# Patient Record
Sex: Male | Born: 1945 | Race: White | Hispanic: No | Smoking: Former smoker
Health system: Southern US, Community
[De-identification: ages and names within clinical notes are randomized; demographics above are authoritative.]

## PROBLEM LIST (undated history)

## (undated) ENCOUNTER — Emergency Department (HOSPITAL_COMMUNITY): Admission: EM | Payer: Medicare Other | Source: Home / Self Care

## (undated) DIAGNOSIS — J9601 Acute respiratory failure with hypoxia: Secondary | ICD-10-CM

## (undated) DIAGNOSIS — H269 Unspecified cataract: Secondary | ICD-10-CM

## (undated) DIAGNOSIS — F419 Anxiety disorder, unspecified: Secondary | ICD-10-CM

## (undated) DIAGNOSIS — K8020 Calculus of gallbladder without cholecystitis without obstruction: Secondary | ICD-10-CM

## (undated) DIAGNOSIS — E079 Disorder of thyroid, unspecified: Secondary | ICD-10-CM

## (undated) DIAGNOSIS — I509 Heart failure, unspecified: Secondary | ICD-10-CM

## (undated) DIAGNOSIS — H409 Unspecified glaucoma: Secondary | ICD-10-CM

## (undated) DIAGNOSIS — Z7901 Long term (current) use of anticoagulants: Secondary | ICD-10-CM

## (undated) DIAGNOSIS — K4090 Unilateral inguinal hernia, without obstruction or gangrene, not specified as recurrent: Secondary | ICD-10-CM

## (undated) DIAGNOSIS — I4891 Unspecified atrial fibrillation: Secondary | ICD-10-CM

## (undated) DIAGNOSIS — F32A Depression, unspecified: Secondary | ICD-10-CM

## (undated) DIAGNOSIS — E039 Hypothyroidism, unspecified: Secondary | ICD-10-CM

## (undated) DIAGNOSIS — E162 Hypoglycemia, unspecified: Secondary | ICD-10-CM

## (undated) DIAGNOSIS — F0390 Unspecified dementia without behavioral disturbance: Secondary | ICD-10-CM

## (undated) DIAGNOSIS — I10 Essential (primary) hypertension: Secondary | ICD-10-CM

## (undated) DIAGNOSIS — F329 Major depressive disorder, single episode, unspecified: Secondary | ICD-10-CM

## (undated) DIAGNOSIS — G4730 Sleep apnea, unspecified: Secondary | ICD-10-CM

## (undated) HISTORY — DX: Calculus of gallbladder without cholecystitis without obstruction: K80.20

## (undated) HISTORY — DX: Unspecified cataract: H26.9

## (undated) HISTORY — DX: Unspecified glaucoma: H40.9

## (undated) HISTORY — PX: FINGER AMPUTATION: SHX636

## (undated) HISTORY — DX: Sleep apnea, unspecified: G47.30

## (undated) HISTORY — DX: Hypoglycemia, unspecified: E16.2

---

## 2000-03-22 ENCOUNTER — Emergency Department (HOSPITAL_COMMUNITY): Admission: EM | Admit: 2000-03-22 | Discharge: 2000-03-22 | Payer: Self-pay | Admitting: Emergency Medicine

## 2000-04-04 ENCOUNTER — Encounter: Admission: RE | Admit: 2000-04-04 | Discharge: 2000-04-04 | Payer: Self-pay | Admitting: Internal Medicine

## 2000-08-12 ENCOUNTER — Encounter: Admission: RE | Admit: 2000-08-12 | Discharge: 2000-08-12 | Payer: Self-pay | Admitting: Internal Medicine

## 2001-04-11 ENCOUNTER — Emergency Department (HOSPITAL_COMMUNITY): Admission: EM | Admit: 2001-04-11 | Discharge: 2001-04-11 | Payer: Self-pay | Admitting: Emergency Medicine

## 2001-07-07 ENCOUNTER — Encounter: Payer: Self-pay | Admitting: Emergency Medicine

## 2001-07-08 ENCOUNTER — Inpatient Hospital Stay (HOSPITAL_COMMUNITY): Admission: EM | Admit: 2001-07-08 | Discharge: 2001-07-09 | Payer: Self-pay | Admitting: Emergency Medicine

## 2006-10-06 ENCOUNTER — Emergency Department (HOSPITAL_COMMUNITY): Admission: EM | Admit: 2006-10-06 | Discharge: 2006-10-06 | Payer: Self-pay | Admitting: *Deleted

## 2010-01-19 ENCOUNTER — Emergency Department (HOSPITAL_COMMUNITY): Admission: EM | Admit: 2010-01-19 | Discharge: 2010-01-19 | Payer: Self-pay | Admitting: Emergency Medicine

## 2010-01-19 ENCOUNTER — Ambulatory Visit: Payer: Self-pay | Admitting: Surgery

## 2010-01-25 ENCOUNTER — Emergency Department (HOSPITAL_COMMUNITY): Admission: EM | Admit: 2010-01-25 | Discharge: 2010-01-26 | Payer: Self-pay | Admitting: Emergency Medicine

## 2010-10-04 LAB — URINALYSIS, ROUTINE W REFLEX MICROSCOPIC
Bilirubin Urine: NEGATIVE
Glucose, UA: NEGATIVE mg/dL
Ketones, ur: NEGATIVE mg/dL
Nitrite: NEGATIVE
Protein, ur: NEGATIVE mg/dL

## 2010-10-04 LAB — DIFFERENTIAL
Basophils Relative: 0 % (ref 0–1)
Eosinophils Absolute: 0.2 10*3/uL (ref 0.0–0.7)
Eosinophils Relative: 3 % (ref 0–5)
Neutrophils Relative %: 75 % (ref 43–77)

## 2010-10-04 LAB — CBC
HCT: 43.2 % (ref 39.0–52.0)
Hemoglobin: 14.8 g/dL (ref 13.0–17.0)
MCH: 32.4 pg (ref 26.0–34.0)
MCHC: 34.2 g/dL (ref 30.0–36.0)
MCV: 94.7 fL (ref 78.0–100.0)
Platelets: 167 10*3/uL (ref 150–400)
RBC: 4.57 MIL/uL (ref 4.22–5.81)
RDW: 12.9 % (ref 11.5–15.5)
WBC: 7.6 10*3/uL (ref 4.0–10.5)

## 2010-10-04 LAB — COMPREHENSIVE METABOLIC PANEL
ALT: 14 U/L (ref 0–53)
AST: 17 U/L (ref 0–37)
Alkaline Phosphatase: 76 U/L (ref 39–117)
CO2: 30 mEq/L (ref 19–32)
Chloride: 102 mEq/L (ref 96–112)
GFR calc non Af Amer: >60 mL/min (ref >60)
Glucose, Bld: 93 mg/dL (ref 70–99)
Potassium: 4.4 mEq/L (ref 3.5–5.1)
Sodium: 138 mEq/L (ref 135–145)

## 2010-10-04 LAB — D-DIMER, QUANTITATIVE: D-Dimer, Quant: 0.97 ug/mL-FEU — ABNORMAL HIGH (ref 0.00–0.48)

## 2011-08-15 ENCOUNTER — Encounter (HOSPITAL_COMMUNITY): Payer: Self-pay | Admitting: *Deleted

## 2011-08-15 ENCOUNTER — Emergency Department (HOSPITAL_COMMUNITY)
Admission: EM | Admit: 2011-08-15 | Discharge: 2011-08-15 | Disposition: A | Discharge status: Home / Self Care | Payer: Medicare Other | Attending: Emergency Medicine | Admitting: Emergency Medicine

## 2011-08-15 DIAGNOSIS — S81009A Unspecified open wound, unspecified knee, initial encounter: Secondary | ICD-10-CM | POA: Insufficient documentation

## 2011-08-15 DIAGNOSIS — W540XXA Bitten by dog, initial encounter: Secondary | ICD-10-CM | POA: Insufficient documentation

## 2011-08-15 DIAGNOSIS — Z79899 Other long term (current) drug therapy: Secondary | ICD-10-CM | POA: Insufficient documentation

## 2011-08-15 DIAGNOSIS — S91009A Unspecified open wound, unspecified ankle, initial encounter: Secondary | ICD-10-CM | POA: Insufficient documentation

## 2011-08-15 DIAGNOSIS — T148XXA Other injury of unspecified body region, initial encounter: Secondary | ICD-10-CM

## 2011-08-15 DIAGNOSIS — I10 Essential (primary) hypertension: Secondary | ICD-10-CM | POA: Insufficient documentation

## 2011-08-15 HISTORY — DX: Essential (primary) hypertension: I10

## 2011-08-15 NOTE — ED Notes (Signed)
The pt  Was bitten by a dog last Wednesday.  He has some redness ot this llt lower leg.

## 2011-08-15 NOTE — ED Notes (Signed)
The pt says he wants to talk to a psychiatrist. He has anxiety issues and he  Does not like hospitals.  He is afraid that he is going to die from the dog bite

## 2011-08-15 NOTE — ED Notes (Signed)
[employer], [phone]  Pt called and left a message for them to call him tomorrow

## 2011-08-15 NOTE — ED Provider Notes (Signed)
History     CSN: [id number]  Arrival date & time 08/15/11  1014   First MD Initiated Contact with Patient 08/15/11 1041      Chief Complaint  Patient presents with  . Animal Bite    (Consider location/radiation/quality/duration/timing/severity/associated sxs/prior treatment) Patient is a 66 y.o. male presenting with animal bite. The history is provided by the patient.  Animal Bite  Pertinent negatives include no numbness, no nausea, no vomiting, no headaches and no weakness.  pt states was at neighbors yard/junk yard 4 days ago. He opened fence and went in. Says one of the dogs there had puppies, as he approached animals/in yard, the dog bite him above left ankle. He has been smearing a concoction of 'clay, and honey' on wound that 'an amish guy gave me'. Says area is healing, has a couple small scabs to area, but no spreading redness, no purulent drainage, no pain, no fever. Says he knows the dogs owners, and location of dog. That dog has been acting normally/healthy. States initially there was some uncertainty as to whether dog had is shots, but now has been reassured by owner that dog has had its shots.  Pt states his last tetanus shot was 3 yrs ago.   Past Medical History  Diagnosis Date  . Hypertension   . Anxiety     History reviewed. No pertinent past surgical history.  No family history on file.  History  Substance Use Topics  . Smoking status: Current Everyday Smoker  . Smokeless tobacco: Not on file  . Alcohol Use: No      Review of Systems  Constitutional: Negative for fever and chills.  Gastrointestinal: Negative for nausea and vomiting.  Musculoskeletal: Negative for myalgias.  Skin: Negative for rash.  Neurological: Negative for weakness, numbness and headaches.    Allergies  Review of patient's allergies indicates no known allergies.  Home Medications   Current Outpatient Rx  Name Route Sig Dispense Refill  . AMLODIPINE BESYLATE 5 MG PO TABS Oral  Take 5 mg by mouth daily.    Marland Kitchen DIAZEPAM 5 MG PO TABS Oral Take 5 mg by mouth every 6 (six) hours as needed.    Marland Kitchen LEVOTHYROXINE SODIUM 137 MCG PO TABS Oral Take 137 mcg by mouth daily.    Marland Kitchen PHENELZINE SULFATE 15 MG PO TABS Oral Take 15 mg by mouth 3 (three) times daily.      BP 135/86  Pulse 109  Temp(Src) 98.1 F (36.7 C) (Oral)  Resp 20  SpO2 94%  Physical Exam  Nursing note and vitals reviewed. Constitutional: He is oriented to person, place, and time. He appears well-developed and well-nourished. No distress.  HENT:  Head: Atraumatic.  Eyes: Pupils are equal, round, and reactive to light.  Neck: Neck supple. No tracheal deviation present.  Cardiovascular: Normal rate.   Pulmonary/Chest: Effort normal. No accessory muscle usage. No respiratory distress.  Musculoskeletal: Normal range of motion. He exhibits no edema.       Couple small (2-3 mm diameter each) scabs to right left lower leg just above ankle posteriorly. No purulent drainage. No cellulitis. No tenderness to area. No sts.   Neurological: He is alert and oriented to person, place, and time.       Motor intact. Steady gait  Skin: Skin is warm and dry.  Psychiatric: He has a normal mood and affect.    ED Course  Procedures (including critical care time)     MDM  Pt states he will  contact animal control this morning, that the location of dog and owner are known. Also states animal has been healthy/acting normally.  4 days after bite, no sign of infection to bite site, therefore will hold on abx rx for now. Discussed signs of infxn w pt.          Suzi Roots, MD 08/15/11 1116

## 2011-08-16 ENCOUNTER — Ambulatory Visit (HOSPITAL_COMMUNITY): Admission: RE | Admit: 2011-08-16 | Payer: Medicare Other | Source: Home / Self Care | Admitting: Psychiatry

## 2011-08-17 ENCOUNTER — Encounter (HOSPITAL_COMMUNITY): Payer: Self-pay | Admitting: *Deleted

## 2011-08-17 ENCOUNTER — Emergency Department (HOSPITAL_COMMUNITY)
Admission: EM | Admit: 2011-08-17 | Discharge: 2011-08-18 | Disposition: A | Discharge status: Home / Self Care | Payer: Medicare Other | Attending: Emergency Medicine | Admitting: Emergency Medicine

## 2011-08-17 DIAGNOSIS — F411 Generalized anxiety disorder: Secondary | ICD-10-CM | POA: Insufficient documentation

## 2011-08-17 DIAGNOSIS — F3289 Other specified depressive episodes: Secondary | ICD-10-CM | POA: Insufficient documentation

## 2011-08-17 DIAGNOSIS — T148XXA Other injury of unspecified body region, initial encounter: Secondary | ICD-10-CM

## 2011-08-17 DIAGNOSIS — F329 Major depressive disorder, single episode, unspecified: Secondary | ICD-10-CM | POA: Insufficient documentation

## 2011-08-17 DIAGNOSIS — W540XXA Bitten by dog, initial encounter: Secondary | ICD-10-CM | POA: Insufficient documentation

## 2011-08-17 DIAGNOSIS — S81009A Unspecified open wound, unspecified knee, initial encounter: Secondary | ICD-10-CM | POA: Insufficient documentation

## 2011-08-17 DIAGNOSIS — S91009A Unspecified open wound, unspecified ankle, initial encounter: Secondary | ICD-10-CM | POA: Insufficient documentation

## 2011-08-17 DIAGNOSIS — I10 Essential (primary) hypertension: Secondary | ICD-10-CM | POA: Insufficient documentation

## 2011-08-17 DIAGNOSIS — Z008 Encounter for other general examination: Secondary | ICD-10-CM | POA: Insufficient documentation

## 2011-08-17 HISTORY — DX: Anxiety disorder, unspecified: F41.9

## 2011-08-17 HISTORY — DX: Major depressive disorder, single episode, unspecified: F32.9

## 2011-08-17 HISTORY — DX: Depression, unspecified: F32.A

## 2011-08-17 LAB — RAPID URINE DRUG SCREEN, HOSP PERFORMED
Amphetamines: NOT DETECTED
Barbiturates: NOT DETECTED
Benzodiazepines: POSITIVE — AB
Cocaine: NOT DETECTED
Opiates: NOT DETECTED
Tetrahydrocannabinol: NOT DETECTED

## 2011-08-17 LAB — URINALYSIS, ROUTINE W REFLEX MICROSCOPIC
Bilirubin Urine: NEGATIVE
Glucose, UA: NEGATIVE mg/dL
Hgb urine dipstick: NEGATIVE
Ketones, ur: NEGATIVE mg/dL
Leukocytes, UA: NEGATIVE
Nitrite: NEGATIVE
Protein, ur: NEGATIVE mg/dL
Specific Gravity, Urine: 1.014 (ref 1.005–1.030)
Urobilinogen, UA: 0.2 mg/dL (ref 0.0–1.0)
pH: 5.5 (ref 5.0–8.0)

## 2011-08-17 LAB — COMPREHENSIVE METABOLIC PANEL
ALT: 15 U/L (ref 0–53)
AST: 22 U/L (ref 0–37)
Albumin: 4 g/dL (ref 3.5–5.2)
Alkaline Phosphatase: 79 U/L (ref 39–117)
BUN: 13 mg/dL (ref 6–23)
CO2: 28 mEq/L (ref 19–32)
Calcium: 9.3 mg/dL (ref 8.4–10.5)
Chloride: 97 mEq/L (ref 96–112)
Creatinine, Ser: 0.92 mg/dL (ref 0.50–1.35)
GFR calc Af Amer: >90 mL/min (ref >90)
GFR calc non Af Amer: 87 mL/min — ABNORMAL LOW (ref >90)
Glucose, Bld: 93 mg/dL (ref 70–99)
Potassium: 3.8 mEq/L (ref 3.5–5.1)
Sodium: 134 mEq/L — ABNORMAL LOW (ref 135–145)
Total Bilirubin: 0.7 mg/dL (ref 0.3–1.2)
Total Protein: 7.3 g/dL (ref 6.0–8.3)

## 2011-08-17 LAB — DIFFERENTIAL
Basophils Absolute: 0 10*3/uL (ref 0.0–0.1)
Basophils Relative: 0 % (ref 0–1)
Eosinophils Absolute: 0.1 10*3/uL (ref 0.0–0.7)
Eosinophils Relative: 2 % (ref 0–5)
Lymphocytes Relative: 19 % (ref 12–46)
Lymphs Abs: 1.3 10*3/uL (ref 0.7–4.0)
Monocytes Absolute: 0.7 10*3/uL (ref 0.1–1.0)
Monocytes Relative: 10 % (ref 3–12)
Neutro Abs: 4.8 10*3/uL (ref 1.7–7.7)
Neutrophils Relative %: 70 % (ref 43–77)

## 2011-08-17 LAB — CBC
HCT: 41.4 % (ref 39.0–52.0)
Hemoglobin: 14 g/dL (ref 13.0–17.0)
MCH: 30 pg (ref 26.0–34.0)
MCHC: 33.8 g/dL (ref 30.0–36.0)
MCV: 88.7 fL (ref 78.0–100.0)
Platelets: 169 10*3/uL (ref 150–400)
RBC: 4.67 MIL/uL (ref 4.22–5.81)
RDW: 12.5 % (ref 11.5–15.5)
WBC: 6.8 10*3/uL (ref 4.0–10.5)

## 2011-08-17 LAB — ETHANOL: Alcohol, Ethyl (B): <11 mg/dL (ref 0–11)

## 2011-08-17 MED ORDER — AMOXICILLIN-POT CLAVULANATE 875-125 MG PO TABS
1.0000 | ORAL_TABLET | Freq: Once | ORAL | Status: AC
  Administered 2011-08-17: 1 via ORAL
  Filled 2011-08-17: qty 1

## 2011-08-17 NOTE — ED Notes (Signed)
Pt states, "I wonder if I am dying. I can't feel my pulse. I just need some help."

## 2011-08-17 NOTE — ED Notes (Signed)
Pt dressing on left lower leg where he states he was bitten by a dog, states that "an Amish man has been putting stuff on it every day" - pt states he does not know what the substance is

## 2011-08-17 NOTE — ED Notes (Signed)
The pt has multiple complaints.  The pt has been seen here every day at one of our facilities  For the past 4 days.   .  Numbness all over his body after a dog bite 4 days ago and he is not sleeping etc..  He says he has anxiety

## 2011-08-17 NOTE — ED Notes (Signed)
Pt denies HI/SI.

## 2011-08-18 MED ORDER — BACITRACIN 500 UNIT/GM EX OINT
1.0000 "application " | TOPICAL_OINTMENT | CUTANEOUS | Status: AC
  Administered 2011-08-18: 1 via TOPICAL
  Filled 2011-08-18: qty 0.9

## 2011-08-18 MED ORDER — AMOXICILLIN-POT CLAVULANATE 875-125 MG PO TABS: 1.0000 | ORAL_TABLET | Freq: Two times a day (BID) | ORAL | Status: AC

## 2011-08-18 NOTE — ED Notes (Signed)
ACT team speaking with pt 
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
## 2011-08-22 NOTE — ED Provider Notes (Signed)
History     CSN: [id number]  Arrival date & time 08/17/11  1550   First MD Initiated Contact with Patient 08/17/11 2025      Chief Complaint  Patient presents with  . Psychiatric Evaluation    HPI: Patient is a 66 y.o. male presenting with mental health disorder. The history is provided by the patient.  Mental Health Problem The primary symptoms do not include dysphoric mood, delusions or hallucinations. The current episode started this week. This is a recurrent problem.  The onset of the illness is precipitated by a stressful event. The degree of incapacity that he is experiencing as a consequence of his illness is moderate. Additional symptoms of the illness do not include no insomnia. He does not admit to suicidal ideas. He does not have a plan to commit suicide. He does not contemplate harming himself. He has not already injured self. He does not contemplate injuring another person. He has not already  injured another person. Risk factors that are present for mental illness include a history of mental illness.  Pt reports that he was bitten by a dog 4 days ago. Since that time he has been very anxious and concerned that he was going to die. States he did not get an antibiotic for the bite or rabies "shots'. States he is fearful that the owners of the dogs are angry w/ him for "turning in" the dog. States he is fearful of being alone, afraid he might die. States he will not stay home alone because he is so fearful. Pt states he needs help. States he is very anxious and needs to talk to someone. Denies SI/HI.  Past Medical History  Diagnosis Date  . Hypertension   . Anxiety   . Anxiety and depression     History reviewed. No pertinent past surgical history.  History reviewed. No pertinent family history.  History  Substance Use Topics  . Smoking status: Current Everyday Smoker  . Smokeless tobacco: Not on file  . Alcohol Use: No      Review of Systems  Constitutional:  Negative.   HENT: Negative.   Eyes: Negative.   Respiratory: Negative.   Cardiovascular: Negative.   Gastrointestinal: Negative.   Genitourinary: Negative.   Musculoskeletal: Negative.   Skin: Negative.   Neurological: Negative.   Hematological: Negative.   Psychiatric/Behavioral: Negative.  Negative for hallucinations and dysphoric mood. The patient does not have insomnia.     Allergies  Review of patient's allergies indicates no known allergies.  Home Medications   Current Outpatient Rx  Name Route Sig Dispense Refill  . AMLODIPINE BESYLATE 5 MG PO TABS Oral Take 5 mg by mouth daily.    Marland Kitchen BIMATOPROST 0.01 % OP SOLN Both Eyes Place 1 drop into both eyes at bedtime.    Marland Kitchen DIAZEPAM 5 MG PO TABS Oral Take 5 mg by mouth every 6 (six) hours as needed. For anxiety    . LEVOTHYROXINE SODIUM 137 MCG PO TABS Oral Take 137 mcg by mouth daily.    Marland Kitchen PHENELZINE SULFATE 15 MG PO TABS Oral Take 30 mg by mouth 3 (three) times daily.     . AMOXICILLIN-POT CLAVULANATE 875-125 MG PO TABS Oral Take 1 tablet by mouth 2 (two) times daily. 14 tablet 0    BP 119/83  Pulse 78  Temp(Src) 97.8 F (36.6 C) (Oral)  Resp 18  SpO2 91%  Physical Exam  Constitutional: He appears well-developed and well-nourished.  HENT:  Head: Normocephalic and  atraumatic.  Eyes: Conjunctivae are normal.  Neck: Neck supple.  Cardiovascular: Normal rate and regular rhythm.   Pulmonary/Chest: Effort normal and breath sounds normal.  Musculoskeletal: Normal range of motion.  Neurological: He is alert.  Skin: Skin is warm and dry.       Multiple (3 to4) puncture wounds to posterior (L) ankle, each w/ mildly erythematous borders and swelling. TTP w/o drainage.  Psychiatric: His mood appears anxious. His speech is tangential. He expresses no suicidal plans and no homicidal plans. He exhibits abnormal recent memory. He exhibits normal remote memory.       Tearful at times.    ED Course  Procedures   Will proceed w/  medical clearance and consult Act Team regarding pt evaluation. Review of the records and pt statement supports that   2245: Pt is medically cleared. Will start on Augmentin for mild inflammatory changes of wounds to (L) posterior ankle s/p dogbite. Will consult w/ Act Team regarding pt eval.  0130:  Marcus w/ Act Team reports he has evaluated pt and feels pt does not meet criteria for inpt placement. Additionally he states pt was not agreeable w/ a plan that places him where he can not come and go at will. After some discussion pt has agreed to contact [facility] tomorrow to arrange "intensive"  outpatient therapy.   Labs Reviewed  COMPREHENSIVE METABOLIC PANEL - Abnormal; Notable for the following:    Sodium 134 (*)    GFR calc non Af Amer 87 (*)    All other components within normal limits  URINE RAPID DRUG SCREEN (HOSP PERFORMED) - Abnormal; Notable for the following:    Benzodiazepines POSITIVE (*)    All other components within normal limits  CBC  DIFFERENTIAL  URINALYSIS, ROUTINE W REFLEX MICROSCOPIC  ETHANOL  LAB REPORT - SCANNED   No results found.   1. Animal bite       MDM  HPI/PE and clinical findings support medical clearance for evaluation for 1.  Anxiety/panic/depression (Denies SI/HI) 2.  Early wound infection          Leanne Chang, NP 08/22/11 [phone]

## 2011-08-27 NOTE — ED Provider Notes (Signed)
Medical screening examination/treatment/procedure(s) were performed by non-physician practitioner and as supervising physician I was immediately available for consultation/collaboration.  Marthena Whitmyer, MD 08/27/11 0103 

## 2012-07-03 ENCOUNTER — Other Ambulatory Visit (HOSPITAL_COMMUNITY): Payer: Self-pay | Admitting: Family Medicine

## 2012-07-03 ENCOUNTER — Ambulatory Visit (HOSPITAL_COMMUNITY)
Admission: RE | Admit: 2012-07-03 | Discharge: 2012-07-03 | Disposition: A | Discharge status: Home / Self Care | Payer: Medicare Other | Source: Ambulatory Visit | Attending: Family Medicine | Admitting: Family Medicine

## 2012-07-03 DIAGNOSIS — M7989 Other specified soft tissue disorders: Secondary | ICD-10-CM

## 2012-07-03 DIAGNOSIS — R600 Localized edema: Secondary | ICD-10-CM

## 2012-07-03 NOTE — Progress Notes (Signed)
VASCULAR LAB PRELIMINARY  PRELIMINARY  PRELIMINARY  PRELIMINARY   [name], Eric  01-22-1946   Right lower extremity venous duplex completed.    Preliminary report:  Right:  No evidence of DVT, superficial thrombosis, or Baker's cyst.  Soila Printup, RVT 07/03/2012, 3:38 PM

## 2012-08-06 ENCOUNTER — Encounter (HOSPITAL_COMMUNITY): Payer: Self-pay | Admitting: Emergency Medicine

## 2012-08-06 DIAGNOSIS — M7989 Other specified soft tissue disorders: Secondary | ICD-10-CM | POA: Insufficient documentation

## 2012-08-06 DIAGNOSIS — Z79899 Other long term (current) drug therapy: Secondary | ICD-10-CM | POA: Insufficient documentation

## 2012-08-06 DIAGNOSIS — F341 Dysthymic disorder: Secondary | ICD-10-CM | POA: Insufficient documentation

## 2012-08-06 DIAGNOSIS — M79609 Pain in unspecified limb: Secondary | ICD-10-CM | POA: Insufficient documentation

## 2012-08-06 DIAGNOSIS — I10 Essential (primary) hypertension: Secondary | ICD-10-CM | POA: Insufficient documentation

## 2012-08-06 DIAGNOSIS — Z87891 Personal history of nicotine dependence: Secondary | ICD-10-CM | POA: Insufficient documentation

## 2012-08-06 NOTE — ED Notes (Signed)
C/o redness, swelling, and pain to R lower leg x 2-3 weeks.  Pt states he recently had test for R leg DVT at [facility] and it was negative.  Denies sob.  Ambulatory to triage.

## 2012-08-07 ENCOUNTER — Emergency Department (HOSPITAL_COMMUNITY)
Admission: EM | Admit: 2012-08-07 | Discharge: 2012-08-07 | Discharge status: Against Medical Advice | Payer: Medicare Other | Attending: Emergency Medicine | Admitting: Emergency Medicine

## 2012-08-07 DIAGNOSIS — M7989 Other specified soft tissue disorders: Secondary | ICD-10-CM

## 2012-08-07 NOTE — ED Provider Notes (Signed)
History     CSN: [id number]  Arrival date & time 08/06/12  2317   First MD Initiated Contact with Patient 08/07/12 0033      Chief Complaint  Patient presents with  . Leg Swelling    (Consider location/radiation/quality/duration/timing/severity/associated sxs/prior treatment) HPI Comments: Patient presents with leg swelling, on my evaluation the patient refuses to take off his pants, states that he is tired of waiting and wants to go home. He refuses to let me examine him.   Past Medical History  Diagnosis Date  . Hypertension   . Anxiety   . Anxiety and depression     History reviewed. No pertinent past surgical history.  No family history on file.  History  Substance Use Topics  . Smoking status: Former Games developer  . Smokeless tobacco: Former Neurosurgeon    Quit date: 10/22/2010  . Alcohol Use: No      Review of Systems  Musculoskeletal:       Leg swelling    Allergies  Demerol  Home Medications   Current Outpatient Rx  Name  Route  Sig  Dispense  Refill  . AMLODIPINE BESYLATE 5 MG PO TABS   Oral   Take 5 mg by mouth daily.         Marland Kitchen BIMATOPROST 0.01 % OP SOLN   Both Eyes   Place 1 drop into both eyes at bedtime.         Marland Kitchen DIAZEPAM 5 MG PO TABS   Oral   Take 5 mg by mouth every 6 (six) hours as needed. For anxiety         . DOXYCYCLINE HYCLATE 100 MG PO TABS   Oral   Take 100 mg by mouth 2 (two) times daily.         . FUROSEMIDE 20 MG PO TABS   Oral   Take 2,040 mg by mouth 2 (two) times daily as needed. For edema         . HYDROCODONE-ACETAMINOPHEN 7.5-325 MG PO TABS   Oral   Take 1 tablet by mouth every 6 (six) hours as needed. For pain         . LEVOTHYROXINE SODIUM 137 MCG PO TABS   Oral   Take 137 mcg by mouth daily.         Marland Kitchen PHENELZINE SULFATE 15 MG PO TABS   Oral   Take 30 mg by mouth 3 (three) times daily.            BP 144/88  Pulse 114  Temp 98.7 F (37.1 C) (Oral)  Resp 18  SpO2 92%  Physical Exam    Constitutional: He appears well-developed and well-nourished. No distress.  HENT:  Head: Normocephalic and atraumatic.    ED Course  Procedures (including critical care time)  Labs Reviewed - No data to display No results found.   1. Leg swelling       MDM  The patient decided to leave before I could perform a physical exam. He did not appear in distress, was able to ambulate without difficulty. Review of his vital signs revealed that he had a slight hypoxia as well as a mild tachycardia. I informed the patient that he needed to be further evaluated for other potentially dangerous conditions such as DVT and pulmonary embolism but the patient refused this evaluation. He seemed to have a clear mental status was able to make that decision. The patient and decision making capacity, left AGAINST MEDICAL ADVICE.  Vida Roller, MD 08/07/12 [phone]

## 2012-08-23 ENCOUNTER — Encounter (HOSPITAL_COMMUNITY): Payer: Self-pay | Admitting: *Deleted

## 2012-08-23 ENCOUNTER — Emergency Department (HOSPITAL_COMMUNITY)
Admission: EM | Admit: 2012-08-23 | Discharge: 2012-08-23 | Discharge status: Against Medical Advice | Payer: Medicare Other | Attending: Emergency Medicine | Admitting: Emergency Medicine

## 2012-08-23 DIAGNOSIS — R000 Tachycardia, unspecified: Secondary | ICD-10-CM | POA: Insufficient documentation

## 2012-08-23 DIAGNOSIS — I10 Essential (primary) hypertension: Secondary | ICD-10-CM | POA: Insufficient documentation

## 2012-08-23 DIAGNOSIS — R0602 Shortness of breath: Secondary | ICD-10-CM | POA: Insufficient documentation

## 2012-08-23 DIAGNOSIS — Z4789 Encounter for other orthopedic aftercare: Secondary | ICD-10-CM

## 2012-08-23 DIAGNOSIS — F329 Major depressive disorder, single episode, unspecified: Secondary | ICD-10-CM | POA: Insufficient documentation

## 2012-08-23 DIAGNOSIS — F600 Paranoid personality disorder: Secondary | ICD-10-CM | POA: Insufficient documentation

## 2012-08-23 DIAGNOSIS — R071 Chest pain on breathing: Secondary | ICD-10-CM | POA: Insufficient documentation

## 2012-08-23 DIAGNOSIS — Z87891 Personal history of nicotine dependence: Secondary | ICD-10-CM | POA: Insufficient documentation

## 2012-08-23 DIAGNOSIS — F3289 Other specified depressive episodes: Secondary | ICD-10-CM | POA: Insufficient documentation

## 2012-08-23 DIAGNOSIS — F411 Generalized anxiety disorder: Secondary | ICD-10-CM | POA: Insufficient documentation

## 2012-08-23 DIAGNOSIS — Z4689 Encounter for fitting and adjustment of other specified devices: Secondary | ICD-10-CM | POA: Insufficient documentation

## 2012-08-23 DIAGNOSIS — R0789 Other chest pain: Secondary | ICD-10-CM

## 2012-08-23 LAB — GLUCOSE, CAPILLARY: Glucose-Capillary: 171 mg/dL — ABNORMAL HIGH (ref 70–99)

## 2012-08-23 NOTE — ED Notes (Signed)
Pt stating he wants to leave. Pt encouraged to stay for complete evaluation. Pt informed of the delay and again encouraged to stay. Pt continued to deny any further treatment. Dr. Dia Crawford notified of pt wanting to leave AMA. Pt verbally stated he understood the risk for leaving AMA. Pt was read AMA disclosure and e-signed AMA discharge.

## 2012-08-23 NOTE — ED Notes (Signed)
Pt is here anxious and saying blood sugar is low and diaphoretic.  Blood sugar is 171.  Pt states had cast placed to right arm today and now with severe pain to right arm and has good cap refill but has discoloration to hand.  Pt is paranoid.  Pt denies short of breath but states he has panic attacks.

## 2012-08-23 NOTE — ED Provider Notes (Signed)
History     CSN: [id number]  Arrival date & time 08/23/12  1910   First MD Initiated Contact with Patient 08/23/12 1933      Chief Complaint  Patient presents with  . Hypoglycemia  . cast tight on right arm     (Consider location/radiation/quality/duration/timing/severity/associated sxs/prior treatment) HPI Comments: 67 y/o male p/w concerns of right cast pain. S/p fall about one week ago. Initially in splint. Placed in cast by orthopedics about 12 hours ago. Patient states he feels the cast is to tight. Feels very anxious about this.   Patient is a 67 y.o. male presenting with general illness. The history is provided by the patient.  Illness  The current episode started today. The onset was gradual. The problem occurs continuously. The problem has been gradually worsening. The problem is moderate. Nothing relieves the symptoms. Nothing aggravates the symptoms. Pertinent negatives include no fever, no abdominal pain, no diarrhea, no nausea, no vomiting, no congestion, no headaches, no rhinorrhea, no cough, no rash and no eye pain.    Past Medical History  Diagnosis Date  . Hypertension   . Anxiety   . Anxiety and depression     History reviewed. No pertinent past surgical history.  No family history on file.  History  Substance Use Topics  . Smoking status: Former Games developer  . Smokeless tobacco: Former Neurosurgeon    Quit date: 10/22/2010  . Alcohol Use: No      Review of Systems  Constitutional: Negative for fever and chills.  HENT: Negative for congestion and rhinorrhea.   Eyes: Negative for pain and visual disturbance.  Respiratory: Positive for shortness of breath (mild, since fall). Negative for cough.   Cardiovascular: Positive for chest pain (right chest wall pain since fall). Negative for leg swelling.  Gastrointestinal: Negative for nausea, vomiting, abdominal pain and diarrhea.  Genitourinary: Negative for flank pain and difficulty urinating.  Musculoskeletal:  Negative for back pain.  Skin: Negative for color change and rash.  Neurological: Negative for dizziness and headaches.  All other systems reviewed and are negative.    Allergies  Demerol; Epinephrine; and Morphine and related  Home Medications   Current Outpatient Rx  Name  Route  Sig  Dispense  Refill  . AMLODIPINE BESYLATE 5 MG PO TABS   Oral   Take 5 mg by mouth daily.         Marland Kitchen BIMATOPROST 0.01 % OP SOLN   Both Eyes   Place 1 drop into both eyes at bedtime.         Marland Kitchen VITAMIN B 12 PO   Oral   Take 1 tablet by mouth daily.         Marland Kitchen DIAZEPAM 5 MG PO TABS   Oral   Take 5 mg by mouth every 6 (six) hours as needed. For anxiety         . FUROSEMIDE 20 MG PO TABS   Oral   Take 40-80 mg by mouth 2 (two) times daily as needed. For edema         . HYDROCODONE-ACETAMINOPHEN 7.5-325 MG PO TABS   Oral   Take 1 tablet by mouth every 6 (six) hours as needed. For pain         . LEVOTHYROXINE SODIUM 137 MCG PO TABS   Oral   Take 137 mcg by mouth daily.         Marland Kitchen PHENELZINE SULFATE 15 MG PO TABS   Oral   Take 30  mg by mouth 3 (three) times daily.            BP 156/75  Pulse 130  Temp 98.3 F (36.8 C)  Resp 26  SpO2 95%  Physical Exam  Nursing note and vitals reviewed. Constitutional: He is oriented to person, place, and time. He appears well-developed and well-nourished. No distress.  HENT:  Head: Normocephalic and atraumatic.  Eyes: Conjunctivae normal and EOM are normal. Pupils are equal, round, and reactive to light. Right eye exhibits no discharge. Left eye exhibits no discharge.  Neck: No tracheal deviation present.  Cardiovascular: Regular rhythm, normal heart sounds and intact distal pulses.        tachycardic  Pulmonary/Chest: Effort normal and breath sounds normal. No stridor. No respiratory distress. He has no wheezes. He has no rales. He exhibits tenderness (right lateral chest wall).  Abdominal: Soft. He exhibits no distension. There is  no tenderness. There is no guarding.  Musculoskeletal: He exhibits no edema and no tenderness.       Cast to right forearm. Some duskiness to fingers distally. However, similar to color of fingers on left hand. Bruising to knuckles on right. Good cap refill. Sensation intact. Motor intact. Able to put one finger underneath cast.  Neurological: He is alert and oriented to person, place, and time.  Skin: Skin is warm and dry.  Psychiatric: He has a normal mood and affect. His behavior is normal.    ED Course  Procedures (including critical care time)  Labs Reviewed  GLUCOSE, CAPILLARY - Abnormal; Notable for the following:    Glucose-Capillary 171 (*)     All other components within normal limits   No results found.   1. Cast discomfort   2. Tachycardia   3. Chest wall pain   4. SOB (shortness of breath)      Date: 08/23/2012  Rate: 115  Rhythm: sinus tachycardia  QRS Axis: normal  Intervals: normal  ST/T Wave abnormalities: nonspecific T wave changes  Conduction Disutrbances:none  Narrative Interpretation:   Old EKG Reviewed: changes noted. New tachycardia.    MDM    67 y/o male p/w cast problem. Motor and sensation intact. Equal color to b/l hands. Plan for plain films and change of cast. Patient also with some SOB and chest wall pain since fall. Tachycardic. Discussed concern over symptoms and tachycardia. Patient states we are not doing anything to help him. Adamant that he wants to leave. Discussed desire to further evaluate and treat patient's symptoms. Refusing to stay. Signed AMA paperwork.  Labs and imaging reviewed by myself and considered in medical decision making if ordered. Imaging interpreted by radiology.   Discussed case with Dr. Rubin Payor who is in agreement with assessment and plan.         Stevie Kern, MD 08/24/12 [phone]

## 2012-08-24 NOTE — ED Provider Notes (Signed)
Patient with tight cast on arm. Reportedly was placed at [facility] today. States it is pain is gotten more bruised he doesn't like the cast. States it is too tight. He also states she's been having trouble breathing. He also states he's had cut on his head. After patient was told of the workup patient did not want to stay. He was also told that we would still loosen the cast. His did not want to stay for that either. He was informed of the risks and left AMA  Juliet Rude. Rubin Payor, MD 08/24/12 [phone]

## 2012-12-27 NOTE — Progress Notes (Signed)
Talked with dr Gelene Mink about pt being on an MAO inh-nardil-said he would need to be off this 2 weeks to be done here-if not-needs to be done at [facility] would have to get his psychiatrist to decide- Eric [name] at dr Jerl Santos office notified.

## 2012-12-27 NOTE — H&P (Signed)
Eric [name] is an 67 y.o. male.   Chief Complaint: Right hand numbness and tingling. HPI: Eric [name] continues with numbness and tingling in his right hand and fingers.  These symptoms are constant at this point.  He is several months since his radius fracture.  A recent nerve conduction study on 12/06/12 by Dr. Regino Schultze shows severe right carpal tunnel syndrome we had discussed proceeding with a right carpal tunnel release to help alleviate his symptoms.  Past Medical History  Diagnosis Date  . Hypertension   . Anxiety   . Anxiety and depression     No past surgical history on file.  No family history on file. Social History:  reports that he has quit smoking. He quit smokeless tobacco use about 2 years ago. He reports that he does not drink alcohol or use illicit drugs.  Allergies:  Allergies  Allergen Reactions  . Demerol (Meperidine) Other (See Comments)    Reacts with nardil  . Epinephrine   . Morphine And Related     No prescriptions prior to admission    No results found for this or any previous visit (from the past 48 hour(s)). No results found.  Review of Systems  Constitutional: Negative.   HENT: Negative.   Eyes: Negative.   Respiratory: Negative.   Cardiovascular: Negative.   Gastrointestinal: Negative.   Genitourinary: Negative.   Musculoskeletal: Positive for joint pain.  Skin: Negative.   Neurological: Positive for tingling.  Endo/Heme/Allergies: Negative.   Psychiatric/Behavioral: Positive for depression.    There were no vitals taken for this visit. Physical Exam  Constitutional: He is oriented to person, place, and time. He appears well-nourished.  HENT:  Head: Normocephalic.  Eyes: Pupils are equal, round, and reactive to light.  Neck: Normal range of motion.  Cardiovascular: Normal rate.   Respiratory: Effort normal.  GI: Soft. Bowel sounds are normal.  Musculoskeletal:  Right hand and wrist exam: Right wrist minimal pain with motion.  He is missing  parts of 2 fingers.  He does have subjective numbness in the median nerve distribution and fails 2 point discrimination at 10 mm.  Neurological: He is oriented to person, place, and time.  Skin: Skin is dry.  Psychiatric: He has a normal mood and affect.     Assessment/Plan Assessment: Right severe carpal tunnel release by nerve conduction study.  Healed right distal radius fracture Plan: Eric [name] has severe right carpal tunnel syndrome by nerve conduction study.  We have discussed proceeding with a carpal tunnel release to help alleviate his symptoms and improve his function.  He has some concerns about one of the medications he is on, Nardil, and would like to have a anesthesia consult preoperatively.  Eric [name] R 12/27/2012, 10:46 AM

## 2013-01-02 ENCOUNTER — Encounter (HOSPITAL_BASED_OUTPATIENT_CLINIC_OR_DEPARTMENT_OTHER): Payer: Self-pay

## 2013-01-02 ENCOUNTER — Ambulatory Visit (HOSPITAL_BASED_OUTPATIENT_CLINIC_OR_DEPARTMENT_OTHER): Admit: 2013-01-02 | Payer: Medicare Other | Admitting: Orthopaedic Surgery

## 2013-01-02 SURGERY — CARPAL TUNNEL RELEASE
Anesthesia: Choice | Laterality: Right

## 2013-08-24 ENCOUNTER — Ambulatory Visit (INDEPENDENT_AMBULATORY_CARE_PROVIDER_SITE_OTHER): Payer: Medicare Other | Admitting: Podiatry

## 2013-08-24 ENCOUNTER — Encounter: Payer: Self-pay | Admitting: Podiatry

## 2013-08-24 ENCOUNTER — Ambulatory Visit (INDEPENDENT_AMBULATORY_CARE_PROVIDER_SITE_OTHER): Payer: Medicare Other

## 2013-08-24 VITALS — BP 131/79 | HR 89 | Resp 16 | Ht 67.0 in | Wt 210.0 lb

## 2013-08-24 DIAGNOSIS — M79609 Pain in unspecified limb: Secondary | ICD-10-CM

## 2013-08-24 DIAGNOSIS — M79673 Pain in unspecified foot: Secondary | ICD-10-CM

## 2013-08-24 DIAGNOSIS — M7750 Other enthesopathy of unspecified foot: Secondary | ICD-10-CM

## 2013-08-24 DIAGNOSIS — G5790 Unspecified mononeuropathy of unspecified lower limb: Secondary | ICD-10-CM

## 2013-08-24 DIAGNOSIS — B351 Tinea unguium: Secondary | ICD-10-CM

## 2013-08-24 NOTE — Progress Notes (Signed)
Subjective:     Patient ID: Eric [name], male   DOB: 1945-08-30, 68 y.o.   MRN: [id number]  Foot Pain   patient presents stating that both of his feet did not and that his nails are very elongated and he cannot cut them due to than being thick and painful. Does not remember any specific reason for the numbness and some discomfort that occurs in his feet  Review of Systems  All other systems reviewed and are negative.       Objective:   Physical Exam  Nursing note and vitals reviewed. Constitutional: He is oriented to person, place, and time.  Cardiovascular: Intact distal pulses.   Musculoskeletal: Normal range of motion.  Neurological: He is oriented to person, place, and time.  Skin: Skin is dry.   vascular status was checked and intact neurological was diminished sharp dull and vibratory. 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 Discussed there is no good treatments for his type of numbness and I have recommended that he just try to keep himself in good condition. I did debridement nailbeds 1-5 both feet which have been bothering him at this time with no bleeding noted

## 2013-08-24 NOTE — Progress Notes (Signed)
   Subjective:    Patient ID: Eric [name], male    DOB: 1946/05/26, 68 y.o.   MRN: [id number]  HPI Comments: N numb L b/l foot all over D 10 years O slowly C worse A can not bend toes T 0  Foot Pain Associated symptoms include fatigue, nausea and numbness.      Review of Systems  Constitutional: Positive for appetite change and fatigue.  HENT: Positive for trouble swallowing.        Sinus problems  Eyes: Positive for visual disturbance.  Respiratory: Positive for wheezing.   Cardiovascular: Positive for leg swelling.  Gastrointestinal: Positive for nausea.  Endocrine: Positive for cold intolerance.       Increase urination  Genitourinary: Positive for frequency.  Musculoskeletal: Positive for back pain.       Joint pain  Skin: Positive for color change.       Change in nails  Allergic/Immunologic: Positive for food allergies.  Neurological: Positive for numbness.  Hematological: Bruises/bleeds easily.  Psychiatric/Behavioral: The patient is nervous/anxious.        Objective:   Physical Exam        Assessment & Plan:

## 2015-01-23 ENCOUNTER — Other Ambulatory Visit (HOSPITAL_COMMUNITY): Payer: Self-pay | Admitting: Family Medicine

## 2015-01-23 DIAGNOSIS — I82401 Acute embolism and thrombosis of unspecified deep veins of right lower extremity: Secondary | ICD-10-CM

## 2015-01-24 ENCOUNTER — Ambulatory Visit (HOSPITAL_COMMUNITY): Payer: Medicare Other

## 2015-01-27 ENCOUNTER — Ambulatory Visit (HOSPITAL_COMMUNITY): Payer: Medicare Other

## 2015-03-07 ENCOUNTER — Encounter (HOSPITAL_COMMUNITY): Payer: Self-pay | Admitting: Nurse Practitioner

## 2015-03-07 ENCOUNTER — Emergency Department (HOSPITAL_COMMUNITY)
Admission: EM | Admit: 2015-03-07 | Discharge: 2015-03-07 | Disposition: A | Discharge status: Home / Self Care | Payer: Medicare Other | Attending: Emergency Medicine | Admitting: Emergency Medicine

## 2015-03-07 DIAGNOSIS — Z79899 Other long term (current) drug therapy: Secondary | ICD-10-CM | POA: Insufficient documentation

## 2015-03-07 DIAGNOSIS — E079 Disorder of thyroid, unspecified: Secondary | ICD-10-CM | POA: Insufficient documentation

## 2015-03-07 DIAGNOSIS — K5900 Constipation, unspecified: Secondary | ICD-10-CM | POA: Diagnosis not present

## 2015-03-07 DIAGNOSIS — F329 Major depressive disorder, single episode, unspecified: Secondary | ICD-10-CM | POA: Diagnosis not present

## 2015-03-07 DIAGNOSIS — R109 Unspecified abdominal pain: Secondary | ICD-10-CM | POA: Diagnosis present

## 2015-03-07 DIAGNOSIS — I10 Essential (primary) hypertension: Secondary | ICD-10-CM | POA: Insufficient documentation

## 2015-03-07 DIAGNOSIS — Z87891 Personal history of nicotine dependence: Secondary | ICD-10-CM | POA: Diagnosis not present

## 2015-03-07 DIAGNOSIS — G8929 Other chronic pain: Secondary | ICD-10-CM

## 2015-03-07 DIAGNOSIS — F419 Anxiety disorder, unspecified: Secondary | ICD-10-CM | POA: Insufficient documentation

## 2015-03-07 HISTORY — DX: Unilateral inguinal hernia, without obstruction or gangrene, not specified as recurrent: K40.90

## 2015-03-07 HISTORY — DX: Disorder of thyroid, unspecified: E07.9

## 2015-03-07 LAB — COMPREHENSIVE METABOLIC PANEL
ALK PHOS: 59 U/L (ref 38–126)
ALT: 16 U/L — AB (ref 17–63)
AST: 18 U/L (ref 15–41)
Albumin: 3.9 g/dL (ref 3.5–5.0)
Anion gap: 6 (ref 5–15)
BILIRUBIN TOTAL: 1.1 mg/dL (ref 0.3–1.2)
BUN: 11 mg/dL (ref 6–20)
CALCIUM: 8.5 mg/dL — AB (ref 8.9–10.3)
CO2: 27 mmol/L (ref 22–32)
CREATININE: 1.09 mg/dL (ref 0.61–1.24)
Chloride: 103 mmol/L (ref 101–111)
GFR calc Af Amer: >60 mL/min (ref >60)
Glucose, Bld: 91 mg/dL (ref 65–99)
Potassium: 3.8 mmol/L (ref 3.5–5.1)
Sodium: 136 mmol/L (ref 135–145)
TOTAL PROTEIN: 6.5 g/dL (ref 6.5–8.1)

## 2015-03-07 LAB — CBC
HCT: 42 % (ref 39.0–52.0)
Hemoglobin: 14.9 g/dL (ref 13.0–17.0)
MCH: 32 pg (ref 26.0–34.0)
MCHC: 35.5 g/dL (ref 30.0–36.0)
MCV: 90.1 fL (ref 78.0–100.0)
PLATELETS: 151 10*3/uL (ref 150–400)
RBC: 4.66 MIL/uL (ref 4.22–5.81)
RDW: 12.6 % (ref 11.5–15.5)
WBC: 7 10*3/uL (ref 4.0–10.5)

## 2015-03-07 MED ORDER — POLYETHYLENE GLYCOL 3350 17 G PO PACK: 17.0000 g | PACK | Freq: Every day | ORAL | Status: DC

## 2015-03-07 MED ORDER — ONDANSETRON 4 MG PO TBDP: 4.0000 mg | ORAL_TABLET | ORAL | Status: DC | PRN

## 2015-03-07 NOTE — ED Notes (Signed)
He states hes had nausea and abd pain over past year, hes been to [facility] ER for this multiple times and no diagnosis. He states "it feels like something is pushing out of my stomach." he reports episodes of diarrhea and constipation over past year. He denies urinary changes

## 2015-03-07 NOTE — ED Notes (Signed)
Discharge instructions and prescriptions reviewed - voiced understanding 

## 2015-03-07 NOTE — ED Provider Notes (Signed)
CSN: [id number]     Arrival date & time 03/07/15  1515 History   First MD Initiated Contact with Patient 03/07/15 1853     Chief Complaint  Patient presents with  . Nausea  . Abdominal Pain     (Consider location/radiation/quality/duration/timing/severity/associated sxs/prior Treatment) HPI Patient has had long-standing and recurrent episodes of bloating and nausea. This has been going on for over a years time. He reports it started to feel like something is pushing out of his stomach. He reports today that he is having problems with constipation. He reports his last bowel movement was 3 days ago. Patient's been trying milk of magnesia for his constipation. He has been taking Dramamine for nausea. He has not had fever or vomiting. He has had diagnostic studies for this and report he doesn't want another "CT scan because of the radiation", he would however like to get an MRI. He reports he has not gotten endoscopies as recommended because they are "dangerous", he states there is another test he can get that doesn't require the scope and he wants that. Past Medical History  Diagnosis Date  . Hypertension   . Anxiety   . Anxiety and depression   . Thyroid disease   . Inguinal hernia    History reviewed. No pertinent past surgical history. History reviewed. No pertinent family history. Social History  Substance Use Topics  . Smoking status: Former Games developer  . Smokeless tobacco: Former Neurosurgeon    Quit date: 10/22/2010  . Alcohol Use: No    Review of Systems 10 Systems reviewed and are negative for acute change except as noted in the HPI.    Allergies  Demerol; Epinephrine; Morphine and related; Shellfish allergy; and Soy allergy  Home Medications   Prior to Admission medications   Medication Sig Start Date End Date Taking? Authorizing Provider  amLODipine (NORVASC) 5 MG tablet Take 5 mg by mouth at bedtime.   Yes Historical Provider, MD  bimatoprost (LUMIGAN) 0.01 % SOLN Place 1  drop into both eyes at bedtime.   Yes Historical Provider, MD  diazepam (VALIUM) 10 MG tablet Take 10 mg by mouth every 6 (six) hours as needed for anxiety.   Yes Historical Provider, MD  HYDROcodone-acetaminophen (NORCO) 7.5-325 MG per tablet Take 1 tablet by mouth every 6 (six) hours as needed. For pain   Yes Historical Provider, MD  levothyroxine (SYNTHROID, LEVOTHROID) 137 MCG tablet Take 137 mcg by mouth daily before breakfast.   Yes Historical Provider, MD   BP 152/94 mmHg  Pulse 77  Temp(Src) 97.9 F (36.6 C) (Oral)  Resp 18  Ht 5\' 7"  (1.702 m)  Wt 201 lb 4.8 oz (91.309 kg)  BMI 31.52 kg/m2  SpO2 100% Physical Exam  Constitutional: He is oriented to person, place, and time. He appears well-developed and well-nourished.  HENT:  Head: Normocephalic and atraumatic.  Eyes: EOM are normal. Pupils are equal, round, and reactive to light.  Neck: Neck supple.  Cardiovascular: Normal rate, regular rhythm, normal heart sounds and intact distal pulses.   Pulmonary/Chest: Effort normal and breath sounds normal.  Abdominal: Soft. Bowel sounds are normal. He exhibits no distension. There is no tenderness.  Musculoskeletal: Normal range of motion. He exhibits no edema.  Neurological: He is alert and oriented to person, place, and time. He has normal strength. Coordination normal. GCS eye subscore is 4. GCS verbal subscore is 5. GCS motor subscore is 6.  Skin: Skin is warm, dry and intact.  Psychiatric: He has  a normal mood and affect.    ED Course  Procedures (including critical care time) Labs Review Labs Reviewed  COMPREHENSIVE METABOLIC PANEL - Abnormal; Notable for the following:    Calcium 8.5 (*)    ALT 16 (*)    All other components within normal limits  CBC    Imaging Review No results found. I have personally reviewed and evaluated these images and lab results as part of my medical decision-making.   EKG Interpretation None      MDM   Final diagnoses:   Constipation, unspecified constipation type  Chronic abdominal pain   Patient presents with long history of abdominal discomfort. Review of EMR indicates that he has had prior surgical consult and emergency department visits in the [facility] system with diagnostic evaluation. By history today there is no acute change. Patient's predominant symptom is bloating and constipation. His abdominal examination is completely soft. Basic lab work is within normal limits. At this time I do not feel that further diagnostic evaluation is indicated. The patient is counseled on follow-up plan. Reports he wishes to change his providers to this area from the [place] area.    Arby Barrette, MD 03/07/15 [phone]

## 2015-03-07 NOTE — ED Notes (Signed)
Up standing at the bedside to urinate

## 2015-03-07 NOTE — Discharge Instructions (Signed)
Constipation  Constipation is when a person has fewer than three bowel movements a week, has difficulty having a bowel movement, or has stools that are dry, hard, or larger than normal. As people grow older, constipation is more common. If you try to fix constipation with medicines that make you have a bowel movement (laxatives), the problem may get worse. Long-term laxative use may cause the muscles of the colon to become weak. A low-fiber diet, not taking in enough fluids, and taking certain medicines may make constipation worse.   CAUSES    Certain medicines, such as antidepressants, pain medicine, iron supplements, antacids, and water pills.    Certain diseases, such as diabetes, irritable bowel syndrome (IBS), thyroid disease, or depression.    Not drinking enough water.    Not eating enough fiber-rich foods.    Stress or travel.    Lack of physical activity or exercise.    Ignoring the urge to have a bowel movement.    Using laxatives too much.   SIGNS AND SYMPTOMS    Having fewer than three bowel movements a week.    Straining to have a bowel movement.    Having stools that are hard, dry, or larger than normal.    Feeling full or bloated.    Pain in the lower abdomen.    Not feeling relief after having a bowel movement.   DIAGNOSIS   Your health care provider will take a medical history and perform a physical exam. Further testing may be done for severe constipation. Some tests may include:   A barium enema X-ray to examine your rectum, colon, and, sometimes, your small intestine.    A sigmoidoscopy to examine your lower colon.    A colonoscopy to examine your entire colon.  TREATMENT   Treatment will depend on the severity of your constipation and what is causing it. Some dietary treatments include drinking more fluids and eating more fiber-rich foods. Lifestyle treatments may include regular exercise. If these diet and lifestyle recommendations do not help, your health care  provider may recommend taking over-the-counter laxative medicines to help you have bowel movements. Prescription medicines may be prescribed if over-the-counter medicines do not work.   HOME CARE INSTRUCTIONS    Eat foods that have a lot of fiber, such as fruits, vegetables, whole grains, and beans.   Limit foods high in fat and processed sugars, such as french fries, hamburgers, cookies, candies, and soda.    A fiber supplement may be added to your diet if you cannot get enough fiber from foods.    Drink enough fluids to keep your urine clear or pale yellow.    Exercise regularly or as directed by your health care provider.    Go to the restroom when you have the urge to go. Do not hold it.    Only take over-the-counter or prescription medicines as directed by your health care provider. Do not take other medicines for constipation without talking to your health care provider first.   SEEK IMMEDIATE MEDICAL CARE IF:    You have bright red blood in your stool.    Your constipation lasts for more than 4 days or gets worse.    You have abdominal or rectal pain.    You have thin, pencil-like stools.    You have unexplained weight loss.  MAKE SURE YOU:    Understand these instructions.   Will watch your condition.   Will get help right away if you are not   doing well or get worse.  Document Released: 04/02/2004 Document Revised: 07/10/2013 Document Reviewed: 04/16/2013  Exit[employer] Patient Information 2015 [employer]. This information is not intended to replace advice given to you by your health care provider. Make sure you discuss any questions you have with your health care provider.  Abdominal Pain  Many things can cause abdominal pain. Usually, abdominal pain is not caused by a disease and will improve without treatment. It can often be observed and treated at home. Your health care provider will do a physical exam and possibly order blood tests and X-rays to help determine the seriousness  of your pain. However, in many cases, more time must pass before a clear cause of the pain can be found. Before that point, your health care provider may not know if you need more testing or further treatment.  HOME CARE INSTRUCTIONS   Monitor your abdominal pain for any changes. The following actions may help to alleviate any discomfort you are experiencing:   Only take over-the-counter or prescription medicines as directed by your health care provider.   Do not take laxatives unless directed to do so by your health care provider.   Try a clear liquid diet (broth, tea, or water) as directed by your health care provider. Slowly move to a bland diet as tolerated.  SEEK MEDICAL CARE IF:   You have unexplained abdominal pain.   You have abdominal pain associated with nausea or diarrhea.   You have pain when you urinate or have a bowel movement.   You experience abdominal pain that wakes you in the night.   You have abdominal pain that is worsened or improved by eating food.   You have abdominal pain that is worsened with eating fatty foods.   You have a fever.  SEEK IMMEDIATE MEDICAL CARE IF:    Your pain does not go away within 2 hours.   You keep throwing up (vomiting).   Your pain is felt only in portions of the abdomen, such as the right side or the left lower portion of the abdomen.   You pass bloody or black tarry stools.  MAKE SURE YOU:   Understand these instructions.    Will watch your condition.    Will get help right away if you are not doing well or get worse.   Document Released: 04/14/2005 Document Revised: 07/10/2013 Document Reviewed: 03/14/2013  [employer] Patient Information 2015 [employer]. This information is not intended to replace advice given to you by your health care provider. Make sure you discuss any questions you have with your health care provider.

## 2015-03-15 ENCOUNTER — Emergency Department (HOSPITAL_COMMUNITY)
Admission: EM | Admit: 2015-03-15 | Discharge: 2015-03-15 | Disposition: A | Discharge status: Home / Self Care | Payer: Medicare Other | Attending: Emergency Medicine | Admitting: Emergency Medicine

## 2015-03-15 ENCOUNTER — Encounter (HOSPITAL_COMMUNITY): Payer: Self-pay

## 2015-03-15 DIAGNOSIS — F329 Major depressive disorder, single episode, unspecified: Secondary | ICD-10-CM | POA: Diagnosis not present

## 2015-03-15 DIAGNOSIS — Z87891 Personal history of nicotine dependence: Secondary | ICD-10-CM | POA: Insufficient documentation

## 2015-03-15 DIAGNOSIS — F419 Anxiety disorder, unspecified: Secondary | ICD-10-CM | POA: Insufficient documentation

## 2015-03-15 DIAGNOSIS — I10 Essential (primary) hypertension: Secondary | ICD-10-CM | POA: Insufficient documentation

## 2015-03-15 DIAGNOSIS — R110 Nausea: Secondary | ICD-10-CM | POA: Diagnosis present

## 2015-03-15 DIAGNOSIS — K4090 Unilateral inguinal hernia, without obstruction or gangrene, not specified as recurrent: Secondary | ICD-10-CM | POA: Insufficient documentation

## 2015-03-15 DIAGNOSIS — Z79899 Other long term (current) drug therapy: Secondary | ICD-10-CM | POA: Diagnosis not present

## 2015-03-15 DIAGNOSIS — E079 Disorder of thyroid, unspecified: Secondary | ICD-10-CM | POA: Diagnosis not present

## 2015-03-15 LAB — COMPREHENSIVE METABOLIC PANEL
ALBUMIN: 3.8 g/dL (ref 3.5–5.0)
ALT: 17 U/L (ref 17–63)
ANION GAP: 8 (ref 5–15)
AST: 20 U/L (ref 15–41)
Alkaline Phosphatase: 63 U/L (ref 38–126)
BUN: 16 mg/dL (ref 6–20)
CHLORIDE: 102 mmol/L (ref 101–111)
CO2: 27 mmol/L (ref 22–32)
Calcium: 8.6 mg/dL — ABNORMAL LOW (ref 8.9–10.3)
Creatinine, Ser: 1.23 mg/dL (ref 0.61–1.24)
GFR calc non Af Amer: 58 mL/min — ABNORMAL LOW (ref >60)
GLUCOSE: 98 mg/dL (ref 65–99)
POTASSIUM: 4.1 mmol/L (ref 3.5–5.1)
SODIUM: 137 mmol/L (ref 135–145)
Total Bilirubin: 1.1 mg/dL (ref 0.3–1.2)
Total Protein: 6.6 g/dL (ref 6.5–8.1)

## 2015-03-15 LAB — CBC
HEMATOCRIT: 43.6 % (ref 39.0–52.0)
HEMOGLOBIN: 15.1 g/dL (ref 13.0–17.0)
MCH: 31.7 pg (ref 26.0–34.0)
MCHC: 34.6 g/dL (ref 30.0–36.0)
MCV: 91.6 fL (ref 78.0–100.0)
Platelets: 161 10*3/uL (ref 150–400)
RBC: 4.76 MIL/uL (ref 4.22–5.81)
RDW: 12.4 % (ref 11.5–15.5)
WBC: 7.1 10*3/uL (ref 4.0–10.5)

## 2015-03-15 LAB — CBG MONITORING, ED: GLUCOSE-CAPILLARY: 92 mg/dL (ref 65–99)

## 2015-03-15 LAB — LIPASE, BLOOD: LIPASE: 19 U/L — AB (ref 22–51)

## 2015-03-15 NOTE — ED Provider Notes (Signed)
CSN: [id number]     Arrival date & time 03/15/15  2006 History   First MD Initiated Contact with Patient 03/15/15 2151     Chief Complaint  Patient presents with  . Nausea  . Inguinal Hernia     The history is provided by the patient. No language interpreter was used.   Eric [name] presents for evaluation of nausea.  He has had several months of nausea (6+) as well as progressive swelling in his left groin.  He has swelling in the groin with standing and mild occasional pain in the area (1-2/10).  The swelling improves when he lies down.  He denies any fevers, vomiting, diarrhea, dysuria.  He is worried his intestines might explode. He was prescribed Zofran when he was seen in the emergency department on August 19 but he has not taken this. He is taking Dramamine and sent for the nausea. He was seen at [facility] in December 2015 for similar symptoms and was diagnosed with a hernia on CT scan. Symptoms are mild, waxing and waning, worsening.  Past Medical History  Diagnosis Date  . Hypertension   . Anxiety   . Anxiety and depression   . Thyroid disease   . Inguinal hernia    History reviewed. No pertinent past surgical history. History reviewed. No pertinent family history. Social History  Substance Use Topics  . Smoking status: Former Games developer  . Smokeless tobacco: Former Neurosurgeon    Quit date: 10/22/2010  . Alcohol Use: No    Review of Systems  All other systems reviewed and are negative.     Allergies  Demerol; Epinephrine; Nardil; Morphine and related; Shellfish allergy; and Soy allergy  Home Medications   Prior to Admission medications   Medication Sig Start Date End Date Taking? Authorizing Provider  amLODipine (NORVASC) 5 MG tablet Take 5 mg by mouth at bedtime.   Yes Historical Provider, MD  bimatoprost (LUMIGAN) 0.01 % SOLN Place 1 drop into both eyes at bedtime.   Yes Historical Provider, MD  cyanocobalamin ([employer] VITAMIN B12) 2000 MCG tablet Take 2,000 mcg by mouth as needed.  Every few weeks   Yes Historical Provider, MD  diazepam (VALIUM) 10 MG tablet Take 10 mg by mouth every 6 (six) hours as needed for anxiety.   Yes Historical Provider, MD  dimenhyDRINATE (DRAMAMINE) 50 MG tablet Take 50-100 mg by mouth every 8 (eight) hours as needed for nausea.   Yes Historical Provider, MD  HYDROcodone-acetaminophen (NORCO) 7.5-325 MG per tablet Take 1 tablet by mouth every 6 (six) hours as needed. For pain   Yes Historical Provider, MD  levothyroxine (SYNTHROID, LEVOTHROID) 125 MCG tablet Take 125 mcg by mouth daily before breakfast.   Yes Historical Provider, MD  mirtazapine (REMERON SOL-TAB) 15 MG disintegrating tablet Take 15 mg by mouth at bedtime. 03/11/15   Historical Provider, MD  ondansetron (ZOFRAN ODT) 4 MG disintegrating tablet Take 1 tablet (4 mg total) by mouth every 4 (four) hours as needed for nausea or vomiting. 03/07/15   Arby Barrette, MD  polyethylene glycol (MIRALAX / GLYCOLAX) packet Take 17 g by mouth daily. 03/07/15   Arby Barrette, MD   BP 123/86 mmHg  Pulse 65  Temp(Src) 98.1 F (36.7 C) (Oral)  Resp 18  Wt 201 lb (91.173 kg)  SpO2 96% Physical Exam  Constitutional: He is oriented to person, place, and time. He appears well-developed and well-nourished.  HENT:  Head: Normocephalic and atraumatic.  Cardiovascular: Normal rate and regular rhythm.  No murmur heard. Pulmonary/Chest: Effort normal and breath sounds normal. No respiratory distress.  Abdominal: Soft. There is no tenderness. There is no rebound and no guarding.  Genitourinary:  Indirect left inguinal hernia that is completely reducible on examination.  Musculoskeletal: He exhibits no edema or tenderness.  Neurological: He is alert and oriented to person, place, and time.  Skin: Skin is warm and dry.  Psychiatric: He has a normal mood and affect. His behavior is normal.  Nursing note and vitals reviewed.   ED Course  Procedures (including critical care time) Labs Review Labs  Reviewed  LIPASE, BLOOD - Abnormal; Notable for the following:    Lipase 19 (*)    All other components within normal limits  COMPREHENSIVE METABOLIC PANEL - Abnormal; Notable for the following:    Calcium 8.6 (*)    GFR calc non Af Amer 58 (*)    All other components within normal limits  CBC  URINALYSIS, ROUTINE W REFLEX MICROSCOPIC (NOT AT [facility])  CBG MONITORING, ED    Imaging Review No results found. I have personally reviewed and evaluated these images and lab results as part of my medical decision-making.   EKG Interpretation None      MDM   Final diagnoses:  Unilateral inguinal hernia without obstruction or gangrene, recurrence not specified    Patient here for chronic nausea and hernia evaluation. On examination patient is nontoxic appearing with no abdominal tenderness or hernia tenderness on examination. Hernia is fully reduced in the emergency department.  Offered patient reassurance for inguinal hernia and discussed outpatient general surgery follow-up as well as return precautions. Recommended the patient that he take his Zofran as prescribed and return if he has any new or worrisome symptoms    Tilden Fossa, MD 03/15/15 2255

## 2015-03-15 NOTE — Discharge Instructions (Signed)

## 2015-03-15 NOTE — ED Notes (Signed)
MD at bedside. 

## 2015-03-15 NOTE — ED Notes (Signed)
Pt was here on the 19th for nausea and left lower abd pain. Dx with inguinal hernia on that side at [facility] in 2015. Not sure if that is what is bothering him or not. Reports his stool is green. No vomiting. Has been taking dramamine to try to help with it.

## 2015-05-04 ENCOUNTER — Encounter (HOSPITAL_COMMUNITY): Payer: Self-pay | Admitting: *Deleted

## 2015-05-04 ENCOUNTER — Emergency Department (HOSPITAL_COMMUNITY)
Admission: EM | Admit: 2015-05-04 | Discharge: 2015-05-04 | Disposition: A | Discharge status: Home / Self Care | Payer: Medicare Other | Attending: Emergency Medicine | Admitting: Emergency Medicine

## 2015-05-04 DIAGNOSIS — R110 Nausea: Secondary | ICD-10-CM | POA: Insufficient documentation

## 2015-05-04 DIAGNOSIS — Z79899 Other long term (current) drug therapy: Secondary | ICD-10-CM | POA: Insufficient documentation

## 2015-05-04 DIAGNOSIS — F419 Anxiety disorder, unspecified: Secondary | ICD-10-CM | POA: Diagnosis not present

## 2015-05-04 DIAGNOSIS — Z87891 Personal history of nicotine dependence: Secondary | ICD-10-CM | POA: Diagnosis not present

## 2015-05-04 DIAGNOSIS — E079 Disorder of thyroid, unspecified: Secondary | ICD-10-CM | POA: Insufficient documentation

## 2015-05-04 DIAGNOSIS — I10 Essential (primary) hypertension: Secondary | ICD-10-CM | POA: Diagnosis not present

## 2015-05-04 DIAGNOSIS — F329 Major depressive disorder, single episode, unspecified: Secondary | ICD-10-CM | POA: Diagnosis not present

## 2015-05-04 MED ORDER — METOCLOPRAMIDE HCL 10 MG PO TABS
5.0000 mg | ORAL_TABLET | Freq: Once | ORAL | Status: DC
  Filled 2015-05-04: qty 1

## 2015-05-04 MED ORDER — DIPHENHYDRAMINE HCL 25 MG PO CAPS
25.0000 mg | ORAL_CAPSULE | Freq: Once | ORAL | Status: DC
  Filled 2015-05-04: qty 1

## 2015-05-04 NOTE — ED Notes (Signed)
Pt reports just having nausea, has been seen here x 3 for same.

## 2015-05-04 NOTE — ED Notes (Signed)
Family at bedside. 

## 2015-05-04 NOTE — Discharge Instructions (Signed)
Nausea, Adult Nausea is the feeling that you have an upset stomach or have to vomit. Nausea by itself is not likely a serious concern, but it may be an early sign of more serious medical problems. As nausea gets worse, it can lead to vomiting. If vomiting develops, there is the risk of dehydration.  CAUSES   Viral infections.  Food poisoning.  Medicines.  Pregnancy.  Motion sickness.  Migraine headaches.  Emotional distress.  Severe pain from any source.  Alcohol intoxication. HOME CARE INSTRUCTIONS  Get plenty of rest.  Ask your caregiver about specific rehydration instructions.  Eat small amounts of food and sip liquids more often.  Take all medicines as told by your caregiver. SEEK MEDICAL CARE IF:  You have not improved after 2 days, or you get worse.  You have a headache. SEEK IMMEDIATE MEDICAL CARE IF:   You have a fever.  You faint.  You keep vomiting or have blood in your vomit.  You are extremely weak or dehydrated.  You have dark or bloody stools.  You have severe chest or abdominal pain. MAKE SURE YOU:  Understand these instructions.  Will watch your condition.  Will get help right away if you are not doing well or get worse.   This information is not intended to replace advice given to you by your health care provider. Make sure you discuss any questions you have with your health care provider.   Document Released: 08/12/2004 Document Revised: 07/26/2014 Document Reviewed: 03/17/2011 Elsevier Interactive Patient Education 2016 [employer]  Please contact your primary care provider and inform them of your visit. Please follow up with gastroenterology as previously recommended.

## 2015-05-04 NOTE — ED Provider Notes (Signed)
CSN: [id number]     Arrival date & time 05/04/15  4098 History   First MD Initiated Contact with Patient 05/04/15 1000     Chief Complaint  Patient presents with  . Nausea   HPI  69 year old presents with nausea. Patient reports a chronic history of same, has been seen numerous times by different specialties including our system, take system. Most recently he was referred to lower GI for follow-up evaluation. He reports difficulty with transfer records, as not been able to schedule an appointment at this time. Patient reports the nausea is consistent, unchanged, slightly worse with foods. Denies vomiting. Currently the only medication that seems to provide any symptomatic relief is diphenhydramine, patient takes this 3 times a day. He reports some associated abdominal discomfort, located in the left upper quadrant, no radiation of symptoms, nontender to palpation or body movements. No history fever, weight loss, night sweats. Patient has not received a colonoscopy, recommended endoscopy.    Past Medical History  Diagnosis Date  . Hypertension   . Anxiety   . Anxiety and depression   . Thyroid disease   . Inguinal hernia    History reviewed. No pertinent past surgical history. History reviewed. No pertinent family history. Social History  Substance Use Topics  . Smoking status: Former Games developer  . Smokeless tobacco: Former Neurosurgeon    Quit date: 10/22/2010  . Alcohol Use: No    Review of Systems  All other systems reviewed and are negative.   Allergies  Demerol; Epinephrine; Nardil; Morphine and related; Ondansetron; Shellfish allergy; and Soy allergy  Home Medications   Prior to Admission medications   Medication Sig Start Date End Date Taking? Authorizing Provider  amLODipine (NORVASC) 10 MG tablet Take 10 mg by mouth at bedtime.   Yes Historical Provider, MD  bimatoprost (LUMIGAN) 0.01 % SOLN Place 1 drop into both eyes at bedtime.   Yes Historical Provider, MD  cyanocobalamin  500 MCG tablet Take 500 mcg by mouth 3 (three) times a week.   Yes Historical Provider, MD  diazepam (VALIUM) 10 MG tablet Take 10 mg by mouth every 6 (six) hours as needed for anxiety.   Yes Historical Provider, MD  dimenhyDRINATE (DRAMAMINE) 50 MG tablet Take 50-100 mg by mouth every 8 (eight) hours as needed for nausea.   Yes Historical Provider, MD  HYDROcodone-acetaminophen (NORCO) 7.5-325 MG per tablet Take 1 tablet by mouth every 6 (six) hours as needed for moderate pain or severe pain. For pain   Yes Historical Provider, MD  levothyroxine (SYNTHROID, LEVOTHROID) 125 MCG tablet Take 125 mcg by mouth daily before breakfast.   Yes Historical Provider, MD  ondansetron (ZOFRAN ODT) 4 MG disintegrating tablet Take 1 tablet (4 mg total) by mouth every 4 (four) hours as needed for nausea or vomiting. Patient not taking: Reported on 05/04/2015 03/07/15   Arby Barrette, MD  polyethylene glycol (MIRALAX / GLYCOLAX) packet Take 17 g by mouth daily. Patient not taking: Reported on 05/04/2015 03/07/15   Arby Barrette, MD   BP 119/94 mmHg  Pulse 76  Temp(Src) 97.7 F (36.5 C) (Oral)  Resp 20  Ht  (1.702 m)  Wt 212 lb (96.163 kg)  BMI 33.20 kg/m2  SpO2 95%   Physical Exam  Constitutional: He is oriented to person, place, and time. He appears well-developed and well-nourished.  HENT:  Head: Normocephalic and atraumatic.  Eyes: Conjunctivae are normal. Pupils are equal, round, and reactive to light. Right eye exhibits no discharge. Left eye  exhibits no discharge. No scleral icterus.  Neck: Normal range of motion. No JVD present. No tracheal deviation present.  Pulmonary/Chest: Effort normal. No stridor.  Abdominal: Soft. Bowel sounds are normal. He exhibits no distension and no mass. There is no tenderness. There is no rebound and no guarding.  Musculoskeletal: Normal range of motion. He exhibits no edema or tenderness.  Neurological: He is alert and oriented to person, place, and time.  Coordination normal.  Skin: Skin is warm and dry.  Psychiatric: He has a normal mood and affect. His behavior is normal. Judgment and thought content normal.  Nursing note and vitals reviewed.     ED Course  Procedures (including critical care time) Labs Review Labs Reviewed - No data to display  Imaging Review No results found. I have personally reviewed and evaluated these images and lab results as part of my medical decision-making.   EKG Interpretation None        MDM   Final diagnoses:  Nausea   Labs:   Imaging:  Consults:  Therapeutics: Reglan ( refused) , diphenhydramine   Discharge Meds:   Assessment/Plan: She presents with chronic nausea, he has tried numerous therapies before with Dramamine the only medication that relieves his symptoms. Patient was visibly upset during my initial evaluation. He was easily reassured and calmed. Patient's presentation is chronic, uncertain etiology at this time. It appears to be stable, he has had significant follow-up evaluation with both primary care and GI specialist. He does have a referral to lower GI, he has had difficulty with having his information faxed from previous providers to them. I informed patient that he would need to go to the office and person to sort out any confusion. He does need GI follow-up, I offered Reglan here in the ED, patient refused. Patient will be discharged home with instructions to contact his primary care provider inform him of today's visit. Follow up with GI specialist. He was given strict return precautions, he verbalizes understanding and agreement to today's plan and had no further questions or concerns at time of discharge.         Eyvonne MechanicJeffrey Anastasha Ortez, PA-C 05/05/15 1436  Margarita Grizzleanielle Ray, MD 05/12/15 [id number]

## 2015-05-25 ENCOUNTER — Emergency Department (HOSPITAL_COMMUNITY)
Admission: EM | Admit: 2015-05-25 | Discharge: 2015-05-25 | Disposition: A | Discharge status: Home / Self Care | Payer: Medicare Other | Attending: Emergency Medicine | Admitting: Emergency Medicine

## 2015-05-25 ENCOUNTER — Encounter (HOSPITAL_COMMUNITY): Payer: Self-pay | Admitting: Emergency Medicine

## 2015-05-25 DIAGNOSIS — E039 Hypothyroidism, unspecified: Secondary | ICD-10-CM | POA: Insufficient documentation

## 2015-05-25 DIAGNOSIS — R110 Nausea: Secondary | ICD-10-CM | POA: Diagnosis present

## 2015-05-25 DIAGNOSIS — Z87891 Personal history of nicotine dependence: Secondary | ICD-10-CM | POA: Diagnosis not present

## 2015-05-25 DIAGNOSIS — I10 Essential (primary) hypertension: Secondary | ICD-10-CM | POA: Insufficient documentation

## 2015-05-25 DIAGNOSIS — F419 Anxiety disorder, unspecified: Secondary | ICD-10-CM | POA: Diagnosis not present

## 2015-05-25 DIAGNOSIS — Z79899 Other long term (current) drug therapy: Secondary | ICD-10-CM | POA: Diagnosis not present

## 2015-05-25 MED ORDER — PROCHLORPERAZINE MALEATE 10 MG PO TABS
10.0000 mg | ORAL_TABLET | Freq: Once | ORAL | Status: DC
  Filled 2015-05-25: qty 1

## 2015-05-25 MED ORDER — PROCHLORPERAZINE MALEATE 10 MG PO TABS: 10.0000 mg | ORAL_TABLET | Freq: Two times a day (BID) | ORAL | Status: DC | PRN

## 2015-05-25 NOTE — ED Provider Notes (Signed)
CSN: [id number]     Arrival date & time 05/25/15  1050 History   First MD Initiated Contact with Patient 05/25/15 1151     Chief Complaint  Patient presents with  . Nausea     (Consider location/radiation/quality/duration/timing/severity/associated sxs/prior Treatment) HPI Comments: Aly Hauser III is a 69 y.o. male with a PMHx of HTN, anxiety, hypothyroidism, and recurrent nausea, who presents to the ED with complaints of chronic nausea. He states this is been ongoing for years, he has been seen by [facility] specialists who have never found a etiology. He reports that he takes Dramamine at home which helps and eating worsens his symptoms. He states he just had a bag of chips in the lobby and that seemed to aggravate his symptoms. No specific foods seem to increase his symptoms compared to others. He denies any fevers, chills, chest pain, shortness breath, abdominal pain, vomiting, diarrhea, constipation, melena, hematochezia, obstipation, dysuria, hematuria, numbness, tingling, weakness, recent travel, suspicious food intake, sick contacts, alcohol use, NSAID use, or recent antibiotics.  Patient is a 69 y.o. male presenting with general illness. The history is provided by the patient and medical records. No language interpreter was used.  Illness Location:  Nausea Quality:  Nausea Severity:  Mild Onset quality:  Gradual Timing:  Constant Progression:  Unchanged Chronicity:  Chronic Context:  Eating Relieved by:  Dramamine Worsened by:  Eating Ineffective treatments:  Nothing Associated symptoms: nausea   Associated symptoms: no abdominal pain, no chest pain, no diarrhea, no fever, no myalgias, no shortness of breath and no vomiting     Past Medical History  Diagnosis Date  . Hypertension   . Anxiety   . Anxiety and depression   . Thyroid disease   . Inguinal hernia    History reviewed. No pertinent past surgical history. No family history on file. Social History  Substance Use  Topics  . Smoking status: Former Games developer  . Smokeless tobacco: Former Neurosurgeon    Quit date: 10/22/2010  . Alcohol Use: No    Review of Systems  Constitutional: Negative for fever and chills.  Respiratory: Negative for shortness of breath.   Cardiovascular: Negative for chest pain.  Gastrointestinal: Positive for nausea. Negative for vomiting, abdominal pain, diarrhea, constipation and blood in stool.  Genitourinary: Negative for dysuria and hematuria.  Musculoskeletal: Negative for myalgias and arthralgias.  Skin: Negative for color change.  Allergic/Immunologic: Negative for immunocompromised state.  Neurological: Negative for weakness and numbness.  Psychiatric/Behavioral: Negative for confusion.   10 Systems reviewed and are negative for acute change except as noted in the HPI.    Allergies  Demerol; Epinephrine; Nardil; Morphine and related; Ondansetron; Shellfish allergy; and Soy allergy  Home Medications   Prior to Admission medications   Medication Sig Start Date End Date Taking? Authorizing Provider  amLODipine (NORVASC) 10 MG tablet Take 10 mg by mouth at bedtime.    Historical Provider, MD  bimatoprost (LUMIGAN) 0.01 % SOLN Place 1 drop into both eyes at bedtime.    Historical Provider, MD  cyanocobalamin 500 MCG tablet Take 500 mcg by mouth 3 (three) times a week.    Historical Provider, MD  diazepam (VALIUM) 10 MG tablet Take 10 mg by mouth every 6 (six) hours as needed for anxiety.    Historical Provider, MD  dimenhyDRINATE (DRAMAMINE) 50 MG tablet Take 50-100 mg by mouth every 8 (eight) hours as needed for nausea.    Historical Provider, MD  HYDROcodone-acetaminophen (NORCO) 7.5-325 MG per tablet Take 1  tablet by mouth every 6 (six) hours as needed for moderate pain or severe pain. For pain    Historical Provider, MD  levothyroxine (SYNTHROID, LEVOTHROID) 125 MCG tablet Take 125 mcg by mouth daily before breakfast.    Historical Provider, MD  ondansetron (ZOFRAN ODT) 4  MG disintegrating tablet Take 1 tablet (4 mg total) by mouth every 4 (four) hours as needed for nausea or vomiting. Patient not taking: Reported on 05/04/2015 03/07/15   Arby Barrette, MD  polyethylene glycol (MIRALAX / GLYCOLAX) packet Take 17 g by mouth daily. Patient not taking: Reported on 05/04/2015 03/07/15   Arby Barrette, MD   BP 105/71 mmHg  Pulse 88  Temp(Src) 98.7 F (37.1 C) (Oral)  Resp 17  Ht  (1.702 m)  Wt 210 lb 2 oz (95.312 kg)  BMI 32.90 kg/m2  SpO2 94% Physical Exam  Constitutional: He is oriented to person, place, and time. Vital signs are normal. He appears well-developed and well-nourished.  Non-toxic appearance. No distress.  Afebrile, nontoxic, NAD  HENT:  Head: Normocephalic and atraumatic.  Mouth/Throat: Oropharynx is clear and moist and mucous membranes are normal.  Eyes: Conjunctivae and EOM are normal. Right eye exhibits no discharge. Left eye exhibits no discharge.  Neck: Normal range of motion. Neck supple.  Cardiovascular: Normal rate, regular rhythm, normal heart sounds and intact distal pulses.  Exam reveals no gallop and no friction rub.   No murmur heard. Pulmonary/Chest: Effort normal and breath sounds normal. No respiratory distress. He has no decreased breath sounds. He has no wheezes. He has no rhonchi. He has no rales.  Abdominal: Soft. Normal appearance and bowel sounds are normal. He exhibits no distension. There is no tenderness. There is no rigidity, no rebound, no guarding, no CVA tenderness, no tenderness at McBurney's point and negative Murphy's sign.  Soft, NTND, +BS throughout, no r/g/r, neg murphy's, neg mcburney's, no CVA TTP   Musculoskeletal: Normal range of motion.  Neurological: He is alert and oriented to person, place, and time. He has normal strength. No sensory deficit.  Skin: Skin is warm, dry and intact. No rash noted.  Psychiatric: He has a normal mood and affect.  Nursing note and vitals reviewed.   ED Course   Procedures (including critical care time) Labs Review Labs Reviewed - No data to display  Imaging Review No results found. I have personally reviewed and evaluated these images and lab results as part of my medical decision-making.   EKG Interpretation None      MDM   Final diagnoses:  Nausea    69 y.o. male here with chronic nausea. Seen by specialists at [facility], no known etiology. On exam, no focal tenderness in abdomen. Tolerating PO here. Will give compazine and have him f/up with PCP in 1wk. I explained the diagnosis and have given explicit precautions to return to the ER including for any other new or worsening symptoms. The patient understands and accepts the medical plan as it's been dictated and I have answered their questions. Discharge instructions concerning home care and prescriptions have been given. The patient is STABLE and is discharged to home in good condition.  BP 105/71 mmHg  Pulse 88  Temp(Src) 98.7 F (37.1 C) (Oral)  Resp 17  Ht  (1.702 m)  Wt 210 lb 2 oz (95.312 kg)  BMI 32.90 kg/m2  SpO2 94%  Meds ordered this encounter  Medications  . prochlorperazine (COMPAZINE) tablet 10 mg    Sig:   .  prochlorperazine (COMPAZINE) 10 MG tablet    Sig: Take 1 tablet (10 mg total) by mouth 2 (two) times daily as needed for nausea or vomiting.    Dispense:  10 tablet    Refill:  0    Order Specific Question:  Supervising Provider    Answer:  MILLER, BRIAN Eusebio Friendly[[id number]]       Jamon Hayhurst Camprubi-Soms, PA-C 05/25/15 1235  Gwyneth SproutWhitney Plunkett, MD 05/25/15 1639

## 2015-05-25 NOTE — ED Notes (Signed)
Pt. Stated, I've been here for nausea, its the same thing.  I've had it over a year.

## 2015-05-25 NOTE — Discharge Instructions (Signed)
Use compazine as needed for nausea. Stay well hydrated. Follow up with your regular doctor in 1 week for recheck of symptoms and ongoing management. Return to the ER for changes or worsening symptoms.   Nausea, Adult Nausea is the feeling that you have an upset stomach or have to vomit. Nausea by itself is not likely a serious concern, but it may be an early sign of more serious medical problems. As nausea gets worse, it can lead to vomiting. If vomiting develops, there is the risk of dehydration.  CAUSES   Viral infections.  Food poisoning.  Medicines.  Pregnancy.  Motion sickness.  Migraine headaches.  Emotional distress.  Severe pain from any source.  Alcohol intoxication. HOME CARE INSTRUCTIONS  Get plenty of rest.  Ask your caregiver about specific rehydration instructions.  Eat small amounts of food and sip liquids more often.  Take all medicines as told by your caregiver. SEEK MEDICAL CARE IF:  You have not improved after 2 days, or you get worse.  You have a headache. SEEK IMMEDIATE MEDICAL CARE IF:   You have a fever.  You faint.  You keep vomiting or have blood in your vomit.  You are extremely weak or dehydrated.  You have dark or bloody stools.  You have severe chest or abdominal pain. MAKE SURE YOU:  Understand these instructions.  Will watch your condition.  Will get help right away if you are not doing well or get worse.   This information is not intended to replace advice given to you by your health care provider. Make sure you discuss any questions you have with your health care provider.   Document Released: 08/12/2004 Document Revised: 07/26/2014 Document Reviewed: 03/17/2011 Elsevier Interactive Patient Education [employer] Inc.

## 2015-07-01 ENCOUNTER — Ambulatory Visit: Payer: Self-pay | Admitting: Family Medicine

## 2015-08-14 ENCOUNTER — Telehealth: Payer: Self-pay | Admitting: Gastroenterology

## 2015-08-14 NOTE — Telephone Encounter (Signed)
Left voice message for patient to call and schedule with Dr. Servando Snare for...needs endoscopy chronic nausea, notes under media

## 2015-11-21 ENCOUNTER — Emergency Department (HOSPITAL_COMMUNITY)
Admission: EM | Admit: 2015-11-21 | Discharge: 2015-11-21 | Discharge status: Against Medical Advice | Payer: Medicare Other | Attending: Emergency Medicine | Admitting: Emergency Medicine

## 2015-11-21 ENCOUNTER — Encounter (HOSPITAL_COMMUNITY): Payer: Self-pay | Admitting: Family Medicine

## 2015-11-21 DIAGNOSIS — R1012 Left upper quadrant pain: Secondary | ICD-10-CM | POA: Diagnosis not present

## 2015-11-21 DIAGNOSIS — Z8719 Personal history of other diseases of the digestive system: Secondary | ICD-10-CM | POA: Insufficient documentation

## 2015-11-21 DIAGNOSIS — G8929 Other chronic pain: Secondary | ICD-10-CM | POA: Diagnosis not present

## 2015-11-21 DIAGNOSIS — R5383 Other fatigue: Secondary | ICD-10-CM | POA: Insufficient documentation

## 2015-11-21 DIAGNOSIS — R197 Diarrhea, unspecified: Secondary | ICD-10-CM | POA: Insufficient documentation

## 2015-11-21 DIAGNOSIS — F419 Anxiety disorder, unspecified: Secondary | ICD-10-CM | POA: Insufficient documentation

## 2015-11-21 DIAGNOSIS — I10 Essential (primary) hypertension: Secondary | ICD-10-CM | POA: Diagnosis not present

## 2015-11-21 DIAGNOSIS — Z79899 Other long term (current) drug therapy: Secondary | ICD-10-CM | POA: Insufficient documentation

## 2015-11-21 DIAGNOSIS — R109 Unspecified abdominal pain: Secondary | ICD-10-CM | POA: Diagnosis present

## 2015-11-21 DIAGNOSIS — R1032 Left lower quadrant pain: Secondary | ICD-10-CM | POA: Diagnosis not present

## 2015-11-21 DIAGNOSIS — Z87891 Personal history of nicotine dependence: Secondary | ICD-10-CM | POA: Diagnosis not present

## 2015-11-21 DIAGNOSIS — R110 Nausea: Secondary | ICD-10-CM | POA: Insufficient documentation

## 2015-11-21 DIAGNOSIS — E039 Hypothyroidism, unspecified: Secondary | ICD-10-CM | POA: Insufficient documentation

## 2015-11-21 LAB — COMPREHENSIVE METABOLIC PANEL
ALK PHOS: 61 U/L (ref 38–126)
ALT: 15 U/L — AB (ref 17–63)
ANION GAP: 10 (ref 5–15)
AST: 17 U/L (ref 15–41)
Albumin: 3.9 g/dL (ref 3.5–5.0)
BUN: 13 mg/dL (ref 6–20)
CALCIUM: 9.3 mg/dL (ref 8.9–10.3)
CO2: 24 mmol/L (ref 22–32)
CREATININE: 1.05 mg/dL (ref 0.61–1.24)
Chloride: 103 mmol/L (ref 101–111)
Glucose, Bld: 100 mg/dL — ABNORMAL HIGH (ref 65–99)
Potassium: 4.4 mmol/L (ref 3.5–5.1)
SODIUM: 137 mmol/L (ref 135–145)
Total Bilirubin: 1.3 mg/dL — ABNORMAL HIGH (ref 0.3–1.2)
Total Protein: 6.8 g/dL (ref 6.5–8.1)

## 2015-11-21 LAB — CBC
HCT: 42.7 % (ref 39.0–52.0)
HEMOGLOBIN: 14.4 g/dL (ref 13.0–17.0)
MCH: 30.5 pg (ref 26.0–34.0)
MCHC: 33.7 g/dL (ref 30.0–36.0)
MCV: 90.5 fL (ref 78.0–100.0)
PLATELETS: 193 10*3/uL (ref 150–400)
RBC: 4.72 MIL/uL (ref 4.22–5.81)
RDW: 12.4 % (ref 11.5–15.5)
WBC: 10.4 10*3/uL (ref 4.0–10.5)

## 2015-11-21 LAB — LIPASE, BLOOD: LIPASE: 20 U/L (ref 11–51)

## 2015-11-21 MED ORDER — SODIUM CHLORIDE 0.9 % IV BOLUS (SEPSIS): 1000.0000 mL | Freq: Once | INTRAVENOUS | Status: DC

## 2015-11-21 MED ORDER — PROCHLORPERAZINE EDISYLATE 5 MG/ML IJ SOLN: 10.0000 mg | Freq: Once | INTRAMUSCULAR | Status: DC

## 2015-11-21 NOTE — ED Provider Notes (Signed)
CSN: [id number]     Arrival date & time 11/21/15  1153 History   First MD Initiated Contact with Patient 11/21/15 1334     Chief Complaint  Patient presents with  . Abdominal Pain     (Consider location/radiation/quality/duration/timing/severity/associated sxs/prior Treatment) HPI Comments: Patient presents with acute on chronic abdominal pain and nausea. States he's had nausea and ongoing left-sided abdominal pain for the past year. Endorses poor appetite and not waiting to eat or drink. He's been taking Dramamine for his nausea which the only thing that helps. He's been seen at this ED multiple times as well as [facility] without definitive etiology given. He is frustrated and states he has a poor appetite and cannot eat or drink. He cannot tell me whether he seen a gastroenterologist. He denies fever, chills or vomiting. He has had nausea. No chest pain. One episode of diarrhea today. No urinary symptoms no testicular pain. No previous abdominal surgeries. Patient states he was told the past that his pain was from his umbilical hernia but he has not followed up with a GI specialist her surgeon. States this pain is similar to his previous presentations.  The history is provided by the patient.    Past Medical History  Diagnosis Date  . Hypertension   . Anxiety   . Anxiety and depression   . Thyroid disease   . Inguinal hernia    History reviewed. No pertinent past surgical history. History reviewed. No pertinent family history. Social History  Substance Use Topics  . Smoking status: Former Games developer  . Smokeless tobacco: Former Neurosurgeon    Quit date: 10/22/2010  . Alcohol Use: No    Review of Systems  Constitutional: Positive for activity change and fatigue. Negative for fever.  HENT: Negative for congestion and postnasal drip.   Eyes: Negative for visual disturbance.  Respiratory: Negative for cough and chest tightness.   Cardiovascular: Negative for chest pain.  Gastrointestinal:  Positive for nausea, abdominal pain and diarrhea. Negative for vomiting.  Genitourinary: Negative for dysuria, hematuria and testicular pain.  Musculoskeletal: Negative for myalgias and arthralgias.  Skin: Negative for wound.  Neurological: Negative for dizziness, weakness and headaches.  A complete 10 system review of systems was obtained and all systems are negative except as noted in the HPI and PMH.      Allergies  Demerol; Epinephrine; Nardil; Morphine and related; Ondansetron; Shellfish allergy; and Soy allergy  Home Medications   Prior to Admission medications   Medication Sig Start Date End Date Taking? Authorizing Provider  amLODipine (NORVASC) 10 MG tablet Take 10 mg by mouth at bedtime.    Historical Provider, MD  bimatoprost (LUMIGAN) 0.01 % SOLN Place 1 drop into both eyes at bedtime.    Historical Provider, MD  cyanocobalamin 500 MCG tablet Take 500 mcg by mouth 3 (three) times a week.    Historical Provider, MD  diazepam (VALIUM) 10 MG tablet Take 10 mg by mouth every 6 (six) hours as needed for anxiety.    Historical Provider, MD  dimenhyDRINATE (DRAMAMINE) 50 MG tablet Take 50-100 mg by mouth every 8 (eight) hours as needed for nausea.    Historical Provider, MD  HYDROcodone-acetaminophen (NORCO) 7.5-325 MG per tablet Take 1 tablet by mouth every 6 (six) hours as needed for moderate pain or severe pain. For pain    Historical Provider, MD  levothyroxine (SYNTHROID, LEVOTHROID) 125 MCG tablet Take 125 mcg by mouth daily before breakfast.    Historical Provider, MD  ondansetron (  ZOFRAN ODT) 4 MG disintegrating tablet Take 1 tablet (4 mg total) by mouth every 4 (four) hours as needed for nausea or vomiting. Patient not taking: Reported on 05/04/2015 03/07/15   Arby BarretteMarcy Pfeiffer, MD  polyethylene glycol (MIRALAX / GLYCOLAX) packet Take 17 g by mouth daily. Patient not taking: Reported on 05/04/2015 03/07/15   Arby BarretteMarcy Pfeiffer, MD  prochlorperazine (COMPAZINE) 10 MG tablet Take 1  tablet (10 mg total) by mouth 2 (two) times daily as needed for nausea or vomiting. 05/25/15   Mercedes Camprubi-Soms, PA-C   BP 118/85 mmHg  Pulse 87  Temp(Src) 97.8 F (36.6 C) (Oral)  Resp 18  Ht 5\' 7"  (1.702 m)  Wt 214 lb (97.07 kg)  BMI 33.51 kg/m2  SpO2 94% Physical Exam  Constitutional: He is oriented to person, place, and time. He appears well-developed and well-nourished. No distress.  Disheveled, dirty Has chips and Pepsi from waiting room.  HENT:  Head: Normocephalic and atraumatic.  Mouth/Throat: Oropharynx is clear and moist. No oropharyngeal exudate.  Eyes: Conjunctivae and EOM are normal. Pupils are equal, round, and reactive to light.  Neck: Normal range of motion. Neck supple.  No meningismus.  Cardiovascular: Normal rate, regular rhythm, normal heart sounds and intact distal pulses.   No murmur heard. Pulmonary/Chest: Effort normal and breath sounds normal. No respiratory distress.  Abdominal: Soft. There is tenderness. There is no rebound and no guarding.  Left upper and lower quadrant pain without guarding or rebound. No appreciable hernias. No testicular tenderness  Musculoskeletal: Normal range of motion. He exhibits no edema or tenderness.  Neurological: He is alert and oriented to person, place, and time. No cranial nerve deficit. He exhibits normal muscle tone. Coordination normal.  No ataxia on finger to nose bilaterally. No pronator drift. 5/5 strength throughout. CN 2-12 intact.Equal grip strength. Sensation intact.   Skin: Skin is warm.  Psychiatric: He has a normal mood and affect. His behavior is normal.  Nursing note and vitals reviewed.   ED Course  Procedures (including critical care time) Labs Review Labs Reviewed  COMPREHENSIVE METABOLIC PANEL - Abnormal; Notable for the following:    Glucose, Bld 100 (*)    ALT 15 (*)    Total Bilirubin 1.3 (*)    All other components within normal limits  LIPASE, BLOOD  CBC  URINALYSIS, ROUTINE W  REFLEX MICROSCOPIC (NOT AT [facility])    Imaging Review No results found. I have personally reviewed and evaluated these images and lab results as part of my medical decision-making.   EKG Interpretation None      MDM   Final diagnoses:  Abdominal pain, unspecified abdominal location  Nausea   Acute on chronic abdominal pain and nausea. Abdomen is soft without peritoneal signs. [facility] showed umbilical hernia and left inguinal hernia both containing fat. No hernia appreciated on exam today.  Patient eating chips and drinking Pepsi.  Labwork is reassuring. Plan to obtain CT scan given patient's current visit for same issue. Patient refuses IV and refuses nausea meds and PPI.  Patient's friend has arrived. Patient no longer wants to wait in the ED for further evaluation and is stating that he is leaving. He will leave AGAINST MEDICAL ADVICE. He appears to have capacity to make medical decisions.  He is refusing further evaluation and leaving AMA.   Glynn OctaveStephen Sherman Donaldson, MD 11/21/15 [id number]

## 2015-11-21 NOTE — ED Notes (Signed)
Patient states he is not waiting any longer and will not accept any treatment, er md at bedside and instructed to patient importance to have treatment and patient denies the need and said he was leaving right now

## 2015-11-21 NOTE — ED Notes (Signed)
Pt here for LUQ pain that has been going on for a while. sts cramping, nausea and diarrhea.

## 2015-11-21 NOTE — ED Notes (Signed)
Pt refusing IV fluids and nausea medication.

## 2015-11-21 NOTE — ED Notes (Signed)
Pt states he does not need an iv or contrast with his ct er md notified

## 2016-01-05 ENCOUNTER — Emergency Department (HOSPITAL_COMMUNITY)
Admission: EM | Admit: 2016-01-05 | Discharge: 2016-01-05 | Disposition: A | Discharge status: Home / Self Care | Payer: Medicare Other | Attending: Emergency Medicine | Admitting: Emergency Medicine

## 2016-01-05 ENCOUNTER — Encounter (HOSPITAL_COMMUNITY): Payer: Self-pay | Admitting: Emergency Medicine

## 2016-01-05 ENCOUNTER — Encounter: Payer: Self-pay | Admitting: Gastroenterology

## 2016-01-05 DIAGNOSIS — Z79899 Other long term (current) drug therapy: Secondary | ICD-10-CM | POA: Insufficient documentation

## 2016-01-05 DIAGNOSIS — I10 Essential (primary) hypertension: Secondary | ICD-10-CM | POA: Diagnosis not present

## 2016-01-05 DIAGNOSIS — Z87891 Personal history of nicotine dependence: Secondary | ICD-10-CM | POA: Insufficient documentation

## 2016-01-05 DIAGNOSIS — R110 Nausea: Secondary | ICD-10-CM | POA: Diagnosis not present

## 2016-01-05 LAB — COMPREHENSIVE METABOLIC PANEL
ALBUMIN: 3.7 g/dL (ref 3.5–5.0)
ALT: 14 U/L — ABNORMAL LOW (ref 17–63)
ANION GAP: 8 (ref 5–15)
AST: 18 U/L (ref 15–41)
Alkaline Phosphatase: 61 U/L (ref 38–126)
BILIRUBIN TOTAL: 1.3 mg/dL — AB (ref 0.3–1.2)
BUN: 8 mg/dL (ref 6–20)
CO2: 24 mmol/L (ref 22–32)
Calcium: 8.7 mg/dL — ABNORMAL LOW (ref 8.9–10.3)
Chloride: 105 mmol/L (ref 101–111)
Creatinine, Ser: 1.2 mg/dL (ref 0.61–1.24)
GFR calc non Af Amer: 60 mL/min — ABNORMAL LOW (ref >60)
GLUCOSE: 106 mg/dL — AB (ref 65–99)
POTASSIUM: 3.8 mmol/L (ref 3.5–5.1)
Sodium: 137 mmol/L (ref 135–145)
TOTAL PROTEIN: 6.4 g/dL — AB (ref 6.5–8.1)

## 2016-01-05 LAB — LIPASE, BLOOD: Lipase: 19 U/L (ref 11–51)

## 2016-01-05 LAB — CBC
HEMATOCRIT: 41.8 % (ref 39.0–52.0)
HEMOGLOBIN: 13.9 g/dL (ref 13.0–17.0)
MCH: 29.8 pg (ref 26.0–34.0)
MCHC: 33.3 g/dL (ref 30.0–36.0)
MCV: 89.7 fL (ref 78.0–100.0)
Platelets: 154 10*3/uL (ref 150–400)
RBC: 4.66 MIL/uL (ref 4.22–5.81)
RDW: 12.5 % (ref 11.5–15.5)
WBC: 7.5 10*3/uL (ref 4.0–10.5)

## 2016-01-05 MED ORDER — ESOMEPRAZOLE MAGNESIUM 40 MG PO CPDR: 40.0000 mg | DELAYED_RELEASE_CAPSULE | Freq: Every day | ORAL | Status: DC

## 2016-01-05 MED ORDER — METOCLOPRAMIDE HCL 10 MG PO TABS: 10.0000 mg | ORAL_TABLET | Freq: Four times a day (QID) | ORAL | Status: DC

## 2016-01-05 NOTE — Discharge Instructions (Signed)
Nausea, Adult °Nausea is the feeling that you have an upset stomach or have to vomit. Nausea by itself is not likely a serious concern, but it may be an early sign of more serious medical problems. As nausea gets worse, it can lead to vomiting. If vomiting develops, there is the risk of dehydration.  °CAUSES  °· Viral infections. °· Food poisoning. °· Medicines. °· Pregnancy. °· Motion sickness. °· Migraine headaches. °· Emotional distress. °· Severe pain from any source. °· Alcohol intoxication. °HOME CARE INSTRUCTIONS °· Get plenty of rest. °· Ask your caregiver about specific rehydration instructions. °· Eat small amounts of food and sip liquids more often. °· Take all medicines as told by your caregiver. °SEEK MEDICAL CARE IF: °· You have not improved after 2 days, or you get worse. °· You have a headache. °SEEK IMMEDIATE MEDICAL CARE IF:  °· You have a fever. °· You faint. °· You keep vomiting or have blood in your vomit. °· You are extremely weak or dehydrated. °· You have dark or bloody stools. °· You have severe chest or abdominal pain. °MAKE SURE YOU: °· Understand these instructions. °· Will watch your condition. °· Will get help right away if you are not doing well or get worse. °  °This information is not intended to replace advice given to you by your health care provider. Make sure you discuss any questions you have with your health care provider. °  °Document Released: 08/12/2004 Document Revised: 07/26/2014 Document Reviewed: 03/17/2011 °Elsevier Interactive Patient Education ©2016 [employer] ° °

## 2016-01-05 NOTE — ED Notes (Addendum)
Nausea x 1 year or more-- was [facility] and states he refused tests-- pt states will let us do blood tests-- not sure about xrays-- "I am allergic to that dye"  Took 2 dramamine this am, has been taking it for nausea for over a year-- states has bright red blood in stools-- "Hemmorhoids" denies vomiting

## 2016-01-05 NOTE — ED Provider Notes (Signed)
CSN: [id number]     Arrival date & time 01/05/16  [id number] History   First MD Initiated Contact with Patient 01/05/16 1100     Chief Complaint  Patient presents with  . Nausea     (Consider location/radiation/quality/duration/timing/severity/associated sxs/prior Treatment) HPI Comments: Patient presents with nausea. He has chronic nausea which has been going on about one and half years. [facility] He's also been seen by GI. He's been tried on multiple anti-emetics. He states Dramamine works the best. He comes in today because he's continuing to have to take the Dramamine. He states he's her the same symptoms he's had before. He denies any abdominal pain. No fevers. No vomiting. He's having normal bowel movements other than he does have some constipation and occasional blood when he wipes secondary to hemorrhoids. He denies any fevers. When the nausea started he had a CT scan of his abdomen and pelvis which showed umbilical and inguinal hernias. He states he [facility] and was told that he didn't need surgery at this time. He states he saw his PCP 3 days ago and was given a prescription for meclizine which she doesn't feel as making his nausea better.   Past Medical History  Diagnosis Date  . Hypertension   . Anxiety   . Anxiety and depression   . Thyroid disease   . Inguinal hernia    History reviewed. No pertinent past surgical history. No family history on file. Social History  Substance Use Topics  . Smoking status: Former Games developermoker  . Smokeless tobacco: Former NeurosurgeonUser    Quit date: 10/22/2010  . Alcohol Use: No    Review of Systems  Constitutional: Negative for fever, chills, diaphoresis and fatigue.  HENT: Negative for congestion, rhinorrhea and sneezing.   Eyes: Negative.   Respiratory: Negative for cough, chest tightness and shortness of breath.   Cardiovascular: Negative for chest pain and leg swelling.  Gastrointestinal:  Positive for nausea and constipation. Negative for vomiting, abdominal pain, diarrhea and blood in stool.  Genitourinary: Negative for frequency, hematuria, flank pain and difficulty urinating.  Musculoskeletal: Negative for back pain and arthralgias.  Skin: Negative for rash.  Neurological: Negative for dizziness, speech difficulty, weakness, numbness and headaches.      Allergies  Demerol; Epinephrine; Nardil; Morphine and related; Ondansetron; Shellfish allergy; and Soy allergy  Home Medications   Prior to Admission medications   Medication Sig Start Date End Date Taking? Authorizing Provider  amLODipine (NORVASC) 10 MG tablet Take 10 mg by mouth at bedtime.    Historical Provider, MD  bimatoprost (LUMIGAN) 0.01 % SOLN Place 1 drop into both eyes at bedtime.    Historical Provider, MD  cyanocobalamin 500 MCG tablet Take 500 mcg by mouth 3 (three) times a week.    Historical Provider, MD  diazepam (VALIUM) 10 MG tablet Take 10 mg by mouth every 6 (six) hours as needed for anxiety.    Historical Provider, MD  dimenhyDRINATE (DRAMAMINE) 50 MG tablet Take 50-100 mg by mouth every 8 (eight) hours as needed for nausea.    Historical Provider, MD  esomeprazole (NEXIUM) 40 MG capsule Take 1 capsule (40 mg total) by mouth daily. 01/05/16   Rolan BuccoMelanie Lucile Didonato, MD  HYDROcodone-acetaminophen (NORCO) 7.5-325 MG per tablet Take 1 tablet by mouth every 6 (six) hours as needed for moderate pain or severe pain. For pain    Historical Provider, MD  levothyroxine (SYNTHROID, LEVOTHROID) 125 MCG tablet Take  125 mcg by mouth daily before breakfast.    Historical Provider, MD  metoCLOPramide (REGLAN) 10 MG tablet Take 1 tablet (10 mg total) by mouth every 6 (six) hours. 01/05/16   Rolan Bucco, MD  ondansetron (ZOFRAN ODT) 4 MG disintegrating tablet Take 1 tablet (4 mg total) by mouth every 4 (four) hours as needed for nausea or vomiting. Patient not taking: Reported on 05/04/2015 03/07/15   Arby Barrette, MD   polyethylene glycol (MIRALAX / GLYCOLAX) packet Take 17 g by mouth daily. Patient not taking: Reported on 05/04/2015 03/07/15   Arby Barrette, MD  prochlorperazine (COMPAZINE) 10 MG tablet Take 1 tablet (10 mg total) by mouth 2 (two) times daily as needed for nausea or vomiting. 05/25/15   Mercedes Camprubi-Soms, PA-C   BP 120/98 mmHg  Pulse 64  Temp(Src) 97.8 F (36.6 C) (Oral)  Resp 16  Ht  (1.702 m)  Wt 210 lb (95.255 kg)  BMI 32.88 kg/m2  SpO2 100% Physical Exam  Constitutional: He is oriented to person, place, and time. He appears well-developed and well-nourished.  HENT:  Head: Normocephalic and atraumatic.  Eyes: Pupils are equal, round, and reactive to light.  Neck: Normal range of motion. Neck supple.  Cardiovascular: Normal rate, regular rhythm and normal heart sounds.   Pulmonary/Chest: Effort normal and breath sounds normal. No respiratory distress. He has no wheezes. He has no rales. He exhibits no tenderness.  Abdominal: Soft. Bowel sounds are normal. There is no tenderness. There is no rebound and no guarding.  No palpable hernias, no tenderness  Musculoskeletal: Normal range of motion. He exhibits no edema.  Lymphadenopathy:    He has no cervical adenopathy.  Neurological: He is alert and oriented to person, place, and time.  Skin: Skin is warm and dry. No rash noted.  Psychiatric: He has a normal mood and affect.    ED Course  Procedures (including critical care time) Labs Review Labs Reviewed  COMPREHENSIVE METABOLIC PANEL - Abnormal; Notable for the following:    Glucose, Bld 106 (*)    Calcium 8.7 (*)    Total Protein 6.4 (*)    ALT 14 (*)    Total Bilirubin 1.3 (*)    GFR calc non Af Amer 60 (*)    All other components within normal limits  LIPASE, BLOOD  CBC    Imaging Review No results found. I have personally reviewed and evaluated these images and lab results as part of my medical decision-making.   EKG Interpretation None       MDM   Final diagnoses:  Nausea    Patient is well-appearing with chronic nausea. He doesn't have any new complaints. His abdominal exam is benign. He was discharged home in good condition. I encouraged him to follow-up with a gastroenterologist. He is requesting referral to a local gastroenterologist which I did. I gave him prescription for Reglan to try as he can't remember if he's tried this before. I also started him on Nexium. He was encouraged to follow-up with his PCP and a gastroenterologist.    Rolan Bucco, MD 01/05/16 [phone]

## 2016-01-05 NOTE — ED Notes (Signed)
Doctor at bedside.

## 2016-01-06 ENCOUNTER — Ambulatory Visit: Payer: Self-pay | Admitting: Gastroenterology

## 2016-01-08 ENCOUNTER — Emergency Department (HOSPITAL_COMMUNITY)
Admission: EM | Admit: 2016-01-08 | Discharge: 2016-01-09 | Disposition: A | Discharge status: Home / Self Care | Payer: Medicare Other | Attending: Emergency Medicine | Admitting: Emergency Medicine

## 2016-01-08 ENCOUNTER — Encounter (HOSPITAL_COMMUNITY): Payer: Self-pay | Admitting: Emergency Medicine

## 2016-01-08 DIAGNOSIS — R110 Nausea: Secondary | ICD-10-CM | POA: Diagnosis not present

## 2016-01-08 DIAGNOSIS — K8020 Calculus of gallbladder without cholecystitis without obstruction: Secondary | ICD-10-CM | POA: Diagnosis not present

## 2016-01-08 DIAGNOSIS — I10 Essential (primary) hypertension: Secondary | ICD-10-CM | POA: Diagnosis not present

## 2016-01-08 DIAGNOSIS — Z87891 Personal history of nicotine dependence: Secondary | ICD-10-CM | POA: Insufficient documentation

## 2016-01-08 LAB — COMPREHENSIVE METABOLIC PANEL
ALBUMIN: 3.8 g/dL (ref 3.5–5.0)
ALK PHOS: 64 U/L (ref 38–126)
ALT: 13 U/L — ABNORMAL LOW (ref 17–63)
ANION GAP: 6 (ref 5–15)
AST: 18 U/L (ref 15–41)
BILIRUBIN TOTAL: 0.9 mg/dL (ref 0.3–1.2)
BUN: 9 mg/dL (ref 6–20)
CO2: 29 mmol/L (ref 22–32)
Calcium: 8.9 mg/dL (ref 8.9–10.3)
Chloride: 103 mmol/L (ref 101–111)
Creatinine, Ser: 1.41 mg/dL — ABNORMAL HIGH (ref 0.61–1.24)
GFR calc Af Amer: 57 mL/min — ABNORMAL LOW (ref >60)
GFR calc non Af Amer: 49 mL/min — ABNORMAL LOW (ref >60)
GLUCOSE: 89 mg/dL (ref 65–99)
POTASSIUM: 3.6 mmol/L (ref 3.5–5.1)
SODIUM: 138 mmol/L (ref 135–145)
TOTAL PROTEIN: 6.4 g/dL — AB (ref 6.5–8.1)

## 2016-01-08 LAB — CBC
HEMATOCRIT: 41.5 % (ref 39.0–52.0)
HEMOGLOBIN: 14.1 g/dL (ref 13.0–17.0)
MCH: 30.3 pg (ref 26.0–34.0)
MCHC: 34 g/dL (ref 30.0–36.0)
MCV: 89.1 fL (ref 78.0–100.0)
Platelets: 157 10*3/uL (ref 150–400)
RBC: 4.66 MIL/uL (ref 4.22–5.81)
RDW: 12.6 % (ref 11.5–15.5)
WBC: 8 10*3/uL (ref 4.0–10.5)

## 2016-01-08 LAB — URINALYSIS, ROUTINE W REFLEX MICROSCOPIC
BILIRUBIN URINE: NEGATIVE
GLUCOSE, UA: NEGATIVE mg/dL
HGB URINE DIPSTICK: NEGATIVE
KETONES UR: NEGATIVE mg/dL
Leukocytes, UA: NEGATIVE
Nitrite: NEGATIVE
PH: 5.5 (ref 5.0–8.0)
Protein, ur: NEGATIVE mg/dL
SPECIFIC GRAVITY, URINE: 1.017 (ref 1.005–1.030)

## 2016-01-08 LAB — LIPASE, BLOOD: Lipase: 20 U/L (ref 11–51)

## 2016-01-08 NOTE — ED Notes (Signed)
Pt. reports chronic nausea for > 1 year , seen here this week for the same complaint prescribed with Reglan / Nexium and referral to a gastroenterologist , he arranged an appointment on August 22 this year .

## 2016-01-09 ENCOUNTER — Other Ambulatory Visit: Payer: Self-pay

## 2016-01-09 ENCOUNTER — Emergency Department (HOSPITAL_COMMUNITY): Payer: Medicare Other

## 2016-01-09 DIAGNOSIS — R110 Nausea: Secondary | ICD-10-CM | POA: Diagnosis not present

## 2016-01-09 NOTE — ED Notes (Signed)
Pt able to drink fluids without any nausea.

## 2016-01-09 NOTE — ED Provider Notes (Signed)
CSN: [id number]     Arrival date & time 01/08/16  2027 History  By signing my name below, I, Linna DarnerRussell Turner, attest that this documentation has been prepared under the direction and in the presence of physician practitioner, Shon Batonourtney F Oleta Gunnoe, MD. Electronically Signed: Linna Darnerussell Turner, Scribe. 01/09/2016. 12:14 AM.   Chief Complaint  Patient presents with  . Nausea    The history is provided by the patient. No language interpreter was used.     HPI Comments: Eric [name] is a 70 y.o. male who presents to the Emergency Department complaining of constant, worsening nausea over the last several days. He states that he has h/o chronic nausea for a little over one year. Pt was seen here on 01/05/16 with the same complaint and was prescribed Reglan and Nexium. Pt states that he has been going to [facility] for his symptoms since they began. He reports that he was diagnosed with inguinal and umbilical hernias shortly after he initially experienced nausea. He was also diagnosed with gallstones in the past. Pt has used Dramamine and OTC anti-nausea pills with mild relief. He reports that he had an appointment scheduled with [place] on 01/06/16 that was moved to 03/09/16, so he came to the ER tonight due to his "miserable" nausea. Pt was seen by his PCP on 01/02/16 and was prescribed Meclizine with no relief. He endorses nausea exacerbation with food intake. He also endorses mild pain with palpation to his abdomen. He denies chest pain, vomiting, or any other associated symptoms.  She has had multiple evaluations for the same. Presented today for help to get in to GI clinic earlier. Symptoms are unchanged. He does add that he has increased nausea with food intake with some mild abdominal pain. No imaging from previous visits.  Past Medical History  Diagnosis Date  . Hypertension   . Anxiety   . Anxiety and depression   . Thyroid disease   . Inguinal hernia    History reviewed. No pertinent past surgical  history. No family history on file. Social History  Substance Use Topics  . Smoking status: Former Games developermoker  . Smokeless tobacco: Former NeurosurgeonUser    Quit date: 10/22/2010  . Alcohol Use: No    Review of Systems  Constitutional: Negative for fever.  Cardiovascular: Negative for chest pain.  Gastrointestinal: Positive for nausea (chronic) and abdominal pain (with palpation). Negative for vomiting.  All other systems reviewed and are negative.  Allergies  Demerol; Epinephrine; Nardil; Morphine and related; Ondansetron; Shellfish allergy; and Soy allergy  Home Medications   Prior to Admission medications   Medication Sig Start Date End Date Taking? Authorizing Provider  amLODipine (NORVASC) 10 MG tablet Take 10 mg by mouth at bedtime.    Historical Provider, MD  bimatoprost (LUMIGAN) 0.01 % SOLN Place 1 drop into both eyes at bedtime.    Historical Provider, MD  cyanocobalamin 500 MCG tablet Take 500 mcg by mouth 3 (three) times a week.    Historical Provider, MD  diazepam (VALIUM) 10 MG tablet Take 10 mg by mouth every 6 (six) hours as needed for anxiety.    Historical Provider, MD  dimenhyDRINATE (DRAMAMINE) 50 MG tablet Take 50-100 mg by mouth every 8 (eight) hours as needed for nausea.    Historical Provider, MD  esomeprazole (NEXIUM) 40 MG capsule Take 1 capsule (40 mg total) by mouth daily. 01/05/16   Rolan BuccoMelanie Belfi, MD  HYDROcodone-acetaminophen (NORCO) 7.5-325 MG per tablet Take 1 tablet by mouth every 6 (six)  hours as needed for moderate pain or severe pain. For pain    Historical Provider, MD  levothyroxine (SYNTHROID, LEVOTHROID) 125 MCG tablet Take 125 mcg by mouth daily before breakfast.    Historical Provider, MD  metoCLOPramide (REGLAN) 10 MG tablet Take 1 tablet (10 mg total) by mouth every 6 (six) hours. 01/05/16   Rolan BuccoMelanie Belfi, MD  ondansetron (ZOFRAN ODT) 4 MG disintegrating tablet Take 1 tablet (4 mg total) by mouth every 4 (four) hours as needed for nausea or  vomiting. Patient not taking: Reported on 05/04/2015 03/07/15   Arby BarretteMarcy Pfeiffer, MD  polyethylene glycol (MIRALAX / GLYCOLAX) packet Take 17 g by mouth daily. Patient not taking: Reported on 05/04/2015 03/07/15   Arby BarretteMarcy Pfeiffer, MD  prochlorperazine (COMPAZINE) 10 MG tablet Take 1 tablet (10 mg total) by mouth 2 (two) times daily as needed for nausea or vomiting. 05/25/15   Mercedes Camprubi-Soms, PA-C   BP 102/69 mmHg  Pulse 70  Temp(Src) 98.2 F (36.8 C) (Oral)  Resp 16  Ht 5\' 7"  (1.702 m)  Wt 210 lb (95.255 kg)  BMI 32.88 kg/m2  SpO2 95% Physical Exam  Constitutional: He is oriented to person, place, and time. No distress.  HENT:  Head: Normocephalic and atraumatic.  Cardiovascular: Normal rate, regular rhythm and normal heart sounds.   No murmur heard. Pulmonary/Chest: Effort normal and breath sounds normal. No respiratory distress. He has no wheezes.  Abdominal: Soft. Bowel sounds are normal. There is no tenderness. There is no rebound and no guarding.  Musculoskeletal: He exhibits no edema.  Neurological: He is alert and oriented to person, place, and time.  Skin: Skin is warm and dry.  Psychiatric: He has a normal mood and affect.  Nursing note and vitals reviewed.   ED Course  Procedures (including critical care time)  DIAGNOSTIC STUDIES: Oxygen Saturation is 93% on RA, adequate by my interpretation.    COORDINATION OF CARE: 12:14 AM Discussed treatment plan with pt at bedside and pt agreed to plan.  Labs Review Labs Reviewed  COMPREHENSIVE METABOLIC PANEL - Abnormal; Notable for the following:    Creatinine, Ser 1.41 (*)    Total Protein 6.4 (*)    ALT 13 (*)    GFR calc non Af Amer 49 (*)    GFR calc Af Amer 57 (*)    All other components within normal limits  LIPASE, BLOOD  CBC  URINALYSIS, ROUTINE W REFLEX MICROSCOPIC (NOT AT [facility])    Imaging Review [place] Abdomen Limited Ruq  01/09/2016  CLINICAL DATA:  Initial evaluation for acute nausea for 3 days. EXAM:  US ABDOMEN LIMITED - RIGHT UPPER QUADRANT COMPARISON:  None. FINDINGS: Gallbladder: Gallbladder is contracted. Multiple shadowing echogenic stones present within the gallbladder lumen, largest of which measured 1.2 cm. Gallbladder wall measured within normal limits at 2.1 mm. No free pericholecystic fluid. No sonographic Murphy sign elicited on exam. Common bile duct: Diameter: 2.8 mm Liver: No focal lesion identified. Within normal limits in parenchymal echogenicity. Question mild nodularity of the hepatic contour. IMPRESSION: 1. Cholelithiasis without sonographic evidence for acute cholecystitis or biliary dilatation. 2. Question mild nodularity of the hepatic contour, which can be seen in the setting of cirrhosis. Correlation with LFTs recommended. Electronically Signed   By: Rise MuBenjamin  McClintock M.D.   On: 01/09/2016 02:58   I have personally reviewed and evaluated these images and lab results as part of my medical decision-making.   EKG Interpretation   Date/Time:  Friday January 09 2016 01:48:31 EDT  Ventricular Rate:  73 PR Interval:    QRS Duration: 82 QT Interval:  400 QTC Calculation: 441 R Axis:   54 Text Interpretation:  Sinus rhythm Anteroseptal infarct, old Confirmed by  Shawn Dannenberg  MD, Trenace Coughlin ([id number]) on 01/09/2016 1:57:01 AM      MDM   Final diagnoses:  Nausea  Calculus of gallbladder without cholecystitis without obstruction    Patient presents with nausea. Ongoing and worsening for 1 year. He has had multiple prior evaluations including earlier this week. He is been unable to get in precipitous with the GI clinic. He reports worsening nausea today. Also reports intermittent abdominal pain. Pain is related with food intake. Question gallbladder pathology. He is relatively nontender on exam; however, given multiple prior evaluations without imaging, will obtain an ultrasound. Lab work is reassuring including LFTs. Ultrasound does show gallstones without evidence of cholecystitis.  Referred to surgery as well. I encouraged the patient to call the GI clinic to try to arrange for an earlier appointment.  Patient was able to tolerate fluids.  After history, exam, and medical workup I feel the patient has been appropriately medically screened and is safe for discharge home. Pertinent diagnoses were discussed with the patient. Patient was given return precautions.  I personally performed the services described in this documentation, which was scribed in my presence. The recorded information has been reviewed and is accurate.   Shon Baton, MD 01/09/16 [id number]

## 2016-01-09 NOTE — Discharge Instructions (Signed)
You were seen today for nausea. This is been ongoing. It is worse with meals. You need to follow-up with GI as previously scheduled. Call the office for an earlier appointment. You were noted on your ultrasound to have gallstones. There are no obstructing stones. This could be contributing to her nausea. He will be given the number for surgical follow-up. See return precautions below.  Nausea, Adult Nausea is the feeling that you have an upset stomach or have to vomit. Nausea by itself is not likely a serious concern, but it may be an early sign of more serious medical problems. As nausea gets worse, it can lead to vomiting. If vomiting develops, there is the risk of dehydration.  CAUSES   Viral infections.  Food poisoning.  Medicines.  Pregnancy.  Motion sickness.  Migraine headaches.  Emotional distress.  Severe pain from any source.  Alcohol intoxication. HOME CARE INSTRUCTIONS  Get plenty of rest.  Ask your caregiver about specific rehydration instructions.  Eat small amounts of food and sip liquids more often.  Take all medicines as told by your caregiver. SEEK MEDICAL CARE IF:  You have not improved after 2 days, or you get worse.  You have a headache. SEEK IMMEDIATE MEDICAL CARE IF:   You have a fever.  You faint.  You keep vomiting or have blood in your vomit.  You are extremely weak or dehydrated.  You have dark or bloody stools.  You have severe chest or abdominal pain. MAKE SURE YOU:  Understand these instructions.  Will watch your condition.  Will get help right away if you are not doing well or get worse.   This information is not intended to replace advice given to you by your health care provider. Make sure you discuss any questions you have with your health care provider.   Document Released: 08/12/2004 Document Revised: 07/26/2014 Document Reviewed: 03/17/2011 Elsevier Interactive Patient Education 2016 Elsevier Inc.   Biliary  Colic Biliary colic is a pain in the upper abdomen. The pain:  Is usually felt on the right side of the abdomen, but it may also be felt in the center of the abdomen, just below the breastbone (sternum).  May spread back toward the right shoulder blade.  May be steady or irregular.  May be accompanied by nausea and vomiting. Most of the time, the pain goes away in 1-5 hours. After the most intense pain passes, the abdomen may continue to ache mildly for about 24 hours. Biliary colic is caused by a blockage in the bile duct. The bile duct is a pathway that carries bile--a liquid that helps to digest fats--from the gallbladder to the small intestine. Biliary colic usually occurs after eating, when the digestive system demands bile. The pain develops when muscle cells contract forcefully to try to move the blockage so that bile can get by. HOME CARE INSTRUCTIONS  Take medicines only as directed by your health care provider.  Drink enough fluid to keep your urine clear or pale yellow.  Avoid fatty, greasy, and fried foods. These kinds of foods increase your body's demand for bile.  Avoid any foods that make your pain worse.  Avoid overeating.  Avoid having a large meal after fasting. SEEK MEDICAL CARE IF:  You develop a fever.  Your pain gets worse.  You vomit.  You develop nausea that prevents you from eating and drinking. SEEK IMMEDIATE MEDICAL CARE IF:  You suddenly develop a fever and shaking chills.  You develop a yellowish  discoloration (jaundice) of:  Skin.  Whites of the eyes.  Mucous membranes.  You have continuous or severe pain that is not relieved with medicines.  You have nausea and vomiting that is not relieved with medicines.  You develop dizziness or you faint.   This information is not intended to replace advice given to you by your health care provider. Make sure you discuss any questions you have with your health care provider.   Document Released:  12/06/2005 Document Revised: 11/19/2014 Document Reviewed: 04/16/2014 Elsevier Interactive Patient Education [employer]2016 Elsevier Inc.

## 2016-01-09 NOTE — ED Notes (Signed)
Patient transported to Ultrasound 

## 2016-01-17 ENCOUNTER — Emergency Department (HOSPITAL_COMMUNITY)
Admission: EM | Admit: 2016-01-17 | Discharge: 2016-01-17 | Disposition: A | Discharge status: Home / Self Care | Payer: Medicare Other | Attending: Emergency Medicine | Admitting: Emergency Medicine

## 2016-01-17 ENCOUNTER — Encounter (HOSPITAL_COMMUNITY): Payer: Self-pay | Admitting: *Deleted

## 2016-01-17 DIAGNOSIS — R109 Unspecified abdominal pain: Secondary | ICD-10-CM | POA: Diagnosis not present

## 2016-01-17 DIAGNOSIS — R110 Nausea: Secondary | ICD-10-CM | POA: Diagnosis not present

## 2016-01-17 DIAGNOSIS — I10 Essential (primary) hypertension: Secondary | ICD-10-CM | POA: Diagnosis not present

## 2016-01-17 DIAGNOSIS — Z87891 Personal history of nicotine dependence: Secondary | ICD-10-CM | POA: Diagnosis not present

## 2016-01-17 DIAGNOSIS — Z5321 Procedure and treatment not carried out due to patient leaving prior to being seen by health care provider: Secondary | ICD-10-CM | POA: Diagnosis not present

## 2016-01-17 LAB — COMPREHENSIVE METABOLIC PANEL
ALT: 14 U/L — AB (ref 17–63)
AST: 20 U/L (ref 15–41)
Albumin: 3.6 g/dL (ref 3.5–5.0)
Alkaline Phosphatase: 64 U/L (ref 38–126)
Anion gap: 5 (ref 5–15)
BUN: 11 mg/dL (ref 6–20)
CALCIUM: 8.9 mg/dL (ref 8.9–10.3)
CHLORIDE: 103 mmol/L (ref 101–111)
CO2: 31 mmol/L (ref 22–32)
CREATININE: 1.09 mg/dL (ref 0.61–1.24)
GFR calc non Af Amer: >60 mL/min (ref >60)
Glucose, Bld: 144 mg/dL — ABNORMAL HIGH (ref 65–99)
Potassium: 3.8 mmol/L (ref 3.5–5.1)
SODIUM: 139 mmol/L (ref 135–145)
TOTAL PROTEIN: 6.7 g/dL (ref 6.5–8.1)
Total Bilirubin: 1.1 mg/dL (ref 0.3–1.2)

## 2016-01-17 LAB — CBC
HCT: 44 % (ref 39.0–52.0)
Hemoglobin: 14.9 g/dL (ref 13.0–17.0)
MCH: 31 pg (ref 26.0–34.0)
MCHC: 33.9 g/dL (ref 30.0–36.0)
MCV: 91.7 fL (ref 78.0–100.0)
PLATELETS: 177 10*3/uL (ref 150–400)
RBC: 4.8 MIL/uL (ref 4.22–5.81)
RDW: 12.5 % (ref 11.5–15.5)
WBC: 9.4 10*3/uL (ref 4.0–10.5)

## 2016-01-17 LAB — LIPASE, BLOOD: LIPASE: 16 U/L (ref 11–51)

## 2016-01-17 NOTE — ED Notes (Signed)
Pt has been called multiple times in lobby with no answer. 

## 2016-01-17 NOTE — ED Notes (Signed)
Tried to move pt to room assignment, unable to locate pt in lobby

## 2016-01-17 NOTE — ED Notes (Signed)
Pt was here on 6/22 for same. Reports mild abd pain and nausea. Last bm was Thursday. Pt was told it was related to gallbladder and referred to md but lost his paperwork. No acute distress noted at triage.

## 2016-01-17 NOTE — ED Notes (Signed)
Pt reports being constipated. Having abd pain and nausea. Last bm was Thursday.

## 2016-03-09 ENCOUNTER — Ambulatory Visit (INDEPENDENT_AMBULATORY_CARE_PROVIDER_SITE_OTHER): Payer: Medicare Other | Admitting: Gastroenterology

## 2016-03-09 ENCOUNTER — Encounter: Payer: Self-pay | Admitting: Gastroenterology

## 2016-03-09 VITALS — BP 112/70 | HR 84 | Ht 66.0 in | Wt 218.2 lb

## 2016-03-09 DIAGNOSIS — K8020 Calculus of gallbladder without cholecystitis without obstruction: Secondary | ICD-10-CM | POA: Diagnosis not present

## 2016-03-09 DIAGNOSIS — K5900 Constipation, unspecified: Secondary | ICD-10-CM

## 2016-03-09 DIAGNOSIS — R110 Nausea: Secondary | ICD-10-CM | POA: Diagnosis not present

## 2016-03-09 NOTE — Patient Instructions (Signed)
If you are age 665 or older, your body mass index should be between 23-30. Your Body mass index is 35.23 kg/m. If this is out of the aforementioned range listed, please consider follow up with your Primary Care Provider.  If you are age 70 or younger, your body mass index should be between 19-25. Your Body mass index is 35.23 kg/m. If this is out of the aformentioned range listed, please consider follow up with your Primary Care Provider.   Thank you for choosing [facility]  Dr Amada JupiterHenry Danis III

## 2016-03-09 NOTE — Progress Notes (Signed)
[facility] Consult Note:  History: Eric [name] 03/09/2016  Referring physician: Dortha KernBLISS, LAURA K, MD  Reason for consult/chief complaint: Nausea (recent ED, been taking dramamine for 1 1/2 year) and Cholelithiasis (abnormal US)   Subjective  HPI:  This patient was referred by primary care after multiple ED visits for nausea. He is a rather limited and tangential historian. As near as I can tell, he has been to our ED at least several times in the last 6 months complaining of nausea. He says it is not as if he is going to vomit, he just feels "queasy". It is not clear exactly what he means by this. He reports [facility] over the last year and a half of the symptoms, it is not clear he is ever been seen by GI physician or had any specific testing. On at least 2 occasions in our ED he left AGAINST MEDICAL ADVICE because "the wait was too long". He denies dysphagia or weight loss vomiting. He has occasional constipation and denies rectal bleeding. He does not believe he has ever had a colonoscopy done, but then admits that his memory is not always reliable since he took some medicines for anxiety in the 1970s. He has an odd affect, at times avoidant and at times agitated  ROS:  Review of Systems  Constitutional: Negative for appetite change and unexpected weight change.  HENT: Negative for mouth sores and voice change.   Eyes: Negative for pain and redness.  Respiratory: Negative for cough and shortness of breath.   Cardiovascular: Negative for chest pain and palpitations.  Genitourinary: Negative for dysuria and hematuria.  Musculoskeletal: Negative for arthralgias and myalgias.  Skin: Negative for pallor and rash.  Neurological: Negative for weakness and headaches.  Hematological: Negative for adenopathy.  Psychiatric/Behavioral: Positive for dysphoric mood. The patient is nervous/anxious.      Past Medical History: Past Medical  History:  Diagnosis Date  . Anxiety and depression   . Cataract   . Gallstones   . Glaucoma   . Hypertension   . Hypoglycemia   . Inguinal hernia   . Sleep apnea   . Thyroid disease      Past Surgical History: Past Surgical History:  Procedure Laterality Date  . FINGER AMPUTATION Right      Family History: Family History  Problem Relation Age of Onset  . Alzheimer's disease Mother     Social History: Social History   Social History  . Marital status: Divorced    Spouse name: N/A  . Number of children: 3  . Years of education: N/A   Social History Main Topics  . Smoking status: Former Smoker    Quit date: 10/18/2010  . Smokeless tobacco: Never Used  . Alcohol use No  . Drug use: No  . Sexual activity: Not Asked   Other Topics Concern  . None   Social History Narrative  . None    Allergies: Allergies  Allergen Reactions  . Demerol [Meperidine] Other (See Comments)    Will counteract with a drug he is taking causing fatal reaction with nardil  . Epinephrine Other (See Comments)    Nardil reaction-With epi?  . Nardil [Phenelzine] Other (See Comments)  . Morphine And Related Other (See Comments)    Reacts with nardil  . Ondansetron     Made patient want to climb the walls  . Shellfish Allergy Hives  . Soy Allergy Hives  Outpatient Meds: Current Outpatient Prescriptions  Medication Sig Dispense Refill  . amLODipine (NORVASC) 10 MG tablet Take 10 mg by mouth at bedtime.    . bimatoprost (LUMIGAN) 0.01 % SOLN Place 1 drop into both eyes at bedtime.    . cyanocobalamin 500 MCG tablet Take 500 mcg by mouth 3 (three) times a week.    . diazepam (VALIUM) 10 MG tablet Take 10 mg by mouth every 6 (six) hours as needed for anxiety.    . dimenhyDRINATE (DRAMAMINE) 50 MG tablet Take 50-100 mg by mouth every 8 (eight) hours as needed for nausea.    Marland Kitchen. HYDROcodone-acetaminophen (NORCO) 7.5-325 MG per tablet Take 1 tablet by mouth every 6 (six) hours as needed  for moderate pain or severe pain. For pain    . levothyroxine (SYNTHROID, LEVOTHROID) 137 MCG tablet Take 137 mcg by mouth daily before breakfast.     No current facility-administered medications for this visit.       ___________________________________________________________________ Objective   Exam:  BP 112/70 (BP Location: Left Arm, Patient Position: Sitting, Cuff Size: Normal)   Pulse 84   Ht 5\' 6"  (1.676 m) Comment: height measured without shoes  Wt 218 lb 4 oz (99 kg)   BMI 35.23 kg/m    General: this is a(n) disheveled and unkempt elderly man   Eyes: sclera anicteric, no redness  ENT: oral mucosa moist without lesions, no cervical or supraclavicular lymphadenopathy, good dentition  CV: RRR without murmur, S1/S2, no JVD, no peripheral edema  Resp: clear to auscultation bilaterally, normal RR and effort noted  GI: Obese, soft, no tenderness, with active bowel sounds. No guarding or palpable organomegaly noted. Exam limited by abdominal girth  Skin; warm and dry, no rash or jaundice noted  Neuro: awake, alert and oriented x 3. Normal gross motor function and fluent speech  Labs:  CBC    Component Value Date/Time   WBC 9.4 01/17/2016 1348   RBC 4.80 01/17/2016 1348   HGB 14.9 01/17/2016 1348   HCT 44.0 01/17/2016 1348   PLT 177 01/17/2016 1348   MCV 91.7 01/17/2016 1348   MCH 31.0 01/17/2016 1348   MCHC 33.9 01/17/2016 1348   RDW 12.5 01/17/2016 1348   LYMPHSABS 1.3 08/17/2011 2120   MONOABS 0.7 08/17/2011 2120   EOSABS 0.1 08/17/2011 2120   BASOSABS 0.0 08/17/2011 2120   CMP Latest Ref Rng & Units 01/17/2016 01/08/2016 01/05/2016  Glucose 65 - 99 mg/dL 161(W144(H) 89 960(A106(H)  BUN 6 - 20 mg/dL 11 9 8   Creatinine 0.61 - 1.24 mg/dL 5.401.09 9.81(X1.41(H) 9.141.20  Sodium 135 - 145 mmol/L 139 138 137  Potassium 3.5 - 5.1 mmol/L 3.8 3.6 3.8  Chloride 101 - 111 mmol/L 103 103 105  CO2 22 - 32 mmol/L 31 29 24   Calcium 8.9 - 10.3 mg/dL 8.9 8.9 7.8(G8.7(L)  Total Protein 6.5 - 8.1  g/dL 6.7 6.4(L) 6.4(L)  Total Bilirubin 0.3 - 1.2 mg/dL 1.1 0.9 9.5(A1.3(H)  Alkaline Phos 38 - 126 U/L 64 64 61  AST 15 - 41 U/L 20 18 18   ALT 17 - 63 U/L 14(L) 13(L) 14(L)     Radiologic Studies:  Abd US:  multiple gallstones, largest > 1cm  Assessment: Encounter Diagnoses  Name Primary?  . Nausea without vomiting Yes  . Constipation, unspecified constipation type   . Gallstones     His symptoms are quite vague and he states the only thing that helps him is Dramamine, which he takes up to 3  times per day. His gallstones appear to be unrelated, he does not describe any symptoms that sound like biliary colic. As an aside, he also appears to have mental illness  Plan:  I offered him an upper endoscopy and a colonoscopy to workup his symptoms, but he became very anxious about that, and often attention to about how the hospital out in [place] "is a killer hospital". He reports that too people he knows he went there died. He has read about the risks of endoscopic procedures and is not sure he wants to proceed. These risks were outlined to him in detail.  He preferred to give it some further consideration and contact me if he decides to pursue an endoscopic workup.  Thank you for the courtesy of this consult.  Please call me with any questions or concerns.  Over half of the 45 minute encounter was spent in counseling and coordination of care and record review.     Charlie Pitter [name]  CC: BLISS, Doreene Nest, MD

## 2016-03-10 ENCOUNTER — Telehealth: Payer: Self-pay | Admitting: Gastroenterology

## 2016-03-10 NOTE — Telephone Encounter (Signed)
Pt notified that the allergies are correct, it states that he cannot take the meds if he is on Nardil.  He states he has been OFF of the nardil for 1 year and 1/2.

## 2016-04-11 ENCOUNTER — Encounter (HOSPITAL_COMMUNITY): Payer: Self-pay | Admitting: *Deleted

## 2016-04-11 ENCOUNTER — Emergency Department (HOSPITAL_COMMUNITY)
Admission: EM | Admit: 2016-04-11 | Discharge: 2016-04-11 | Disposition: A | Discharge status: Home / Self Care | Payer: Medicare Other | Attending: Emergency Medicine | Admitting: Emergency Medicine

## 2016-04-11 DIAGNOSIS — I10 Essential (primary) hypertension: Secondary | ICD-10-CM | POA: Insufficient documentation

## 2016-04-11 DIAGNOSIS — L03116 Cellulitis of left lower limb: Secondary | ICD-10-CM | POA: Insufficient documentation

## 2016-04-11 DIAGNOSIS — M7989 Other specified soft tissue disorders: Secondary | ICD-10-CM | POA: Diagnosis present

## 2016-04-11 DIAGNOSIS — Z87891 Personal history of nicotine dependence: Secondary | ICD-10-CM | POA: Diagnosis not present

## 2016-04-11 DIAGNOSIS — R600 Localized edema: Secondary | ICD-10-CM

## 2016-04-11 LAB — CBC WITH DIFFERENTIAL/PLATELET
BASOS ABS: 0 10*3/uL (ref 0.0–0.1)
Basophils Relative: 0 %
EOS ABS: 0.2 10*3/uL (ref 0.0–0.7)
EOS PCT: 2 %
HCT: 41.6 % (ref 39.0–52.0)
Hemoglobin: 14 g/dL (ref 13.0–17.0)
LYMPHS PCT: 12 %
Lymphs Abs: 1.1 10*3/uL (ref 0.7–4.0)
MCH: 31 pg (ref 26.0–34.0)
MCHC: 33.7 g/dL (ref 30.0–36.0)
MCV: 92 fL (ref 78.0–100.0)
MONO ABS: 0.6 10*3/uL (ref 0.1–1.0)
Monocytes Relative: 6 %
Neutro Abs: 7.3 10*3/uL (ref 1.7–7.7)
Neutrophils Relative %: 80 %
PLATELETS: 178 10*3/uL (ref 150–400)
RBC: 4.52 MIL/uL (ref 4.22–5.81)
RDW: 12.4 % (ref 11.5–15.5)
WBC: 9 10*3/uL (ref 4.0–10.5)

## 2016-04-11 LAB — COMPREHENSIVE METABOLIC PANEL
ALT: 13 U/L — AB (ref 17–63)
AST: 21 U/L (ref 15–41)
Albumin: 3.6 g/dL (ref 3.5–5.0)
Alkaline Phosphatase: 70 U/L (ref 38–126)
Anion gap: 8 (ref 5–15)
BUN: 9 mg/dL (ref 6–20)
CHLORIDE: 99 mmol/L — AB (ref 101–111)
CO2: 28 mmol/L (ref 22–32)
Calcium: 8.4 mg/dL — ABNORMAL LOW (ref 8.9–10.3)
Creatinine, Ser: 1.32 mg/dL — ABNORMAL HIGH (ref 0.61–1.24)
GFR, EST NON AFRICAN AMERICAN: 53 mL/min — AB (ref >60)
Glucose, Bld: 113 mg/dL — ABNORMAL HIGH (ref 65–99)
POTASSIUM: 3.6 mmol/L (ref 3.5–5.1)
SODIUM: 135 mmol/L (ref 135–145)
Total Bilirubin: 0.9 mg/dL (ref 0.3–1.2)
Total Protein: 6.3 g/dL — ABNORMAL LOW (ref 6.5–8.1)

## 2016-04-11 LAB — I-STAT TROPONIN, ED: TROPONIN I, POC: 0 ng/mL (ref 0.00–0.08)

## 2016-04-11 LAB — BRAIN NATRIURETIC PEPTIDE: B NATRIURETIC PEPTIDE 5: 27.8 pg/mL (ref 0.0–100.0)

## 2016-04-11 NOTE — ED Provider Notes (Addendum)
[facility] DEPT Provider Note   CSN: [id number] Arrival date & time: 04/11/16  2011     History   Chief Complaint Chief Complaint  Patient presents with  . Leg Swelling    HPI Eric [name] is a 70 y.o. male.  The history is provided by the patient.  CC: leg swelling and redness  Onset/Duration: 2 weeks Timing: constant Location: BLE Quality: swelling and redness Severity: moderate Modifying Factors:  Improved by: lasix and augmentin  Worsened by: nothing Associated Signs/Symptoms:  Pertinent (+): nothing  Pertinent (-): fevers, chills, trauma, wounds, pain,   Past Medical History:  Diagnosis Date  . Anxiety and depression   . Cataract   . Gallstones   . Glaucoma   . Hypertension   . Hypoglycemia   . Inguinal hernia   . Sleep apnea   . Thyroid disease     There are no active problems to display for this patient.   Past Surgical History:  Procedure Laterality Date  . FINGER AMPUTATION Right        Home Medications    Prior to Admission medications   Medication Sig Start Date End Date Taking? Authorizing Provider  amLODipine (NORVASC) 10 MG tablet Take 10 mg by mouth at bedtime.    Historical Provider, MD  bimatoprost (LUMIGAN) 0.01 % SOLN Place 1 drop into both eyes at bedtime.    Historical Provider, MD  cyanocobalamin 500 MCG tablet Take 500 mcg by mouth 3 (three) times a week.    Historical Provider, MD  diazepam (VALIUM) 10 MG tablet Take 10 mg by mouth every 6 (six) hours as needed for anxiety.    Historical Provider, MD  dimenhyDRINATE (DRAMAMINE) 50 MG tablet Take 50-100 mg by mouth every 8 (eight) hours as needed for nausea.    Historical Provider, MD  HYDROcodone-acetaminophen (NORCO) 7.5-325 MG per tablet Take 1 tablet by mouth every 6 (six) hours as needed for moderate pain or severe pain. For pain    Historical Provider, MD  levothyroxine (SYNTHROID, LEVOTHROID) 137 MCG tablet Take 137 mcg by mouth daily before breakfast.     Historical Provider, MD    Family History Family History  Problem Relation Age of Onset  . Alzheimer's disease Mother     Social History Social History  Substance Use Topics  . Smoking status: Former Smoker    Quit date: 10/18/2010  . Smokeless tobacco: Never Used  . Alcohol use No     Allergies   Demerol [meperidine]; Epinephrine; Nardil [phenelzine]; Morphine and related; Ondansetron; Shellfish allergy; and Soy allergy   Review of Systems Review of Systems  Constitutional: Negative for chills and fever.  HENT: Negative for ear pain and sore throat.   Eyes: Negative for pain and visual disturbance.  Respiratory: Negative for cough and shortness of breath.   Cardiovascular: Positive for leg swelling. Negative for chest pain and palpitations.  Gastrointestinal: Negative for abdominal pain and vomiting.  Genitourinary: Negative for dysuria and hematuria.  Musculoskeletal: Negative for arthralgias and back pain.  Skin: Negative for color change and rash.  Neurological: Negative for seizures and syncope.  All other systems reviewed and are negative.    Physical Exam Updated Vital Signs BP 138/86   Pulse 81   Temp 97.9 F (36.6 C) (Oral)   Resp 15   Ht 5\' 7"  (1.702 m)   Wt 219 lb 3.2 oz (99.4 kg)   SpO2 95%   BMI 34.33 kg/m   Physical Exam  Constitutional: He  is oriented to person, place, and time. He appears well-developed and well-nourished. No distress.  HENT:  Head: Normocephalic and atraumatic.  Nose: Nose normal.  Eyes: Conjunctivae and EOM are normal. Pupils are equal, round, and reactive to light. Right eye exhibits no discharge. Left eye exhibits no discharge. No scleral icterus.  Neck: Normal range of motion. Neck supple.  Cardiovascular: Normal rate and regular rhythm.  Exam reveals no gallop and no friction rub.   No murmur heard. Pulmonary/Chest: Effort normal and breath sounds normal. No stridor. No respiratory distress. He has no rales.    Abdominal: Soft. He exhibits no distension. There is no tenderness.  Musculoskeletal: He exhibits no edema or tenderness.  Bilateral lower extremity with 2+ pitting edema. Hypernatremia/erythema of the left lower extremity.  Neurological: He is alert and oriented to person, place, and time.  Skin: Skin is warm and dry. No rash noted. He is not diaphoretic. No erythema.  Psychiatric: He has a normal mood and affect.  Vitals reviewed.    ED Treatments / Results  Labs (all labs ordered are listed, but only abnormal results are displayed) Labs Reviewed  COMPREHENSIVE METABOLIC PANEL - Abnormal; Notable for the following:       Result Value   Chloride 99 (*)    Glucose, Bld 113 (*)    Creatinine, Ser 1.32 (*)    Calcium 8.4 (*)    Total Protein 6.3 (*)    ALT 13 (*)    GFR calc non Af Amer 53 (*)    All other components within normal limits  CBC WITH DIFFERENTIAL/PLATELET  BRAIN NATRIURETIC PEPTIDE  I-STAT TROPOININ, ED    EKG  EKG Interpretation  Date/Time:  Sunday April 11 2016 20:36:17 EDT Ventricular Rate:  88 PR Interval:  178 QRS Duration: 70 QT Interval:  360 QTC Calculation: 435 R Axis:   55 Text Interpretation:  Normal sinus rhythm Septal infarct , age undetermined Abnormal ECG Confirmed by [facility]CARDAMA MD, PEDRO ([id number]) on 04/11/2016 10:08:14 PM       Radiology No results found.  Procedures Procedures (including critical care time)  Medications Ordered in ED Medications - No data to display   Initial Impression / Assessment and Plan / ED Course  I have reviewed the triage vital signs and the nursing notes.  Pertinent labs & imaging results that were available during my care of the patient were reviewed by me and considered in my medical decision making (see chart for details).  Clinical Course    BLE edema currently being treated for cellulitis with Amoxicillin and edema treated with lasix. No changes. Here for second opinion. Agree with regimen.    Low suscipision for DVT.   The patient is safe for discharge with strict return precautions.   Final Clinical Impressions(s) / ED Diagnoses   Final diagnoses:  Bilateral edema of lower extremity  Cellulitis of left lower extremity   Disposition: Discharge  Condition: Good  I have discussed the results, Dx and Tx plan with the patient who expressed understanding and agree(s) with the plan. Discharge instructions discussed at great length. The patient was given strict return precautions who verbalized understanding of the instructions. No further questions at time of discharge.    Current Discharge Medication List      Follow Up: Dortha KernLaura K Bliss, MD [address] [place] [id number] [phone]  Call  As needed        Nira ConnPedro Eduardo Cardama, MD 04/16/16 [phone]

## 2016-04-11 NOTE — ED Triage Notes (Signed)
Patient presents with c/o bilateral leg swelling for 2-3 weeks

## 2016-05-24 ENCOUNTER — Emergency Department (HOSPITAL_COMMUNITY)
Admission: EM | Admit: 2016-05-24 | Discharge: 2016-05-24 | Discharge status: Against Medical Advice | Payer: Medicare Other | Attending: Emergency Medicine | Admitting: Emergency Medicine

## 2016-05-24 ENCOUNTER — Encounter (HOSPITAL_COMMUNITY): Payer: Self-pay

## 2016-05-24 DIAGNOSIS — R0602 Shortness of breath: Secondary | ICD-10-CM | POA: Insufficient documentation

## 2016-05-24 DIAGNOSIS — Z79899 Other long term (current) drug therapy: Secondary | ICD-10-CM | POA: Diagnosis not present

## 2016-05-24 DIAGNOSIS — Z87891 Personal history of nicotine dependence: Secondary | ICD-10-CM | POA: Diagnosis not present

## 2016-05-24 DIAGNOSIS — M7989 Other specified soft tissue disorders: Secondary | ICD-10-CM | POA: Diagnosis present

## 2016-05-24 DIAGNOSIS — R600 Localized edema: Secondary | ICD-10-CM | POA: Insufficient documentation

## 2016-05-24 DIAGNOSIS — I10 Essential (primary) hypertension: Secondary | ICD-10-CM | POA: Diagnosis not present

## 2016-05-24 MED ORDER — FUROSEMIDE 10 MG/ML IJ SOLN: 40.0000 mg | Freq: Once | INTRAMUSCULAR | Status: DC

## 2016-05-24 MED ORDER — ALBUTEROL SULFATE (2.5 MG/3ML) 0.083% IN NEBU: 5.0000 mg | INHALATION_SOLUTION | Freq: Once | RESPIRATORY_TRACT | Status: DC

## 2016-05-24 NOTE — ED Triage Notes (Signed)
Pt reports bilateral lower leg redness and swelling. Pt reports he has been taking amoxicillin. He reports now he has a strong cough. No coughing noted in triage.

## 2016-05-24 NOTE — ED Provider Notes (Signed)
[facility] Provider Note   CSN: [id number] Arrival date & time: 05/24/16  0830     History   Chief Complaint Chief Complaint  Patient presents with  . Leg Swelling    HPI Eric [name] is a 70 y.o. male.  Pt presents to the ED with SOB, cough, and leg swelling.  The pt said that he's seen his doctor for his cough and has been on 2 rounds of amoxicillin.  The pt also said that he has not been taking his lasix.   The pt denies f/c.      Past Medical History:  Diagnosis Date  . Anxiety and depression   . Cataract   . Gallstones   . Glaucoma   . Hypertension   . Hypoglycemia   . Inguinal hernia   . Sleep apnea   . Thyroid disease     There are no active problems to display for this patient.   Past Surgical History:  Procedure Laterality Date  . FINGER AMPUTATION Right        Home Medications    Prior to Admission medications   Medication Sig Start Date End Date Taking? Authorizing Provider  amLODipine (NORVASC) 10 MG tablet Take 10 mg by mouth at bedtime.    Historical Provider, MD  bimatoprost (LUMIGAN) 0.01 % SOLN Place 1 drop into both eyes at bedtime.    Historical Provider, MD  cyanocobalamin 500 MCG tablet Take 500 mcg by mouth 3 (three) times a week.    Historical Provider, MD  diazepam (VALIUM) 10 MG tablet Take 10 mg by mouth every 6 (six) hours as needed for anxiety.    Historical Provider, MD  dimenhyDRINATE (DRAMAMINE) 50 MG tablet Take 50-100 mg by mouth every 8 (eight) hours as needed for nausea.    Historical Provider, MD  HYDROcodone-acetaminophen (NORCO) 7.5-325 MG per tablet Take 1 tablet by mouth every 6 (six) hours as needed for moderate pain or severe pain. For pain    Historical Provider, MD  levothyroxine (SYNTHROID, LEVOTHROID) 137 MCG tablet Take 137 mcg by mouth daily before breakfast.    Historical Provider, MD    Family History Family History  Problem Relation Age of Onset  . Alzheimer's disease Mother     Social  History Social History  Substance Use Topics  . Smoking status: Former Smoker    Quit date: 10/18/2010  . Smokeless tobacco: Never Used  . Alcohol use No     Allergies   Demerol [meperidine]; Epinephrine; Nardil [phenelzine]; Morphine and related; Ondansetron; Shellfish allergy; and Soy allergy   Review of Systems Review of Systems  Respiratory: Positive for cough, shortness of breath and wheezing.   Cardiovascular: Positive for leg swelling.  All other systems reviewed and are negative.    Physical Exam Updated Vital Signs BP 137/82 (BP Location: Left Arm)   Pulse 96   Temp 98.2 F (36.8 C) (Oral)   Resp 18   Ht 5\' 7"  (1.702 m)   Wt 220 lb (99.8 kg)   SpO2 96%   BMI 34.46 kg/m   Physical Exam  Constitutional: He is oriented to person, place, and time. He appears well-developed and well-nourished.  HENT:  Head: Normocephalic and atraumatic.  Right Ear: External ear normal.  Left Ear: External ear normal.  Nose: Nose normal.  Mouth/Throat: Oropharynx is clear and moist.  Eyes: Conjunctivae and EOM are normal. Pupils are equal, round, and reactive to light.  Neck: Normal range of motion. Neck supple.  Cardiovascular: Normal rate, regular rhythm, normal heart sounds and intact distal pulses.   Pulmonary/Chest: Tachypnea noted. He has wheezes.  Abdominal: Soft. Bowel sounds are normal.  Musculoskeletal: He exhibits edema.  Neurological: He is alert and oriented to person, place, and time.  Skin: Skin is warm.  Psychiatric: He has a normal mood and affect. His behavior is normal. Judgment and thought content normal.  Nursing note and vitals reviewed.    ED Treatments / Results  Labs (all labs ordered are listed, but only abnormal results are displayed) Labs Reviewed - No data to display  EKG  EKG Interpretation None       Radiology No results found.  Procedures Procedures (including critical care time)  Medications Ordered in ED Medications - No  data to display   Initial Impression / Assessment and Plan / ED Course  I have reviewed the triage vital signs and the nursing notes.  Pertinent labs & imaging results that were available during my care of the patient were reviewed by me and considered in my medical decision making (see chart for details).  Clinical Course    After my exam, I told the pt that we would put him on a cardiac monitor and check his heart and lungs, we would get some blood work, and give him a breathing treatment.  When the nurse went to do his IV.  He was fully dressed and wanted to go.  He refused all care.  She told me, and I spoke with patient.  Pt told me that he does not like drugs and did not want us to do anything.  He said he would take his lasix.  I tried to convince him to stay for eval, but he would not.  He is able to make his own decisions.  He will sign AMA.   He knows to return at any time.  He knows to f/u with his doctor.   Final Clinical Impressions(s) / ED Diagnoses   Final diagnoses:  Bilateral lower extremity edema  SOB (shortness of breath)    New Prescriptions New Prescriptions   No medications on file     Jacalyn LefevreJulie Lakenya Riendeau, MD 05/24/16 [phone]

## 2016-05-24 NOTE — Discharge Instructions (Signed)
Please take your lasix.  Return at any time.

## 2016-05-24 NOTE — ED Notes (Signed)
RN to room to assess pt.  Pt is fully dressed and reports he his leaving because "I don't want all that stuff she was talking about. I have a full bottle of Lasix over there."  RN was able to speak with pt and he agreed to speak with MD. MD at bedside.

## 2018-07-19 DIAGNOSIS — Z7901 Long term (current) use of anticoagulants: Secondary | ICD-10-CM

## 2018-07-19 HISTORY — DX: Long term (current) use of anticoagulants: Z79.01

## 2018-07-20 ENCOUNTER — Emergency Department (HOSPITAL_COMMUNITY): Payer: Medicare Other

## 2018-07-20 ENCOUNTER — Emergency Department (HOSPITAL_COMMUNITY)
Admission: EM | Admit: 2018-07-20 | Discharge: 2018-07-21 | Disposition: A | Discharge status: Home / Self Care | Payer: Medicare Other | Attending: Emergency Medicine | Admitting: Emergency Medicine

## 2018-07-20 DIAGNOSIS — Z5321 Procedure and treatment not carried out due to patient leaving prior to being seen by health care provider: Secondary | ICD-10-CM | POA: Insufficient documentation

## 2018-07-20 DIAGNOSIS — R2241 Localized swelling, mass and lump, right lower limb: Secondary | ICD-10-CM | POA: Diagnosis present

## 2018-07-20 LAB — CBG MONITORING, ED: Glucose-Capillary: 86 mg/dL (ref 70–99)

## 2018-07-20 NOTE — ED Notes (Signed)
No answer for vitals recheck x3 

## 2018-07-20 NOTE — ED Triage Notes (Signed)
Pt reports he fell 3 weeks ago and hurt right leg and has been having issues walking, reports he has been having difficulty with his blood sugar dropping and has to eat lots of "sugar" to bring it up.

## 2018-07-21 NOTE — ED Notes (Signed)
This RN went to check on patient who was sitting on the side of the bed. Patient had taken leads, BP, and gown off. States he doesn't want to be seen anymore and is leaving.

## 2018-07-25 DIAGNOSIS — I4891 Unspecified atrial fibrillation: Secondary | ICD-10-CM

## 2018-07-25 HISTORY — DX: Unspecified atrial fibrillation: I48.91

## 2018-10-28 ENCOUNTER — Encounter: Payer: Self-pay | Admitting: Emergency Medicine

## 2018-10-28 ENCOUNTER — Other Ambulatory Visit: Payer: Self-pay

## 2018-10-28 ENCOUNTER — Inpatient Hospital Stay
Admission: EM | Admit: 2018-10-28 | Discharge: 2018-11-03 | DRG: 189 | Disposition: A | Discharge status: Skilled Nursing Facility | Payer: Medicare Other | Source: Skilled Nursing Facility | Attending: Internal Medicine | Admitting: Internal Medicine

## 2018-10-28 ENCOUNTER — Emergency Department: Payer: Medicare Other

## 2018-10-28 DIAGNOSIS — J9601 Acute respiratory failure with hypoxia: Secondary | ICD-10-CM | POA: Diagnosis present

## 2018-10-28 DIAGNOSIS — R6889 Other general symptoms and signs: Secondary | ICD-10-CM

## 2018-10-28 DIAGNOSIS — Z820 Family history of epilepsy and other diseases of the nervous system: Secondary | ICD-10-CM

## 2018-10-28 DIAGNOSIS — R0603 Acute respiratory distress: Secondary | ICD-10-CM

## 2018-10-28 DIAGNOSIS — Z20828 Contact with and (suspected) exposure to other viral communicable diseases: Secondary | ICD-10-CM | POA: Diagnosis present

## 2018-10-28 DIAGNOSIS — Z885 Allergy status to narcotic agent status: Secondary | ICD-10-CM

## 2018-10-28 DIAGNOSIS — F411 Generalized anxiety disorder: Secondary | ICD-10-CM | POA: Diagnosis present

## 2018-10-28 DIAGNOSIS — Z9981 Dependence on supplemental oxygen: Secondary | ICD-10-CM

## 2018-10-28 DIAGNOSIS — F05 Delirium due to known physiological condition: Secondary | ICD-10-CM | POA: Diagnosis present

## 2018-10-28 DIAGNOSIS — Z993 Dependence on wheelchair: Secondary | ICD-10-CM

## 2018-10-28 DIAGNOSIS — T50916A Underdosing of multiple unspecified drugs, medicaments and biological substances, initial encounter: Secondary | ICD-10-CM | POA: Diagnosis present

## 2018-10-28 DIAGNOSIS — Z79899 Other long term (current) drug therapy: Secondary | ICD-10-CM

## 2018-10-28 DIAGNOSIS — R410 Disorientation, unspecified: Secondary | ICD-10-CM | POA: Diagnosis present

## 2018-10-28 DIAGNOSIS — Z91128 Patient's intentional underdosing of medication regimen for other reason: Secondary | ICD-10-CM

## 2018-10-28 DIAGNOSIS — I872 Venous insufficiency (chronic) (peripheral): Secondary | ICD-10-CM | POA: Diagnosis present

## 2018-10-28 DIAGNOSIS — F329 Major depressive disorder, single episode, unspecified: Secondary | ICD-10-CM | POA: Diagnosis present

## 2018-10-28 DIAGNOSIS — J9621 Acute and chronic respiratory failure with hypoxia: Secondary | ICD-10-CM | POA: Diagnosis not present

## 2018-10-28 DIAGNOSIS — N492 Inflammatory disorders of scrotum: Secondary | ICD-10-CM | POA: Diagnosis present

## 2018-10-28 DIAGNOSIS — E039 Hypothyroidism, unspecified: Secondary | ICD-10-CM | POA: Diagnosis present

## 2018-10-28 DIAGNOSIS — L03314 Cellulitis of groin: Secondary | ICD-10-CM

## 2018-10-28 DIAGNOSIS — Z79890 Hormone replacement therapy (postmenopausal): Secondary | ICD-10-CM

## 2018-10-28 DIAGNOSIS — L0390 Cellulitis, unspecified: Secondary | ICD-10-CM

## 2018-10-28 DIAGNOSIS — E8809 Other disorders of plasma-protein metabolism, not elsewhere classified: Secondary | ICD-10-CM | POA: Diagnosis present

## 2018-10-28 DIAGNOSIS — F0391 Unspecified dementia with behavioral disturbance: Secondary | ICD-10-CM | POA: Diagnosis present

## 2018-10-28 DIAGNOSIS — R739 Hyperglycemia, unspecified: Secondary | ICD-10-CM | POA: Diagnosis present

## 2018-10-28 DIAGNOSIS — G4730 Sleep apnea, unspecified: Secondary | ICD-10-CM | POA: Diagnosis present

## 2018-10-28 DIAGNOSIS — Z89021 Acquired absence of right finger(s): Secondary | ICD-10-CM

## 2018-10-28 DIAGNOSIS — I110 Hypertensive heart disease with heart failure: Secondary | ICD-10-CM | POA: Diagnosis present

## 2018-10-28 DIAGNOSIS — Z9114 Patient's other noncompliance with medication regimen: Secondary | ICD-10-CM

## 2018-10-28 DIAGNOSIS — Z888 Allergy status to other drugs, medicaments and biological substances status: Secondary | ICD-10-CM

## 2018-10-28 DIAGNOSIS — I5022 Chronic systolic (congestive) heart failure: Secondary | ICD-10-CM | POA: Diagnosis present

## 2018-10-28 DIAGNOSIS — Z87891 Personal history of nicotine dependence: Secondary | ICD-10-CM

## 2018-10-28 DIAGNOSIS — J449 Chronic obstructive pulmonary disease, unspecified: Secondary | ICD-10-CM

## 2018-10-28 DIAGNOSIS — J9612 Chronic respiratory failure with hypercapnia: Secondary | ICD-10-CM | POA: Diagnosis present

## 2018-10-28 DIAGNOSIS — R0602 Shortness of breath: Secondary | ICD-10-CM | POA: Diagnosis not present

## 2018-10-28 DIAGNOSIS — J441 Chronic obstructive pulmonary disease with (acute) exacerbation: Secondary | ICD-10-CM | POA: Diagnosis present

## 2018-10-28 DIAGNOSIS — Z79891 Long term (current) use of opiate analgesic: Secondary | ICD-10-CM

## 2018-10-28 DIAGNOSIS — Z20822 Contact with and (suspected) exposure to covid-19: Secondary | ICD-10-CM | POA: Diagnosis present

## 2018-10-28 DIAGNOSIS — T424X6A Underdosing of benzodiazepines, initial encounter: Secondary | ICD-10-CM | POA: Diagnosis present

## 2018-10-28 DIAGNOSIS — R601 Generalized edema: Secondary | ICD-10-CM

## 2018-10-28 DIAGNOSIS — I4891 Unspecified atrial fibrillation: Secondary | ICD-10-CM | POA: Diagnosis present

## 2018-10-28 DIAGNOSIS — F324 Major depressive disorder, single episode, in partial remission: Secondary | ICD-10-CM | POA: Diagnosis present

## 2018-10-28 LAB — CBC WITH DIFFERENTIAL/PLATELET
Abs Immature Granulocytes: 0.12 10*3/uL — ABNORMAL HIGH (ref 0.00–0.07)
Basophils Absolute: 0 10*3/uL (ref 0.0–0.1)
Basophils Relative: 0 %
Eosinophils Absolute: 0.2 10*3/uL (ref 0.0–0.5)
Eosinophils Relative: 1 %
HCT: 39.7 % (ref 39.0–52.0)
Hemoglobin: 13 g/dL (ref 13.0–17.0)
Immature Granulocytes: 1 %
Lymphocytes Relative: 5 %
Lymphs Abs: 0.8 10*3/uL (ref 0.7–4.0)
MCH: 30.6 pg (ref 26.0–34.0)
MCHC: 32.7 g/dL (ref 30.0–36.0)
MCV: 93.4 fL (ref 80.0–100.0)
Monocytes Absolute: 1.1 10*3/uL — ABNORMAL HIGH (ref 0.1–1.0)
Monocytes Relative: 8 %
Neutro Abs: 12.5 10*3/uL — ABNORMAL HIGH (ref 1.7–7.7)
Neutrophils Relative %: 85 %
Platelets: 212 10*3/uL (ref 150–400)
RBC: 4.25 MIL/uL (ref 4.22–5.81)
RDW: 13.8 % (ref 11.5–15.5)
WBC: 14.7 10*3/uL — ABNORMAL HIGH (ref 4.0–10.5)
nRBC: 0 % (ref 0.0–0.2)

## 2018-10-28 LAB — TROPONIN I: Troponin I: <0.03 ng/mL (ref <0.03)

## 2018-10-28 LAB — COMPREHENSIVE METABOLIC PANEL
ALT: 13 U/L (ref 0–44)
AST: 16 U/L (ref 15–41)
Albumin: 3.3 g/dL — ABNORMAL LOW (ref 3.5–5.0)
Alkaline Phosphatase: 69 U/L (ref 38–126)
Anion gap: 7 (ref 5–15)
BUN: 15 mg/dL (ref 8–23)
CO2: 29 mmol/L (ref 22–32)
Calcium: 8.2 mg/dL — ABNORMAL LOW (ref 8.9–10.3)
Chloride: 99 mmol/L (ref 98–111)
Creatinine, Ser: 1 mg/dL (ref 0.61–1.24)
GFR calc Af Amer: >60 mL/min (ref >60)
GFR calc non Af Amer: >60 mL/min (ref >60)
Glucose, Bld: 153 mg/dL — ABNORMAL HIGH (ref 70–99)
Potassium: 4.3 mmol/L (ref 3.5–5.1)
Sodium: 135 mmol/L (ref 135–145)
Total Bilirubin: 1.1 mg/dL (ref 0.3–1.2)
Total Protein: 7.1 g/dL (ref 6.5–8.1)

## 2018-10-28 LAB — BRAIN NATRIURETIC PEPTIDE: B Natriuretic Peptide: 16 pg/mL (ref 0.0–100.0)

## 2018-10-28 MED ORDER — SODIUM CHLORIDE 0.9 % IV SOLN
2.0000 g | Freq: Once | INTRAVENOUS | Status: AC
  Administered 2018-10-29: 2 g via INTRAVENOUS
  Filled 2018-10-28: qty 2

## 2018-10-28 MED ORDER — DIAZEPAM 5 MG PO TABS
ORAL_TABLET | ORAL | Status: AC
  Administered 2018-10-28: 23:00:00
  Filled 2018-10-28: qty 1

## 2018-10-28 MED ORDER — VANCOMYCIN HCL IN DEXTROSE 1-5 GM/200ML-% IV SOLN
1000.0000 mg | Freq: Once | INTRAVENOUS | Status: AC
  Administered 2018-10-29: 01:00:00 1000 mg via INTRAVENOUS
  Filled 2018-10-28: qty 200

## 2018-10-28 MED ORDER — FUROSEMIDE 10 MG/ML IJ SOLN
60.0000 mg | Freq: Once | INTRAMUSCULAR | Status: AC
  Administered 2018-10-28: 60 mg via INTRAVENOUS
  Filled 2018-10-28: qty 8

## 2018-10-28 MED ORDER — ALBUTEROL SULFATE HFA 108 (90 BASE) MCG/ACT IN AERS
2.0000 | INHALATION_SPRAY | Freq: Once | RESPIRATORY_TRACT | Status: AC
  Administered 2018-10-29: 2 via RESPIRATORY_TRACT
  Filled 2018-10-28: qty 6.7

## 2018-10-28 NOTE — ED Provider Notes (Signed)
[facility] Emergency Department Provider Note    First MD Initiated Contact with Patient 10/28/18 2256     (approximate)  I have reviewed the triage vital signs and the nursing notes.   HISTORY  Chief Complaint Shortness of Breath  Level V Caveat:  Anxiety - unable to provide additional history  HPI Harbert Demichele III is a 73 y.o. male below listed past medical history with recent hospitalization at [facility] for respiratory failure congestive heart failure presents to the ER for lower extremity swelling shortness of breath.  Patient evaluated by EMS found to be hypoxic in the low 80s.   Denies abdominal pain.  Very anxious and refusing medications. Patient was not able to tolerate CPAP.  No recent fevers.  Coming from the [place] minutes.   Echo DUH:   EF 50%    Past Medical History:  Diagnosis Date  . Anxiety and depression   . Cataract   . Gallstones   . Glaucoma   . Hypertension   . Hypoglycemia   . Inguinal hernia   . Sleep apnea   . Thyroid disease    Family History  Problem Relation Age of Onset  . Alzheimer's disease Mother    Past Surgical History:  Procedure Laterality Date  . FINGER AMPUTATION Right    There are no active problems to display for this patient.     Prior to Admission medications   Medication Sig Start Date End Date Taking? Authorizing Provider  amLODipine (NORVASC) 10 MG tablet Take 10 mg by mouth at bedtime.    [provider]  bimatoprost (LUMIGAN) 0.01 % SOLN Place 1 drop into both eyes at bedtime.    [provider]  cyanocobalamin 500 MCG tablet Take 500 mcg by mouth 3 (three) times a week.    [provider]  diazepam (VALIUM) 10 MG tablet Take 10 mg by mouth every 6 (six) hours as needed for anxiety.    [provider]  dimenhyDRINATE (DRAMAMINE) 50 MG tablet Take 50-100 mg by mouth every 8 (eight) hours as needed for nausea.    [provider]   HYDROcodone-acetaminophen (NORCO) 7.5-325 MG per tablet Take 1 tablet by mouth every 6 (six) hours as needed for moderate pain or severe pain. For pain    [provider]  levothyroxine (SYNTHROID, LEVOTHROID) 137 MCG tablet Take 137 mcg by mouth daily before breakfast.    [provider]    Allergies Demerol [meperidine]; Epinephrine; Nardil [phenelzine]; Morphine and related; Ondansetron; Shellfish allergy; and Soy allergy    Social History Social History   Tobacco Use  . Smoking status: Former Smoker    Last attempt to quit: 10/18/2010    Years since quitting: 8.0  . Smokeless tobacco: Never Used  Substance Use Topics  . Alcohol use: No  . Drug use: No    Review of Systems Patient denies headaches, rhinorrhea, blurry vision, numbness, shortness of breath, chest pain, edema, cough, abdominal pain, nausea, vomiting, diarrhea, dysuria, fevers, rashes or hallucinations unless otherwise stated above in HPI. ____________________________________________   PHYSICAL EXAM:  VITAL SIGNS: Vitals:   10/28/18 2307 10/28/18 2330  BP: (!) 156/90 (!) 149/97  Pulse: (!) 102 97  Resp: 16   Temp: 99 F (37.2 C)   SpO2: 96% 98%    Constitutional: Alert and oriented. Anxious appearing in moderate respiratory distress Eyes: Conjunctivae are normal.  Head: Atraumatic. Nose: No congestion/rhinnorhea. Mouth/Throat: Mucous membranes are moist.   Neck: No  stridor. Painless ROM.  Cardiovascular: Normal rate, regular rhythm. Grossly normal heart sounds.  Good peripheral circulation. Respiratory: tachypnea with use of accessory muscles.  Crackles posterior bilaterally Gastrointestinal: Soft and nontender. No distention. No abdominal bruits. No CVA tenderness. Genitourinary: scrotal swelling and edema without crepitus or blisters Musculoskeletal: No lower extremity tenderness.  2+ BLE.  No joint effusions. Neurologic:  Normal speech and language. No gross focal neurologic  deficits are appreciated. No facial droop Skin:  Skin is warm, dry and intact. No rash noted. Psychiatric: Mood and affect are anxious. ____________________________________________   LABS (all labs ordered are listed, but only abnormal results are displayed)  Results for orders placed or performed during the hospital encounter of 10/28/18 (from the past 24 hour(s))  CBC with Differential/Platelet     Status: Abnormal   Collection Time: 10/28/18 10:50 PM  Result Value Ref Range   WBC 14.7 (H) 4.0 - 10.5 K/uL   RBC 4.25 4.22 - 5.81 MIL/uL   Hemoglobin 13.0 13.0 - 17.0 g/dL   HCT 16.139.7 09.639.0 - 04.552.0 %   MCV 93.4 80.0 - 100.0 fL   MCH 30.6 26.0 - 34.0 pg   MCHC 32.7 30.0 - 36.0 g/dL   RDW 40.913.8 81.111.5 - 91.415.5 %   Platelets 212 150 - 400 K/uL   nRBC 0.0 0.0 - 0.2 %   Neutrophils Relative % 85 %   Neutro Abs 12.5 (H) 1.7 - 7.7 K/uL   Lymphocytes Relative 5 %   Lymphs Abs 0.8 0.7 - 4.0 K/uL   Monocytes Relative 8 %   Monocytes Absolute 1.1 (H) 0.1 - 1.0 K/uL   Eosinophils Relative 1 %   Eosinophils Absolute 0.2 0.0 - 0.5 K/uL   Basophils Relative 0 %   Basophils Absolute 0.0 0.0 - 0.1 K/uL   Immature Granulocytes 1 %   Abs Immature Granulocytes 0.12 (H) 0.00 - 0.07 K/uL  Comprehensive metabolic panel     Status: Abnormal   Collection Time: 10/28/18 10:50 PM  Result Value Ref Range   Sodium 135 135 - 145 mmol/L   Potassium 4.3 3.5 - 5.1 mmol/L   Chloride 99 98 - 111 mmol/L   CO2 29 22 - 32 mmol/L   Glucose, Bld 153 (H) 70 - 99 mg/dL   BUN 15 8 - 23 mg/dL   Creatinine, Ser 7.821.00 0.61 - 1.24 mg/dL   Calcium 8.2 (L) 8.9 - 10.3 mg/dL   Total Protein 7.1 6.5 - 8.1 g/dL   Albumin 3.3 (L) 3.5 - 5.0 g/dL   AST 16 15 - 41 U/L   ALT 13 0 - 44 U/L   Alkaline Phosphatase 69 38 - 126 U/L   Total Bilirubin 1.1 0.3 - 1.2 mg/dL   GFR calc non Af Amer >60 >60 mL/min   GFR calc Af Amer >60 >60 mL/min   Anion gap 7 5 - 15  Brain natriuretic peptide     Status: None   Collection Time: 10/28/18  10:50 PM  Result Value Ref Range   B Natriuretic Peptide 16.0 0.0 - 100.0 pg/mL  Troponin I - ONCE - STAT     Status: None   Collection Time: 10/28/18 10:50 PM  Result Value Ref Range   Troponin I <0.03 <0.03 ng/mL   ____________________________________________  EKG My review and personal interpretation at Time: 22:57   Indication: sob  Rate: 105  Rhythm: sinus Axis: normal Other: normal ____________________________________________  RADIOLOGY  I personally reviewed all radiographic images ordered to evaluate  for the above acute complaints and reviewed radiology reports and findings.  These findings were personally discussed with the patient.  Please see medical record for radiology report.  ____________________________________________   PROCEDURES  Procedure(s) performed:  Procedures    Critical Care performed: yes ____________________________________________   INITIAL IMPRESSION / ASSESSMENT AND PLAN / ED COURSE  Pertinent labs & imaging results that were available during my care of the patient were reviewed by me and considered in my medical decision making (see chart for details).   DDX: Asthma, copd, CHF, pna, ptx, malignancy, Pe, anemia   Uthman Mroczkowski III is a 72 y.o. who presents to the ED with shortness of breath and acute respiratory failure as described above.  Patient refusing CPAP but after some coaxing was able to calm him down and get a good waveform on 3 L nasal cannula at 96%.  Patient is oriented but very anxious.  Bedside ultrasound and echo shows B-lines bilaterally.  Does have crackles on exam concerning for heart failure.  Mildly hypertensive.  We will continue with supplemental oxygen.  The patient will be placed on continuous pulse oximetry and telemetry for monitoring.  Laboratory evaluation will be sent to evaluate for the above complaints.     Clinical Course as of Oct 28 8  Sat Oct 28, 2018  2317 Patient improving.  MAR shows from the [facility]  that he has been refusing all medication since April 1.     [PR]  2340 Patient reassessed.  Patient agreeable to receiving Lasix which he clearly needs due to significant edema with no hypoxia.  Will also cover for healthcare associated pneumonia given mild leukocytosis.  Not febrile at this time.   [PR]    Clinical Course User Index [PR] Willy Eddy, MD    The patient was evaluated in Emergency Department today for the symptoms described in the history of present illness. He/she was evaluated in the context of the global COVID-19 pandemic, which necessitated consideration that the patient might be at risk for infection with the SARS-CoV-2 virus that causes COVID-19. Institutional protocols and algorithms that pertain to the evaluation of patients at risk for COVID-19 are in a state of rapid change based on information released by regulatory bodies including the CDC and federal and state organizations. These policies and algorithms were followed during the patient's care in the ED.  As part of my medical decision making, I reviewed the following data within the electronic MEDICAL RECORD NUMBER Nursing notes reviewed and incorporated, Labs reviewed, notes from prior ED visits and [place] Controlled Substance Database   ____________________________________________   FINAL CLINICAL IMPRESSION(S) / ED DIAGNOSES  Final diagnoses:  Acute respiratory distress  Anasarca      NEW MEDICATIONS STARTED DURING THIS VISIT:  New Prescriptions   No medications on file     Note:  This document was prepared using Dragon voice recognition software and may include unintentional dictation errors.    Willy Eddy, MD 10/29/18 0010

## 2018-10-28 NOTE — ED Triage Notes (Signed)
Patient arrives from [facility] with complaints of urinary retention and shortness of breath- EMS reports saturations of 83% on RA. Patient arrives with increased work of breathing and agitation/confusion. BP 202/104 en route, patient refused all medications and IV insertion via EMS

## 2018-10-29 ENCOUNTER — Encounter: Payer: Self-pay | Admitting: Internal Medicine

## 2018-10-29 ENCOUNTER — Inpatient Hospital Stay: Payer: Medicare Other

## 2018-10-29 DIAGNOSIS — F3341 Major depressive disorder, recurrent, in partial remission: Secondary | ICD-10-CM | POA: Diagnosis not present

## 2018-10-29 DIAGNOSIS — R739 Hyperglycemia, unspecified: Secondary | ICD-10-CM | POA: Diagnosis present

## 2018-10-29 DIAGNOSIS — Z20822 Contact with and (suspected) exposure to covid-19: Secondary | ICD-10-CM | POA: Diagnosis present

## 2018-10-29 DIAGNOSIS — J9601 Acute respiratory failure with hypoxia: Secondary | ICD-10-CM | POA: Diagnosis present

## 2018-10-29 DIAGNOSIS — R410 Disorientation, unspecified: Secondary | ICD-10-CM

## 2018-10-29 DIAGNOSIS — Z993 Dependence on wheelchair: Secondary | ICD-10-CM | POA: Diagnosis not present

## 2018-10-29 DIAGNOSIS — T424X6A Underdosing of benzodiazepines, initial encounter: Secondary | ICD-10-CM | POA: Diagnosis present

## 2018-10-29 DIAGNOSIS — Z9981 Dependence on supplemental oxygen: Secondary | ICD-10-CM | POA: Diagnosis not present

## 2018-10-29 DIAGNOSIS — Z91128 Patient's intentional underdosing of medication regimen for other reason: Secondary | ICD-10-CM | POA: Diagnosis not present

## 2018-10-29 DIAGNOSIS — F329 Major depressive disorder, single episode, unspecified: Secondary | ICD-10-CM | POA: Diagnosis present

## 2018-10-29 DIAGNOSIS — F29 Unspecified psychosis not due to a substance or known physiological condition: Secondary | ICD-10-CM

## 2018-10-29 DIAGNOSIS — J441 Chronic obstructive pulmonary disease with (acute) exacerbation: Secondary | ICD-10-CM | POA: Diagnosis present

## 2018-10-29 DIAGNOSIS — L03314 Cellulitis of groin: Secondary | ICD-10-CM | POA: Diagnosis present

## 2018-10-29 DIAGNOSIS — J9621 Acute and chronic respiratory failure with hypoxia: Secondary | ICD-10-CM | POA: Diagnosis present

## 2018-10-29 DIAGNOSIS — N492 Inflammatory disorders of scrotum: Secondary | ICD-10-CM | POA: Diagnosis present

## 2018-10-29 DIAGNOSIS — I110 Hypertensive heart disease with heart failure: Secondary | ICD-10-CM | POA: Diagnosis present

## 2018-10-29 DIAGNOSIS — F339 Major depressive disorder, recurrent, unspecified: Secondary | ICD-10-CM | POA: Diagnosis not present

## 2018-10-29 DIAGNOSIS — R0602 Shortness of breath: Secondary | ICD-10-CM | POA: Diagnosis present

## 2018-10-29 DIAGNOSIS — E039 Hypothyroidism, unspecified: Secondary | ICD-10-CM | POA: Diagnosis present

## 2018-10-29 DIAGNOSIS — F05 Delirium due to known physiological condition: Secondary | ICD-10-CM | POA: Diagnosis present

## 2018-10-29 DIAGNOSIS — F411 Generalized anxiety disorder: Secondary | ICD-10-CM | POA: Diagnosis present

## 2018-10-29 DIAGNOSIS — F19230 Other psychoactive substance dependence with withdrawal, uncomplicated: Secondary | ICD-10-CM | POA: Diagnosis not present

## 2018-10-29 DIAGNOSIS — Z20828 Contact with and (suspected) exposure to other viral communicable diseases: Secondary | ICD-10-CM | POA: Diagnosis present

## 2018-10-29 DIAGNOSIS — E8809 Other disorders of plasma-protein metabolism, not elsewhere classified: Secondary | ICD-10-CM | POA: Diagnosis present

## 2018-10-29 DIAGNOSIS — I4891 Unspecified atrial fibrillation: Secondary | ICD-10-CM | POA: Diagnosis present

## 2018-10-29 DIAGNOSIS — J9612 Chronic respiratory failure with hypercapnia: Secondary | ICD-10-CM | POA: Diagnosis present

## 2018-10-29 DIAGNOSIS — F0391 Unspecified dementia with behavioral disturbance: Secondary | ICD-10-CM | POA: Diagnosis present

## 2018-10-29 DIAGNOSIS — G4730 Sleep apnea, unspecified: Secondary | ICD-10-CM | POA: Diagnosis present

## 2018-10-29 DIAGNOSIS — R6889 Other general symptoms and signs: Secondary | ICD-10-CM

## 2018-10-29 DIAGNOSIS — I872 Venous insufficiency (chronic) (peripheral): Secondary | ICD-10-CM | POA: Diagnosis present

## 2018-10-29 DIAGNOSIS — T50916A Underdosing of multiple unspecified drugs, medicaments and biological substances, initial encounter: Secondary | ICD-10-CM | POA: Diagnosis present

## 2018-10-29 DIAGNOSIS — I5022 Chronic systolic (congestive) heart failure: Secondary | ICD-10-CM | POA: Diagnosis present

## 2018-10-29 LAB — LACTIC ACID, PLASMA: Lactic Acid, Venous: 1.1 mmol/L (ref 0.5–1.9)

## 2018-10-29 LAB — URINALYSIS, COMPLETE (UACMP) WITH MICROSCOPIC
Bacteria, UA: NONE SEEN
Bilirubin Urine: NEGATIVE
Glucose, UA: NEGATIVE mg/dL
Hgb urine dipstick: NEGATIVE
Ketones, ur: NEGATIVE mg/dL
Leukocytes,Ua: NEGATIVE
Nitrite: NEGATIVE
Protein, ur: NEGATIVE mg/dL
Specific Gravity, Urine: 1.017 (ref 1.005–1.030)
pH: 6 (ref 5.0–8.0)

## 2018-10-29 LAB — PHOSPHORUS: Phosphorus: 2.9 mg/dL (ref 2.5–4.6)

## 2018-10-29 LAB — RESPIRATORY PANEL BY PCR

## 2018-10-29 LAB — PROCALCITONIN: Procalcitonin: <0.1 ng/mL

## 2018-10-29 LAB — MRSA PCR SCREENING: MRSA by PCR: NEGATIVE

## 2018-10-29 LAB — MAGNESIUM: Magnesium: 2 mg/dL (ref 1.7–2.4)

## 2018-10-29 LAB — INFLUENZA PANEL BY PCR (TYPE A & B)
Influenza A By PCR: NEGATIVE
Influenza B By PCR: NEGATIVE

## 2018-10-29 LAB — GLUCOSE, CAPILLARY: Glucose-Capillary: 175 mg/dL — ABNORMAL HIGH (ref 70–99)

## 2018-10-29 LAB — TSH: TSH: 20.585 u[IU]/mL — ABNORMAL HIGH (ref 0.350–4.500)

## 2018-10-29 MED ORDER — DEXMEDETOMIDINE BOLUS VIA INFUSION: 1.0000 ug/kg | Freq: Once | INTRAVENOUS | Status: DC

## 2018-10-29 MED ORDER — DIPHENHYDRAMINE HCL 50 MG/ML IJ SOLN
50.0000 mg | Freq: Once | INTRAMUSCULAR | Status: AC
  Administered 2018-10-29: 50 mg via INTRAMUSCULAR
  Filled 2018-10-29: qty 1

## 2018-10-29 MED ORDER — DEXMEDETOMIDINE HCL IN NACL 400 MCG/100ML IV SOLN
0.0000 ug/h | INTRAVENOUS | Status: DC
  Administered 2018-10-29: 20:00:00 110.4 ug/h via INTRAVENOUS
  Administered 2018-10-29: 4 ug/h via INTRAVENOUS
  Filled 2018-10-29 (×2): qty 100

## 2018-10-29 MED ORDER — LEVOTHYROXINE SODIUM 137 MCG PO TABS
137.0000 ug | ORAL_TABLET | Freq: Every day | ORAL | Status: DC
  Administered 2018-10-30 – 2018-11-03 (×3): 137 ug via ORAL
  Filled 2018-10-29 (×6): qty 1

## 2018-10-29 MED ORDER — DIAZEPAM 5 MG PO TABS: 10.0000 mg | ORAL_TABLET | Freq: Four times a day (QID) | ORAL | Status: DC | PRN

## 2018-10-29 MED ORDER — ZIPRASIDONE MESYLATE 20 MG IM SOLR
10.0000 mg | Freq: Once | INTRAMUSCULAR | Status: AC
  Administered 2018-10-29: 10 mg via INTRAMUSCULAR
  Filled 2018-10-29 (×2): qty 20

## 2018-10-29 MED ORDER — ENOXAPARIN SODIUM 40 MG/0.4ML ~~LOC~~ SOLN: 40.0000 mg | SUBCUTANEOUS | Status: DC

## 2018-10-29 MED ORDER — FUROSEMIDE 20 MG PO TABS
20.0000 mg | ORAL_TABLET | Freq: Every day | ORAL | Status: DC
  Administered 2018-10-30: 09:00:00 20 mg via ORAL
  Filled 2018-10-29: qty 1

## 2018-10-29 MED ORDER — ORAL CARE MOUTH RINSE
15.0000 mL | Freq: Two times a day (BID) | OROMUCOSAL | Status: DC
  Administered 2018-10-29 – 2018-11-01 (×5): 15 mL via OROMUCOSAL

## 2018-10-29 MED ORDER — LACTATED RINGERS IV SOLN
INTRAVENOUS | Status: DC
  Administered 2018-10-29 – 2018-10-30 (×2): via INTRAVENOUS

## 2018-10-29 MED ORDER — PROMETHAZINE HCL 25 MG/ML IJ SOLN: 12.5000 mg | Freq: Four times a day (QID) | INTRAMUSCULAR | Status: DC | PRN

## 2018-10-29 MED ORDER — SODIUM CHLORIDE 0.9 % IV SOLN: 500.0000 mg | Freq: Once | INTRAVENOUS | Status: DC

## 2018-10-29 MED ORDER — BISACODYL 5 MG PO TBEC: 5.0000 mg | DELAYED_RELEASE_TABLET | Freq: Every day | ORAL | Status: DC | PRN

## 2018-10-29 MED ORDER — ZINC OXIDE 40 % EX OINT
TOPICAL_OINTMENT | Freq: Every day | CUTANEOUS | Status: DC
  Administered 2018-10-29 – 2018-10-30 (×2): via TOPICAL
  Filled 2018-10-29 (×2): qty 113

## 2018-10-29 MED ORDER — PREDNISONE 20 MG PO TABS: 40.0000 mg | ORAL_TABLET | Freq: Every day | ORAL | Status: DC

## 2018-10-29 MED ORDER — SENNOSIDES-DOCUSATE SODIUM 8.6-50 MG PO TABS: 1.0000 | ORAL_TABLET | Freq: Every evening | ORAL | Status: DC | PRN

## 2018-10-29 MED ORDER — ACETAMINOPHEN 325 MG PO TABS
650.0000 mg | ORAL_TABLET | Freq: Four times a day (QID) | ORAL | Status: DC | PRN
  Filled 2018-10-29 (×2): qty 2

## 2018-10-29 MED ORDER — CYCLOBENZAPRINE HCL 10 MG PO TABS: 5.0000 mg | ORAL_TABLET | Freq: Once | ORAL | Status: DC

## 2018-10-29 MED ORDER — METHYLPREDNISOLONE SODIUM SUCC 125 MG IJ SOLR
60.0000 mg | Freq: Two times a day (BID) | INTRAMUSCULAR | Status: DC
  Administered 2018-10-29: 60 mg via INTRAVENOUS
  Filled 2018-10-29: qty 2

## 2018-10-29 MED ORDER — OLANZAPINE 10 MG IM SOLR
5.0000 mg | Freq: Once | INTRAMUSCULAR | Status: AC
  Administered 2018-10-29: 5 mg via INTRAMUSCULAR
  Filled 2018-10-29: qty 10

## 2018-10-29 MED ORDER — HYDROCODONE-ACETAMINOPHEN 7.5-325 MG PO TABS: 1.0000 | ORAL_TABLET | Freq: Four times a day (QID) | ORAL | Status: DC | PRN

## 2018-10-29 MED ORDER — METOPROLOL SUCCINATE ER 50 MG PO TB24: 25.0000 mg | ORAL_TABLET | Freq: Every day | ORAL | Status: DC

## 2018-10-29 MED ORDER — LATANOPROST 0.005 % OP SOLN
1.0000 [drp] | Freq: Every day | OPHTHALMIC | Status: DC
  Filled 2018-10-29: qty 2.5

## 2018-10-29 MED ORDER — AMLODIPINE BESYLATE 10 MG PO TABS: 10.0000 mg | ORAL_TABLET | Freq: Every day | ORAL | Status: DC

## 2018-10-29 MED ORDER — OLANZAPINE 10 MG IM SOLR
5.0000 mg | Freq: Four times a day (QID) | INTRAMUSCULAR | Status: DC | PRN
  Administered 2018-10-30 – 2018-10-31 (×2): 5 mg via INTRAMUSCULAR
  Filled 2018-10-29 (×5): qty 10

## 2018-10-29 MED ORDER — METHYLPREDNISOLONE SODIUM SUCC 125 MG IJ SOLR
125.0000 mg | Freq: Once | INTRAMUSCULAR | Status: AC
  Administered 2018-10-29: 125 mg via INTRAVENOUS
  Filled 2018-10-29: qty 2

## 2018-10-29 MED ORDER — CEFAZOLIN SODIUM-DEXTROSE 1-4 GM/50ML-% IV SOLN
1.0000 g | Freq: Three times a day (TID) | INTRAVENOUS | Status: DC
  Administered 2018-10-29 – 2018-11-01 (×6): 1 g via INTRAVENOUS
  Filled 2018-10-29 (×14): qty 50

## 2018-10-29 MED ORDER — IPRATROPIUM-ALBUTEROL 0.5-2.5 (3) MG/3ML IN SOLN: 3.0000 mL | RESPIRATORY_TRACT | Status: DC

## 2018-10-29 MED ORDER — DIAZEPAM 2 MG PO TABS
10.0000 mg | ORAL_TABLET | Freq: Four times a day (QID) | ORAL | Status: DC | PRN
  Administered 2018-10-29 – 2018-10-30 (×2): 10 mg via ORAL
  Filled 2018-10-29 (×2): qty 5

## 2018-10-29 MED ORDER — DIPHENHYDRAMINE HCL 50 MG/ML IJ SOLN: 50.0000 mg | Freq: Once | INTRAMUSCULAR | Status: DC

## 2018-10-29 MED ORDER — ACETAMINOPHEN 650 MG RE SUPP: 650.0000 mg | Freq: Four times a day (QID) | RECTAL | Status: DC | PRN

## 2018-10-29 MED ORDER — IPRATROPIUM-ALBUTEROL 0.5-2.5 (3) MG/3ML IN SOLN
3.0000 mL | RESPIRATORY_TRACT | Status: DC | PRN
  Filled 2018-10-29: qty 3

## 2018-10-29 MED ORDER — DEXMEDETOMIDINE HCL IN NACL 200 MCG/50ML IV SOLN
0.0000 ug/kg/h | INTRAVENOUS | Status: DC
  Administered 2018-10-29: 0.4 ug/kg/h via INTRAVENOUS
  Filled 2018-10-29 (×2): qty 50

## 2018-10-29 MED ORDER — IPRATROPIUM-ALBUTEROL 20-100 MCG/ACT IN AERS
1.0000 | INHALATION_SPRAY | RESPIRATORY_TRACT | Status: DC
  Filled 2018-10-29: qty 4

## 2018-10-29 MED ORDER — DIPHENHYDRAMINE HCL 25 MG PO CAPS: 50.0000 mg | ORAL_CAPSULE | Freq: Once | ORAL | Status: AC

## 2018-10-29 NOTE — ED Notes (Signed)
ED TO INPATIENT HANDOFF REPORT  ED Nurse Name and Phone #: Jae Dire [phone]  S Name/Age/Gender Eric [name] 73 y.o. male Room/Bed: ED06A/ED06A  Code Status   Code Status: Not on file  Home/SNF/Other Skilled nursing facility Patient oriented to: self, place and time Is this baseline? Yes   Triage Complete: Triage complete  Chief Complaint SOB  Triage Note Patient arrives from [facility] with complaints of urinary retention and shortness of breath- EMS reports saturations of 83% on RA. Patient arrives with increased work of breathing and agitation/confusion. BP 202/104 en route, patient refused all medications and IV insertion via EMS   Allergies Allergies  Allergen Reactions  . Demerol [Meperidine] Other (See Comments)    Will counteract with a drug he is taking causing fatal reaction with nardil  . Epinephrine Other (See Comments)    Nardil reaction-With epi?  . Nardil [Phenelzine] Other (See Comments)  . Morphine And Related Other (See Comments)    Reacts with nardil  . Ondansetron     Made patient want to climb the walls  . Shellfish Allergy Hives  . Soy Allergy Hives    Level of Care/Admitting Diagnosis ED Disposition    ED Disposition Condition Comment   Admit  Hospital Area: [facility] [[id number]]  Level of Care: Telemetry [5]  Diagnosis: Suspected Covid-19 Virus Infection [[id number]]  Admitting Physician: Barbaraann Rondo [[id number]]  Attending Physician: Barbaraann Rondo [[id number]]  Estimated length of stay: past midnight tomorrow  Certification:: I certify this patient will need inpatient services for at least 2 midnights  PT Class (Do Not Modify): Inpatient [101]  PT Acc Code (Do Not Modify): Private [1]       B Medical/Surgery History Past Medical History:  Diagnosis Date  . Anxiety and depression   . Cataract   . Gallstones   . Glaucoma   . Hypertension   . Hypoglycemia   . Inguinal hernia   . Sleep apnea   . Thyroid  disease    Past Surgical History:  Procedure Laterality Date  . FINGER AMPUTATION Right      A IV Location/Drains/Wounds Patient Lines/Drains/Airways Status   Active Line/Drains/Airways    Name:   Placement date:   Placement time:   Site:   Days:   Peripheral IV 10/28/18 Left Antecubital   10/28/18    2300    Antecubital   1          Intake/Output Last 24 hours No intake or output data in the 24 hours ending 10/29/18 0101  Labs/Imaging Results for orders placed or performed during the hospital encounter of 10/28/18 (from the past 48 hour(s))  CBC with Differential/Platelet     Status: Abnormal   Collection Time: 10/28/18 10:50 PM  Result Value Ref Range   WBC 14.7 (H) 4.0 - 10.5 K/uL   RBC 4.25 4.22 - 5.81 MIL/uL   Hemoglobin 13.0 13.0 - 17.0 g/dL   HCT 78.4 69.6 - 29.5 %   MCV 93.4 80.0 - 100.0 fL   MCH 30.6 26.0 - 34.0 pg   MCHC 32.7 30.0 - 36.0 g/dL   RDW 28.4 13.2 - 44.0 %   Platelets 212 150 - 400 K/uL   nRBC 0.0 0.0 - 0.2 %   Neutrophils Relative % 85 %   Neutro Abs 12.5 (H) 1.7 - 7.7 K/uL   Lymphocytes Relative 5 %   Lymphs Abs 0.8 0.7 - 4.0 K/uL   Monocytes Relative 8 %   Monocytes Absolute  1.1 (H) 0.1 - 1.0 K/uL   Eosinophils Relative 1 %   Eosinophils Absolute 0.2 0.0 - 0.5 K/uL   Basophils Relative 0 %   Basophils Absolute 0.0 0.0 - 0.1 K/uL   Immature Granulocytes 1 %   Abs Immature Granulocytes 0.12 (H) 0.00 - 0.07 K/uL    Comment: Performed at [facility], [address] [place], [place] [address]  Comprehensive metabolic panel     Status: Abnormal   Collection Time: 10/28/18 10:50 PM  Result Value Ref Range   Sodium 135 135 - 145 mmol/L   Potassium 4.3 3.5 - 5.1 mmol/L   Chloride 99 98 - 111 mmol/L   CO2 29 22 - 32 mmol/L   Glucose, Bld 153 (H) 70 - 99 mg/dL   BUN 15 8 - 23 mg/dL   Creatinine, Ser 2.54 0.61 - 1.24 mg/dL   Calcium 8.2 (L) 8.9 - 10.3 mg/dL   Total Protein 7.1 6.5 - 8.1 g/dL   Albumin 3.3 (L) 3.5 - 5.0 g/dL   AST  16 15 - 41 U/L   ALT 13 0 - 44 U/L   Alkaline Phosphatase 69 38 - 126 U/L   Total Bilirubin 1.1 0.3 - 1.2 mg/dL   GFR calc non Af Amer >60 >60 mL/min   GFR calc Af Amer >60 >60 mL/min   Anion gap 7 5 - 15    Comment: Performed at [facility], [address] [place], [place] [address]  Brain natriuretic peptide     Status: None   Collection Time: 10/28/18 10:50 PM  Result Value Ref Range   B Natriuretic Peptide 16.0 0.0 - 100.0 pg/mL    Comment: Performed at [facility], [address] [place], [place] [address]  Troponin I - ONCE - STAT     Status: None   Collection Time: 10/28/18 10:50 PM  Result Value Ref Range   Troponin I <0.03 <0.03 ng/mL    Comment: Performed at [facility], [address] [place], [place] [address]  Influenza panel by PCR (type A & B)     Status: None   Collection Time: 10/28/18 11:51 PM  Result Value Ref Range   Influenza A By PCR NEGATIVE NEGATIVE   Influenza B By PCR NEGATIVE NEGATIVE    Comment: (NOTE) The Xpert Xpress Flu assay is intended as an aid in the diagnosis of  influenza and should not be used as a sole basis for treatment.  This  assay is FDA approved for nasopharyngeal swab specimens only. Nasal  washings and aspirates are unacceptable for Xpert Xpress Flu testing. Performed at [facility], [address] [place], [place] [address]   Urinalysis, Complete w Microscopic     Status: Abnormal   Collection Time: 10/28/18 11:51 PM  Result Value Ref Range   Color, Urine YELLOW (A) YELLOW   APPearance CLEAR (A) CLEAR   Specific Gravity, Urine 1.017 1.005 - 1.030   pH 6.0 5.0 - 8.0   Glucose, UA NEGATIVE NEGATIVE mg/dL   Hgb urine dipstick NEGATIVE NEGATIVE   Bilirubin Urine NEGATIVE NEGATIVE   Ketones, ur NEGATIVE NEGATIVE mg/dL   Protein, ur NEGATIVE NEGATIVE mg/dL   Nitrite NEGATIVE NEGATIVE   Leukocytes,Ua NEGATIVE NEGATIVE   RBC / HPF 0-5 0 - 5 RBC/hpf   WBC, UA 0-5 0 - 5 WBC/hpf    Bacteria, UA NONE SEEN NONE SEEN   Squamous Epithelial / LPF 0-5 0 - 5   Mucus  PRESENT    Hyaline Casts, UA PRESENT     Comment: Performed at [facility], [address] [place], [place] [id number]   Dg Chest Portable 1 View  Result Date: 10/28/2018 CLINICAL DATA:  Initial evaluation for acute respiratory distress, urinary retention. EXAM: PORTABLE CHEST 1 VIEW COMPARISON:  Prior radiograph from 01/19/2010. FINDINGS: Mild cardiomegaly, stable. Mediastinal silhouette normal. Tortuosity the intrathoracic aorta noted. Lungs mildly hypoinflated. Scattered diffuse peribronchial thickening, which could reflect sequelae of acute bronchiolitis. Superimposed scattered bibasilar atelectatic changes. No consolidative opacity. No pulmonary edema or definite pleural effusion. No pneumothorax. Question of an approximate 1 cm nodular density at the right lung apex, similar to previous. No acute osseous abnormality. Degenerative changes about the shoulders. IMPRESSION: 1. Mild scattered diffuse peribronchial thickening, which could reflect sequelae of acute bronchiolitis and/or atypical pneumonitis given provided history of shortness of breath. No consolidative opacity identified. 2. Superimposed scattered bibasilar atelectatic changes. 3. 1 cm nodular density overlying the right lung apex, similar as compared to prior radiograph from 2011. Again, further assessment with dedicated CT could be performed for complete evaluation. Electronically Signed   By: Rise MuBenjamin  McClintock M.D.   On: 10/28/2018 23:29    Pending Labs Unresulted Labs (From admission, onward)    Start     Ordered   10/29/18 0028  Magnesium  Add-on,   AD    Question:  Specimen collection method  Answer:  Unit=Unit collect   10/29/18 0027   10/29/18 0028  Phosphorus  Add-on,   AD    Question:  Specimen collection method  Answer:  Unit=Unit collect   10/29/18 0027   10/29/18 0028  Calcium, ionized  Once,   STAT    Question:  Specimen  collection method  Answer:  Unit=Unit collect   10/29/18 0027   10/29/18 0028  Prealbumin  Add-on,   AD    Question:  Specimen collection method  Answer:  Unit=Unit collect   10/29/18 0027   10/29/18 0028  Procalcitonin - Baseline  Add-on,   AD    Question:  Specimen collection method  Answer:  Unit=Unit collect   10/29/18 0027   10/29/18 0028  Lactic acid, plasma  Once,   STAT    Question:  Specimen collection method  Answer:  Unit=Unit collect   10/29/18 0027   10/29/18 0027  TSH  Add-on,   AD    Question:  Specimen collection method  Answer:  Unit=Unit collect   10/29/18 0027   10/29/18 0006  Respiratory Panel by PCR  (Respiratory virus panel with precautions)  Once,   STAT     10/29/18 0005   10/28/18 2347  Novel Coronavirus, NAA (hospital order; send-out to ref lab)  (Novel Coronavirus, NAA [facility](Hospital Order; send-out to ref lab) with precautions panel)  Once,   STAT    Question Answer Comment  Current symptoms Fever and Cough   Excluded other viral illnesses Yes   Exposure Risk Traveled to high risk areas in the last 14 days   Patient immune status Normal      10/28/18 2347   10/28/18 2343  Blood Culture (routine x 2)  BLOOD CULTURE X 2,   STAT     10/28/18 2343   Signed and Held  Basic metabolic panel  Tomorrow morning,   R     Signed and Held          Vitals/Pain Today's Vitals   10/28/18 2330 10/29/18 0000 10/29/18 0030 10/29/18 0100  BP: Marland Kitchen(!)  149/97 (!) 177/95 (!) 148/92 140/84  Pulse: 97 98 94 91  Resp:      Temp:      TempSrc:      SpO2: 98% 93% 96% 97%  Weight:      Height:      PainSc:        Isolation Precautions Droplet and Contact precautions  Medications Medications  vancomycin (VANCOCIN) IVPB 1000 mg/200 mL premix (has no administration in time range)  methylPREDNISolone sodium succinate (SOLU-MEDROL) 125 mg/2 mL injection 125 mg (has no administration in time range)  methylPREDNISolone sodium succinate (SOLU-MEDROL) 125 mg/2 mL injection 60 mg  (has no administration in time range)    Followed by  predniSONE (DELTASONE) tablet 40 mg (has no administration in time range)  azithromycin (ZITHROMAX) 500 mg in sodium chloride 0.9 % 250 mL IVPB (has no administration in time range)  Ipratropium-Albuterol (COMBIVENT) respimat 1 puff (has no administration in time range)  cyclobenzaprine (FLEXERIL) tablet 5 mg (has no administration in time range)  liver oil-zinc oxide (DESITIN) 40 % ointment (has no administration in time range)  furosemide (LASIX) tablet 20 mg (has no administration in time range)  ceFAZolin (ANCEF) IVPB 1 g/50 mL premix (has no administration in time range)  diazepam (VALIUM) 5 MG tablet (  Given 10/28/18 2300)  furosemide (LASIX) injection 60 mg (60 mg Intravenous Given 10/28/18 2350)  ceFEPIme (MAXIPIME) 2 g in sodium chloride 0.9 % 100 mL IVPB (2 g Intravenous New Bag/Given 10/29/18 0018)  albuterol (PROVENTIL HFA;VENTOLIN HFA) 108 (90 Base) MCG/ACT inhaler 2 puff (2 puffs Inhalation Given 10/29/18 0100)    Mobility non-ambulatory High fall risk   Focused Assessments Pulmonary Assessment Handoff:  Lung sounds:   O2 Device: Nasal Cannula O2 Flow Rate (L/min): 3 L/min      R Recommendations: See Admitting Provider Note  Report given to:   Additional Notes:

## 2018-10-29 NOTE — ED Notes (Signed)
Patient in Ultrasound.

## 2018-10-29 NOTE — H&P (Signed)
[facility] - [place] at [facility]   PATIENT NAME: Eric [name]    MR#:  [id number]  DATE OF BIRTH:  [birth year]  DATE OF ADMISSION:  10/28/2018  PRIMARY CARE PHYSICIAN: Dortha Kern, MD   REQUESTING/REFERRING PHYSICIAN: Willy Eddy, MD  CHIEF COMPLAINT:   Chief Complaint  Patient presents with  . Shortness of Breath    HISTORY OF PRESENT ILLNESS:  Eric [name]  is a 73 y.o. male with a known history of chronic systolic CHF (EF 40-98% as of 11/91/4782 Echo; EF improved to 50% as of 08/03/2018 Echo), COPD, HTN, DJD/OA p/w SOB. He resides at [facility]. A review of his records demonstrates a litany of ED visits, mostly to [place]. ED RN tells me that medication administration record from [facility] demonstrates that has been refusing most, if not all, of his medications/treatments for the 1-2d leading up present hospitalization. He is oriented, but appears to have underlying behavioral issues, and has been yelling at and behaving abusively towards nursing and staff. He is not allowing for treatment to be administered, and is interfering with lines and devices. He yelled at me multiple times while I was in the room. He accused me of trying to "give me (him) 'The Virus'". He then accused the hospital of, "trying to murder me (him)". He then yelled, "I am not an animal!" though nobody suggested anything of the sort.  He says his scrotum has been swollen for a week, and is painful. He appears to have scrotal cellulitis. I am told he wears a diaper at [facility].  PAST MEDICAL HISTORY:   Past Medical History:  Diagnosis Date  . Anxiety and depression   . Cataract   . Gallstones   . Glaucoma   . Hypertension   . Hypoglycemia   . Inguinal hernia   . Sleep apnea   . Thyroid disease     PAST SURGICAL HISTORY:   Past Surgical History:  Procedure Laterality Date  . FINGER AMPUTATION Right     SOCIAL HISTORY:   Social History   Tobacco Use  . Smoking status:  Former Smoker    Last attempt to quit: 10/18/2010    Years since quitting: 8.0  . Smokeless tobacco: Never Used  Substance Use Topics  . Alcohol use: No    FAMILY HISTORY:   Family History  Problem Relation Age of Onset  . Alzheimer's disease Mother     DRUG ALLERGIES:   Allergies  Allergen Reactions  . Demerol [Meperidine] Other (See Comments)    Will counteract with a drug he is taking causing fatal reaction with nardil  . Epinephrine Other (See Comments)    Nardil reaction-With epi?  . Nardil [Phenelzine] Other (See Comments)  . Morphine And Related Other (See Comments)    Reacts with nardil  . Ondansetron     Made patient want to climb the walls  . Shellfish Allergy Hives  . Soy Allergy Hives    REVIEW OF SYSTEMS:   Review of Systems  Unable to perform ROS: Psychiatric disorder  Respiratory: Positive for shortness of breath.     MEDICATIONS AT HOME:   Prior to Admission medications   Medication Sig Start Date End Date Taking? Authorizing Provider  albuterol (ACCUNEB) 0.63 MG/3ML nebulizer solution Take 1 ampule by nebulization every 6 (six) hours as needed for shortness of breath.   Yes [provider]  furosemide (LASIX) 80 MG tablet Take 80 mg by mouth 2 (two) times  daily.   Yes [provider]  levothyroxine (SYNTHROID, LEVOTHROID) 137 MCG tablet Take 137 mcg by mouth daily before breakfast.   Yes [provider]  Melatonin 3 MG TABS Take 1 tablet by mouth daily.   Yes [provider]  metoprolol succinate (TOPROL-XL) 100 MG 24 hr tablet Take 100 mg by mouth daily. Take with or immediately following a meal.   Yes [provider]  senna-docusate (SENEXON-S) 8.6-50 MG tablet Take 2 tablets by mouth 2 (two) times daily.   Yes [provider]  Vitamin D, Ergocalciferol, (DRISDOL) 1.25 MG (50000 UT) CAPS capsule Take 50,000 Units by mouth every 7 (seven) days.   Yes [provider]  amLODipine (NORVASC)  10 MG tablet Take 10 mg by mouth at bedtime.    [provider]  bimatoprost (LUMIGAN) 0.01 % SOLN Place 1 drop into both eyes at bedtime.    [provider]  cyanocobalamin 500 MCG tablet Take 500 mcg by mouth 3 (three) times a week.    [provider]  diazepam (VALIUM) 10 MG tablet Take 10 mg by mouth every 6 (six) hours as needed for anxiety.    [provider]  dimenhyDRINATE (DRAMAMINE) 50 MG tablet Take 50-100 mg by mouth every 8 (eight) hours as needed for nausea.    [provider]  HYDROcodone-acetaminophen (NORCO) 7.5-325 MG per tablet Take 1 tablet by mouth every 6 (six) hours as needed for moderate pain or severe pain. For pain    [provider]      VITAL SIGNS:  Blood pressure (!) 159/94, pulse (!) 106, temperature 98.8 F (37.1 C), temperature source Oral, resp. rate 20, height 5\' 8"  (1.727 m), weight 110.5 kg, SpO2 96 %.  PHYSICAL EXAMINATION:  Physical Exam Constitutional:      General: He is not in acute distress.    Appearance: He is ill-appearing. He is not toxic-appearing or diaphoretic.  HENT:     Head: Atraumatic.  Eyes:     General: No scleral icterus.    Extraocular Movements: Extraocular movements intact.     Conjunctiva/sclera: Conjunctivae normal.  Cardiovascular:     Rate and Rhythm: Regular rhythm. Tachycardia present.     Heart sounds: Normal heart sounds. No murmur. No friction rub. No gallop.   Pulmonary:     Effort: No respiratory distress.     Breath sounds: No stridor. No wheezing, rhonchi or rales.  Abdominal:     General: Bowel sounds are normal. There is no distension.     Palpations: Abdomen is soft.     Tenderness: There is no abdominal tenderness. There is no guarding or rebound.  Musculoskeletal: Normal range of motion.        General: Swelling present. No tenderness.     Right lower leg: No edema.     Left lower leg: No edema.  Skin:    General: Skin is warm and dry.      Findings: Erythema present.  Neurological:     Mental Status: He is alert. Mental status is at baseline.  Psychiatric:        Attention and Perception: Attention normal. He is attentive.        Mood and Affect: Mood is anxious. Mood is not depressed. Affect is angry and inappropriate. Affect is not blunt, flat or tearful.        Speech: He is communicative. Speech is rapid and pressured and tangential. Speech is not delayed or slurred.  Behavior: Behavior is uncooperative, agitated, aggressive and combative. Behavior is not slowed, withdrawn or hyperactive.        Thought Content: Thought content is paranoid and delusional. Thought content does not include homicidal or suicidal ideation.        Cognition and Memory: Memory is not impaired.        Judgment: Judgment is impulsive and inappropriate.    Poor air movement, (-) rhonchi/rales/wheezing. Tachycardic, regular rhythm. Protuberant abdomen, soft, NT/ND. B/L LE chronic venous stasis skin changes, trace/1+ edema. Scrotal erythema/edema/TTP. LABORATORY PANEL:   CBC Recent Labs  Lab 10/28/18 2250  WBC 14.7*  HGB 13.0  HCT 39.7  PLT 212   ------------------------------------------------------------------------------------------------------------------  Chemistries  Recent Labs  Lab 10/28/18 2250  NA 135  K 4.3  CL 99  CO2 29  GLUCOSE 153*  BUN 15  CREATININE 1.00  CALCIUM 8.2*  MG 2.0  AST 16  ALT 13  ALKPHOS 69  BILITOT 1.1   ------------------------------------------------------------------------------------------------------------------  Cardiac Enzymes Recent Labs  Lab 10/28/18 2250  TROPONINI <0.03   ------------------------------------------------------------------------------------------------------------------  RADIOLOGY:  US Scrotum  Result Date: 10/29/2018 CLINICAL DATA:  Initial evaluation for scrotal swelling, cellulitis or abscess. EXAM: SCROTAL ULTRASOUND DOPPLER ULTRASOUND OF THE TESTICLES  TECHNIQUE: Complete ultrasound examination of the testicles, epididymis, and other scrotal structures was performed. Color and spectral Doppler ultrasound were also utilized to evaluate blood flow to the testicles. COMPARISON:  None. FINDINGS: Right testicle Measurements: 3.9 x 2.9 x 2.9 cm. No mass or microlithiasis visualized. Left testicle Measurements: 3.7 x 2.7 x 2.6 cm. No mass or microlithiasis visualized. Right epididymis:  Normal in size and appearance. Left epididymis:  Normal in size and appearance. Hydrocele:  Small bilateral hydroceles. Varicocele:  None visualized. Pulsed Doppler interrogation of both testes demonstrates normal low resistance arterial and venous waveforms bilaterally. Extensive edema seen throughout the visualized scrotal wall, which could be related overall volume status and/or infection/cellulitis. No discrete abscess or drainable fluid collection. IMPRESSION: 1. Extensive edema throughout the scrotal wall, suspicious for infection/cellulitis given provided history. No discrete abscess or drainable fluid collection identified. 2. Small bilateral hydroceles, likely reactive. 3. Normal sonographic appearance of the testes and epididymi without evidence for concomitant epididymo-orchitis. No evidence for torsion. Electronically Signed   By: Rise Mu M.D.   On: 10/29/2018 02:21   US Pelvic Doppler (torsion R/o Or Mass Arterial Flow)  Result Date: 10/29/2018 CLINICAL DATA:  Initial evaluation for scrotal swelling, cellulitis or abscess. EXAM: SCROTAL ULTRASOUND DOPPLER ULTRASOUND OF THE TESTICLES TECHNIQUE: Complete ultrasound examination of the testicles, epididymis, and other scrotal structures was performed. Color and spectral Doppler ultrasound were also utilized to evaluate blood flow to the testicles. COMPARISON:  None. FINDINGS: Right testicle Measurements: 3.9 x 2.9 x 2.9 cm. No mass or microlithiasis visualized. Left testicle Measurements: 3.7 x 2.7 x 2.6 cm. No  mass or microlithiasis visualized. Right epididymis:  Normal in size and appearance. Left epididymis:  Normal in size and appearance. Hydrocele:  Small bilateral hydroceles. Varicocele:  None visualized. Pulsed Doppler interrogation of both testes demonstrates normal low resistance arterial and venous waveforms bilaterally. Extensive edema seen throughout the visualized scrotal wall, which could be related overall volume status and/or infection/cellulitis. No discrete abscess or drainable fluid collection. IMPRESSION: 1. Extensive edema throughout the scrotal wall, suspicious for infection/cellulitis given provided history. No discrete abscess or drainable fluid collection identified. 2. Small bilateral hydroceles, likely reactive. 3. Normal sonographic appearance of the testes and epididymi without evidence for concomitant epididymo-orchitis.  No evidence for torsion. Electronically Signed   By: Rise MuBenjamin  McClintock M.D.   On: 10/29/2018 02:21   Dg Chest Portable 1 View  Result Date: 10/28/2018 CLINICAL DATA:  Initial evaluation for acute respiratory distress, urinary retention. EXAM: PORTABLE CHEST 1 VIEW COMPARISON:  Prior radiograph from 01/19/2010. FINDINGS: Mild cardiomegaly, stable. Mediastinal silhouette normal. Tortuosity the intrathoracic aorta noted. Lungs mildly hypoinflated. Scattered diffuse peribronchial thickening, which could reflect sequelae of acute bronchiolitis. Superimposed scattered bibasilar atelectatic changes. No consolidative opacity. No pulmonary edema or definite pleural effusion. No pneumothorax. Question of an approximate 1 cm nodular density at the right lung apex, similar to previous. No acute osseous abnormality. Degenerative changes about the shoulders. IMPRESSION: 1. Mild scattered diffuse peribronchial thickening, which could reflect sequelae of acute bronchiolitis and/or atypical pneumonitis given provided history of shortness of breath. No consolidative opacity identified.  2. Superimposed scattered bibasilar atelectatic changes. 3. 1 cm nodular density overlying the right lung apex, similar as compared to prior radiograph from 2011. Again, further assessment with dedicated CT could be performed for complete evaluation. Electronically Signed   By: Rise MuBenjamin  McClintock M.D.   On: 10/28/2018 23:29   IMPRESSION AND PLAN:   Assessment: 86M w/ PMHx chronic systolic CHF (EF 19-14%30-35% as of 78/29/562101/02/2019 Echo; EF improved to 50% as of 08/03/2018 Echo), COPD, HTN, DJD/OA p/w SOB, acute viral bronchitis +/- COVID-19. Hyperglycemia, hypocalcemia, hypoalbuminemia.  Recommendations: -I recommend: ABx (Keflex) for scrotal cellulitis. U/S to evaluate for fluid collection/abscess. -I recommend: Nebs, steroids, Azithromycin, O2, pulse ox, incentive spirometry, pulmonary toileting for acute bronchitis. -I recommend: COVID-19 testing, contact/droplet precautions. -Ionized calcium. -Prealbumin.  Barriers: -Pt is not cooperating with any of the facets of the recommended treatment. Lines pulled, testing (Cx) and medication (ABx) refused.  Possible recourse: -Contact family to attempt to convince pt to behave like an adult. -Psychiatry consult. -IVC.  -We will not succeed in helping the patient unless he allows us to help him. -When asked if he wishes to be intubated in the case of respiratory failure, he says, "I'll think about it." -FEN/GI: Cardiac diet. -DVT PPx: Lovenox. -Code status: Full code. -Disposition: Admission, > 2 midnights.   Addendum: Security called to pt's room [phone]@~0452AM.   All the records are reviewed and case discussed with ED provider. Management plans discussed with the patient, family and they are in agreement.  CODE STATUS: Full code.  TOTAL TIME TAKING CARE OF THIS PATIENT: 75 minutes.    Barbaraann RondoPrasanna Alyzabeth Pontillo M.D on 10/29/2018 at 4:31 AM  Between 7am to 6pm - Pager - [phone]  After 6pm go to [email or web address] - Social research officer, governmentpassword EPAS [facility]  [facility]  Office  [phone]  CC: Primary care physician; Dortha KernBliss, Laura K, MD   Note: This dictation was prepared with Dragon dictation along with smaller phrase technology. Any transcriptional errors that result from this process are unintentional.

## 2018-10-29 NOTE — Consult Note (Signed)
[facility]   Reason for Consult: agitation, psychosis. Referring Physician: Barbaraann RondoSridharan, Prasanna, MD Patient Identification: Eric [name] MRN:  [id number] Principal Diagnosis: <principal problem not specified> Diagnosis:  Active Problems:   Acute hypoxemic respiratory failure (HCC)   Suspected Covid-19 Virus Infection   Total Time spent with patient: 20 minutes  Subjective:   Eric [name] is a 73 y.o. male patient with a medical history of COPD, HTN, thyroid disease, and a psych history of depression and anxiety, who was admitted last night to general medicine service from [facility] facility with acute respiratory failure and agitation.   On the medical floor, patient was agitated, aggressive, verbally abusive to staff, refusing care. He received PRN medications for his agitation: one dose of IM Zyprexa 5mg  and IM Benadryl 50mg , then -  one dose of Geodon 10mg  without significant calming effect. Patient was transferred to ICU for Precedex drip.  Workup from ER significant for initial hypoxia in the low 80s, elevated WNC and Neutroph, significantly elevated TSH (20.585). Blood cultures and COVID-19 testing results are pending. On ED exam scrotal cellulitis was found.  I attempted to see the patient in ICU this morning. Patient appears to be sedated and I could not wake him up. Per RN, Precedex drip was not started as the patient calmed down and fell asleep without that.  Attempted to reach patient`s son Eric [name] [phone] - no phone answer., unable to leave a message.  Will attempt to interview the patient later today when he is awake.      Past Psychiatric History: Prior psych Dx per chart: depression, anxiety. Per chart review - no past psych admissions or psych-related ED-visits. H/o past suicidal attempts or violence - unknown.     Past Medical History:  Past Medical History:  Diagnosis Date  . Anxiety and depression   . Cataract   .  Gallstones   . Glaucoma   . Hypertension   . Hypoglycemia   . Inguinal hernia   . Sleep apnea   . Thyroid disease     Past Surgical History:  Procedure Laterality Date  . FINGER AMPUTATION Right    Family History:  Family History  Problem Relation Age of Onset  . Alzheimer's disease Mother     Family Psychiatric  History: unknown   Social History:  Patient lives in [facility]. Divorced. Has 3 children.    Social History   Substance and Sexual Activity  Alcohol Use No     Social History   Substance and Sexual Activity  Drug Use No    Social History   Socioeconomic History  . Marital status: Divorced    Spouse name: Not on file  . Number of children: 3  . Years of education: Not on file  . Highest education level: Not on file  Occupational History  . Not on file  Social Needs  . Financial resource strain: Not on file  . Food insecurity:    Worry: Not on file    Inability: Not on file  . Transportation needs:    Medical: Not on file    Non-medical: Not on file  Tobacco Use  . Smoking status: Former Smoker    Last attempt to quit: 10/18/2010    Years since quitting: 8.0  . Smokeless tobacco: Never Used  Substance and Sexual Activity  . Alcohol use: No  . Drug use: No  . Sexual activity: Not on file  Lifestyle  .  Physical activity:    Days per week: Not on file    Minutes per session: Not on file  . Stress: Not on file  Relationships  . Social connections:    Talks on phone: Not on file    Gets together: Not on file    Attends religious service: Not on file    Active member of club or organization: Not on file    Attends meetings of clubs or organizations: Not on file    Relationship status: Not on file  Other Topics Concern  . Not on file  Social History Narrative  . Not on file   Additional Social History:    Allergies:   Allergies  Allergen Reactions  . Demerol [Meperidine] Other (See Comments)    Will  counteract with a drug he is taking causing fatal reaction with nardil  . Epinephrine Other (See Comments)    Nardil reaction-With epi?  . Nardil [Phenelzine] Other (See Comments)  . Morphine And Related Other (See Comments)    Reacts with nardil  . Ondansetron     Made patient want to climb the walls  . Shellfish Allergy Hives  . Soy Allergy Hives    Labs:  Results for orders placed or performed during the hospital encounter of 10/28/18 (from the past 48 hour(s))  CBC with Differential/Platelet     Status: Abnormal   Collection Time: 10/28/18 10:50 PM  Result Value Ref Range   WBC 14.7 (H) 4.0 - 10.5 K/uL   RBC 4.25 4.22 - 5.81 MIL/uL   Hemoglobin 13.0 13.0 - 17.0 g/dL   HCT 16.1 09.6 - 04.5 %   MCV 93.4 80.0 - 100.0 fL   MCH 30.6 26.0 - 34.0 pg   MCHC 32.7 30.0 - 36.0 g/dL   RDW 40.9 81.1 - 91.4 %   Platelets 212 150 - 400 K/uL   nRBC 0.0 0.0 - 0.2 %   Neutrophils Relative % 85 %   Neutro Abs 12.5 (H) 1.7 - 7.7 K/uL   Lymphocytes Relative 5 %   Lymphs Abs 0.8 0.7 - 4.0 K/uL   Monocytes Relative 8 %   Monocytes Absolute 1.1 (H) 0.1 - 1.0 K/uL   Eosinophils Relative 1 %   Eosinophils Absolute 0.2 0.0 - 0.5 K/uL   Basophils Relative 0 %   Basophils Absolute 0.0 0.0 - 0.1 K/uL   Immature Granulocytes 1 %   Abs Immature Granulocytes 0.12 (H) 0.00 - 0.07 K/uL    Comment: Performed at [facility], [address] [place], [place] [address]  Comprehensive metabolic panel     Status: Abnormal   Collection Time: 10/28/18 10:50 PM  Result Value Ref Range   Sodium 135 135 - 145 mmol/L   Potassium 4.3 3.5 - 5.1 mmol/L   Chloride 99 98 - 111 mmol/L   CO2 29 22 - 32 mmol/L   Glucose, Bld 153 (H) 70 - 99 mg/dL   BUN 15 8 - 23 mg/dL   Creatinine, Ser 6.21 0.61 - 1.24 mg/dL   Calcium 8.2 (L) 8.9 - 10.3 mg/dL   Total Protein 7.1 6.5 - 8.1 g/dL   Albumin 3.3 (L) 3.5 - 5.0 g/dL   AST 16 15 - 41 U/L   ALT 13 0 - 44 U/L   Alkaline Phosphatase 69 38 - 126 U/L   Total  Bilirubin 1.1 0.3 - 1.2 mg/dL   GFR calc non Af Amer >60 >60 mL/min   GFR calc Af Amer >60 >  60 mL/min   Anion gap 7 5 - 15    Comment: Performed at [facility], [address] [place], [place] [address]  Brain natriuretic peptide     Status: None   Collection Time: 10/28/18 10:50 PM  Result Value Ref Range   B Natriuretic Peptide 16.0 0.0 - 100.0 pg/mL    Comment: Performed at [facility], [address] [place], [place] [address]  Troponin I - ONCE - STAT     Status: None   Collection Time: 10/28/18 10:50 PM  Result Value Ref Range   Troponin I <0.03 <0.03 ng/mL    Comment: Performed at [facility], [address] [place], [place] [address]  TSH     Status: Abnormal   Collection Time: 10/28/18 10:50 PM  Result Value Ref Range   TSH 20.585 (H) 0.350 - 4.500 uIU/mL    Comment: Performed by a 3rd Generation assay with a functional sensitivity of <=0.01 uIU/mL. Performed at [facility], [address] [place], [place] [address]   Magnesium     Status: None   Collection Time: 10/28/18 10:50 PM  Result Value Ref Range   Magnesium 2.0 1.7 - 2.4 mg/dL    Comment: Performed at [facility], [address] [place], [place] [address]  Phosphorus     Status: None   Collection Time: 10/28/18 10:50 PM  Result Value Ref Range   Phosphorus 2.9 2.5 - 4.6 mg/dL    Comment: Performed at [facility], [address] [place], [place] [address]  Procalcitonin - Baseline     Status: None   Collection Time: 10/28/18 10:50 PM  Result Value Ref Range   Procalcitonin <0.10 ng/mL    Comment:        Interpretation: PCT (Procalcitonin) <= 0.5 ng/mL: Systemic infection (sepsis) is not likely. Local bacterial infection is possible. (NOTE)       Sepsis PCT Algorithm           Lower Respiratory Tract                                      Infection PCT Algorithm    ----------------------------     ----------------------------         PCT  < 0.25 ng/mL                PCT < 0.10 ng/mL         Strongly encourage             Strongly discourage   discontinuation of antibiotics    initiation of antibiotics    ----------------------------     -----------------------------       PCT 0.25 - 0.50 ng/mL            PCT 0.10 - 0.25 ng/mL               OR       >80% decrease in PCT            Discourage initiation of                                            antibiotics      Encourage discontinuation           of  antibiotics    ----------------------------     -----------------------------         PCT >= 0.50 ng/mL              PCT 0.26 - 0.50 ng/mL               AND        <80% decrease in PCT             Encourage initiation of                                             antibiotics       Encourage continuation           of antibiotics    ----------------------------     -----------------------------        PCT >= 0.50 ng/mL                  PCT > 0.50 ng/mL               AND         increase in PCT                  Strongly encourage                                      initiation of antibiotics    Strongly encourage escalation           of antibiotics                                     -----------------------------                                           PCT <= 0.25 ng/mL                                                 OR                                        > 80% decrease in PCT                                     Discontinue / Do not initiate                                             antibiotics Performed at [facility], [address] [place], [place] [address]   Lactic acid, plasma     Status: None   Collection Time: 10/28/18 10:50 PM  Result Value Ref Range   Lactic Acid, Venous 1.1 0.5 - 1.9 mmol/L  Comment: Performed at [facility], [address] [place], [place] [address]  Influenza panel by PCR (type A & B)     Status: None   Collection Time: 10/28/18 11:51 PM  Result Value  Ref Range   Influenza A By PCR NEGATIVE NEGATIVE   Influenza B By PCR NEGATIVE NEGATIVE    Comment: (NOTE) The Xpert Xpress Flu assay is intended as an aid in the diagnosis of  influenza and should not be used as a sole basis for treatment.  This  assay is FDA approved for nasopharyngeal swab specimens only. Nasal  washings and aspirates are unacceptable for Xpert Xpress Flu testing. Performed at [facility], [address] [place], [place] [address]   Urinalysis, Complete w Microscopic     Status: Abnormal   Collection Time: 10/28/18 11:51 PM  Result Value Ref Range   Color, Urine YELLOW (A) YELLOW   APPearance CLEAR (A) CLEAR   Specific Gravity, Urine 1.017 1.005 - 1.030   pH 6.0 5.0 - 8.0   Glucose, UA NEGATIVE NEGATIVE mg/dL   Hgb urine dipstick NEGATIVE NEGATIVE   Bilirubin Urine NEGATIVE NEGATIVE   Ketones, ur NEGATIVE NEGATIVE mg/dL   Protein, ur NEGATIVE NEGATIVE mg/dL   Nitrite NEGATIVE NEGATIVE   Leukocytes,Ua NEGATIVE NEGATIVE   RBC / HPF 0-5 0 - 5 RBC/hpf   WBC, UA 0-5 0 - 5 WBC/hpf   Bacteria, UA NONE SEEN NONE SEEN   Squamous Epithelial / LPF 0-5 0 - 5   Mucus PRESENT    Hyaline Casts, UA PRESENT     Comment: Performed at [facility], [address] [place], [place] [address]  Glucose, capillary     Status: Abnormal   Collection Time: 10/29/18  8:06 AM  Result Value Ref Range   Glucose-Capillary 175 (H) 70 - 99 mg/dL    Current Facility-Administered Medications  Medication Dose Route Frequency Provider Last Rate Last Dose  . acetaminophen (TYLENOL) tablet 650 mg  650 mg Oral Q6H PRN Barbaraann Rondo, MD       Or  . acetaminophen (TYLENOL) suppository 650 mg  650 mg Rectal Q6H PRN Barbaraann Rondo, MD      . azithromycin (ZITHROMAX) 500 mg in sodium chloride 0.9 % 250 mL IVPB  500 mg Intravenous Once Barbaraann Rondo, MD      . bisacodyl (DULCOLAX) EC tablet 5 mg  5 mg Oral Daily PRN Barbaraann Rondo, MD      . ceFAZolin  (ANCEF) IVPB 1 g/50 mL premix  1 g Intravenous Q8H Sridharan, Prasanna, MD      . cyclobenzaprine (FLEXERIL) tablet 5 mg  5 mg Oral Once Barbaraann Rondo, MD      . dexmedetomidine (PRECEDEX) 200 MCG/50ML (4 mcg/mL) infusion  0.4-1.2 mcg/kg/hr Intravenous Titrated Mansy, Jan A, MD      . enoxaparin (LOVENOX) injection 40 mg  40 mg Subcutaneous Q24H Sridharan, Prasanna, MD      . furosemide (LASIX) tablet 20 mg  20 mg Oral Daily Sridharan, Prasanna, MD      . latanoprost (XALATAN) 0.005 % ophthalmic solution 1 drop  1 drop Both Eyes QHS Sridharan, Prasanna, MD      . levothyroxine (SYNTHROID, LEVOTHROID) tablet 137 mcg  137 mcg Oral QAC breakfast Barbaraann Rondo, MD      . liver oil-zinc oxide (DESITIN) 40 % ointment   Topical Daily Sridharan, Prasanna, MD      . MEDLINE mouth rinse  15 mL Mouth Rinse BID  Barbaraann Rondo, MD      . methylPREDNISolone sodium succinate (SOLU-MEDROL) 125 mg/2 mL injection 60 mg  60 mg Intravenous Q12H Barbaraann Rondo, MD       Followed by  . [START ON 10/31/2018] predniSONE (DELTASONE) tablet 40 mg  40 mg Oral Q breakfast Barbaraann Rondo, MD      . metoprolol succinate (TOPROL-XL) 24 hr tablet 25 mg  25 mg Oral Daily Sridharan, Prasanna, MD      . promethazine (PHENERGAN) injection 12.5 mg  12.5 mg Intravenous Q6H PRN Barbaraann Rondo, MD      . senna-docusate (Senokot-S) tablet 1 tablet  1 tablet Oral QHS PRN Barbaraann Rondo, MD        Musculoskeletal: Strength & Muscle Tone: did not assess Gait & Station: unable to assess Patient leans: N/A  Psychiatric Specialty Exam: Physical Exam  ROS  Blood pressure 135/88, pulse 92, temperature 98.1 F (36.7 C), temperature source Axillary, resp. rate 20, height  (1.575 m), weight 111.9 kg, SpO2 92 %.Body mass index is 45.12 kg/m.  General Appearance: elderly CM, in bed, appears sedated  Eye Contact:  Absent  Speech:  patient did not participate in conversation  Volume:  N/A  Mood:   unable to assess  Affect:  unable to assess  Thought Process:  NA Unable to assess  Orientation:  NA  Thought Content:  NA  Suicidal Thoughts:  unable to assess  Homicidal Thoughts:  unable to assess  Memory:  NA  Judgement:  NA  Insight:  NA                             Treatment Plan Summary: Daily contact with patient to assess and evaluate symptoms and progress in treatment  Eric [name] is a 73 y.o. male patient with a medical history of COPD, HTN, thyroid disease, and a psych history of depression and anxiety, who was admitted last night to general medicine service from [facility] facility with acute respiratory failure and agitation. Patient appears sedated and was not able to participate in interview. Per chart review, patient has no past history of psych admissions or psych-related ED visits. I attempted to reach patient`s son, listed in the chart, for collateral info re baseline mental status - no phone answer. My clinical suspicion for delirium is high at this time due to sudden mental status change, abnormal vitals on arrival and labs. Patient needs to be reassessed further.  Impression: R/o delirium  Recommendations: -patient does not meet criteria for inpatient psych admission currently until delirium is ruled out.  -As with all hospitalized patients, would recommend delirium precautions: Minimize use of benzodiazepines, anticholinergics, and opiates, to the extent possible, as these can worsen mentation. Utilize non-pharmacological interventions including frequent reorientation, maintaining day/night distinction (blinds closed during night, open during day), minimizing excessive stimulation, staff continuity, etc.  -can use Zyprexa  IM Q6H PRN agitation - order placed.  -agree with Precedex drip for severe agitation in ICU (order placed by ICU-provider)  -psychiatry will follow daily.      Thalia Party, MD 10/29/2018 8:33 AM

## 2018-10-29 NOTE — Progress Notes (Signed)
Patient is being very aggressive and verbally abusive to staff. Patient has already pulled out one IV and is refusing all medications. MD Marjie Skiff and house supervisor Ann notified. Zyprexa IM 5mg  once ordered and per MD, if patient continues with his attitude, IVC can be considered. Nursing staff will continue to monitor for any changes in patient status. Lamonte Richer, RN

## 2018-10-29 NOTE — ED Notes (Signed)
Patient remains extremely paranoid and convinced that "the hospital is trying to poison and kill him"; RN able to hang second antibiotic but unable to attempt any other actions. Will reassess

## 2018-10-29 NOTE — Progress Notes (Signed)
Pt from [facility] facility admitted with acute on chronic hypoxic respiratory failure secondary to AECOPD pt COVID-19 r/o.  Since admission pt has been aggressive, agitated, verbally abusive to staff, and refusing care. Per ER notes the pt has refused medications and distributed aggressive behavior. He was admitted to the telemetry unit and due to aggressive behavior he received 5 mg iv valium, 50 mg iv benadryl, 5 mg zyprexa, and 10 mg geodon.  Despite multiple medications he remains aggressive, paranoid, and agitated.  Therefore, pt transferring to ICU for precedex gtt and psychiatry consult placed due to possible psychotic episode.  Hospitalist Dr. Arville Care to complete IVC paperwork.  Sonda Rumble, AGNP  Pulmonary/Critical Care Pager [phone] (please enter 7 digits) PCCM Consult Pager [phone] (please enter 7 digits)

## 2018-10-29 NOTE — ED Notes (Signed)
Patient transported to room 236 following precautions. Patient extremely irritable and paranoid.

## 2018-10-29 NOTE — ED Notes (Signed)
Will transport pt to floor once [place] complete. Will hang additional meds when lines/compatibility allow & when pt agrees as pt remains paranoid.

## 2018-10-29 NOTE — Progress Notes (Signed)
Patient is alert and oriented, but lacks insight into his medical condition and prognosis. At present, he is pulling out his lines, refusing antibiotics and being abusive to staff. His statements reflect paranoia and racism. It appears it is more important to him to exhibit his behavioural issues for all to see (as he continues to behave in a cantankerous and uncooperative manner), than to benefit his own health and well-being through the prescribed medical treatment. Furthermore, if he is indeed COVID (+), he poses a public health risk. As such, he cannot leave the hospital against medical advice. If he attempts to leave, he will require involuntary commitment.  -P. Zyairah Wacha (Nocturnist/Hospitalist)

## 2018-10-29 NOTE — ED Notes (Signed)
Patient very disagreeable to being stuck for second set of blood cultures. RN attempted x2, unable to obtain. MD notified.

## 2018-10-29 NOTE — Progress Notes (Signed)
The patient continued to be belligerent and pulled his IV again.  He has been verbally abusive to the staff and would not accept his medications.  He has been refusing his medications at his facility unlikely has not been taking Synthroid as his TSH was 20.  He received 5 mg of p.o. Zyprexa earlier and later I ordered 10 mg of IM Geodon and 50 mg of p.o. Benadryl which calmed him for short period and  then he became significantly agitated again.  I went to the room to examine him and he would not let me touch him.  It took 2 staff members to hold his arm to give him the IM Geodon earlier.  He was verbally abusive to me but was not physically aggressive as I we did not approach him.  Being rule out cope with patient I believe that he presents a danger to the staff in addition to being a danger to himself and needs to be involuntarily committed.  I placed order for transfer to the ICU and discussed it with the ICU nurse practitioner Annabelle Harman who made Dr. Darrol Angel aware.  I ordered IV Precedex infusion.  The patient was accepted for transfer to the ICU.  Involuntary commitment document was not available on the floor.  It was requested to be filled when available.

## 2018-10-29 NOTE — Progress Notes (Signed)
Patient admitted with altered mental status and psychosis.  Transferred to ICU/SDU for dexmedetomidine infusion.  For most of the day, he has been calm without dexmedetomidine.  However, he started to become agitated and it has now been initiated.  I have stopped systemic steroids in light of his apparent psychosis.  These can only exacerbate his psychiatric problems.  PCCM service will continue to help manage while he is in the ICU/SDU  Billy Fischer, MD PCCM service Mobile [phone] Pager [phone] 10/29/2018 3:35 PM

## 2018-10-29 NOTE — ED Notes (Signed)
Patient extremely disagreeable and suspicious of all medications RN attempts to administer. RN explained importance of antibiotics and medications such as inhalers- patient refused more meds at this time, will attempt again

## 2018-10-30 ENCOUNTER — Inpatient Hospital Stay: Payer: Medicare Other

## 2018-10-30 LAB — BASIC METABOLIC PANEL
Anion gap: 6 (ref 5–15)
BUN: 18 mg/dL (ref 8–23)
CO2: 30 mmol/L (ref 22–32)
Calcium: 8.1 mg/dL — ABNORMAL LOW (ref 8.9–10.3)
Chloride: 103 mmol/L (ref 98–111)
Creatinine, Ser: 0.83 mg/dL (ref 0.61–1.24)
GFR calc Af Amer: >60 mL/min (ref >60)
GFR calc non Af Amer: >60 mL/min (ref >60)
Glucose, Bld: 146 mg/dL — ABNORMAL HIGH (ref 70–99)
Potassium: 4.1 mmol/L (ref 3.5–5.1)
Sodium: 139 mmol/L (ref 135–145)

## 2018-10-30 LAB — CBC
HCT: 38 % — ABNORMAL LOW (ref 39.0–52.0)
Hemoglobin: 12.2 g/dL — ABNORMAL LOW (ref 13.0–17.0)
MCH: 30.5 pg (ref 26.0–34.0)
MCHC: 32.1 g/dL (ref 30.0–36.0)
MCV: 95 fL (ref 80.0–100.0)
Platelets: 194 10*3/uL (ref 150–400)
RBC: 4 MIL/uL — ABNORMAL LOW (ref 4.22–5.81)
RDW: 13.3 % (ref 11.5–15.5)
WBC: 11.6 10*3/uL — ABNORMAL HIGH (ref 4.0–10.5)
nRBC: 0 % (ref 0.0–0.2)

## 2018-10-30 LAB — SARS CORONAVIRUS 2 BY RT PCR (HOSPITAL ORDER, PERFORMED IN ~~LOC~~ HOSPITAL LAB): SARS Coronavirus 2: NEGATIVE

## 2018-10-30 LAB — PREALBUMIN: Prealbumin: 14.3 mg/dL — ABNORMAL LOW (ref 18–38)

## 2018-10-30 LAB — MAGNESIUM: Magnesium: 2.2 mg/dL (ref 1.7–2.4)

## 2018-10-30 LAB — PHOSPHORUS: Phosphorus: 3.8 mg/dL (ref 2.5–4.6)

## 2018-10-30 MED ORDER — ENOXAPARIN SODIUM 40 MG/0.4ML ~~LOC~~ SOLN: 40.0000 mg | SUBCUTANEOUS | Status: DC

## 2018-10-30 MED ORDER — FUROSEMIDE 20 MG PO TABS
20.0000 mg | ORAL_TABLET | Freq: Every day | ORAL | Status: DC
  Administered 2018-11-01 – 2018-11-03 (×3): 20 mg via ORAL
  Filled 2018-10-30 (×4): qty 1

## 2018-10-30 MED ORDER — DIAZEPAM 5 MG PO TABS
10.0000 mg | ORAL_TABLET | Freq: Four times a day (QID) | ORAL | Status: DC | PRN
  Administered 2018-10-30 – 2018-11-01 (×4): 10 mg via ORAL
  Filled 2018-10-30 (×4): qty 2
  Filled 2018-10-30: qty 5

## 2018-10-30 MED ORDER — METOPROLOL SUCCINATE ER 25 MG PO TB24
25.0000 mg | ORAL_TABLET | Freq: Every day | ORAL | Status: DC
  Administered 2018-11-01 – 2018-11-03 (×3): 25 mg via ORAL
  Filled 2018-10-30 (×4): qty 1

## 2018-10-30 MED ORDER — LATANOPROST 0.005 % OP SOLN
1.0000 [drp] | OPHTHALMIC | Status: DC
  Administered 2018-11-02: 1 [drp] via OPHTHALMIC
  Filled 2018-10-30 (×2): qty 2.5

## 2018-10-30 MED ORDER — ZINC OXIDE 40 % EX OINT
TOPICAL_OINTMENT | Freq: Every day | CUTANEOUS | Status: DC
  Administered 2018-11-01: 1 via TOPICAL
  Filled 2018-10-30: qty 57

## 2018-10-30 NOTE — Progress Notes (Signed)
Patient very anxious with tele sitter.  Pulling on IV.  Constantly calling out for help as soon as help finishes up and leaves his room

## 2018-10-30 NOTE — Progress Notes (Signed)
Transfer to [facility] room 204 via 2C bed, on 4 liters O2, VSS, report called to [facility], pt verbalizes understanding of transfer.  Belongings and  Chart transfer with him

## 2018-10-30 NOTE — Progress Notes (Signed)
Patient arrived from ICU.  Sitter, [place], with the patient the entire time.  Patient can be quite loud and demanding at times.

## 2018-10-30 NOTE — Progress Notes (Signed)
[facility] [place] at [facility]   PATIENT NAME: Eric [name]    MR#:  [id number]  DATE OF BIRTH:  1946/07/05  SUBJECTIVE:   Patient angry, refusing care. Transferred from the ICU to the medical floor today. Unable to tolerate precedex.  REVIEW OF SYSTEMS:  ROS- unable to obtain due to patient refusal  DRUG ALLERGIES:   Allergies  Allergen Reactions  . Demerol [Meperidine] Other (See Comments)    Will counteract with a drug he is taking causing fatal reaction with nardil  . Epinephrine Other (See Comments)    Nardil reaction-With epi?  . Nardil [Phenelzine] Other (See Comments)  . Morphine And Related Other (See Comments)    Reacts with nardil  . Ondansetron     Made patient want to climb the walls  . Shellfish Allergy Hives   VITALS:  Blood pressure 122/70, pulse 86, temperature (!) 97.5 F (36.4 C), temperature source Oral, resp. rate (!) 22, height 5\' 2"  (1.575 m), weight 111.9 kg, SpO2 93 %. PHYSICAL EXAMINATION:  Physical Exam  GENERAL:  Laying in the bed, angry, yelling  HEENT: Head atraumatic, normocephalic. Pupils equal, round, reactive to light and accommodation. No scleral icterus. Extraocular muscles intact. Oropharynx and nasopharynx clear.  NECK:  Supple, no jugular venous distention. No thyroid enlargement. LUNGS: difficult to auscultate due to lack of cooperation.  CARDIOVASCULAR: RRR ABDOMEN: Soft, nontender, nondistended. Bowel sounds present.  EXTREMITIES: No pedal edema, cyanosis, or clubbing.  NEUROLOGIC: CN 2-12 intact,moving all extremities, no obvious focal deficits  PSYCHIATRIC: The patient is alert and oriented x 3.  SKIN: No obvious rash, lesion, or ulcer.  LABORATORY PANEL:  Male CBC Recent Labs  Lab 10/30/18 0433  WBC 11.6*  HGB 12.2*  HCT 38.0*  PLT 194   ------------------------------------------------------------------------------------------------------------------ Chemistries  Recent Labs  Lab 10/28/18  2250 10/30/18 0401 10/30/18 0433  NA 135 139  --   K 4.3 4.1  --   CL 99 103  --   CO2 29 30  --   GLUCOSE 153* 146*  --   BUN 15 18  --   CREATININE 1.00 0.83  --   CALCIUM 8.2* 8.1*  --   MG 2.0  --  2.2  AST 16  --   --   ALT 13  --   --   ALKPHOS 69  --   --   BILITOT 1.1  --   --    RADIOLOGY:  Dg Chest Port 1 View  Result Date: 10/30/2018 CLINICAL DATA:  73 year old with cellulitis. EXAM: PORTABLE CHEST 1 VIEW COMPARISON:  10/28/2018 FINDINGS: Cardiac silhouette is upper limits of normal but likely accentuated by the portable semi upright technique. Slightly prominent central vascular structures have minimally changed. Mild elevation of the right hemidiaphragm with increased densities at the right lung base. Probable overlying shadows at the right lung apex region. IMPRESSION: New densities at the right lung base are most compatible with atelectasis. Electronically Signed   By: Richarda OverlieAdam  Henn M.D.   On: 10/30/2018 09:34   ASSESSMENT AND PLAN:   Acute hypoxic respiratory failure secondary to COPD exacerbation-patient currently on 4 L O2 -COVID testing is negative -DuoNebs PRN  Scrotal cellulitis-no signs of sepsis -Continue Ancef -Blood cultures with no growth to date  Chronic systolic congestive heart failure-last echo 07/26/2018 with EF 30 to 35% -Continue Lasix and metoprolol  Aggressive behavior  -Unable to tolerate Precedex drip due to bradycardia -Secondary school teacherne-to-one sitter -Psych consult- recommend using Zyprexa  as needed for agitation  Hypothyroidism-stable -Continue home Synthroid  All the records are reviewed and case discussed with Care Management/Social Worker. Management plans discussed with the patient, family and they are in agreement.  CODE STATUS: Full Code  TOTAL TIME TAKING CARE OF THIS PATIENT: 45 minutes.   More than 50% of the time was spent in counseling/coordination of care: YES  POSSIBLE D/C IN 2-3 DAYS, DEPENDING ON CLINICAL CONDITION.   Jinny Blossom   M.D on 10/30/2018 at 4:47 PM  Between 7am to 6pm - Pager - [phone]  After 6pm go to [email or web address] - Social research officer, government  [facility] [place] [facility]  Office  [phone]  CC: Primary care physician; Dortha Kern, MD  Note: This dictation was prepared with Dragon dictation along with smaller phrase technology. Any transcriptional errors that result from this process are unintentional.

## 2018-10-31 ENCOUNTER — Inpatient Hospital Stay: Payer: Medicare Other

## 2018-10-31 LAB — BASIC METABOLIC PANEL
Anion gap: 6 (ref 5–15)
BUN: 23 mg/dL (ref 8–23)
CO2: 32 mmol/L (ref 22–32)
Calcium: 7.9 mg/dL — ABNORMAL LOW (ref 8.9–10.3)
Chloride: 101 mmol/L (ref 98–111)
Creatinine, Ser: 1.01 mg/dL (ref 0.61–1.24)
GFR calc Af Amer: >60 mL/min (ref >60)
GFR calc non Af Amer: >60 mL/min (ref >60)
Glucose, Bld: 109 mg/dL — ABNORMAL HIGH (ref 70–99)
Potassium: 3.8 mmol/L (ref 3.5–5.1)
Sodium: 139 mmol/L (ref 135–145)

## 2018-10-31 LAB — CBC
HCT: 41.3 % (ref 39.0–52.0)
Hemoglobin: 13.2 g/dL (ref 13.0–17.0)
MCH: 30.5 pg (ref 26.0–34.0)
MCHC: 32 g/dL (ref 30.0–36.0)
MCV: 95.4 fL (ref 80.0–100.0)
Platelets: 220 10*3/uL (ref 150–400)
RBC: 4.33 MIL/uL (ref 4.22–5.81)
RDW: 13.8 % (ref 11.5–15.5)
WBC: 11 10*3/uL — ABNORMAL HIGH (ref 4.0–10.5)
nRBC: 0 % (ref 0.0–0.2)

## 2018-10-31 LAB — MAGNESIUM: Magnesium: 2 mg/dL (ref 1.7–2.4)

## 2018-10-31 MED ORDER — METOPROLOL TARTRATE 5 MG/5ML IV SOLN
5.0000 mg | INTRAVENOUS | Status: DC | PRN
  Administered 2018-10-31: 11:00:00 5 mg via INTRAVENOUS
  Filled 2018-10-31 (×2): qty 5

## 2018-10-31 MED ORDER — OLANZAPINE 10 MG IM SOLR
10.0000 mg | Freq: Two times a day (BID) | INTRAMUSCULAR | Status: DC
  Administered 2018-10-31 – 2018-11-02 (×3): 10 mg via INTRAMUSCULAR
  Filled 2018-10-31 (×5): qty 10

## 2018-10-31 MED ORDER — ENOXAPARIN SODIUM 40 MG/0.4ML ~~LOC~~ SOLN
40.0000 mg | Freq: Two times a day (BID) | SUBCUTANEOUS | Status: DC
  Administered 2018-11-01: 40 mg via SUBCUTANEOUS
  Filled 2018-10-31 (×3): qty 0.4

## 2018-10-31 MED ORDER — LABETALOL HCL 5 MG/ML IV SOLN
INTRAVENOUS | Status: AC
  Filled 2018-10-31: qty 4

## 2018-10-31 MED ORDER — POTASSIUM CHLORIDE 20 MEQ PO PACK
40.0000 meq | PACK | Freq: Once | ORAL | Status: DC
  Filled 2018-10-31: qty 2

## 2018-10-31 MED ORDER — OLANZAPINE 10 MG IM SOLR
5.0000 mg | Freq: Once | INTRAMUSCULAR | Status: AC | PRN
  Administered 2018-10-31: 5 mg via INTRAMUSCULAR
  Filled 2018-10-31: qty 10

## 2018-10-31 MED ORDER — SERTRALINE HCL 50 MG PO TABS
25.0000 mg | ORAL_TABLET | Freq: Every day | ORAL | Status: DC
  Administered 2018-11-01: 10:00:00 25 mg via ORAL
  Filled 2018-10-31 (×3): qty 1

## 2018-10-31 NOTE — Progress Notes (Signed)
Dr. Jola Babinski was notified of pt. behavior of being combative, non cooperative, paranoid and refused to do anything including refusing to take PO meds and IV med even after two dose of zyprexa IM .  CT order is on hold due to pt. behavior. Dr. Jola Babinski changed zyprexa dose and make it as scheduled. Dr. Nancy Marus is aware of situation as well.

## 2018-10-31 NOTE — Progress Notes (Signed)
Patient has refused medications throughout the night. Nurse spoke with Dr Arville Care concerning telemetry and labs. Nurse has attempted multiple times to explain the need for patient to take medication to improve his symptoms without success. Charge nurse Windy Carina, RN talked with pt several times as well as Nursing Supervisor talked with pt explaining need for antibiotic as well as potassium. Pt has refused. He gets agitated, confused, argumentative, uses profanity as well as racist statements.

## 2018-10-31 NOTE — Progress Notes (Addendum)
[facility] [place] at [facility]   PATIENT NAME: Eric [name]    MR#:  [id number]  DATE OF BIRTH:  06/12/1946  SUBJECTIVE:   Patient initially calm and answering questions appropriately this morning.  States that his shortness of breath has improved.  Denies any pain.  Later on this morning, patient became aggressive and agitated.  Refused care and refused to take medications.  Remains on 3 L O2.  REVIEW OF SYSTEMS:  Review of Systems  Constitutional: Negative for chills and fever.  HENT: Negative for congestion and sore throat.   Eyes: Negative for blurred vision and double vision.  Respiratory: Positive for shortness of breath. Negative for cough.   Cardiovascular: Negative for chest pain and leg swelling.  Gastrointestinal: Negative for nausea and vomiting.  Genitourinary: Negative for dysuria and urgency.  Musculoskeletal: Negative for back pain and neck pain.  Neurological: Negative for dizziness and headaches.  Psychiatric/Behavioral: Negative for depression. The patient is not nervous/anxious.     DRUG ALLERGIES:   Allergies  Allergen Reactions  . Demerol [Meperidine] Other (See Comments)    Will counteract with a drug he is taking causing fatal reaction with nardil  . Epinephrine Other (See Comments)    Nardil reaction-With epi?  . Nardil [Phenelzine] Other (See Comments)  . Morphine And Related Other (See Comments)    Reacts with nardil  . Ondansetron     Made patient want to climb the walls  . Shellfish Allergy Hives   VITALS:  Blood pressure 114/89, pulse (!) 205, temperature 98.3 F (36.8 C), temperature source Oral, resp. rate (!) 24, height 5\' 2"  (1.575 m), weight 111.9 kg, SpO2 95 %. PHYSICAL EXAMINATION:  Physical Exam  GENERAL:   Sitting up on the edge of the bed, in no acute distress HEENT: Head atraumatic, normocephalic. Pupils equal, round, reactive to light and accommodation. No scleral icterus. Extraocular muscles intact.  Oropharynx and nasopharynx clear.  NECK:  Supple, no jugular venous distention. No thyroid enlargement. LUNGS:  Diminished breath sounds throughout all lung fields bilaterally, no wheezing, crackles, or rhonchi.  Nasal cannula in place.  Able to speak in full sentences. CARDIOVASCULAR: RRR, no murmurs, rubs, gallops ABDOMEN: Soft, nontender, nondistended. Bowel sounds present.  EXTREMITIES: No pedal edema, cyanosis, or clubbing.  NEUROLOGIC: CN 2-12 intact,moving all extremities, no obvious focal deficits  PSYCHIATRIC: The patient is alert and oriented x 3, currently calm SKIN: No obvious rash, lesion, or ulcer.  LABORATORY PANEL:  Male CBC Recent Labs  Lab 10/31/18 0529  WBC 11.0*  HGB 13.2  HCT 41.3  PLT 220   ------------------------------------------------------------------------------------------------------------------ Chemistries  Recent Labs  Lab 10/28/18 2250  10/31/18 0529  NA 135   < > 139  K 4.3   < > 3.8  CL 99   < > 101  CO2 29   < > 32  GLUCOSE 153*   < > 109*  BUN 15   < > 23  CREATININE 1.00   < > 1.01  CALCIUM 8.2*   < > 7.9*  MG 2.0   < > 2.0  AST 16  --   --   ALT 13  --   --   ALKPHOS 69  --   --   BILITOT 1.1  --   --    < > = values in this interval not displayed.   RADIOLOGY:  No results found. ASSESSMENT AND PLAN:   Acute hypoxic respiratory failure secondary to COPD exacerbation-patient  currently on 3 L O2. Patient thinks he may use 2 L O2 at night, but he is unsure. -COVID testing is negative -DuoNebs PRN -Wean O2 as able  Scrotal cellulitis-no signs of sepsis -Scrotal US with extensive edema of the scrotal wall -Continue Ancef -Blood cultures with no growth to date  Sinus tachycardia- likely related to agitation. -Will attempt to treat agitation -Metoprolol ordered, but patient is refusing  Aggressive behavior- patient does have a history of dementia -Unable to tolerate Precedex drip due to bradycardia -Secondary school teacher -Psych  consult- recommend Hiprex at 5 mg as needed agitation, Valium 10 mg 3 times daily as needed, Zoloft -CT head ordered per psych recommendations- unclear if patient will tolerate this  Chronic systolic congestive heart failure-last echo 07/26/2018 with EF 30 to 35% -Continue Lasix and metoprolol- patient is refusing both of these medications  Hypothyroidism-stable -Continue home Synthroid  All the records are reviewed and case discussed with Care Management/Social Worker. Management plans discussed with the patient, family and they are in agreement.  CODE STATUS: Full Code  TOTAL TIME TAKING CARE OF THIS PATIENT: 40 minutes.   More than 50% of the time was spent in counseling/coordination of care: YES  POSSIBLE D/C IN 1-2 DAYS, DEPENDING ON CLINICAL CONDITION.   [name] M.D on 10/31/2018 at 11:43 AM  Between 7am to 6pm - Pager - [phone]  After 6pm go to [email or web address] - Social research officer, government  [facility] [place] [facility]  Office  [phone]  CC: Primary care physician; Dortha Kern, MD  Note: This dictation was prepared with Dragon dictation along with smaller phrase technology. Any transcriptional errors that result from this process are unintentional.

## 2018-10-31 NOTE — Progress Notes (Signed)
PHARMACIST - PHYSICIAN COMMUNICATION  CONCERNING:  Enoxaparin (Lovenox) for DVT Prophylaxis    RECOMMENDATION: Patient was prescribed enoxaprin 40mg  q24 hours for VTE prophylaxis.   Filed Weights   10/28/18 2309 10/29/18 0226 10/29/18 0800  Weight: 242 lb (109.8 kg) 243 lb 8 oz (110.5 kg) 246 lb 11.1 oz (111.9 kg)    Body mass index is 45.12 kg/m.  Estimated Creatinine Clearance: 72.5 mL/min (by C-G formula based on SCr of 1.01 mg/dL).   Based on [facility] policy patient is candidate for enoxaparin 40mg  every 12 hour dosing due to BMI being >40.  DESCRIPTION: Pharmacy has adjusted enoxaparin dose per [facility] policy.  Patient is now receiving enoxaparin 40mg  every 12 hours.   Albina Billet, PharmD, BCPS Clinical Pharmacist 10/31/2018 11:33 AM

## 2018-10-31 NOTE — NC FL2 (Addendum)
[place] MEDICAID FL2 LEVEL OF CARE SCREENING TOOL     IDENTIFICATION  Patient Name: Eric [name] Birthdate: 1945-11-23 Sex: male Admission Date (Current Location): 10/28/2018  [facility] and [place] Number:  Chiropodist and Address:  [facility], [address] [place], [place] [address]      Provider Number: [id number]  Attending Physician Name and Address:  Campbell Stall, MD  Relative Name and Phone Number:       Current Level of Care: Hospital Recommended Level of Care: Assisted Living Facility Prior Approval Number:    Date Approved/Denied:   PASRR Number:    Discharge Plan: (ALF)    Current Diagnoses: Patient Active Problem List   Diagnosis Date Noted  . Acute hypoxemic respiratory failure (HCC) 10/29/2018  . Suspected Covid-19 Virus Infection 10/29/2018  . Delirium     Orientation RESPIRATION BLADDER Height & Weight     Self, Place  Normal, O2(3 liters) Continent Weight: 246 lb 11.1 oz (111.9 kg) Height:  5\' 2"  (157.5 cm)  BEHAVIORAL SYMPTOMS/MOOD NEUROLOGICAL BOWEL NUTRITION STATUS  (none) (none) Incontinent Diet(heart healthy)  AMBULATORY STATUS COMMUNICATION OF NEEDS Skin   Supervision Verbally Normal                       Personal Care Assistance Level of Assistance  Bathing, Dressing, Feeding Bathing Assistance: Limited assistance Feeding assistance: Independent Dressing Assistance: Limited assistance     Functional Limitations Info  Sight Sight Info: Impaired        SPECIAL CARE FACTORS FREQUENCY                       Contractures Contractures Info: Not present    Additional Factors Info  Code Status Code Status Info: full             Medication List        STOP taking these medications       amLODipine 10 MG tablet Commonly known as:  NORVASC   dimenhyDRINATE 50 MG tablet Commonly known as:  DRAMAMINE             TAKE these medications       acetaminophen  325 MG tablet Commonly known as:  TYLENOL Take 2 tablets (650 mg total) by mouth every 6 (six) hours as needed for mild pain (or Fever >/= 101).   albuterol 0.63 MG/3ML nebulizer solution Commonly known as:  ACCUNEB Take 1 ampule by nebulization every 6 (six) hours as needed for shortness of breath.   bimatoprost 0.01 % Soln Commonly known as:  LUMIGAN Place 1 drop into both eyes at bedtime.   cephALEXin 500 MG capsule Commonly known as:  KEFLEX Take 1 capsule (500 mg total) by mouth every 8 (eight) hours.   diazepam 10 MG tablet Commonly known as:  VALIUM Take 1 tablet (10 mg total) by mouth every 6 (six) hours as needed for anxiety.   furosemide 20 MG tablet Commonly known as:  LASIX Take 1 tablet (20 mg total) by mouth daily. Start taking on:  November 04, 2018 What changed:    medication strength  how much to take  when to take this   HYDROcodone-acetaminophen 7.5-325 MG tablet Commonly known as:  NORCO Take 1 tablet by mouth every 6 (six) hours as needed for moderate pain or severe pain. For pain   levothyroxine 150 MCG tablet Commonly known as:  Synthroid Take 1 tablet (150 mcg  total) by mouth daily. What changed:    medication strength  how much to take  when to take this   liver oil-zinc oxide 40 % ointment Commonly known as:  DESITIN Apply topically daily. Start taking on:  November 04, 2018   Melatonin 3 MG Tabs Take 1 tablet by mouth daily.   metoprolol succinate 25 MG 24 hr tablet Commonly known as:  TOPROL-XL Take 1 tablet (25 mg total) by mouth daily. Start taking on:  November 04, 2018 What changed:    medication strength  how much to take  additional instructions   OLANZapine zydis 10 MG disintegrating tablet Commonly known as:  ZYPREXA Take 1 tablet (10 mg total) by mouth 2 (two) times daily.   Senexon-S 8.6-50 MG tablet Generic drug:  senna-docusate Take 2 tablets by mouth 2 (two) times daily.   sertraline 50 MG  tablet Commonly known as:  ZOLOFT Take 1 tablet (50 mg total) by mouth daily.   vitamin B-12 500 MCG tablet Commonly known as:  CYANOCOBALAMIN Take 500 mcg by mouth 3 (three) times a week.   Vitamin D (Ergocalciferol) 1.25 MG (50000 UT) Caps capsule Commonly known as:  DRISDOL Take 50,000 Units by mouth every 7 (seven) days.            Additional Information    York SpanielMonica Jaynell Castagnola, LCSW

## 2018-10-31 NOTE — Progress Notes (Signed)
Pt HR has been sustaining to 190's since 9:45 am. MD was notified and ordered metoprolol IV however pt. is confused and combative. He was able to get Zyprexa IM with the help of several staff in the room. Waiting for this med to kick in so metoprolol IV can be given. At this time, several staff in the room waiting for pt to calm down so metoprolol IV can be given.

## 2018-10-31 NOTE — Consult Note (Signed)
[facility] Face-to-Face Psychiatry Consult   Reason for Consult:   Referring Physician:  Sridharan Patient Identification: Eric [name] MRN:  [id number] Principal Diagnosis: <principal problem not specified> Diagnosis:  Active Problems:   Acute hypoxemic respiratory failure (HCC)   Suspected Covid-19 Virus Infection   Delirium   Total Time spent with patient: 15 minutes  Subjective:   Eric [name] is a 73 y.o. male patient admitted with urinary retention and shortness of breath..  HPI: Patient is seen and examined.  Patient is a 73 year old male with a past medical history significant for history of COPD, chronic respiratory failure, hypoxemia and hypercapnia, atrial fibrillation, lower extremity edema, generalized anxiety, and recent delirium secondary to benzodiazepine withdrawal.  Patient on admission was confused, agitated, and thought to be paranoid as well as racist.  He was refusing medications.  Psychiatric consultation was obtained. Dr Windy Kalata saw the patient in consultation on 10/29/2018.  The patient received 10 mg of Geodon on the medical floor, as well as intramuscular Benadryl.  He was transferred to the intensive care unit for Precedex.  Her visit was limited secondary to his sedation.  He is awake and alert today.  It was noted in the medical record that the patient had refused medical care and had been unable to tolerate Precedex in the ICU.  He was transferred back to the medical floor.  It was thought that he continued to be agitated.  His current psychiatric medications include diazepam 10 mg p.o. every 6 hours and Zyprexa 5 mg IM every 6 hours PRN agitation.  He is receiving Ancef 1 g IV every 8 hours for cellulitis.  He is pleasant to me this a.m., but really only oriented to year and self.  He is unable to recognize that he is in the hospital.  He complained of anxiety and depression.  He stated his depression including feeling weak, lethargic, helpless and hopeless.  He denied  any suicidal ideation.  He stated "I want to live as long as I can".  He has a history of hypothyroidism and is currently on Synthroid 137 mcg p.o. daily.  Unfortunately his TSH on admission on 10/28/2018 was 20.585.  His BNP was 16 so clearly his heart failure is well controlled.  Electrolytes were essentially normal.  Blood sugar this a.m. is 175.  His influenza screens including coronavirus were all negative.  Cultures are negative x2.  His vital signs are stable, he is afebrile.  His pulse oximetry was a 95% on nasal cannula.  He is on 5 L O2.  Past Psychiatric History: Review of the electronic medical record revealed that he has a longstanding history of anxiety.  He has been treated with benzodiazepines (in particular diazepam 10 mg p.o. 3 times daily as needed anxiety) for several years.  It does not appear as though he is ever had any psychiatric hospitalizations.  Risk to Self:   Risk to Others:   Prior Inpatient Therapy:   Prior Outpatient Therapy:    Past Medical History:  Past Medical History:  Diagnosis Date  . Anxiety and depression   . Cataract   . Gallstones   . Glaucoma   . Hypertension   . Hypoglycemia   . Inguinal hernia   . Sleep apnea   . Thyroid disease     Past Surgical History:  Procedure Laterality Date  . FINGER AMPUTATION Right    Family History:  Family History  Problem Relation Age of Onset  . Alzheimer's disease Mother  Family Psychiatric  History: Noncontributory Social History:  Social History   Substance and Sexual Activity  Alcohol Use No     Social History   Substance and Sexual Activity  Drug Use No    Social History   Socioeconomic History  . Marital status: Divorced    Spouse name: Not on file  . Number of children: 3  . Years of education: Not on file  . Highest education level: Not on file  Occupational History  . Not on file  Social Needs  . Financial resource strain: Not on file  . Food insecurity:    Worry: Not on  file    Inability: Not on file  . Transportation needs:    Medical: Not on file    Non-medical: Not on file  Tobacco Use  . Smoking status: Former Smoker    Last attempt to quit: 10/18/2010    Years since quitting: 8.0  . Smokeless tobacco: Never Used  Substance and Sexual Activity  . Alcohol use: No  . Drug use: No  . Sexual activity: Not on file  Lifestyle  . Physical activity:    Days per week: Not on file    Minutes per session: Not on file  . Stress: Not on file  Relationships  . Social connections:    Talks on phone: Not on file    Gets together: Not on file    Attends religious service: Not on file    Active member of club or organization: Not on file    Attends meetings of clubs or organizations: Not on file    Relationship status: Not on file  Other Topics Concern  . Not on file  Social History Narrative  . Not on file   Additional Social History:    Allergies:   Allergies  Allergen Reactions  . Demerol [Meperidine] Other (See Comments)    Will counteract with a drug he is taking causing fatal reaction with nardil  . Epinephrine Other (See Comments)    Nardil reaction-With epi?  . Nardil [Phenelzine] Other (See Comments)  . Morphine And Related Other (See Comments)    Reacts with nardil  . Ondansetron     Made patient want to climb the walls  . Shellfish Allergy Hives    Labs:  Results for orders placed or performed during the hospital encounter of 10/28/18 (from the past 48 hour(s))  Prealbumin     Status: Abnormal   Collection Time: 10/30/18  4:01 AM  Result Value Ref Range   Prealbumin 14.3 (L) 18 - 38 mg/dL    Comment: Performed at [facility], [address] [place], [place] [address]  Basic metabolic panel     Status: Abnormal   Collection Time: 10/30/18  4:01 AM  Result Value Ref Range   Sodium 139 135 - 145 mmol/L   Potassium 4.1 3.5 - 5.1 mmol/L   Chloride 103 98 - 111 mmol/L   CO2 30 22 - 32 mmol/L   Glucose, Bld 146 (H) 70 - 99  mg/dL   BUN 18 8 - 23 mg/dL   Creatinine, Ser 7.42 0.61 - 1.24 mg/dL   Calcium 8.1 (L) 8.9 - 10.3 mg/dL   GFR calc non Af Amer >60 >60 mL/min   GFR calc Af Amer >60 >60 mL/min   Anion gap 6 5 - 15    Comment: Performed at [facility], [address], [place], [place] [address]  Magnesium  Status: None   Collection Time: 10/30/18  4:33 AM  Result Value Ref Range   Magnesium 2.2 1.7 - 2.4 mg/dL    Comment: Performed at [facility], [address] [place], [place] [address]  Phosphorus     Status: None   Collection Time: 10/30/18  4:33 AM  Result Value Ref Range   Phosphorus 3.8 2.5 - 4.6 mg/dL    Comment: Performed at [facility], [address] [place], [place] [address]  CBC     Status: Abnormal   Collection Time: 10/30/18  4:33 AM  Result Value Ref Range   WBC 11.6 (H) 4.0 - 10.5 K/uL   RBC 4.00 (L) 4.22 - 5.81 MIL/uL   Hemoglobin 12.2 (L) 13.0 - 17.0 g/dL   HCT 01.6 (L) 01.0 - 93.2 %   MCV 95.0 80.0 - 100.0 fL   MCH 30.5 26.0 - 34.0 pg   MCHC 32.1 30.0 - 36.0 g/dL   RDW 35.5 73.2 - 20.2 %   Platelets 194 150 - 400 K/uL   nRBC 0.0 0.0 - 0.2 %    Comment: Performed at [facility], [address] [place], [place] [address]  CBC     Status: Abnormal   Collection Time: 10/31/18  5:29 AM  Result Value Ref Range   WBC 11.0 (H) 4.0 - 10.5 K/uL   RBC 4.33 4.22 - 5.81 MIL/uL   Hemoglobin 13.2 13.0 - 17.0 g/dL   HCT 62.3 76.2 - 83.1 %   MCV 95.4 80.0 - 100.0 fL   MCH 30.5 26.0 - 34.0 pg   MCHC 32.0 30.0 - 36.0 g/dL   RDW 51.7 61.6 - 07.3 %   Platelets 220 150 - 400 K/uL   nRBC 0.0 0.0 - 0.2 %    Comment: Performed at [facility], [address] [place], [place] [address]  Magnesium     Status: None   Collection Time: 10/31/18  5:29 AM  Result Value Ref Range   Magnesium 2.0 1.7 - 2.4 mg/dL    Comment: Performed at [facility], [address] [place], [place] [address]  Basic metabolic panel     Status:  Abnormal   Collection Time: 10/31/18  5:29 AM  Result Value Ref Range   Sodium 139 135 - 145 mmol/L   Potassium 3.8 3.5 - 5.1 mmol/L   Chloride 101 98 - 111 mmol/L   CO2 32 22 - 32 mmol/L   Glucose, Bld 109 (H) 70 - 99 mg/dL   BUN 23 8 - 23 mg/dL   Creatinine, Ser 4.62 0.61 - 1.24 mg/dL   Calcium 7.9 (L) 8.9 - 10.3 mg/dL   GFR calc non Af Amer >60 >60 mL/min   GFR calc Af Amer >60 >60 mL/min   Anion gap 6 5 - 15    Comment: Performed at [facility], [address], [place], [place] [address]    Current Facility-Administered Medications  Medication Dose Route Frequency Provider Last Rate Last Dose  . acetaminophen (TYLENOL) tablet 650 mg  650 mg Oral Q6H PRN Barbaraann Rondo, MD       Or  . acetaminophen (TYLENOL) suppository 650 mg  650 mg Rectal Q6H PRN Barbaraann Rondo, MD      . bisacodyl (DULCOLAX) EC tablet 5 mg  5 mg Oral Daily PRN Barbaraann Rondo, MD      . ceFAZolin (ANCEF) IVPB 1 g/50 mL premix  1 g Intravenous Q8H Barbaraann Rondo, MD  Stopped at 10/30/18 1735  . diazepam (VALIUM) tablet 10 mg  10 mg Oral Q6H PRN Mayo, Allyn Kenner, MD   10 mg at 10/30/18 1808  . enoxaparin (LOVENOX) injection 40 mg  40 mg Subcutaneous Q24H Bertram Savin, RPH      . furosemide (LASIX) tablet 20 mg  20 mg Oral Daily Bertram Savin, RPH      . ipratropium-albuterol (DUONEB) 0.5-2.5 (3) MG/3ML nebulizer solution 3 mL  3 mL Nebulization Q4H PRN Merwyn Katos, MD      . latanoprost (XALATAN) 0.005 % ophthalmic solution 1 drop  1 drop Both Eyes Q24H Bertram Savin, RPH      . levothyroxine (SYNTHROID, LEVOTHROID) tablet 137 mcg  137 mcg Oral QAC breakfast Bertram Savin, RPH   137 mcg at 10/30/18 0901  . liver oil-zinc oxide (DESITIN) 40 % ointment   Topical Daily Bertram Savin, RPH      . MEDLINE mouth rinse  15 mL Mouth Rinse BID Barbaraann Rondo, MD   15 mL at 10/30/18 0902  . metoprolol succinate (TOPROL-XL) 24 hr tablet 25 mg  25 mg Oral Daily  Bertram Savin, RPH      . OLANZapine (ZYPREXA) injection 5 mg  5 mg Intramuscular Q6H PRN Thalia Party, MD   5 mg at 10/30/18 0555  . potassium chloride (KLOR-CON) packet 40 mEq  40 mEq Oral Once Mansy, Jan A, MD      . promethazine (PHENERGAN) injection 12.5 mg  12.5 mg Intravenous Q6H PRN Barbaraann Rondo, MD      . senna-docusate (Senokot-S) tablet 1 tablet  1 tablet Oral QHS PRN Barbaraann Rondo, MD        Musculoskeletal: Strength & Muscle Tone: decreased Gait & Station: unsteady Patient leans: N/A  Psychiatric Specialty Exam: Physical Exam  Nursing note and vitals reviewed. Constitutional: He appears well-developed and well-nourished.  HENT:  Head: Normocephalic and atraumatic.  Respiratory: Effort normal.  Neurological: He is alert.    ROS  Blood pressure 96/79, pulse 97, temperature 98.5 F (36.9 C), temperature source Oral, resp. rate (!) 24, height 5\' 2"  (1.575 m), weight 111.9 kg, SpO2 94 %.Body mass index is 45.12 kg/m.  General Appearance: Casual  Eye Contact:  Fair  Speech:  Normal Rate  Volume:  Normal  Mood:  Anxious and Depressed  Affect:  Congruent  Thought Process:  Goal Directed and Descriptions of Associations: Circumstantial  Orientation:  Other:  : Oriented to self, month and year.  Thought Content:  Logical  Suicidal Thoughts:  No  Homicidal Thoughts:  No  Memory:  Immediate;   Poor Recent;   Poor Remote;   Poor  Judgement:  Impaired  Insight:  Lacking  Psychomotor Activity:  Normal  Concentration:  Concentration: Fair and Attention Span: Fair  Recall:  Poor  Fund of Knowledge:  Fair  Language:  Fair  Akathisia:  Negative  Handed:  Right  AIMS (if indicated):     Assets:  Desire for Improvement Resilience  ADL's:  Intact  Cognition:  Impaired,  Moderate  Sleep:        Treatment Plan Summary: Daily contact with patient to assess and evaluate symptoms and progress in treatment, Medication management and Plan : Patient is seen  and examined.  Patient is a 73 year old male with the above-stated past psychiatric history seen in follow-up.  Patient does endorse helplessness, hopelessness and worthlessness.  He has a longstanding history of anxiety.  At different times  in last month or 2 he is refused the diazepam, and clearly has been delirious secondary to that.  I asked him about his most recent hospitalization in [facility] this [facility] at [facility], and he does not recall that admission.  Clearly there are components of cognitive dysfunction.  A CT scan of the head has not been obtained at our facility.  Given his irritability I think we can hold on that at least for now.  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  Clearly the benzodiazepines would worsen that with frontal lobe disinhibition.  He is disoriented, and again this could be a component of delirium as well.  No change in the current recommendations with regard to Zyprexa.  It might be beneficial to put him on a benzodiazepine taper, but given the fact that he probably has not taken it well over the last several weeks to months I think leaving it alone and a PRN at this point is fine.  Given his heart failure I think sertraline is probably the best choice.  Will start him on 25 mg p.o. daily and titrate during the course the hospitalization.  Clearly his decreased TSH from his hypothyroidism and noncompliance with his thyroid medication could clearly contribute to his depressive symptoms. 1.  continue Zyprexa 5 mg as needed as previously ordered. 2.  Obtain CT scan of the head without contrast for dementia work-up. 3.  Continue diazepam 10 mg p.o. 3 times daily as needed anxiety or agitation. 4.  Sertraline 25 mg p.o. daily for anxiety and depression. 5.  Continue Synthroid at 137 mcg p.o. daily for hypothyroidism. 6.  Will continue to follow with you.  Disposition: Patient does not meet criteria for psychiatric inpatient  admission.  Antonieta PertGreg Lawson Henok Heacock, MD 10/31/2018 9:42 AM

## 2018-10-31 NOTE — TOC Initial Note (Signed)
Transition of Care [facility]) - Initial/Assessment Note    Patient Details  Name: Eric [name] MRN: [id number] Date of Birth: [birth year]  [facility] CM/SW Contact:    York Spaniel, LCSW Phone Number: 10/31/2018, 2:42 PM  Clinical Narrative:        CSW aware that patient is from [facility]. CSW spoke with Amalia Hailey at [facility] and he stated that patient can return at discharge. Amalia Hailey stated that patient has oxygen but could benefit from a wheelchair. CSW inquired if patient was every a behavioral issue at their facility and Amalia Hailey stated that patient tends to be very grumpy.   Patient could not be interviewed today as patient was being verbally aggressive towards staff and refusing to take his medications.           Expected Discharge Plan: Assisted Living Barriers to Discharge: No Barriers Identified   Patient Goals and CMS Choice        Expected Discharge Plan and Services Expected Discharge Plan: Assisted Living       Living arrangements for the past 2 months: Assisted Living Facility                          Prior Living Arrangements/Services Living arrangements for the past 2 months: Assisted Living Facility Lives with:: Facility Resident Patient language and need for interpreter reviewed:: Yes Do you feel safe going back to the place where you live?: Yes      Need for Family Participation in Patient Care: Yes (Comment) Care giver support system in place?: Yes (comment)   Criminal Activity/Legal Involvement Pertinent to Current Situation/Hospitalization: No - Comment as needed  Activities of Daily Living Home Assistive Devices/Equipment: Wheelchair ADL Screening (condition at time of admission) Patient's cognitive ability adequate to safely complete daily activities?: Yes Is the patient deaf or have difficulty hearing?: No Does the patient have difficulty seeing, even when wearing glasses/contacts?: No Does the patient have difficulty concentrating,  remembering, or making decisions?: No Patient able to express need for assistance with ADLs?: Yes Does the patient have difficulty dressing or bathing?: No Independently performs ADLs?: Yes (appropriate for developmental age) Does the patient have difficulty walking or climbing stairs?: Yes Weakness of Legs: Both Weakness of Arms/Hands: None  Permission Sought/Granted                  Emotional Assessment Appearance:: Appears stated age Attitude/Demeanor/Rapport: Angry, Hostile   Orientation: : Oriented to Self, Oriented to Place Alcohol / Substance Use: Not Applicable Psych Involvement: No (comment)  Admission diagnosis:  Cellulitis of groin [L03.314] Acute respiratory distress [R06.03] Anasarca [R60.1] Cellulitis [L03.90] Suspected Covid-19 Virus Infection [R68.89] Patient Active Problem List   Diagnosis Date Noted  . Acute hypoxemic respiratory failure (HCC) 10/29/2018  . Suspected Covid-19 Virus Infection 10/29/2018  . Delirium    PCP:  Dortha Kern, MD Pharmacy:   [facility] [phone] [place], [place] - [address] AT [employer] [address] [place] [address] [place] [address] Phone: [phone] Fax: [phone]     Social Determinants of Health (SDOH) Interventions    Readmission Risk Interventions Readmission Risk Prevention Plan 10/31/2018  Transportation Screening Complete  Home Care Screening Complete  Some recent data might be hidden

## 2018-10-31 NOTE — Progress Notes (Signed)
Pt was able to get metoprolol IV. HR is is better. It is in 90s now.

## 2018-10-31 NOTE — Progress Notes (Signed)
Pt refused to have tele box on. MD was made aware and dc order.

## 2018-11-01 DIAGNOSIS — F19230 Other psychoactive substance dependence with withdrawal, uncomplicated: Secondary | ICD-10-CM

## 2018-11-01 DIAGNOSIS — F05 Delirium due to known physiological condition: Secondary | ICD-10-CM

## 2018-11-01 DIAGNOSIS — F339 Major depressive disorder, recurrent, unspecified: Secondary | ICD-10-CM

## 2018-11-01 DIAGNOSIS — F411 Generalized anxiety disorder: Secondary | ICD-10-CM

## 2018-11-01 MED ORDER — CEPHALEXIN 500 MG PO CAPS
500.0000 mg | ORAL_CAPSULE | Freq: Three times a day (TID) | ORAL | Status: DC
  Administered 2018-11-01 – 2018-11-03 (×4): 500 mg via ORAL
  Filled 2018-11-01 (×5): qty 1

## 2018-11-01 MED ORDER — SODIUM CHLORIDE 0.9 % IV SOLN
INTRAVENOUS | Status: DC | PRN
  Administered 2018-11-01: 01:00:00 250 mL via INTRAVENOUS

## 2018-11-01 NOTE — Progress Notes (Signed)
[employer] [facility] [facility] [place] [facility]   PATIENT NAME: Eric [name]    MR#:  [id number]  DATE OF BIRTH:  [birth year]  SUBJECTIVE:   Resting comfortably.  Sitter at bedside.  REVIEW OF SYSTEMS:  Review of Systems  Constitutional: Negative for chills and fever.  HENT: Negative for congestion and sore throat.   Eyes: Negative for blurred vision and double vision.  Respiratory: Positive for shortness of breath. Negative for cough.   Cardiovascular: Negative for chest pain and leg swelling.  Gastrointestinal: Negative for nausea and vomiting.  Genitourinary: Negative for dysuria and urgency.  Musculoskeletal: Negative for back pain and neck pain.  Neurological: Negative for dizziness and headaches.  Psychiatric/Behavioral: Negative for depression. The patient is not nervous/anxious.     DRUG ALLERGIES:   Allergies  Allergen Reactions  . Demerol [Meperidine] Other (See Comments)    Will counteract with a drug he is taking causing fatal reaction with nardil  . Epinephrine Other (See Comments)    Nardil reaction-With epi?  . Nardil [Phenelzine] Other (See Comments)  . Morphine And Related Other (See Comments)    Reacts with nardil  . Ondansetron     Made patient want to climb the walls  . Shellfish Allergy Hives   VITALS:  Blood pressure 119/90, pulse 86, temperature 98.4 F (36.9 C), temperature source Oral, resp. rate 20, height 5\' 2"  (1.575 m), weight 111.9 kg, SpO2 93 %. PHYSICAL EXAMINATION:  Physical Exam  GENERAL:   Sitting up on the edge of the bed, in no acute distress HEENT: Head atraumatic, normocephalic. Pupils equal, round, reactive to light and accommodation. No scleral icterus. Extraocular muscles intact. Oropharynx and nasopharynx clear.  NECK:  Supple, no jugular venous distention. No thyroid enlargement. LUNGS:  Diminished breath sounds throughout all lung fields bilaterally, no wheezing, crackles, or rhonchi.  Nasal cannula in place.  Able to  speak in full sentences. CARDIOVASCULAR: RRR, no murmurs, rubs, gallops ABDOMEN: Soft, nontender, nondistended. Bowel sounds present.  EXTREMITIES: No pedal edema, cyanosis, or clubbing.  NEUROLOGIC: CN 2-12 intact,moving all extremities, no obvious focal deficits  PSYCHIATRIC: The patient is alert and oriented x 3, currently calm SKIN: No obvious rash, lesion, or ulcer.  LABORATORY PANEL:  Male CBC Recent Labs  Lab 10/31/18 0529  WBC 11.0*  HGB 13.2  HCT 41.3  PLT 220   ------------------------------------------------------------------------------------------------------------------ Chemistries  Recent Labs  Lab 10/28/18 2250  10/31/18 0529  NA 135   < > 139  K 4.3   < > 3.8  CL 99   < > 101  CO2 29   < > 32  GLUCOSE 153*   < > 109*  BUN 15   < > 23  CREATININE 1.00   < > 1.01  CALCIUM 8.2*   < > 7.9*  MG 2.0   < > 2.0  AST 16  --   --   ALT 13  --   --   ALKPHOS 69  --   --   BILITOT 1.1  --   --    < > = values in this interval not displayed.   RADIOLOGY:  No results found. ASSESSMENT AND PLAN:   Acute hypoxic respiratory failure secondary to COPD exacerbation-patient currently on 3 L O2. Patient thinks he may use 2 L O2 at night, but he is unsure. -COVID testing is negative -DuoNebs PRN -Wean O2 as able  Scrotal cellulitis-no signs of sepsis -Scrotal US with extensive edema of the  scrotal wall - Change Ancef to keflex -Blood cultures with no growth to date  Sinus tachycardia- likely related to agitation. -Metoprolol ordered, but patient is refusing times Improving  Aggressive behavior- patient does have a history of dementia -Unable to tolerate Precedex drip due to bradycardia -Secondary school teacherne-to-one sitter -Psych consult- recommend Hiprex at 5 mg as needed agitation, Valium 10 mg 3 times daily as needed, Zoloft -CT head ordered per psych recommendations- unclear if patient will tolerate this  Chronic systolic congestive heart failure-last echo 07/26/2018 with EF  30 to 35% -Continue Lasix and metoprolol- patient is refusing both of these medications  Hypothyroidism-stable -Continue home Synthroid  All the records are reviewed and case discussed with Care Management/Social Worker. Management plans discussed with the patient, family and they are in agreement.  CODE STATUS: Full Code  TOTAL TIME TAKING CARE OF THIS PATIENT: 35 minutes.   POSSIBLE D/C IN 1-2 DAYS, DEPENDING ON CLINICAL CONDITION.   Orie FishermanSrikar R Lukus Binion M.D on 11/01/2018 at 2:34 PM  Between 7am to 6pm - Pager - [phone]  After 6pm go to [email or web address] - Social research officer, governmentpassword EPAS [facility]  [facility] [place] [facility]  Office  [phone]  CC: Primary care physician; Dortha KernBliss, Laura K, MD  Note: This dictation was prepared with Dragon dictation along with smaller phrase technology. Any transcriptional errors that result from this process are unintentional.

## 2018-11-01 NOTE — Consult Note (Signed)
[facility] Psych ED Progress Note  11/01/2018 11:04 AM Eric [name]  MRN:  [id number] Subjective: Patient is a 73 year old male with a past psychiatric history of multiple medical problems and as well generalized anxiety, depression, and recent delirium most likely secondary to benzodiazepine withdrawal.  He is seen in follow-up.  Objective : Patient is seen and examined.  Patient is a 73 year old male with the above-stated past psychiatric history is seen in follow-up.  The chart was reviewed.  The patient has continued to refuse treatment.  He refused medications last night.  He refused the a cardiac monitor.  We had a frank discussion today about that.  He is oriented x3.  I do believe he has a mild degree of dementia, and probably needs to have a MRI of his brain when he would be agreeable to that.  We discussed the fact of his increased mortality by not taking any of his medications for his COPD, CHF, atrial fibrillation etc.  He told me yesterday that he wants to live, and he reiterated that today.  I explained to him that I felt as though without adequate treatment he is medical issues would lead to a premature demise.  He stated that he is willing to attempt taking the pills.  He said that yesterday, so not really sure how much affect this conversation had.  He was started on Zoloft 25 mg p.o. daily, and as well because of his agitation and irritability towards staff we increased his Zyprexa to 10 mg IM twice daily standing versus PRN.  He is oxygen saturations 93% on 3 L.  Temperature is 98.4, pulse is 88, respirations are 20 and his blood pressure is 119/90.  No new laboratories, but clearly his hypothyroidism is significantly severe at this point given his elevated TSH.  Principal Problem: <principal problem not specified> Diagnosis:  Active Problems:   Acute hypoxemic respiratory failure (HCC)   Suspected Covid-19 Virus Infection   Delirium  Total Time spent with patient: 15 minutes  Past  Psychiatric History: See admission H&P  Past Medical History:  Past Medical History:  Diagnosis Date  . Anxiety and depression   . Cataract   . Gallstones   . Glaucoma   . Hypertension   . Hypoglycemia   . Inguinal hernia   . Sleep apnea   . Thyroid disease     Past Surgical History:  Procedure Laterality Date  . FINGER AMPUTATION Right    Family History:  Family History  Problem Relation Age of Onset  . Alzheimer's disease Mother    Family Psychiatric  History: See admission H&P Social History:  Social History   Substance and Sexual Activity  Alcohol Use No     Social History   Substance and Sexual Activity  Drug Use No    Social History   Socioeconomic History  . Marital status: Divorced    Spouse name: Not on file  . Number of children: 3  . Years of education: Not on file  . Highest education level: Not on file  Occupational History  . Not on file  Social Needs  . Financial resource strain: Not on file  . Food insecurity:    Worry: Not on file    Inability: Not on file  . Transportation needs:    Medical: Not on file    Non-medical: Not on file  Tobacco Use  . Smoking status: Former Smoker    Last attempt to quit: 10/18/2010    Years since  quitting: 8.0  . Smokeless tobacco: Never Used  Substance and Sexual Activity  . Alcohol use: No  . Drug use: No  . Sexual activity: Not on file  Lifestyle  . Physical activity:    Days per week: Not on file    Minutes per session: Not on file  . Stress: Not on file  Relationships  . Social connections:    Talks on phone: Not on file    Gets together: Not on file    Attends religious service: Not on file    Active member of club or organization: Not on file    Attends meetings of clubs or organizations: Not on file    Relationship status: Not on file  Other Topics Concern  . Not on file  Social History Narrative  . Not on file    Sleep: Fair  Appetite:  Fair  Current Medications: Current  Facility-Administered Medications  Medication Dose Route Frequency Provider Last Rate Last Dose  . 0.9 %  sodium chloride infusion   Intravenous PRN Barbaraann RondoSridharan, Prasanna, MD   Stopped at 11/01/18 0149  . acetaminophen (TYLENOL) tablet 650 mg  650 mg Oral Q6H PRN Barbaraann RondoSridharan, Prasanna, MD       Or  . acetaminophen (TYLENOL) suppository 650 mg  650 mg Rectal Q6H PRN Barbaraann RondoSridharan, Prasanna, MD      . bisacodyl (DULCOLAX) EC tablet 5 mg  5 mg Oral Daily PRN Barbaraann RondoSridharan, Prasanna, MD      . ceFAZolin (ANCEF) IVPB 1 g/50 mL premix  1 g Intravenous Q8H Barbaraann RondoSridharan, Prasanna, MD   Stopped at 11/01/18 0137  . diazepam (VALIUM) tablet 10 mg  10 mg Oral Q6H PRN Mayo, Allyn KennerKaty Dodd, MD   10 mg at 11/01/18 1024  . enoxaparin (LOVENOX) injection 40 mg  40 mg Subcutaneous Q12H Albina BilletShanlever, Charles M, RPH   40 mg at 11/01/18 [id number]  . furosemide (LASIX) tablet 20 mg  20 mg Oral Daily Bertram SavinSimpson, Michael L, RPH   20 mg at 11/01/18 0949  . ipratropium-albuterol (DUONEB) 0.5-2.5 (3) MG/3ML nebulizer solution 3 mL  3 mL Nebulization Q4H PRN Merwyn KatosSimonds, David B, MD      . latanoprost (XALATAN) 0.005 % ophthalmic solution 1 drop  1 drop Both Eyes Q24H Bertram SavinSimpson, Michael L, RPH      . levothyroxine (SYNTHROID, LEVOTHROID) tablet 137 mcg  137 mcg Oral QAC breakfast Bertram SavinSimpson, Michael L, RPH   137 mcg at 11/01/18 0950  . liver oil-zinc oxide (DESITIN) 40 % ointment   Topical Daily Bertram SavinSimpson, Michael L, RPH   1 application at 11/01/18 0950  . MEDLINE mouth rinse  15 mL Mouth Rinse BID Barbaraann RondoSridharan, Prasanna, MD   15 mL at 11/01/18 0950  . metoprolol succinate (TOPROL-XL) 24 hr tablet 25 mg  25 mg Oral Daily Bertram SavinSimpson, Michael L, RPH   25 mg at 11/01/18 0949  . metoprolol tartrate (LOPRESSOR) injection 5 mg  5 mg Intravenous Q4H PRN Mayo, Allyn KennerKaty Dodd, MD   5 mg at 10/31/18 1125  . OLANZapine (ZYPREXA) injection 10 mg  10 mg Intramuscular BID Antonieta Pertlary, Maximilien Hayashi Lawson, MD   10 mg at 11/01/18 0951  . potassium chloride (KLOR-CON) packet 40 mEq  40 mEq Oral Once Mansy,  Jan A, MD      . promethazine (PHENERGAN) injection 12.5 mg  12.5 mg Intravenous Q6H PRN Barbaraann RondoSridharan, Prasanna, MD      . senna-docusate (Senokot-S) tablet 1 tablet  1 tablet Oral QHS PRN Barbaraann RondoSridharan, Prasanna, MD      .  sertraline (ZOLOFT) tablet 25 mg  25 mg Oral Daily Antonieta Pert, MD   25 mg at 11/01/18 4536    Lab Results:  Results for orders placed or performed during the hospital encounter of 10/28/18 (from the past 48 hour(s))  CBC     Status: Abnormal   Collection Time: 10/31/18  5:29 AM  Result Value Ref Range   WBC 11.0 (H) 4.0 - 10.5 K/uL   RBC 4.33 4.22 - 5.81 MIL/uL   Hemoglobin 13.2 13.0 - 17.0 g/dL   HCT 46.8 03.2 - 12.2 %   MCV 95.4 80.0 - 100.0 fL   MCH 30.5 26.0 - 34.0 pg   MCHC 32.0 30.0 - 36.0 g/dL   RDW 48.2 50.0 - 37.0 %   Platelets 220 150 - 400 K/uL   nRBC 0.0 0.0 - 0.2 %    Comment: Performed at [facility], [address] [place], [place] [address]  Magnesium     Status: None   Collection Time: 10/31/18  5:29 AM  Result Value Ref Range   Magnesium 2.0 1.7 - 2.4 mg/dL    Comment: Performed at [facility], [address] [place], [place] [address]  Basic metabolic panel     Status: Abnormal   Collection Time: 10/31/18  5:29 AM  Result Value Ref Range   Sodium 139 135 - 145 mmol/L   Potassium 3.8 3.5 - 5.1 mmol/L   Chloride 101 98 - 111 mmol/L   CO2 32 22 - 32 mmol/L   Glucose, Bld 109 (H) 70 - 99 mg/dL   BUN 23 8 - 23 mg/dL   Creatinine, Ser 0.38 0.61 - 1.24 mg/dL   Calcium 7.9 (L) 8.9 - 10.3 mg/dL   GFR calc non Af Amer >60 >60 mL/min   GFR calc Af Amer >60 >60 mL/min   Anion gap 6 5 - 15    Comment: Performed at [facility], [address] [place], [place] [address]    Blood Alcohol level:  Lab Results  Component Value Date   [facility] <11 08/17/2011    Physical Findings: AIMS:  , ,  ,  ,    CIWA:    COWS:     Musculoskeletal: Strength & Muscle Tone: decreased Gait & Station: unsteady Patient leans:  Lying in bed  Psychiatric Specialty Exam: Physical Exam  Nursing note and vitals reviewed. Constitutional: He is oriented to person, place, and time. He appears well-developed and well-nourished.  HENT:  Head: Normocephalic and atraumatic.  Respiratory: Effort normal.  Neurological: He is alert and oriented to person, place, and time.    ROS  Blood pressure 119/90, pulse 86, temperature 98.4 F (36.9 C), temperature source Oral, resp. rate 20, height 5\' 2"  (1.575 m), weight 111.9 kg, SpO2 93 %.Body mass index is 45.12 kg/m.  General Appearance: Casual  Eye Contact:  Fair  Speech:  Normal Rate  Volume:  Decreased  Mood:  Anxious  Affect:  Congruent  Thought Process:  Goal Directed and Descriptions of Associations: Circumstantial  Orientation:  Full (Time, Place, and Person)  Thought Content:  Rumination  Suicidal Thoughts:  No  Homicidal Thoughts:  No  Memory:  Immediate;   Fair Recent;   Fair Remote;   Fair  Judgement:  Impaired  Insight:  Lacking  Psychomotor Activity:  Psychomotor Retardation  Concentration:  Concentration: Fair and Attention Span: Fair  Recall:  [employer] of Knowledge:  Fair  Language:  Fair  Akathisia:  Negative  Handed:  Right  AIMS (if indicated):     Assets:  Desire for Improvement Resilience  ADL's:  Impaired  Cognition:  Impaired,  Mild  Sleep:         Treatment Plan Summary: Daily contact with patient to assess and evaluate symptoms and progress in treatment, Medication management and Plan : Patient is seen and examined.  Patient is a 73 year old male with the above-stated past psychiatric history who is seen in follow-up.  He is essentially unchanged from yesterday.  His Zyprexa was changed to 10 mg IM twice daily because of continued agitation and irritability.  We had a long discussion today about his mortality factor with regard to noncompliance with medications.  I tried to explain to him what his outcomes would be without medications  for his multiple medical problems.  He stated he will attempt to take these medications, but I seriously doubt that will really take place.  If he does, hopefully the resumption of the diazepam as well as the addition of the sertraline will have benefit.  Clearly if he takes his medications his TSH will improve, and his mood would also improve from that.  We will continue to follow with you.  Antonieta Pert, MD 11/01/2018, 11:04 AM

## 2018-11-01 NOTE — Progress Notes (Signed)
Patient with improving mental status and is less agitated than he has been noted to be. Orders obtained from MD to attempt Tele sitter and will dc 1:1 sitter.

## 2018-11-01 NOTE — Plan of Care (Signed)
  Problem: Clinical Measurements: Goal: Ability to maintain clinical measurements within normal limits will improve Outcome: Progressing Goal: Will remain free from infection Outcome: Progressing Goal: Diagnostic test results will improve Outcome: Progressing Goal: Respiratory complications will improve Outcome: Progressing Goal: Cardiovascular complication will be avoided Outcome: Progressing   Problem: Safety: Goal: Ability to remain free from injury will improve Outcome: Progressing   Problem: Coping: Goal: Level of anxiety will decrease Outcome: Progressing  Patient with persistent agitation/anxiety although he is now participating more in his care.

## 2018-11-02 MED ORDER — SERTRALINE HCL 50 MG PO TABS
50.0000 mg | ORAL_TABLET | Freq: Every day | ORAL | Status: DC
  Administered 2018-11-03: 06:00:00 50 mg via ORAL

## 2018-11-02 MED ORDER — OLANZAPINE 10 MG PO TBDP
10.0000 mg | ORAL_TABLET | Freq: Two times a day (BID) | ORAL | Status: DC
  Administered 2018-11-02 – 2018-11-03 (×2): 10 mg via ORAL
  Filled 2018-11-02 (×4): qty 1

## 2018-11-02 NOTE — Consult Note (Signed)
Psychiatric consultation follow-up progress note.  Subjective:  Patient is a 73 year old male with a past psychiatric history of multiple medical problems and as well generalized anxiety, depression, and recent delirium most likely secondary to benzodiazepine withdrawal.  He is seen in follow-up.  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  Patient is a 73 year old male with the above-stated past psychiatric history who is seen in follow-up.  The chart was reviewed.  Patient is essentially unchanged today.  They have DC'd the sitter, and he now has an Production manager in the room with him.  It sounds like they are considering discharge in the next 1 to 2 days.  He is a bit more irritable today, but basically at his baseline.  He stated he did take "some of my medicines".  He continues to reiterate that he does "not like pills".  He has been on injectable Zyprexa, and that did seem to take the edge off him.  I asked him if he would be willing to take a pill that would dissolve underneath his tongue which would also be Zyprexa.  He said he would think about it.  No new laboratories today.  No evidence of psychosis.  He denied any suicidal or homicidal ideation.  His vital signs are stable, he is afebrile.  Respirations were 24, oxygen saturation was 93% on 3 L nasal cannula.  Current medications: Keflex 500 mg p.o. every 8 hours Dulcolax tablets 5 mg p.o. daily PRN Diazepam 10 mg p.o. every 6 hours as needed anxiety Lovenox 40 mg subcu every 12 hours Lasix 20 mg p.o. daily DuoNeb nebulizations every 4 hours PRN Xalatan 0.005% ophthalmic solution 1 drop in each eye q. 24 Levothyroxine 137 mcg p.o. daily Metoprolol XL to 25 mg p.o. daily Zyprexa 10 mg injection twice daily Phenergan 12.5 mg IV every 6 hours as needed nausea Senokot Zoloft 25 mg  p.o. daily  Psychiatric Specialty Exam: Physical Exam  Nursing note and vitals reviewed. Constitutional: He is oriented to person, place, and time. He appears well-developed and well-nourished.  HENT:  Head: Normocephalic and atraumatic.  Respiratory: Effort normal.  Neurological: He is alert and oriented to person, place, and time.    ROS  Blood pressure 119/90, pulse 86, temperature 98.4 F (36.9 C), temperature source Oral, resp. rate 20, height 5\' 2"  (1.575 m), weight 111.9 kg, SpO2 93 %.Body mass index is 45.12 kg/m.  General Appearance: Casual  Eye Contact:  Fair  Speech:  Normal Rate  Volume:  Decreased  Mood:  Anxious  Affect:  Congruent  Thought Process:  Goal Directed and Descriptions of Associations: Circumstantial  Orientation:  Full (Time, Place, and Person)  Thought Content:  Rumination  Suicidal Thoughts:  No  Homicidal Thoughts:  No  Memory:  Immediate;   Fair Recent;   Fair Remote;   Fair  Judgement:  Impaired  Insight:  Lacking  Psychomotor Activity:  Psychomotor Retardation  Concentration:  Concentration: Fair and Attention Span: Fair  Recall:  [employer] of Knowledge:  Fair  Language:  Fair  Akathisia:  Negative  Handed:  Right  AIMS (if indicated):     Assets:  Desire for Improvement Resilience  ADL's:  Impaired  Cognition:  Impaired,  Mild  Sleep:       Problem list and plan: Patient is seen and examined.  Patient is a 73 year old male with the  above-stated past psychiatric history is seen in follow-up.  He is essentially unchanged.  I am going to try and change his Zyprexa from IM to the Zyprexa Zydis to dissolve underneath his tongue.  We will keep the dose at 10 mg twice daily.  As well, I will increase his Zoloft to 50 mg p.o. daily.  No change in his diazepam.

## 2018-11-02 NOTE — Evaluation (Addendum)
Physical Therapy Evaluation Patient Details Name: Eric [name] III MRN: [id number] DOB: [birth year] Today's Date: 11/02/2018   History of Present Illness  Patient presented to ED for lower extremity swelling and shortness of breath on 4/11; patient was admitted and is being treated for cellulitis of the groin and respiratory distress. Patient has PMH significant for COPD, CHF, a-fib, dementia, as well as other psychological needs; recent hospitalization at [facility] for respiratory failure congestive heart failure.  Clinical Impression  Patient found awake and alert in bed with [place] removed and required considerable and repeated encouragement to participate in physical therapy evaluation. Due to patient's cognitive status, he was unable to provide consistent/reliable historical information regarding his PLOF and assistance available. Patient's SpO2 on arrival was 82%, but elevated to 88% once 3L O2 via [place] was reapplied. Patient was able to perform bed mobility with Min A for the final steps during supine to sit with the head of bed elevated (SpO2 88%). Patient expressed fear of falling and distrust of the medical community throughout the session, but was able to be redirected to the task. Patient required Mod A x2 to perform sit to stand, and required max verbal cues for sequencing of activity and safety. Patient able to perform stand pivot transfer to chair with Mod A x1, but demonstrates lack of understanding regarding safe descent to seat surface; SpO2 after transfer was 90% on 3L via [place]. Patient will benefit from skilled therapeutic intervention to address deficits in strength, balance, and mobility in order to decrease risk of falls, improve overall function and QOL.   Patient left in chair with alarm on, call bell and telephone in reach with tray table over patient's legs. All needs met.     Follow Up Recommendations SNF;Supervision/Assistance - 24 hour    Equipment Recommendations        Recommendations for Other Services       Precautions / Restrictions Precautions Precautions: Fall Restrictions Weight Bearing Restrictions: No      Mobility  Bed Mobility Overal bed mobility: Needs Assistance Bed Mobility: Supine to Sit     Supine to sit: Min assist     General bed mobility comments: Patient requires increased time and verbal cueing to stay attentive to the task.  Transfers Overall transfer level: Needs assistance Equipment used: Rolling walker (2 wheeled) Transfers: Sit to/from [facility] Sit to Stand: Mod assist Stand pivot transfers: Mod assist       General transfer comment: Patient limited largely by fear of falling and distrust.   Ambulation/Gait Ambulation/Gait assistance: (Unable to assess 2/2 to patient fatigue and fear.)              Stairs            Wheelchair Mobility    Modified Rankin (Stroke Patients Only)       Balance Overall balance assessment: Needs assistance Sitting-balance support: Feet supported;Single extremity supported Sitting balance-Leahy Scale: Good Sitting balance - Comments: Patient able to weight shift to EOB once in sitting with CGA.  Postural control: Right lateral lean Standing balance support: Bilateral upper extremity supported;During functional activity Standing balance-Leahy Scale: Poor Standing balance comment: Patient requires mod/max cueing to stand erect and engage legs to prevent forward flexion of trunk.                             Pertinent Vitals/Pain Pain Assessment: No/denies pain    Home Living Family/patient expects  to be discharged to:: Assisted living               Home Equipment: Other (comment)(Patient not a reliable historian.) Additional Comments: [place]    Prior Function Level of Independence: Needs assistance         Comments: Patient unable to provide consistent history of PLOF; does note that he had supervision for  showering and states he was unable to walk.     Hand Dominance   Dominant Hand: Right    Extremity/Trunk Assessment   Upper Extremity Assessment Upper Extremity Assessment: Generalized weakness(Patient struggles to follow one step commands consistently.)    Lower Extremity Assessment Lower Extremity Assessment: Generalized weakness       Communication   Communication: No difficulties  Cognition Arousal/Alertness: Awake/alert Behavior During Therapy: WFL for tasks assessed/performed;Anxious;Agitated Overall Cognitive Status: History of cognitive impairments - at baseline                                 General Comments: Patient frequently expresses fear of staff stealing things from him (i.e. television remote, his glasses, etc.).      General Comments      Exercises Other Exercises Other Exercises: sitting balance EOB with SUE support; engaging in dual task; ~5 min Other Exercises: stand pivot transfer, mod A with max verbal cues for sequencing and encouragement   Assessment/Plan    PT Assessment Patient needs continued PT services  PT Problem List Decreased strength;Pain;Cardiopulmonary status limiting activity;Decreased range of motion;Decreased cognition;Decreased activity tolerance;Decreased knowledge of use of DME;Obesity;Decreased safety awareness;Decreased balance;Decreased mobility       PT Treatment Interventions Gait training;Balance training;Therapeutic exercise;DME instruction;Stair training;Neuromuscular re-education;Therapeutic activities;Patient/family education;Functional mobility training    PT Goals (Current goals can be found in the Care Plan section)       Frequency Min 2X/week   Barriers to discharge        Co-evaluation               AM-PAC PT "6 Clicks" Mobility  Outcome Measure Help needed turning from your back to your side while in a flat bed without using bedrails?: A Little Help needed moving from lying on your  back to sitting on the side of a flat bed without using bedrails?: A Lot Help needed moving to and from a bed to a chair (including a wheelchair)?: A Lot Help needed standing up from a chair using your arms (e.g., wheelchair or bedside chair)?: A Lot Help needed to walk in hospital room?: A Lot Help needed climbing 3-5 steps with a railing? : Total 6 Click Score: 12    End of Session Equipment Utilized During Treatment: Gait belt Activity Tolerance: Patient tolerated treatment well;Patient limited by fatigue Patient left: with chair alarm set;in chair;with call bell/phone within reach   PT Visit Diagnosis: Unsteadiness on feet (R26.81);Muscle weakness (generalized) (M62.81);Difficulty in walking, not elsewhere classified (R26.2)    Time: 7672-0947 PT Time Calculation (min) (ACUTE ONLY): 38 min   Charges:   PT Evaluation $PT Eval Low Complexity: 1 Low PT Treatments $Therapeutic Exercise: 8-22 mins        Myles Gip PT, DPT [phone] 11/02/2018, 4:37 PM

## 2018-11-02 NOTE — Progress Notes (Signed)
[facility] [place] at [facility]   PATIENT NAME: Eric KosDaniel Cino    MR#:  [id number]  DATE OF BIRTH:  07-02-1946  SUBJECTIVE:   Resting comfortably.  Telesitter in room No SOB  REVIEW OF SYSTEMS:  Review of Systems  Constitutional: Negative for chills and fever.  HENT: Negative for congestion and sore throat.   Eyes: Negative for blurred vision and double vision.  Respiratory: Positive for shortness of breath. Negative for cough.   Cardiovascular: Negative for chest pain and leg swelling.  Gastrointestinal: Negative for nausea and vomiting.  Genitourinary: Negative for dysuria and urgency.  Musculoskeletal: Negative for back pain and neck pain.  Neurological: Negative for dizziness and headaches.  Psychiatric/Behavioral: Negative for depression. The patient is not nervous/anxious.    DRUG ALLERGIES:   Allergies  Allergen Reactions  . Demerol [Meperidine] Other (See Comments)    Will counteract with a drug he is taking causing fatal reaction with nardil  . Epinephrine Other (See Comments)    Nardil reaction-With epi?  . Nardil [Phenelzine] Other (See Comments)  . Morphine And Related Other (See Comments)    Reacts with nardil  . Ondansetron     Made patient want to climb the walls  . Shellfish Allergy Hives   VITALS:  Blood pressure 131/82, pulse 89, temperature 98 F (36.7 C), temperature source Oral, resp. rate (!) 24, height 5\' 2"  (1.575 m), weight 111.9 kg, SpO2 93 %. PHYSICAL EXAMINATION:  Physical Exam  GENERAL:   Sitting up on the edge of the bed, in no acute distress HEENT: Head atraumatic, normocephalic. Pupils equal, round, reactive to light and accommodation. No scleral icterus. Extraocular muscles intact. Oropharynx and nasopharynx clear.  NECK:  Supple, no jugular venous distention. No thyroid enlargement. LUNGS:  Diminished breath sounds throughout all lung fields bilaterally, no wheezing, crackles, or rhonchi.  Nasal cannula in place.   Able to speak in full sentences. CARDIOVASCULAR: RRR, no murmurs, rubs, gallops ABDOMEN: Soft, nontender, nondistended. Bowel sounds present.  EXTREMITIES: No pedal edema, cyanosis, or clubbing.  NEUROLOGIC: CN 2-12 intact,moving all extremities, no obvious focal deficits  PSYCHIATRIC: The patient is alert and oriented, currently calm SKIN: No obvious rash, lesion, or ulcer.  LABORATORY PANEL:  Male CBC Recent Labs  Lab 10/31/18 0529  WBC 11.0*  HGB 13.2  HCT 41.3  PLT 220   ------------------------------------------------------------------------------------------------------------------ Chemistries  Recent Labs  Lab 10/28/18 2250  10/31/18 0529  NA 135   < > 139  K 4.3   < > 3.8  CL 99   < > 101  CO2 29   < > 32  GLUCOSE 153*   < > 109*  BUN 15   < > 23  CREATININE 1.00   < > 1.01  CALCIUM 8.2*   < > 7.9*  MG 2.0   < > 2.0  AST 16  --   --   ALT 13  --   --   ALKPHOS 69  --   --   BILITOT 1.1  --   --    < > = values in this interval not displayed.   RADIOLOGY:  No results found. ASSESSMENT AND PLAN:   Acute hypoxic respiratory failure secondary to COPD exacerbation-patient currently on 3 L O2. Has O2 at his ALF -COVID testing is negative -DuoNebs PRN -Wean O2 as able  Scrotal cellulitis-no signs of sepsis -Scrotal US with extensive edema of the scrotal wall - Changed Ancef to keflex -Blood cultures  with no growth to date  Sinus tachycardia- likely related to agitation. 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 D/C sitter. -Psych consult- change zyprexa IV to SL today, Valium 10 mg 3 times daily as needed, Zoloft -CT head ordered per psych recommendations- Patient not willing to get this done. I discontinued it  Chronic systolic congestive heart failure-last echo  07/26/2018 with EF 30 to 35% -Continue Lasix and metoprolol- patient is refusing both of these medications  Hypothyroidism-stable -Continue home Synthroid  All the records are reviewed and case discussed with Care Management/Social Worker. Management plans discussed with the patient, family and they are in agreement.  CODE STATUS: Full Code  TOTAL TIME TAKING CARE OF THIS PATIENT: 35 minutes.   D/C tele sitter. Consult PT. D/C tomorrow   Orie Fisherman M.D on 11/02/2018 at 10:53 AM  Between 7am to 6pm - Pager - [phone]  After 6pm go to [email or web address] - Social research officer, government  [facility] [place] [facility]  Office  [phone]  CC: Primary care physician; Dortha Kern, MD  Note: This dictation was prepared with Dragon dictation along with smaller phrase technology. Any transcriptional errors that result from this process are unintentional.

## 2018-11-02 NOTE — TOC Progression Note (Signed)
Transition of Care [facility]) - Progression Note    Patient Details  Name: Eric [name] MRN: [id number] Date of Birth: 07-25-1945  [facility] of [facility]) CM/SW Contact  York Spaniel, [place] Phone Number: 11/02/2018, 11:47 AM  Clinical Narrative:   Physician informed that patient's tele-sitter will be discontinued and discharge back to [facility] tomorrow.     Expected Discharge Plan: Assisted Living Barriers to Discharge: No Barriers Identified  Expected Discharge Plan and Services Expected Discharge Plan: Assisted Living       Living arrangements for the past 2 months: Assisted Living Facility                           Social Determinants of Health (SDOH) Interventions    Readmission Risk Interventions Readmission Risk Prevention Plan 10/31/2018  Transportation Screening Complete  Home Care Screening Complete  Some recent data might be hidden

## 2018-11-03 DIAGNOSIS — F324 Major depressive disorder, single episode, in partial remission: Secondary | ICD-10-CM | POA: Diagnosis present

## 2018-11-03 DIAGNOSIS — F411 Generalized anxiety disorder: Secondary | ICD-10-CM | POA: Diagnosis present

## 2018-11-03 DIAGNOSIS — F3341 Major depressive disorder, recurrent, in partial remission: Secondary | ICD-10-CM

## 2018-11-03 LAB — CULTURE, BLOOD (ROUTINE X 2)
Culture: NO GROWTH
Culture: NO GROWTH
Special Requests: ADEQUATE
Special Requests: ADEQUATE

## 2018-11-03 MED ORDER — ACETAMINOPHEN 325 MG PO TABS: 650.0000 mg | ORAL_TABLET | Freq: Four times a day (QID) | ORAL | Status: DC | PRN

## 2018-11-03 MED ORDER — HYDROCODONE-ACETAMINOPHEN 7.5-325 MG PO TABS: 1.0000 | ORAL_TABLET | Freq: Four times a day (QID) | ORAL | 0 refills | Status: DC | PRN

## 2018-11-03 MED ORDER — ZINC OXIDE 40 % EX OINT: TOPICAL_OINTMENT | Freq: Every day | CUTANEOUS | 0 refills | Status: AC

## 2018-11-03 MED ORDER — CEPHALEXIN 500 MG PO CAPS: 500.0000 mg | ORAL_CAPSULE | Freq: Three times a day (TID) | ORAL | 0 refills | Status: DC

## 2018-11-03 MED ORDER — LEVOTHYROXINE SODIUM 25 MCG PO TABS
25.0000 ug | ORAL_TABLET | Freq: Every day | ORAL | Status: DC
  Filled 2018-11-03: qty 1

## 2018-11-03 MED ORDER — METOPROLOL SUCCINATE ER 25 MG PO TB24: 25.0000 mg | ORAL_TABLET | Freq: Every day | ORAL | 0 refills | Status: DC

## 2018-11-03 MED ORDER — SERTRALINE HCL 50 MG PO TABS: 50.0000 mg | ORAL_TABLET | Freq: Every day | ORAL | 0 refills | Status: DC

## 2018-11-03 MED ORDER — FUROSEMIDE 20 MG PO TABS: 20.0000 mg | ORAL_TABLET | Freq: Every day | ORAL | 0 refills | Status: DC

## 2018-11-03 MED ORDER — OLANZAPINE 10 MG PO TBDP: 10.0000 mg | ORAL_TABLET | Freq: Two times a day (BID) | ORAL | 0 refills | Status: DC

## 2018-11-03 MED ORDER — LEVOTHYROXINE SODIUM 150 MCG PO TABS: 150.0000 ug | ORAL_TABLET | Freq: Every day | ORAL | 11 refills | Status: DC

## 2018-11-03 MED ORDER — DIAZEPAM 10 MG PO TABS: 10.0000 mg | ORAL_TABLET | Freq: Four times a day (QID) | ORAL | 0 refills | Status: DC | PRN

## 2018-11-03 NOTE — Consult Note (Signed)
Psychiatric consultation follow-up progress note.  Patient is a 73 year old male with a past psychiatric history of multiple medical problems and as well generalized anxiety, depression, and recent delirium most likely secondary to benzodiazepine withdrawal.  He is seen in follow-up.  Subjective:  Denies agitation with no suicidal/homicidal ideations, hallucinations.  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  Patient is a 73 year old male with the above-stated past psychiatric history who is seen in follow-up.  The chart was reviewed.  Patient is not irritable today as noted yesterday.  Calm, cooperative.  Denies any issues at this time.  Plan is for him to return to his facility today.  Stable psychiatrically.  Current medications: Keflex 500 mg p.o. every 8 hours Dulcolax tablets 5 mg p.o. daily PRN Diazepam 10 mg p.o. every 6 hours as needed anxiety Lovenox 40 mg subcu every 12 hours Lasix 20 mg p.o. daily DuoNeb nebulizations every 4 hours PRN Xalatan 0.005% ophthalmic solution 1 drop in each eye q. 24 Levothyroxine 137 mcg p.o. daily Metoprolol XL to 25 mg p.o. daily Zyprexa 10 mg injection twice daily Phenergan 12.5 mg IV every 6 hours as needed nausea Senokot Zoloft 25 mg p.o. daily  Psychiatric Specialty Exam: Physical Exam  Nursing note and vitals reviewed. Constitutional: He is oriented to person, place, and time. He appears well-developed and well-nourished.  HENT:  Head: Normocephalic and atraumatic.  Respiratory: Effort normal.  Neurological: He is alert and oriented to person, place, and time.    ROS  Blood pressure 119/90, pulse 86, temperature 98.4 F (36.9 C), temperature source Oral, resp. rate 20, height 5\' 2"  (1.575 m), weight 111.9 kg, SpO2 93 %.Body  mass index is 45.12 kg/m.  General Appearance: Casual  Eye Contact:  Fair  Speech:  Normal Rate  Volume:  Decreased  Mood:  Euthymic  Affect:  Congruent  Thought Process:  Goal Directed and Descriptions of Associations: Circumstantial  Orientation:  Full (Time, Place, and Person)  Thought Content:  Rumination  Suicidal Thoughts:  No  Homicidal Thoughts:  No  Memory:  Immediate;   Fair Recent;   Fair Remote;   Fair  Judgement:  Impaired  Insight:  Lacking  Psychomotor Activity:  Psychomotor Retardation  Concentration:  Concentration: Fair and Attention Span: Fair  Recall:  [employer] of Knowledge:  Fair  Language:  Fair  Akathisia:  Negative  Handed:  Right  AIMS (if indicated):     Assets:  Desire for Improvement Resilience  ADL's:  Impaired  Cognition:  Impaired,  Mild  Sleep:       Problem list and plan: Anxiety: -Valium 10 mg every six hours PRN -Zyprexa 10 mg BID  Depression: -Zoloft 50 mg daily  Nanine Means, PMHNP

## 2018-11-03 NOTE — Discharge Summary (Signed)
[facility] - [place] [facility]   PATIENT NAME: Eric [name]    MR#:  [id number]  DATE OF BIRTH:  1946-03-21  DATE OF ADMISSION:  10/28/2018 ADMITTING PHYSICIAN: Barbaraann RondoPrasanna Sridharan, MD  DATE OF DISCHARGE: 11/03/2018  PRIMARY CARE PHYSICIAN: Dortha KernBliss, Laura K, MD    ADMISSION DIAGNOSIS:  Cellulitis of groin [L03.314] Acute respiratory distress [R06.03] Anasarca [R60.1] Cellulitis [L03.90] Suspected Covid-19 Virus Infection [R68.89]  DISCHARGE DIAGNOSIS:  Active Problems:   Acute hypoxemic respiratory failure (HCC)   Suspected Covid-19 Virus Infection   Delirium   SECONDARY DIAGNOSIS:   Past Medical History:  Diagnosis Date  . Anxiety and depression   . Cataract   . Gallstones   . Glaucoma   . Hypertension   . Hypoglycemia   . Inguinal hernia   . Sleep apnea   . Thyroid disease     HOSPITAL COURSE:   1.  Acute on chronic hypoxic respiratory failure.  COVID testing was negative.  Continue nebulizers.  Patient down to 2 L oxygen which she wears chronically. 2.  Groin cellulitis and scrotal cellulitis.  No signs of sepsis.  Ancef switch over to Keflex.  Will continue for a few more days.  Blood cultures negative. 3.  Sinus tachycardia related to agitation.  Patient on Toprol-XL. 4.  Aggressive behavior, history of dementia.  Major depression and anxiety.  Patient states that he feels more comfortable at the [facility].  He states that they take care of him well there.  Psychiatry added Zyprexa and Zoloft. 5.  Chronic diastolic congestive heart failure.  Patient on low-dose Lasix Toprol-XL.  Last echocardiogram showed an EF of 50%. 6.  Hypothyroidism.  Increased dose of levothyroxine since TSH was slightly elevated.  Recommend checking a TSH in 6 weeks.  DISCHARGE CONDITIONS:   Satisfactory  CONSULTS OBTAINED:  Treatment Team:  Barbaraann RondoSridharan, Prasanna, MD Thalia PartyPaliy, Alisa, MD  DRUG ALLERGIES:   Allergies  Allergen Reactions  . Demerol [Meperidine] Other (See  Comments)    Will counteract with a drug he is taking causing fatal reaction with nardil  . Epinephrine Other (See Comments)    Nardil reaction-With epi?  . Nardil [Phenelzine] Other (See Comments)  . Morphine And Related Other (See Comments)    Reacts with nardil  . Ondansetron     Made patient want to climb the walls  . Shellfish Allergy Hives    DISCHARGE MEDICATIONS:   Allergies as of 11/03/2018      Reactions   Demerol [meperidine] Other (See Comments)   Will counteract with a drug he is taking causing fatal reaction with nardil   Epinephrine Other (See Comments)   Nardil reaction-With epi?   Nardil [phenelzine] Other (See Comments)   Morphine And Related Other (See Comments)   Reacts with nardil   Ondansetron    Made patient want to climb the walls   Shellfish Allergy Hives      Medication List    STOP taking these medications   amLODipine 10 MG tablet Commonly known as:  NORVASC   dimenhyDRINATE 50 MG tablet Commonly known as:  DRAMAMINE     TAKE these medications   acetaminophen 325 MG tablet Commonly known as:  TYLENOL Take 2 tablets (650 mg total) by mouth every 6 (six) hours as needed for mild pain (or Fever >/= 101).   albuterol 0.63 MG/3ML nebulizer solution Commonly known as:  ACCUNEB Take 1 ampule by nebulization every 6 (six) hours as needed for shortness of breath.  bimatoprost 0.01 % Soln Commonly known as:  LUMIGAN Place 1 drop into both eyes at bedtime.   cephALEXin 500 MG capsule Commonly known as:  KEFLEX Take 1 capsule (500 mg total) by mouth every 8 (eight) hours.   diazepam 10 MG tablet Commonly known as:  VALIUM Take 1 tablet (10 mg total) by mouth every 6 (six) hours as needed for anxiety.   furosemide 20 MG tablet Commonly known as:  LASIX Take 1 tablet (20 mg total) by mouth daily. Start taking on:  November 04, 2018 What changed:    medication strength  how much to take  when to take this   HYDROcodone-acetaminophen  7.5-325 MG tablet Commonly known as:  NORCO Take 1 tablet by mouth every 6 (six) hours as needed for moderate pain or severe pain. For pain   levothyroxine 150 MCG tablet Commonly known as:  Synthroid Take 1 tablet (150 mcg total) by mouth daily. What changed:    medication strength  how much to take  when to take this   liver oil-zinc oxide 40 % ointment Commonly known as:  DESITIN Apply topically daily. Start taking on:  November 04, 2018   Melatonin 3 MG Tabs Take 1 tablet by mouth daily.   metoprolol succinate 25 MG 24 hr tablet Commonly known as:  TOPROL-XL Take 1 tablet (25 mg total) by mouth daily. Start taking on:  November 04, 2018 What changed:    medication strength  how much to take  additional instructions   OLANZapine zydis 10 MG disintegrating tablet Commonly known as:  ZYPREXA Take 1 tablet (10 mg total) by mouth 2 (two) times daily.   Senexon-S 8.6-50 MG tablet Generic drug:  senna-docusate Take 2 tablets by mouth 2 (two) times daily.   sertraline 50 MG tablet Commonly known as:  ZOLOFT Take 1 tablet (50 mg total) by mouth daily.   vitamin B-12 500 MCG tablet Commonly known as:  CYANOCOBALAMIN Take 500 mcg by mouth 3 (three) times a week.   Vitamin D (Ergocalciferol) 1.25 MG (50000 UT) Caps capsule Commonly known as:  DRISDOL Take 50,000 Units by mouth every 7 (seven) days.            Durable Medical Equipment  (From admission, onward)         Start     Ordered   11/03/18 1033  For home use only DME lightweight manual wheelchair with seat cushion  Once    Comments:  Patient suffers from major depression and anxiety and weakness which impairs his ability to perform daily activities like walking in the home.  A walker will not resolve  issue with performing activities of daily living. A wheelchair will allow patient to safely perform daily activities. Patient is not able to propel themselves in the home using a standard weight wheelchair  due to weakness. Patient can self propel in the lightweight wheelchair.  Accessories: elevating leg rests (ELRs), wheel locks, extensions and anti-tippers.   11/03/18 1034           DISCHARGE INSTRUCTIONS:   Follow-up with PMD 5 days Follow-up physical therapy at facility  If you experience worsening of your admission symptoms, develop shortness of breath, life threatening emergency, suicidal or homicidal thoughts you must seek medical attention immediately by calling 911 or calling your MD immediately  if symptoms less severe.  You Must read complete instructions/literature along with all the possible adverse reactions/side effects for all the Medicines you take and that have been  prescribed to you. Take any new Medicines after you have completely understood and accept all the possible adverse reactions/side effects.   Please note  You were cared for by a hospitalist during your hospital stay. If you have any questions about your discharge medications or the care you received while you were in the hospital after you are discharged, you can call the unit and asked to speak with the hospitalist on call if the hospitalist that took care of you is not available. Once you are discharged, your primary care physician will handle any further medical issues. Please note that NO REFILLS for any discharge medications will be authorized once you are discharged, as it is imperative that you return to your primary care physician (or establish a relationship with a primary care physician if you do not have one) for your aftercare needs so that they can reassess your need for medications and monitor your lab values.    Today   CHIEF COMPLAINT:   Chief Complaint  Patient presents with  . Shortness of Breath    HISTORY OF PRESENT ILLNESS:  Eric [name]  is a 73 y.o. male came in with shortness of breath   VITAL SIGNS:  Blood pressure 125/86, pulse 78, temperature 98.5 F (36.9 C), temperature  source Oral, resp. rate 20, height 5\' 2"  (1.575 m), weight 111.9 kg, SpO2 94 %.    PHYSICAL EXAMINATION:  GENERAL:  72 y.o.-year-old patient lying in the bed with no acute distress.  EYES: Pupils equal, round, reactive to light and accommodation. No scleral icterus. Extraocular muscles intact.  HEENT: Head atraumatic, normocephalic. Oropharynx and nasopharynx clear.  NECK:  Supple, no jugular venous distention. No thyroid enlargement, no tenderness.  LUNGS: Normal breath sounds bilaterally, no wheezing, rales,rhonchi or crepitation. No use of accessory muscles of respiration.  CARDIOVASCULAR: S1, S2 normal. No murmurs, rubs, or gallops.  ABDOMEN: Soft, non-tender, non-distended. Bowel sounds present. No organomegaly or mass.  EXTREMITIES: 2+ pedal edema.no cyanosis, or clubbing.  NEUROLOGIC: Cranial nerves II through XII are intact. Muscle strength 5/5 in all extremities. Sensation intact. Gait not checked.  PSYCHIATRIC: The patient is alert and answers questions.  SKIN: Erythema groin and scrotum less than previous  DATA REVIEW:   CBC Recent Labs  Lab 10/31/18 0529  WBC 11.0*  HGB 13.2  HCT 41.3  PLT 220    Chemistries  Recent Labs  Lab 10/28/18 2250  10/31/18 0529  NA 135   < > 139  K 4.3   < > 3.8  CL 99   < > 101  CO2 29   < > 32  GLUCOSE 153*   < > 109*  BUN 15   < > 23  CREATININE 1.00   < > 1.01  CALCIUM 8.2*   < > 7.9*  MG 2.0   < > 2.0  AST 16  --   --   ALT 13  --   --   ALKPHOS 69  --   --   BILITOT 1.1  --   --    < > = values in this interval not displayed.    Cardiac Enzymes Recent Labs  Lab 10/28/18 2250  TROPONINI <0.03    Microbiology Results  Results for orders placed or performed during the hospital encounter of 10/28/18  Blood Culture (routine x 2)     Status: None   Collection Time: 10/28/18 11:51 PM  Result Value Ref Range Status   Specimen Description BLOOD LEFT ANTECUBITAL  Final   Special Requests   Final    BOTTLES DRAWN  AEROBIC AND ANAEROBIC Blood Culture adequate volume   Culture   Final    NO GROWTH 5 DAYS Performed at [facility], [address] [place], [place] [address]    Report Status 11/03/2018 FINAL  Final  Respiratory Panel by PCR     Status: None   Collection Time: 10/28/18 11:51 PM  Result Value Ref Range Status   Adenovirus NOT DETECTED NOT DETECTED Final   Coronavirus 229E NOT DETECTED NOT DETECTED Final    Comment: (NOTE) The Coronavirus on the Respiratory Panel, DOES NOT test for the novel  Coronavirus (2019 nCoV)    Coronavirus HKU1 NOT DETECTED NOT DETECTED Final   Coronavirus NL63 NOT DETECTED NOT DETECTED Final   Coronavirus OC43 NOT DETECTED NOT DETECTED Final   Metapneumovirus NOT DETECTED NOT DETECTED Final   Rhinovirus / Enterovirus NOT DETECTED NOT DETECTED Final   Influenza A NOT DETECTED NOT DETECTED Final   Influenza B NOT DETECTED NOT DETECTED Final   Parainfluenza Virus 1 NOT DETECTED NOT DETECTED Final   Parainfluenza Virus 2 NOT DETECTED NOT DETECTED Final   Parainfluenza Virus 3 NOT DETECTED NOT DETECTED Final   Parainfluenza Virus 4 NOT DETECTED NOT DETECTED Final   Respiratory Syncytial Virus NOT DETECTED NOT DETECTED Final   Bordetella pertussis NOT DETECTED NOT DETECTED Final   Chlamydophila pneumoniae NOT DETECTED NOT DETECTED Final   Mycoplasma pneumoniae NOT DETECTED NOT DETECTED Final    Comment: Performed at [facility], [address], [place], [place] [address]  SARS Coronavirus 2 [facility] order, Performed in [facility] hospital lab)     Status: None   Collection Time: 10/28/18 11:51 PM  Result Value Ref Range Status   SARS Coronavirus 2 NEGATIVE NEGATIVE Final    Comment: (NOTE) If result is NEGATIVE SARS-CoV-2 target nucleic acids are NOT DETECTED. The SARS-CoV-2 RNA is generally detectable in upper and lower  respiratory specimens during the acute phase of infection. The lowest  concentration of SARS-CoV-2 viral copies this  assay can detect is 250  copies / mL. A negative result does not preclude SARS-CoV-2 infection  and should not be used as the sole basis for treatment or other  patient management decisions.  A negative result may occur with  improper specimen collection / handling, submission of specimen other  than nasopharyngeal swab, presence of viral mutation(s) within the  areas targeted by this assay, and inadequate number of viral copies  (<250 copies / mL). A negative result must be combined with clinical  observations, patient history, and epidemiological information. If result is POSITIVE SARS-CoV-2 target nucleic acids are DETECTED. The SARS-CoV-2 RNA is generally detectable in upper and lower  respiratory specimens dur ing the acute phase of infection.  Positive  results are indicative of active infection with SARS-CoV-2.  Clinical  correlation with patient history and other diagnostic information is  necessary to determine patient infection status.  Positive results do  not rule out bacterial infection or co-infection with other viruses. If result is PRESUMPTIVE POSTIVE SARS-CoV-2 nucleic acids MAY BE PRESENT.   A presumptive positive result was obtained on the submitted specimen  and confirmed on repeat testing.  While 2019 novel coronavirus  (SARS-CoV-2) nucleic acids may be present in the submitted sample  additional confirmatory testing may be necessary for epidemiological  and / or clinical management purposes  to differentiate between  SARS-CoV-2 and other Sarbecovirus currently known  to infect humans.  If clinically indicated additional testing with an alternate test  methodology [phone]) is advised. The SARS-CoV-2 RNA is generally  detectable in upper and lower respiratory sp ecimens during the acute  phase of infection. The expected result is Negative. Fact Sheet for Patients:  BoilerBrush.com.cy Fact Sheet for Healthcare  Providers: https://pope.com/ This test is not yet approved or cleared by the [place] FDA and has been authorized for detection and/or diagnosis of SARS-CoV-2 by FDA under an Emergency Use Authorization (EUA).  This EUA will remain in effect (meaning this test can be used) for the duration of the COVID-19 declaration under Section 564(b)(1) of the Act, 21 U.S.C. section 360bbb-3(b)(1), unless the authorization is terminated or revoked sooner. Performed at [facility], [address], [place], [place] [address]   Blood Culture (routine x 2)     Status: None   Collection Time: 10/29/18  1:07 AM  Result Value Ref Range Status   Specimen Description BLOOD RIGHT ANTECUBITAL  Final   Special Requests   Final    BOTTLES DRAWN AEROBIC AND ANAEROBIC Blood Culture adequate volume   Culture   Final    NO GROWTH 5 DAYS Performed at [facility], [address] [place], [place] [address]    Report Status 11/03/2018 FINAL  Final  MRSA PCR Screening     Status: None   Collection Time: 10/29/18  8:13 AM  Result Value Ref Range Status   MRSA by PCR NEGATIVE NEGATIVE Final    Comment:        The GeneXpert MRSA Assay (FDA approved for NASAL specimens only), is one component of a comprehensive MRSA colonization surveillance program. It is not intended to diagnose MRSA infection nor to guide or monitor treatment for MRSA infections. Performed at [facility], [address] [place], [place] [address]        Management plans discussed with the patient, and he is in agreement.  Tried to call son on the phone but phone number did not let me leave a message.  CODE STATUS:     Code Status Orders  (From admission, onward)         Start     Ordered   10/29/18 0212  Full code  Continuous     10/29/18 0211        Code Status History    This patient has a current code status but no historical code status.      TOTAL TIME  TAKING CARE OF THIS PATIENT: 35 minutes.    Alford Highland M.D on 11/03/2018 at 10:39 AM  Between 7am to 6pm - Pager - [phone]  After 6pm go to [email or web address] - password [email or web address]  [employer] Office  [phone]  CC: Primary care physician; Dortha Kern, MD

## 2018-11-03 NOTE — Clinical Social Work Note (Signed)
Physician discharging patient back to [facility]. PT was recommending SNF. CSW contacted [facility] and spoke with their Administrator, Amalia Hailey, and informed him of what PT recommended. Amalia Hailey stated that he has been wheelchair bound for a long time and that they would take him back. CSW and physician attempted to reach patient's son unsuccessfully. [facility] is requesting patient return via EMS. York Spaniel MSW,LcSW [phone]

## 2018-11-03 NOTE — Evaluation (Signed)
Physical Therapy Evaluation Patient Details Name: Eric [name] MRN: [id number] DOB: 1946-03-13 Today's Date: 11/03/2018   History of Present Illness  Patient presented to ED for lower extremity swelling and shortness of breath on 4/11; patient was admitted and is being treated for cellulitis of the groin and respiratory distress. Patient has PMH significant for COPD, CHF, a-fib, dementia, as well as other psychological needs; recent hospitalization at [facility] for respiratory failure congestive heart failure.  Clinical Impression   Pt did well with PT today showing more confidence with standing acts, though still very hesitant and anxious about falling.  Pt with some confusion, but per ALF he has been less and less mobile and has recently been near w/c bound (has been using one of the facilities w/c, but needs his own). Patient suffers from respiratory failure, progressive OA, thyroid disease, dementia which impairs his ability to perform daily activities like toileting, feeding, dressing, grooming, bathing in his ALF. A cane, walker, crutch will not resolve the patient's issue with performing activities of daily living. A wheelchair is required/recommended and will allow patient to safely perform daily activities.  Patient can safely propel the wheelchair in the home or has a caregiver who can provide assistance.      Follow Up Recommendations Supervision/Assistance - 24 hour(return to ALF)    Equipment Recommendations  Wheelchair (measurements PT)    Recommendations for Other Services       Precautions / Restrictions Precautions Precautions: Fall Restrictions Weight Bearing Restrictions: No      Mobility  Bed Mobility Overal bed mobility: Needs Assistance Bed Mobility: Supine to Sit;Sit to Supine     Supine to sit: Min assist Sit to supine: Min guard   General bed mobility comments: Pt needed light assist to get to EOB, but generally did well and was able to get to  supine  Transfers Overall transfer level: Needs assistance Equipment used: Rolling walker (2 wheeled) Transfers: Sit to/from Stand;Lateral/Scoot Transfers Sit to Stand: Min assist        Lateral/Scoot Transfers: Min guard General transfer comment: Slightly raised bed (~2"), cues for UE placement/use, pt showed great effort and clearly needed less assist than yesterday.  Pt was able to do a few lateral scoots along EOB, did not need assist and showed real possibility to be able to transfer to w/c with training  Ambulation/Gait             General Gait Details: Pt was able to take a few small side shuffle steps along EOB, but did not feel (and likely was not) safe to get away from the bed.  Stairs            Wheelchair Mobility    Modified Rankin (Stroke Patients Only)       Balance Overall balance assessment: Needs assistance Sitting-balance support: Feet supported;Single extremity supported Sitting balance-Leahy Scale: Good Sitting balance - Comments: Once fully upright and squared to EOB pt was able to maintain sitting balance w/o assist    Standing balance support: Bilateral upper extremity supported;During functional activity Standing balance-Leahy Scale: Fair Standing balance comment: Pt not as anxious about PT/standing as he way yesterday though he still had some hesitancy and anxiety about standing                             Pertinent Vitals/Pain Pain Assessment: 0-10 Pain Score: 4  Pain Location: R knee    Home Living  Prior Function                 Hand Dominance        Extremity/Trunk Assessment                Communication      Cognition Arousal/Alertness: Awake/alert Behavior During Therapy: WFL for tasks assessed/performed;Anxious;Agitated Overall Cognitive Status: History of cognitive impairments - at baseline                                        General  Comments      Exercises General Exercises - Lower Extremity Ankle Circles/Pumps: AROM;10 reps;Both Heel Slides: Strengthening;10 reps;Both Hip ABduction/ADduction: Strengthening;10 reps;Both Straight Leg Raises: AROM;10 reps;Both Hip Flexion/Marching: Standing;10 reps;Both;AROM(with walker and min assist)   Assessment/Plan    PT Assessment    PT Problem List         PT Treatment Interventions      PT Goals (Current goals can be found in the Care Plan section)       Frequency Min 2X/week   Barriers to discharge        Co-evaluation               AM-PAC PT "6 Clicks" Mobility  Outcome Measure Help needed turning from your back to your side while in a flat bed without using bedrails?: A Little Help needed moving from lying on your back to sitting on the side of a flat bed without using bedrails?: A Lot Help needed moving to and from a bed to a chair (including a wheelchair)?: A Little Help needed standing up from a chair using your arms (e.g., wheelchair or bedside chair)?: A Little Help needed to walk in hospital room?: A Lot Help needed climbing 3-5 steps with a railing? : Total 6 Click Score: 14    End of Session Equipment Utilized During Treatment: Gait belt;Oxygen Activity Tolerance: Patient limited by fatigue;Patient tolerated treatment well Patient left: with bed alarm set;with call bell/phone within reach Nurse Communication: Mobility status PT Visit Diagnosis: Unsteadiness on feet (R26.81);Muscle weakness (generalized) (M62.81);Difficulty in walking, not elsewhere classified (R26.2)    Time: 1020-1050 PT Time Calculation (min) (ACUTE ONLY): 30 min   Charges:     PT Treatments $Therapeutic Exercise: 8-22 mins $Therapeutic Activity: 8-22 mins        Malachi ProGalen R Naria Abbey, DPT 11/03/2018, 11:53 AM

## 2018-11-03 NOTE — Care Management Important Message (Signed)
Important Message  Patient Details  Name: Eric [name] MRN: [id number] Date of Birth: [birth year]   Medicare Important Message Given:  Yes    Virgel Manifold, RN 11/03/2018, 12:44 PM

## 2018-11-03 NOTE — Care Management Important Message (Signed)
Important Message  Patient Details  Name: Eric [name] MRN: [id number] Date of Birth: 1945/12/16   Medicare Important Message Given:  Yes  Initial Medicare IM signed with assistance of Buddy Duty, RN CM.   Johnell Comings 11/03/2018, 11:09 AM

## 2018-11-15 ENCOUNTER — Inpatient Hospital Stay
Admission: EM | Admit: 2018-11-15 | Discharge: 2018-11-18 | DRG: 292 | Disposition: A | Discharge status: Nursing Home (Includes ICF) | Payer: Medicare Other | Attending: Internal Medicine | Admitting: Internal Medicine

## 2018-11-15 ENCOUNTER — Encounter: Payer: Self-pay | Admitting: *Deleted

## 2018-11-15 ENCOUNTER — Emergency Department: Payer: Medicare Other

## 2018-11-15 ENCOUNTER — Other Ambulatory Visit: Payer: Self-pay

## 2018-11-15 DIAGNOSIS — I480 Paroxysmal atrial fibrillation: Secondary | ICD-10-CM | POA: Diagnosis present

## 2018-11-15 DIAGNOSIS — E039 Hypothyroidism, unspecified: Secondary | ICD-10-CM | POA: Diagnosis present

## 2018-11-15 DIAGNOSIS — Z9114 Patient's other noncompliance with medication regimen: Secondary | ICD-10-CM

## 2018-11-15 DIAGNOSIS — G4730 Sleep apnea, unspecified: Secondary | ICD-10-CM | POA: Diagnosis present

## 2018-11-15 DIAGNOSIS — E162 Hypoglycemia, unspecified: Secondary | ICD-10-CM | POA: Diagnosis present

## 2018-11-15 DIAGNOSIS — R601 Generalized edema: Secondary | ICD-10-CM

## 2018-11-15 DIAGNOSIS — F411 Generalized anxiety disorder: Secondary | ICD-10-CM | POA: Diagnosis present

## 2018-11-15 DIAGNOSIS — I5042 Chronic combined systolic (congestive) and diastolic (congestive) heart failure: Secondary | ICD-10-CM | POA: Diagnosis present

## 2018-11-15 DIAGNOSIS — R0602 Shortness of breath: Secondary | ICD-10-CM

## 2018-11-15 DIAGNOSIS — H269 Unspecified cataract: Secondary | ICD-10-CM | POA: Diagnosis present

## 2018-11-15 DIAGNOSIS — F329 Major depressive disorder, single episode, unspecified: Secondary | ICD-10-CM | POA: Diagnosis present

## 2018-11-15 DIAGNOSIS — I110 Hypertensive heart disease with heart failure: Principal | ICD-10-CM | POA: Diagnosis present

## 2018-11-15 DIAGNOSIS — M7989 Other specified soft tissue disorders: Secondary | ICD-10-CM | POA: Diagnosis not present

## 2018-11-15 DIAGNOSIS — N4889 Other specified disorders of penis: Secondary | ICD-10-CM

## 2018-11-15 DIAGNOSIS — Z20828 Contact with and (suspected) exposure to other viral communicable diseases: Secondary | ICD-10-CM | POA: Diagnosis present

## 2018-11-15 DIAGNOSIS — F05 Delirium due to known physiological condition: Secondary | ICD-10-CM | POA: Diagnosis present

## 2018-11-15 DIAGNOSIS — F0390 Unspecified dementia without behavioral disturbance: Secondary | ICD-10-CM | POA: Diagnosis present

## 2018-11-15 DIAGNOSIS — N5089 Other specified disorders of the male genital organs: Secondary | ICD-10-CM

## 2018-11-15 DIAGNOSIS — Z7901 Long term (current) use of anticoagulants: Secondary | ICD-10-CM

## 2018-11-15 DIAGNOSIS — H409 Unspecified glaucoma: Secondary | ICD-10-CM | POA: Diagnosis present

## 2018-11-15 DIAGNOSIS — I5033 Acute on chronic diastolic (congestive) heart failure: Secondary | ICD-10-CM | POA: Diagnosis present

## 2018-11-15 DIAGNOSIS — Z888 Allergy status to other drugs, medicaments and biological substances status: Secondary | ICD-10-CM

## 2018-11-15 DIAGNOSIS — E079 Disorder of thyroid, unspecified: Secondary | ICD-10-CM | POA: Diagnosis present

## 2018-11-15 DIAGNOSIS — Z79899 Other long term (current) drug therapy: Secondary | ICD-10-CM

## 2018-11-15 DIAGNOSIS — Z79890 Hormone replacement therapy (postmenopausal): Secondary | ICD-10-CM

## 2018-11-15 DIAGNOSIS — Z91148 Patient's other noncompliance with medication regimen for other reason: Secondary | ICD-10-CM

## 2018-11-15 DIAGNOSIS — Z885 Allergy status to narcotic agent status: Secondary | ICD-10-CM

## 2018-11-15 DIAGNOSIS — I10 Essential (primary) hypertension: Secondary | ICD-10-CM | POA: Diagnosis present

## 2018-11-15 DIAGNOSIS — Z820 Family history of epilepsy and other diseases of the nervous system: Secondary | ICD-10-CM

## 2018-11-15 DIAGNOSIS — R1909 Other intra-abdominal and pelvic swelling, mass and lump: Secondary | ICD-10-CM | POA: Diagnosis present

## 2018-11-15 DIAGNOSIS — Z89021 Acquired absence of right finger(s): Secondary | ICD-10-CM

## 2018-11-15 DIAGNOSIS — Z87891 Personal history of nicotine dependence: Secondary | ICD-10-CM

## 2018-11-15 DIAGNOSIS — F419 Anxiety disorder, unspecified: Secondary | ICD-10-CM | POA: Diagnosis present

## 2018-11-15 DIAGNOSIS — K4090 Unilateral inguinal hernia, without obstruction or gangrene, not specified as recurrent: Secondary | ICD-10-CM | POA: Diagnosis present

## 2018-11-15 DIAGNOSIS — Z91013 Allergy to seafood: Secondary | ICD-10-CM

## 2018-11-15 HISTORY — DX: Hypothyroidism, unspecified: E03.9

## 2018-11-15 HISTORY — DX: Acute respiratory failure with hypoxia: J96.01

## 2018-11-15 HISTORY — DX: Heart failure, unspecified: I50.9

## 2018-11-15 HISTORY — DX: Long term (current) use of anticoagulants: Z79.01

## 2018-11-15 HISTORY — DX: Unspecified dementia, unspecified severity, without behavioral disturbance, psychotic disturbance, mood disturbance, and anxiety: F03.90

## 2018-11-15 HISTORY — DX: Unspecified atrial fibrillation: I48.91

## 2018-11-15 LAB — CBC WITH DIFFERENTIAL/PLATELET
Abs Immature Granulocytes: 0.06 10*3/uL (ref 0.00–0.07)
Basophils Absolute: 0 10*3/uL (ref 0.0–0.1)
Basophils Relative: 0 %
Eosinophils Absolute: 0.1 10*3/uL (ref 0.0–0.5)
Eosinophils Relative: 1 %
HCT: 39.4 % (ref 39.0–52.0)
Hemoglobin: 13 g/dL (ref 13.0–17.0)
Immature Granulocytes: 1 %
Lymphocytes Relative: 3 %
Lymphs Abs: 0.4 10*3/uL — ABNORMAL LOW (ref 0.7–4.0)
MCH: 30.7 pg (ref 26.0–34.0)
MCHC: 33 g/dL (ref 30.0–36.0)
MCV: 92.9 fL (ref 80.0–100.0)
Monocytes Absolute: 0.9 10*3/uL (ref 0.1–1.0)
Monocytes Relative: 8 %
Neutro Abs: 10 10*3/uL — ABNORMAL HIGH (ref 1.7–7.7)
Neutrophils Relative %: 87 %
Platelets: 184 10*3/uL (ref 150–400)
RBC: 4.24 MIL/uL (ref 4.22–5.81)
RDW: 13.8 % (ref 11.5–15.5)
WBC: 11.4 10*3/uL — ABNORMAL HIGH (ref 4.0–10.5)
nRBC: 0 % (ref 0.0–0.2)

## 2018-11-15 LAB — BRAIN NATRIURETIC PEPTIDE: B Natriuretic Peptide: 20 pg/mL (ref 0.0–100.0)

## 2018-11-15 LAB — COMPREHENSIVE METABOLIC PANEL
ALT: 10 U/L (ref 0–44)
AST: 19 U/L (ref 15–41)
Albumin: 3.5 g/dL (ref 3.5–5.0)
Alkaline Phosphatase: 61 U/L (ref 38–126)
Anion gap: 9 (ref 5–15)
BUN: 13 mg/dL (ref 8–23)
CO2: 27 mmol/L (ref 22–32)
Calcium: 8.1 mg/dL — ABNORMAL LOW (ref 8.9–10.3)
Chloride: 100 mmol/L (ref 98–111)
Creatinine, Ser: 0.83 mg/dL (ref 0.61–1.24)
GFR calc Af Amer: >60 mL/min (ref >60)
GFR calc non Af Amer: >60 mL/min (ref >60)
Glucose, Bld: 144 mg/dL — ABNORMAL HIGH (ref 70–99)
Potassium: 4 mmol/L (ref 3.5–5.1)
Sodium: 136 mmol/L (ref 135–145)
Total Bilirubin: 1.9 mg/dL — ABNORMAL HIGH (ref 0.3–1.2)
Total Protein: 6.8 g/dL (ref 6.5–8.1)

## 2018-11-15 LAB — TROPONIN I: Troponin I: <0.03 ng/mL (ref <0.03)

## 2018-11-15 LAB — MAGNESIUM: Magnesium: 1.9 mg/dL (ref 1.7–2.4)

## 2018-11-15 NOTE — ED Provider Notes (Signed)
[facility] Emergency Department Provider Note  ____________________________________________   First MD Initiated Contact with Patient 11/15/18 2319     (approximate)  I have reviewed the triage vital signs and the nursing notes.   HISTORY  Chief Complaint Leg Swelling and Groin Swelling  Level 5 caveat:  history/ROS limited by what I suspect is a degree of chronic dementia based on my assessment tonight as well as review of his medical record which suggests decreased cognitive function at baseline.  HPI Eric [name] is a 73 y.o. male with medical history as listed below who presents by EMS from his assisted living facility (the [address]) for evaluation of worsening lower extremity and scrotal swelling.  It is unclear how long this is been going on but it seems to have gotten acutely worse over the last couple of days although it is more of a long-term problem he was seen in the emergency department and hospitalized about 2 weeks ago for volume overload leading to CHF exacerbation and acute respiratory failure.  He does not think he has been taking any medications but his ability to provide a history is limited and we are trying to verify with the facility if he has been taking his furosemide 80 mg twice daily.  He says that he does not have any pain in his scrotum but it is very swollen.  He is still able to urinate but he is worried about the degree of swelling and not just in the scrotum and penis but also in his legs and he states that his legs feel tight.  His abdomen is a little bit swollen but he has no abdominal pain.  He says he is always short of breath but his breathing has been a little bit harder than usual but he still feels like he can breathe.  He denies fever, sore throat, cough, chest pain, nausea, and vomiting.  He said this is like it was before and he does not know why they did not give him enough medicine to keep this from happening.  He  says that he took his medicines for a while after his last hospitalizations but "I ran out".   He describes his swelling is severe and nothing in particular makes it better or worse.         Past Medical History:  Diagnosis Date   Acute respiratory failure with hypoxia (HCC)    multiple prior admissions to [facility] and [facility] for CHF exacerbation / respiratory failure   Anxiety and depression    Atrial fibrillation (HCC) 07/25/2018   new-onset during [facility] admission in Jan 2020   Cataract    CHF (congestive heart failure) (HCC)    EF <= 50% as of Jan 2020   Current use of long term anticoagulation 07/2018   Started on apixaban for new-onset a-fib in Jan 2020   Dementia [facility])    possible, likely age-related, documented decreased cognitive function on prior hospitalizations   Gallstones    Glaucoma    Hypertension    Hypoglycemia    Hypothyroidism    Inguinal hernia    Sleep apnea    Thyroid disease     Patient Active Problem List   Diagnosis Date Noted   Generalized anxiety disorder 11/03/2018   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 Authorizing Provider  acetaminophen (TYLENOL) 325 MG tablet Take 2 tablets (650 mg total) by mouth every 6 (six) hours as needed for mild pain (or Fever >/= 101). 11/03/18   Alford HighlandWieting, Richard, MD  albuterol (ACCUNEB) 0.63 MG/3ML nebulizer solution Take 1 ampule by nebulization every 6 (six) hours as needed for shortness of breath.    [provider]  bimatoprost (LUMIGAN) 0.01 % SOLN Place 1 drop into both eyes at bedtime.    [provider]  cephALEXin (KEFLEX) 500 MG capsule Take 1 capsule (500 mg total) by mouth every 8  (eight) hours. 11/03/18   Alford HighlandWieting, Richard, MD  cyanocobalamin 500 MCG tablet Take 500 mcg by mouth 3 (three) times a week.    [provider]  diazepam (VALIUM) 10 MG tablet Take 1 tablet (10 mg total) by mouth every 6 (six) hours as needed for anxiety. 11/03/18   Alford HighlandWieting, Richard, MD  furosemide (LASIX) 20 MG tablet Take 1 tablet (20 mg total) by mouth daily. 11/04/18   Alford HighlandWieting, Richard, MD  HYDROcodone-acetaminophen (NORCO) 7.5-325 MG tablet Take 1 tablet by mouth every 6 (six) hours as needed for moderate pain or severe pain. For pain 11/03/18   Alford HighlandWieting, Richard, MD  levothyroxine (SYNTHROID) 150 MCG tablet Take 1 tablet (150 mcg total) by mouth daily. 11/03/18 11/03/19  Alford HighlandWieting, Richard, MD  liver oil-zinc oxide (DESITIN) 40 % ointment Apply topically daily. 11/04/18   Alford HighlandWieting, Richard, MD  Melatonin 3 MG TABS Take 1 tablet by mouth daily.    [provider]  metoprolol succinate (TOPROL-XL) 25 MG 24 hr tablet Take 1 tablet (25 mg total) by mouth daily. 11/04/18   Alford HighlandWieting, Richard, MD  OLANZapine zydis (ZYPREXA) 10 MG disintegrating tablet Take 1 tablet (10 mg total) by mouth 2 (two) times daily. 11/03/18   Alford HighlandWieting, Richard, MD  senna-docusate (SENEXON-S) 8.6-50 MG tablet Take 2 tablets by mouth 2 (two) times daily.    [provider]  sertraline (ZOLOFT) 50 MG tablet Take 1 tablet (50 mg total) by mouth daily. 11/03/18   Alford HighlandWieting, Richard, MD  Vitamin D, Ergocalciferol, (DRISDOL) 1.25 MG (50000 UT) CAPS capsule Take 50,000 Units by mouth every 7 (seven) days.    [provider]    Allergies Demerol [meperidine]; Epinephrine; Nardil [phenelzine]; Morphine and related; Ondansetron; and Shellfish allergy  Family History  Problem Relation Age of Onset   Alzheimer's disease Mother     Social History Social History   Tobacco Use   Smoking status: Former Smoker    Last attempt to quit: 10/18/2010    Years since quitting: 8.0   Smokeless tobacco: Never Used    Substance Use Topics   Alcohol use: No   Drug use: No    Review of Systems Level 5 caveat:  history/ROS limited by what I suspect is a degree of chronic dementia based on my assessment tonight as well as review of his medical record which suggests decreased cognitive function at baseline.  Constitutional: No fever/chills Eyes: No visual changes. ENT: No sore throat. Cardiovascular: Denies chest pain. Respiratory: Some worsening of chronic shortness of breath. Gastrointestinal: No abdominal pain.  No nausea, no vomiting.  No diarrhea.  No constipation. Genitourinary: Severe swelling of his penis and scrotum.  Negative for dysuria. Musculoskeletal: Swelling in both lower extremities. Integumentary: Rash on his inner thighs next to his scrotum.  Neurological: Negative for headaches, focal weakness or numbness.   ____________________________________________   PHYSICAL EXAM:  VITAL SIGNS: ED Triage Vitals  Enc Vitals Group     BP 11/15/18 2253 (!) 145/84     Pulse Rate 11/15/18 2253 95     Resp 11/15/18 2253 20     Temp 11/15/18 2253 98.2 F (36.8 C)     Temp Source 11/15/18 2253 Oral     SpO2 11/15/18 2253 98 %     Weight 11/15/18 2255 112 kg (246 lb 14.6 oz)     Height 11/15/18 2255 1.575 m ( )     Head Circumference --      Peak Flow --      Pain Score 11/15/18 2255 0     Pain Loc --      Pain Edu? --      Excl. in GC? --     Constitutional: Alert and oriented to person and place.  Appears to have chronic illness but is in no acute distress at this time.  Wearing his chronic oxygen. Eyes: Conjunctivae are normal.  Head: Atraumatic. Nose: No congestion/rhinnorhea. Mouth/Throat: Mucous membranes are moist. Neck: No stridor.  No meningeal signs.   Cardiovascular: Normal rate, regular rhythm. Good peripheral circulation. Grossly normal heart sounds. Respiratory: Slightly increased work of breathing with some accessory muscle usage but no retractions.  Moving good  air, speaking in full sentences.  Not in distress.  No audible wheezing. Gastrointestinal: Soft but somewhat distended.  No tenderness to palpation. GU: Severe edema of the penis and scrotum.  He has excoriation along the thighs and along the base of the scrotum where they rub against each other but there is no crepitus and no evidence of acute infection.  No sign of Fournier's gangrene.  The scrotum is tense and edematous but nontender.  There is no discharge from the penis. Musculoskeletal: The patient has anasarca with tense peripheral edema throughout the lower extremities all the way up to and including the thighs and hips which extends into the abdominal edema as well.  He has chronic skin changes in bilateral lower extremities but no evidence of cellulitis. Neurologic:  Normal speech and language. No gross focal neurologic deficits are appreciated.  Skin:  Skin is warm, dry and intact. No rash noted. Psychiatric: Mood and affect are normal. Speech and behavior are normal.  He is generally cooperative but I suspect there is a degree of confabulation as his history changes from moment to moment and he seems to remember events that occurred during his last hospitalization but that he will ask questions about something we just discussed.  I strongly suspect he suffers from age-related dementia to a greater degree than has been previously identified.  His symptoms are more consistent with dementia than they are with delirium although either 1 or both are possible.  ____________________________________________   LABS (all labs ordered are listed, but only abnormal results are displayed)  Labs Reviewed  CBC WITH DIFFERENTIAL/PLATELET - Abnormal; Notable for the following components:      Result Value   WBC 11.4 (*)    Neutro Abs 10.0 (*)    Lymphs Abs 0.4 (*)    All other components within normal limits  COMPREHENSIVE METABOLIC PANEL - Abnormal; Notable for the following components:   Glucose,  Bld 144 (*)    Calcium 8.1 (*)    Total Bilirubin 1.9 (*)    All other components within normal limits  SARS CORONAVIRUS 2 [facility]  ORDER, PERFORMED IN [facility])  BRAIN NATRIURETIC PEPTIDE  TROPONIN I  MAGNESIUM  LACTIC ACID, PLASMA  PROCALCITONIN  PROTIME-INR  APTT  LACTIC ACID, PLASMA   ____________________________________________  EKG  ED ECG REPORT I, Loleta Rose, the attending physician, personally viewed and interpreted this ECG.  Date: 11/15/2018 EKG Time: 22: 59 Rate: 87 Rhythm: normal sinus rhythm QRS Axis: normal Intervals: normal ST/T Wave abnormalities: normal Narrative Interpretation: no evidence of acute ischemia  ____________________________________________  RADIOLOGY I, Loleta Rose, personally viewed and evaluated these images (plain radiographs) as part of my medical decision making, as well as reviewing the written report by the radiologist.  ED MD interpretation:  No acute abnormalities on CXR  Official radiology report(s): Dg Chest Portable 1 View  Result Date: 11/15/2018 CLINICAL DATA:  Shortness of breath.  Congestive heart failure. EXAM: PORTABLE CHEST 1 VIEW COMPARISON:  10/30/2018 FINDINGS: Stable mild cardiomegaly. No evidence of pulmonary infiltrate or edema. No evidence of pleural effusion. IMPRESSION: Stable mild cardiomegaly.  No active lung disease. Electronically Signed   By: Myles Rosenthal M.D.   On: 11/15/2018 23:43    ____________________________________________   PROCEDURES   Procedure(s) performed (including Critical Care):  Procedures   ____________________________________________   INITIAL IMPRESSION / MDM / ASSESSMENT AND PLAN / ED COURSE  As part of my medical decision making, I reviewed the following data within the electronic MEDICAL RECORD NUMBER Nursing notes reviewed and incorporated, Labs reviewed , EKG interpreted , Old chart reviewed, Radiograph reviewed , Discussed with admitting physician  and  Notes from prior ED visits      **Eric [name] was evaluated in Emergency Department on 11/15/2018 for the symptoms described in the history of present illness. He was evaluated in the context of the global COVID-19 pandemic, which necessitated consideration that the patient might be at risk for infection with the SARS-CoV-2 virus that causes COVID-19. Institutional protocols and algorithms that pertain to the evaluation of patients at risk for COVID-19 are in a state of rapid change based on information released by regulatory bodies including the CDC and federal and state organizations. These policies and algorithms were followed during the patient's care in the ED.**  Differential diagnosis includes, but is not limited to, CHF exacerbation/volume overload, anasarca, pulmonary vascular congestion, pulmonary edema, cellulitis, DVT.  The patient symptoms are most consistent with volume overload and I reviewed his medical record extensively.  He has had multiple visits and admissions over the years to [facility] and to [facility] for acute respiratory failure in the setting of volume overload.  My staff is attempting to find out from his assisted living facility if he has been compliant with his Lasix (he claims he has not been, but his history is unreliable).  He is supposed to be taking furosemide 80 mg twice daily.  He is quite clearly volume overloaded at this time with no sign of acute infection.  However, given his high risk as a patient at a nursing facility with some respiratory complaints, I am going to send a rapid COVID-19 swab to assist with appropriate placement once the results of his lab work are back.  He will need admission for diuresis and careful monitoring of his ins and outs.  He may also benefit from a repeat echocardiogram.  I have explained to the patient that he will be staying in the hospital and he is in agreement with this plan.  Clinical Course as of Nov 16 203  Wed Nov 15, 2018  2346 My  charge nurse spoke directly with a representative at the [facility].  The staff verified that the patient has been refusing his medications, including his furosemide and apixaban, for the last 10 days.  This certainly explains his volume overload.  I am giving a dose of Lasix 80 mg IV to begin the diuresis and will continue with the plan for admission once his SARS coronavirus 2 results are back.   [CF]  2346 The patient's comprehensive metabolic panel is within normal limits except for very slightly elevated total bilirubin of 1.9 which is of little clinical significance given a lack of abdominal pain.  His CBC demonstrates a very mild leukocytosis of 11.4.  Magnesium is 1.9 and within normal limits and the troponin is normal.   [CF]  2347 Stable cardiomegaly, no overt pulmonary edema or pleural effusions, although some mild interstitial edema is possible.  DG Chest Portable 1 View [CF]  2355 WNL  B Natriuretic Peptide: 20.0 [CF]  2355 Troponin I: <0.03 [CF]  Thu Nov 16, 2018  0030 Highly doubt systemic infection.  Procalcitonin: <0.10 [CF]  0038 SARS Coronavirus 2: NEGATIVE [CF]  0038 Discussed case in person with Dr. Anne Hahn who evaluated the patient in the ED and admitted him.   [CF]  [id number] Of note, patient requires a Foley catheter due to urinary retention in the setting of penile swelling.  He was unable to urinate spontaneously, and careful ins and outs will need to be monitored anyway.   [CF]    Clinical Course User Index [CF] Loleta Rose, MD     ____________________________________________  FINAL CLINICAL IMPRESSION(S) / ED DIAGNOSES  Final diagnoses:  Anasarca  Non compliance w medication regimen  Scrotal edema  Penile edema  Chronic shortness of breath     MEDICATIONS GIVEN DURING THIS VISIT:  Medications - No data to display   ED Discharge Orders    None       Note:  This document was prepared using Dragon voice recognition software and may include  unintentional dictation errors.   Loleta Rose, MD 11/16/18 [phone]

## 2018-11-15 NOTE — ED Notes (Signed)
Called the [facility] to inquire about last lasix dose and staff stated he has refused to take his medicines since 4/19. Dr. York Cerise made aware.

## 2018-11-15 NOTE — ED Triage Notes (Signed)
Per EMS pt here from [facility] with scrotal swelling. He was here 2-3 weeks for the same. His legs and scrotum are very edematous and red. Pt states he can void

## 2018-11-16 ENCOUNTER — Other Ambulatory Visit: Payer: Self-pay

## 2018-11-16 DIAGNOSIS — E039 Hypothyroidism, unspecified: Secondary | ICD-10-CM | POA: Diagnosis present

## 2018-11-16 DIAGNOSIS — I480 Paroxysmal atrial fibrillation: Secondary | ICD-10-CM | POA: Diagnosis present

## 2018-11-16 DIAGNOSIS — F419 Anxiety disorder, unspecified: Secondary | ICD-10-CM | POA: Diagnosis present

## 2018-11-16 DIAGNOSIS — H269 Unspecified cataract: Secondary | ICD-10-CM | POA: Diagnosis present

## 2018-11-16 DIAGNOSIS — Z9114 Patient's other noncompliance with medication regimen: Secondary | ICD-10-CM | POA: Diagnosis not present

## 2018-11-16 DIAGNOSIS — F411 Generalized anxiety disorder: Secondary | ICD-10-CM | POA: Diagnosis present

## 2018-11-16 DIAGNOSIS — I5033 Acute on chronic diastolic (congestive) heart failure: Secondary | ICD-10-CM | POA: Diagnosis present

## 2018-11-16 DIAGNOSIS — N4889 Other specified disorders of penis: Secondary | ICD-10-CM | POA: Diagnosis present

## 2018-11-16 DIAGNOSIS — R1909 Other intra-abdominal and pelvic swelling, mass and lump: Secondary | ICD-10-CM | POA: Diagnosis present

## 2018-11-16 DIAGNOSIS — G4730 Sleep apnea, unspecified: Secondary | ICD-10-CM | POA: Diagnosis present

## 2018-11-16 DIAGNOSIS — F0390 Unspecified dementia without behavioral disturbance: Secondary | ICD-10-CM | POA: Diagnosis present

## 2018-11-16 DIAGNOSIS — E162 Hypoglycemia, unspecified: Secondary | ICD-10-CM | POA: Diagnosis present

## 2018-11-16 DIAGNOSIS — K4090 Unilateral inguinal hernia, without obstruction or gangrene, not specified as recurrent: Secondary | ICD-10-CM | POA: Diagnosis present

## 2018-11-16 DIAGNOSIS — Z7901 Long term (current) use of anticoagulants: Secondary | ICD-10-CM | POA: Diagnosis not present

## 2018-11-16 DIAGNOSIS — F05 Delirium due to known physiological condition: Secondary | ICD-10-CM | POA: Diagnosis present

## 2018-11-16 DIAGNOSIS — F329 Major depressive disorder, single episode, unspecified: Secondary | ICD-10-CM | POA: Diagnosis present

## 2018-11-16 DIAGNOSIS — Z79890 Hormone replacement therapy (postmenopausal): Secondary | ICD-10-CM | POA: Diagnosis not present

## 2018-11-16 DIAGNOSIS — N5089 Other specified disorders of the male genital organs: Secondary | ICD-10-CM | POA: Diagnosis present

## 2018-11-16 DIAGNOSIS — R601 Generalized edema: Secondary | ICD-10-CM | POA: Diagnosis present

## 2018-11-16 DIAGNOSIS — I10 Essential (primary) hypertension: Secondary | ICD-10-CM | POA: Diagnosis present

## 2018-11-16 DIAGNOSIS — M7989 Other specified soft tissue disorders: Secondary | ICD-10-CM | POA: Diagnosis present

## 2018-11-16 DIAGNOSIS — E079 Disorder of thyroid, unspecified: Secondary | ICD-10-CM | POA: Diagnosis present

## 2018-11-16 DIAGNOSIS — H409 Unspecified glaucoma: Secondary | ICD-10-CM | POA: Diagnosis present

## 2018-11-16 DIAGNOSIS — I110 Hypertensive heart disease with heart failure: Secondary | ICD-10-CM | POA: Diagnosis present

## 2018-11-16 DIAGNOSIS — I5042 Chronic combined systolic (congestive) and diastolic (congestive) heart failure: Secondary | ICD-10-CM | POA: Diagnosis present

## 2018-11-16 DIAGNOSIS — Z79899 Other long term (current) drug therapy: Secondary | ICD-10-CM | POA: Diagnosis not present

## 2018-11-16 DIAGNOSIS — Z20828 Contact with and (suspected) exposure to other viral communicable diseases: Secondary | ICD-10-CM | POA: Diagnosis present

## 2018-11-16 LAB — SARS CORONAVIRUS 2 BY RT PCR (HOSPITAL ORDER, PERFORMED IN ~~LOC~~ HOSPITAL LAB): SARS Coronavirus 2: NEGATIVE

## 2018-11-16 LAB — CBC
HCT: 38 % — ABNORMAL LOW (ref 39.0–52.0)
Hemoglobin: 12.5 g/dL — ABNORMAL LOW (ref 13.0–17.0)
MCH: 31 pg (ref 26.0–34.0)
MCHC: 32.9 g/dL (ref 30.0–36.0)
MCV: 94.3 fL (ref 80.0–100.0)
Platelets: 162 10*3/uL (ref 150–400)
RBC: 4.03 MIL/uL — ABNORMAL LOW (ref 4.22–5.81)
RDW: 13.5 % (ref 11.5–15.5)
WBC: 10 10*3/uL (ref 4.0–10.5)
nRBC: 0 % (ref 0.0–0.2)

## 2018-11-16 LAB — APTT: aPTT: 31 seconds (ref 24–36)

## 2018-11-16 LAB — PROTIME-INR
INR: 1.1 (ref 0.8–1.2)
Prothrombin Time: 13.6 seconds (ref 11.4–15.2)

## 2018-11-16 LAB — PROCALCITONIN: Procalcitonin: <0.1 ng/mL

## 2018-11-16 LAB — BASIC METABOLIC PANEL
Anion gap: 7 (ref 5–15)
BUN: 13 mg/dL (ref 8–23)
CO2: 30 mmol/L (ref 22–32)
Calcium: 8.1 mg/dL — ABNORMAL LOW (ref 8.9–10.3)
Chloride: 103 mmol/L (ref 98–111)
Creatinine, Ser: 0.93 mg/dL (ref 0.61–1.24)
GFR calc Af Amer: >60 mL/min (ref >60)
GFR calc non Af Amer: >60 mL/min (ref >60)
Glucose, Bld: 106 mg/dL — ABNORMAL HIGH (ref 70–99)
Potassium: 3.5 mmol/L (ref 3.5–5.1)
Sodium: 140 mmol/L (ref 135–145)

## 2018-11-16 LAB — TSH: TSH: 17.807 u[IU]/mL — ABNORMAL HIGH (ref 0.350–4.500)

## 2018-11-16 LAB — LACTIC ACID, PLASMA: Lactic Acid, Venous: 0.9 mmol/L (ref 0.5–1.9)

## 2018-11-16 MED ORDER — DIPHENHYDRAMINE HCL 50 MG/ML IJ SOLN
INTRAMUSCULAR | Status: AC
  Administered 2018-11-16: 50 mg
  Filled 2018-11-16: qty 1

## 2018-11-16 MED ORDER — OLANZAPINE 10 MG PO TBDP: 10.0000 mg | ORAL_TABLET | Freq: Every day | ORAL | Status: DC

## 2018-11-16 MED ORDER — OLANZAPINE 10 MG PO TBDP
20.0000 mg | ORAL_TABLET | Freq: Every day | ORAL | Status: DC
  Administered 2018-11-16: 20 mg via ORAL
  Filled 2018-11-16 (×3): qty 2

## 2018-11-16 MED ORDER — SERTRALINE HCL 50 MG PO TABS
50.0000 mg | ORAL_TABLET | Freq: Every day | ORAL | Status: DC
  Administered 2018-11-17: 50 mg via ORAL
  Filled 2018-11-16 (×3): qty 1

## 2018-11-16 MED ORDER — FUROSEMIDE 10 MG/ML IJ SOLN
40.0000 mg | Freq: Two times a day (BID) | INTRAMUSCULAR | Status: DC
  Administered 2018-11-16 – 2018-11-18 (×6): 40 mg via INTRAVENOUS
  Filled 2018-11-16 (×6): qty 4

## 2018-11-16 MED ORDER — DIAZEPAM 5 MG PO TABS
10.0000 mg | ORAL_TABLET | Freq: Four times a day (QID) | ORAL | Status: DC | PRN
  Administered 2018-11-16 – 2018-11-18 (×2): 10 mg via ORAL
  Filled 2018-11-16 (×2): qty 2

## 2018-11-16 MED ORDER — LORAZEPAM 2 MG/ML IJ SOLN: 1.0000 mg | Freq: Four times a day (QID) | INTRAMUSCULAR | Status: DC | PRN

## 2018-11-16 MED ORDER — UMECLIDINIUM-VILANTEROL 62.5-25 MCG/INH IN AEPB
1.0000 | INHALATION_SPRAY | Freq: Every day | RESPIRATORY_TRACT | Status: DC
  Administered 2018-11-18: 1 via RESPIRATORY_TRACT
  Filled 2018-11-16: qty 14

## 2018-11-16 MED ORDER — METOPROLOL SUCCINATE ER 25 MG PO TB24
25.0000 mg | ORAL_TABLET | Freq: Every day | ORAL | Status: DC
  Administered 2018-11-17 – 2018-11-18 (×2): 25 mg via ORAL
  Filled 2018-11-16 (×2): qty 1

## 2018-11-16 MED ORDER — ACETAMINOPHEN 325 MG PO TABS: 650.0000 mg | ORAL_TABLET | Freq: Four times a day (QID) | ORAL | Status: DC | PRN

## 2018-11-16 MED ORDER — HALOPERIDOL LACTATE 5 MG/ML IJ SOLN
INTRAMUSCULAR | Status: AC
  Administered 2018-11-16: 5 mg via INTRAVENOUS
  Filled 2018-11-16: qty 1

## 2018-11-16 MED ORDER — OLANZAPINE 10 MG PO TBDP
10.0000 mg | ORAL_TABLET | Freq: Two times a day (BID) | ORAL | Status: DC
  Filled 2018-11-16 (×2): qty 1

## 2018-11-16 MED ORDER — LORAZEPAM 2 MG/ML IJ SOLN: 1.0000 mg | Freq: Two times a day (BID) | INTRAMUSCULAR | Status: DC | PRN

## 2018-11-16 MED ORDER — LORAZEPAM 2 MG/ML IJ SOLN
INTRAMUSCULAR | Status: AC
  Filled 2018-11-16: qty 1

## 2018-11-16 MED ORDER — OLANZAPINE 10 MG PO TBDP
10.0000 mg | ORAL_TABLET | Freq: Every day | ORAL | Status: DC
  Administered 2018-11-18: 10 mg via ORAL
  Filled 2018-11-16 (×2): qty 1

## 2018-11-16 MED ORDER — ACETAMINOPHEN 650 MG RE SUPP: 650.0000 mg | Freq: Four times a day (QID) | RECTAL | Status: DC | PRN

## 2018-11-16 MED ORDER — DIPHENHYDRAMINE HCL 50 MG/ML IJ SOLN: 12.5000 mg | Freq: Once | INTRAMUSCULAR | Status: DC

## 2018-11-16 MED ORDER — LEVOTHYROXINE SODIUM 50 MCG PO TABS
150.0000 ug | ORAL_TABLET | Freq: Every day | ORAL | Status: DC
  Administered 2018-11-16 – 2018-11-17 (×2): 150 ug via ORAL
  Filled 2018-11-16 (×2): qty 1

## 2018-11-16 MED ORDER — HALOPERIDOL LACTATE 5 MG/ML IJ SOLN
5.0000 mg | Freq: Once | INTRAMUSCULAR | Status: AC
  Administered 2018-11-16: 04:00:00 5 mg via INTRAVENOUS

## 2018-11-16 MED ORDER — ENOXAPARIN SODIUM 40 MG/0.4ML ~~LOC~~ SOLN
40.0000 mg | Freq: Two times a day (BID) | SUBCUTANEOUS | Status: DC
  Administered 2018-11-16: 40 mg via SUBCUTANEOUS
  Filled 2018-11-16 (×4): qty 0.4

## 2018-11-16 MED ORDER — LORAZEPAM 2 MG/ML IJ SOLN
2.0000 mg | Freq: Once | INTRAMUSCULAR | Status: AC
  Administered 2018-11-16: 2 mg via INTRAVENOUS

## 2018-11-16 MED ORDER — METOLAZONE 5 MG PO TABS
5.0000 mg | ORAL_TABLET | Freq: Once | ORAL | Status: DC
  Filled 2018-11-16: qty 1

## 2018-11-16 NOTE — H&P (Signed)
[facility]   PATIENT NAME: Eric [name] Cozzolino    MR#:  [id number]  DATE OF BIRTH:  1946/05/05  DATE OF ADMISSION:  11/15/2018  PRIMARY CARE PHYSICIAN: Dortha KernBliss, Laura K, MD   REQUESTING/REFERRING PHYSICIAN: York CeriseForbach, MD  CHIEF COMPLAINT:   Chief Complaint  Patient presents with  . Leg Swelling  . Groin Swelling    HISTORY OF PRESENT ILLNESS:  Eric [name] Kegley  is a 73 y.o. male who presents with chief complaint as above.  Patient presents to the ED for anasarca.  He states that he has not been able to afford his Lasix, though he reportedly has been refusing to take the medication the facility where he stays.  He has significant edema all over, the remarkably he does not seem to have edema in his lungs on imaging.  Hospitalist were called for admission  PAST MEDICAL HISTORY:   Past Medical History:  Diagnosis Date  . Acute respiratory failure with hypoxia (HCC)    multiple prior admissions to [facility] and [facility] for CHF exacerbation / respiratory failure  . Anxiety and depression   . Atrial fibrillation (HCC) 07/25/2018   new-onset during [facility] admission in Jan 2020  . Cataract   . CHF (congestive heart failure) (HCC)    EF <= 50% as of Jan 2020  . Current use of long term anticoagulation 07/2018   Started on apixaban for new-onset a-fib in Jan 2020  . Dementia (HCC)    possible, likely age-related, documented decreased cognitive function on prior hospitalizations  . Gallstones   . Glaucoma   . Hypertension   . Hypoglycemia   . Hypothyroidism   . Inguinal hernia   . Sleep apnea   . Thyroid disease      PAST SURGICAL HISTORY:   Past Surgical History:  Procedure Laterality Date  . FINGER AMPUTATION Right      SOCIAL HISTORY:   Social History   Tobacco Use  . Smoking status: Former Smoker    Last attempt to quit: 10/18/2010    Years since quitting: 8.0  . Smokeless tobacco: Never Used  Substance Use Topics  . Alcohol use: No      FAMILY HISTORY:   Family History  Problem Relation Age of Onset  . Alzheimer's disease Mother      DRUG ALLERGIES:   Allergies  Allergen Reactions  . Demerol [Meperidine] Other (See Comments)    Will counteract with a drug he is taking causing fatal reaction with nardil  . Epinephrine Other (See Comments)    Nardil reaction-With epi?  . Nardil [Phenelzine] Other (See Comments)  . Morphine And Related Other (See Comments)    Reacts with nardil  . Ondansetron     Made patient want to climb the walls  . Shellfish Allergy Hives    MEDICATIONS AT HOME:   Prior to Admission medications   Medication Sig Start Date End Date Taking? Authorizing Provider  albuterol (PROVENTIL) (2.5 MG/3ML) 0.083% nebulizer solution Take 1 ampule by nebulization every 6 (six) hours as needed for shortness of breath.    Yes [provider]  ANORO ELLIPTA 62.5-25 MCG/INH AEPB Inhale 1 puff into the lungs every 6 (six) hours. 10/12/18  Yes [provider]  furosemide (LASIX) 20 MG tablet Take 1 tablet (20 mg total) by mouth daily. 11/04/18  Yes Wieting, Richard, MD  levothyroxine (SYNTHROID) 150 MCG tablet Take 1 tablet (150 mcg total) by mouth daily. 11/03/18 11/03/19 Yes Wieting, Richard,  MD  Melatonin 3 MG TABS Take 1 tablet by mouth daily.   Yes [provider]  metoprolol succinate (TOPROL-XL) 25 MG 24 hr tablet Take 1 tablet (25 mg total) by mouth daily. 11/04/18  Yes Wieting, Richard, MD  senna-docusate (SENEXON-S) 8.6-50 MG tablet Take 2 tablets by mouth 2 (two) times daily.   Yes [provider]  Vitamin D, Ergocalciferol, (DRISDOL) 1.25 MG (50000 UT) CAPS capsule Take 50,000 Units by mouth every 7 (seven) days.   Yes [provider]  acetaminophen (TYLENOL) 325 MG tablet Take 2 tablets (650 mg total) by mouth every 6 (six) hours as needed for mild pain (or Fever >/= 101). Patient not taking: Reported on 11/16/2018 11/03/18   Alford Highland, MD   bimatoprost (LUMIGAN) 0.01 % SOLN Place 1 drop into both eyes at bedtime.    [provider]  cephALEXin (KEFLEX) 500 MG capsule Take 1 capsule (500 mg total) by mouth every 8 (eight) hours. Patient not taking: Reported on 11/16/2018 11/03/18   Alford Highland, MD  cyanocobalamin 500 MCG tablet Take 500 mcg by mouth 3 (three) times a week.    [provider]  diazepam (VALIUM) 10 MG tablet Take 1 tablet (10 mg total) by mouth every 6 (six) hours as needed for anxiety. Patient not taking: Reported on 11/16/2018 11/03/18   Alford Highland, MD  HYDROcodone-acetaminophen (NORCO) 7.5-325 MG tablet Take 1 tablet by mouth every 6 (six) hours as needed for moderate pain or severe pain. For pain Patient not taking: Reported on 11/16/2018 11/03/18   Alford Highland, MD  liver oil-zinc oxide (DESITIN) 40 % ointment Apply topically daily. 11/04/18   Alford Highland, MD  OLANZapine zydis (ZYPREXA) 10 MG disintegrating tablet Take 1 tablet (10 mg total) by mouth 2 (two) times daily. 11/03/18   Alford Highland, MD  sertraline (ZOLOFT) 50 MG tablet Take 1 tablet (50 mg total) by mouth daily. 11/03/18   Alford Highland, MD    REVIEW OF SYSTEMS:  Review of Systems  Constitutional: Negative for chills, fever, malaise/fatigue and weight loss.  HENT: Negative for ear pain, hearing loss and tinnitus.   Eyes: Negative for blurred vision, double vision, pain and redness.  Respiratory: Negative for cough, hemoptysis and shortness of breath.   Cardiovascular: Negative for chest pain, palpitations and orthopnea.       Generalized edema  Gastrointestinal: Negative for abdominal pain, constipation, diarrhea, nausea and vomiting.  Genitourinary: Negative for dysuria, frequency and hematuria.  Musculoskeletal: Negative for back pain, joint pain and neck pain.  Skin:       No acne, rash, or lesions  Neurological: Negative for dizziness, tremors, focal weakness and weakness.  Endo/Heme/Allergies: Negative  for polydipsia. Does not bruise/bleed easily.  Psychiatric/Behavioral: Negative for depression. The patient is not nervous/anxious and does not have insomnia.      VITAL SIGNS:   Vitals:   11/15/18 2330 11/16/18 0000 11/16/18 0030 11/16/18 0100  BP: 117/88 129/73 (!) 154/99 120/74  Pulse: 76 80 98 81  Resp: 14 12 (!) 23 12  Temp:      TempSrc:      SpO2: 96% 96% 95% 92%  Weight:      Height:       Wt Readings from Last 3 Encounters:  11/15/18 112 kg  10/29/18 111.9 kg  05/24/16 99.8 kg    PHYSICAL EXAMINATION:  Physical Exam  Vitals reviewed. Constitutional: He is oriented to person, place, and time. He appears well-developed and well-nourished. No distress.  HENT:  Head: Normocephalic and atraumatic.  Mouth/Throat: Oropharynx is clear and moist.  Eyes: Pupils are equal, round, and reactive to light. Conjunctivae and EOM are normal. No scleral icterus.  Neck: Normal range of motion. Neck supple. No JVD present. No thyromegaly present.  Cardiovascular: Normal rate, regular rhythm and intact distal pulses. Exam reveals no gallop and no friction rub.  No murmur heard. Respiratory: Effort normal and breath sounds normal. No respiratory distress. He has no wheezes. He has no rales.  GI: Soft. Bowel sounds are normal. He exhibits no distension. There is no abdominal tenderness.  Musculoskeletal: Normal range of motion.        General: Edema (Generalized) present.     Comments: No arthritis, no gout  Lymphadenopathy:    He has no cervical adenopathy.  Neurological: He is alert and oriented to person, place, and time. No cranial nerve deficit.  No dysarthria, no aphasia  Skin: Skin is warm and dry. No rash noted. No erythema.  Psychiatric: He has a normal mood and affect. His behavior is normal. Judgment and thought content normal.    LABORATORY PANEL:   CBC Recent Labs  Lab 11/15/18 2259  WBC 11.4*  HGB 13.0  HCT 39.4  PLT 184    ------------------------------------------------------------------------------------------------------------------  Chemistries  Recent Labs  Lab 11/15/18 2259  NA 136  K 4.0  CL 100  CO2 27  GLUCOSE 144*  BUN 13  CREATININE 0.83  CALCIUM 8.1*  MG 1.9  AST 19  ALT 10  ALKPHOS 61  BILITOT 1.9*   ------------------------------------------------------------------------------------------------------------------  Cardiac Enzymes Recent Labs  Lab 11/15/18 2259  TROPONINI <0.03   ------------------------------------------------------------------------------------------------------------------  RADIOLOGY:  Dg Chest Portable 1 View  Result Date: 11/15/2018 CLINICAL DATA:  Shortness of breath.  Congestive heart failure. EXAM: PORTABLE CHEST 1 VIEW COMPARISON:  10/30/2018 FINDINGS: Stable mild cardiomegaly. No evidence of pulmonary infiltrate or edema. No evidence of pleural effusion. IMPRESSION: Stable mild cardiomegaly.  No active lung disease. Electronically Signed   By: Myles Rosenthal M.D.   On: 11/15/2018 23:43    EKG:   Orders placed or performed during the hospital encounter of 11/15/18  . ED EKG  . ED EKG    IMPRESSION AND PLAN:  Principal Problem:   Anasarca -IV Lasix.  Patient's BNP is not significantly elevated, and he does not seem to have pulmonary edema on imaging.  We will start him back on his home dose daily Lasix and expect that it would likely take a couple of days to improve his edema Active Problems:   Chronic combined systolic and diastolic CHF (congestive heart failure) (HCC) -continue home meds   Generalized anxiety disorder -home dose anxiolytics   PAF (paroxysmal atrial fibrillation) (HCC) -continue home medications   HTN (hypertension) -home dose antihypertensives   Hypothyroidism -home dose thyroid replacement  Chart review performed and case discussed with ED provider. Labs, imaging and/or ECG reviewed by provider and discussed with  patient/family. Management plans discussed with the patient and/or family.  DVT PROPHYLAXIS: SubQ lovenox   GI PROPHYLAXIS:  None  ADMISSION STATUS: Inpatient     CODE STATUS: Full Code Status History    Date Active Date Inactive Code Status Order ID Comments User Context   10/29/2018 0211 11/03/2018 1904 Full Code [id number]  Barbaraann Rondo, MD Inpatient      TOTAL TIME TAKING CARE OF THIS PATIENT: 45 minutes.   Barney Drain 11/16/2018, 1:35 AM  [facility]  Office  [phone]  CC:  Primary care physician; Dortha Kern, MD  Note:  This document was prepared using Dragon voice recognition software and may include unintentional dictation errors.

## 2018-11-16 NOTE — ED Notes (Signed)
ED TO INPATIENT HANDOFF REPORT  ED Nurse Name and Phone #:  Madelon LipsJen  S Name/Age/Gender Eric [name] 73 y.o. male Room/Bed: ED08A/ED08A  Code Status   Code Status: Prior  Home/SNF/Other Nursing Home Patient oriented to: self Is this baseline? Yes      Chief Complaint Groin Pain  T[facility] Note [facility]. He was here 2-3 weeks for the same. His legs and scrotum are very edematous and red. Pt states he can void    Allergies Allergies  Allergen Reactions  . Demerol [Meperidine] Other (See Comments)    Will counteract with a drug he is taking causing fatal reaction with nardil  . Epinephrine Other (See Comments)    Nardil reaction-With epi?  . Nardil [Phenelzine] Other (See Comments)  . Morphine And Related Other (See Comments)    Reacts with nardil  . Ondansetron     Made patient want to climb the walls  . Shellfish Allergy Hives    Level of Care/Admitting Diagnosis ED Disposition    ED Disposition Condition Comment   Admit  [facility] [[id number]]  Level of Care: Med-Surg [16]  Covid Evaluation: N/A  Diagnosis: Anasarca [[id number]][[id number]]  Admitting Physician: Oralia ManisWILLIS, DAVID [[id number]]  Attending Physician: Oralia ManisWILLIS, DAVID [id number][[id number]]  Estimated length of stay: past midnight tomorrow  Certification:: I certify this patient will need inpatient services for at least 2 midnights  PT Class (Do Not Modify): Inpatient [101]  PT Acc Code (Do Not Modify): Private [1]       B Medical/Surgery History Past Medical History:  Diagnosis Date  . Acute respiratory failure with hypoxia (HCC)    multiple prior [facility] and [facility] for CHF exacerbation / respiratory failure  . Anxiety and depression   . Atrial fibrillation (HCC) 07/25/2018   new-onset during [facility] admission in Jan 2020  . Cataract   . CHF (congestive heart failure) (HCC)    EF <= 50% as of Jan 2020  . Current use of long term  anticoagulation 07/2018   Started on apixaban for new-onset a-fib in Jan 2020  . Dementia (HCC)    possible, likely age-related, documented decreased cognitive function on prior hospitalizations  . Gallstones   . Glaucoma   . Hypertension   . Hypoglycemia   . Hypothyroidism   . Inguinal hernia   . Sleep apnea   . Thyroid disease    Past Surgical History:  Procedure Laterality Date  . FINGER AMPUTATION Right      A IV Location/Drains/Wounds Patient Lines/Drains/Airways Status   Active Line/Drains/Airways    Name:   Placement date:   Placement time:   Site:   Days:   Peripheral IV 11/15/18 Left Antecubital   11/15/18    2303    Antecubital   1   Peripheral IV 11/15/18 Left Forearm   11/15/18    2348    Forearm   1   Urethral Catheter J Roslyn Else Non-latex 16 Fr.   11/16/18    0030    Non-latex   less than 1          Intake/Output Last 24 hours  Intake/Output Summary (Last 24 hours) at 11/16/2018 0220 Last data filed at 11/16/2018 0126 Gross per 24 hour  Intake -  Output 200 ml  Net -200 ml    Labs/Imaging Results for orders placed or performed during the hospital encounter of 11/15/18 (from the past 48 hour(s))  CBC  with Differential/Platelet     Status: Abnormal   Collection Time: 11/15/18 10:59 PM  Result Value Ref Range   WBC 11.4 (H) 4.0 - 10.5 K/uL   RBC 4.24 4.22 - 5.81 MIL/uL   Hemoglobin 13.0 13.0 - 17.0 g/dL   HCT 16.1 09.6 - 04.5 %   MCV 92.9 80.0 - 100.0 fL   MCH 30.7 26.0 - 34.0 pg   MCHC 33.0 30.0 - 36.0 g/dL   RDW 40.9 81.1 - 91.4 %   Platelets 184 150 - 400 K/uL   nRBC 0.0 0.0 - 0.2 %   Neutrophils Relative % 87 %   Neutro Abs 10.0 (H) 1.7 - 7.7 K/uL   Lymphocytes Relative 3 %   Lymphs Abs 0.4 (L) 0.7 - 4.0 K/uL   Monocytes Relative 8 %   Monocytes Absolute 0.9 0.1 - 1.0 K/uL   Eosinophils Relative 1 %   Eosinophils Absolute 0.1 0.0 - 0.5 K/uL   Basophils Relative 0 %   Basophils Absolute 0.0 0.0 - 0.1 K/uL   Immature Granulocytes 1 %   Abs  Immature Granulocytes 0.06 0.00 - 0.07 K/uL    Comment: Performed at [facility], [address] [place], [place] [address]  Brain natriuretic peptide     Status: None   Collection Time: 11/15/18 10:59 PM  Result Value Ref Range   B Natriuretic Peptide 20.0 0.0 - 100.0 pg/mL    Comment: Performed at [facility], [address] [place], [place] [address]  Comprehensive metabolic panel     Status: Abnormal   Collection Time: 11/15/18 10:59 PM  Result Value Ref Range   Sodium 136 135 - 145 mmol/L   Potassium 4.0 3.5 - 5.1 mmol/L    Comment: HEMOLYSIS AT THIS LEVEL MAY AFFECT RESULT   Chloride 100 98 - 111 mmol/L   CO2 27 22 - 32 mmol/L   Glucose, Bld 144 (H) 70 - 99 mg/dL   BUN 13 8 - 23 mg/dL   Creatinine, Ser 8.65 0.61 - 1.24 mg/dL   Calcium 8.1 (L) 8.9 - 10.3 mg/dL   Total Protein 6.8 6.5 - 8.1 g/dL   Albumin 3.5 3.5 - 5.0 g/dL   AST 19 15 - 41 U/L   ALT 10 0 - 44 U/L   Alkaline Phosphatase 61 38 - 126 U/L   Total Bilirubin 1.9 (H) 0.3 - 1.2 mg/dL   GFR calc non Af Amer >60 >60 mL/min   GFR calc Af Amer >60 >60 mL/min   Anion gap 9 5 - 15    Comment: Performed at [facility], [address] [place], [place] [address]  Troponin I - ONCE - STAT     Status: None   Collection Time: 11/15/18 10:59 PM  Result Value Ref Range   Troponin I <0.03 <0.03 ng/mL    Comment: Performed at [facility], [address] [place], [place] [address]  Magnesium     Status: None   Collection Time: 11/15/18 10:59 PM  Result Value Ref Range   Magnesium 1.9 1.7 - 2.4 mg/dL    Comment: Performed at [facility], [address] [place], [place] [address]  Lactic acid, plasma     Status: None   Collection Time: 11/15/18 11:37 PM  Result Value Ref Range   Lactic Acid, Venous 0.9 0.5 - 1.9 mmol/L    Comment: Performed at [facility], [address], [place], [place] [address]  Procalcitonin  Status: None   Collection Time: 11/15/18  11:37 PM  Result Value Ref Range   Procalcitonin <0.10 ng/mL    Comment:        Interpretation: PCT (Procalcitonin) <= 0.5 ng/mL: Systemic infection (sepsis) is not likely. Local bacterial infection is possible. (NOTE)       Sepsis PCT Algorithm           Lower Respiratory Tract                                      Infection PCT Algorithm    ----------------------------     ----------------------------         PCT < 0.25 ng/mL                PCT < 0.10 ng/mL         Strongly encourage             Strongly discourage   discontinuation of antibiotics    initiation of antibiotics    ----------------------------     -----------------------------       PCT 0.25 - 0.50 ng/mL            PCT 0.10 - 0.25 ng/mL               OR       >80% decrease in PCT            Discourage initiation of                                            antibiotics      Encourage discontinuation           of antibiotics    ----------------------------     -----------------------------         PCT >= 0.50 ng/mL              PCT 0.26 - 0.50 ng/mL               AND        <80% decrease in PCT             Encourage initiation of                                             antibiotics       Encourage continuation           of antibiotics    ----------------------------     -----------------------------        PCT >= 0.50 ng/mL                  PCT > 0.50 ng/mL               AND         increase in PCT                  Strongly encourage                                      initiation of antibiotics    Strongly encourage  escalation           of antibiotics                                     -----------------------------                                           PCT <= 0.25 ng/mL                                                 OR                                        > 80% decrease in PCT                                     Discontinue / Do not initiate                                              antibiotics Performed at [facility], [address] [place], [place] [address]   SARS Coronavirus 2 [facility] order, Performed in [facility] hospital lab)     Status: None   Collection Time: 11/15/18 11:37 PM  Result Value Ref Range   SARS Coronavirus 2 NEGATIVE NEGATIVE    Comment: (NOTE) If result is NEGATIVE SARS-CoV-2 target nucleic acids are NOT DETECTED. The SARS-CoV-2 RNA is generally detectable in upper and lower  respiratory specimens during the acute phase of infection. The lowest  concentration of SARS-CoV-2 viral copies this assay can detect is 250  copies / mL. A negative result does not preclude SARS-CoV-2 infection  and should not be used as the sole basis for treatment or other  patient management decisions.  A negative result may occur with  improper specimen collection / handling, submission of specimen other  than nasopharyngeal swab, presence of viral mutation(s) within the  areas targeted by this assay, and inadequate number of viral copies  (<250 copies / mL). A negative result must be combined with clinical  observations, patient history, and epidemiological information. If result is POSITIVE SARS-CoV-2 target nucleic acids are DETECTED. The SARS-CoV-2 RNA is generally detectable in upper and lower  respiratory specimens dur ing the acute phase of infection.  Positive  results are indicative of active infection with SARS-CoV-2.  Clinical  correlation with patient history and other diagnostic information is  necessary to determine patient infection status.  Positive results do  not rule out bacterial infection or co-infection with other viruses. If result is PRESUMPTIVE POSTIVE SARS-CoV-2 nucleic acids MAY BE PRESENT.   A presumptive positive result was obtained on the submitted specimen  and confirmed on repeat testing.  While 2019 novel coronavirus  (SARS-CoV-2) nucleic acids may be present in the submitted sample  additional confirmatory  testing may be necessary for epidemiological  and / or clinical management  purposes  to differentiate between  SARS-CoV-2 and other Sarbecovirus currently known to infect humans.  If clinically indicated additional testing with an alternate test  methodology [phone]) is advised. The SARS-CoV-2 RNA is generally  detectable in upper and lower respiratory sp ecimens during the acute  phase of infection. The expected result is Negative. Fact Sheet for Patients:  BoilerBrush.com.cy Fact Sheet for Healthcare Providers: https://pope.com/ This test is not yet approved or cleared by the [place] FDA and has been authorized for detection and/or diagnosis of SARS-CoV-2 by FDA under an Emergency Use Authorization (EUA).  This EUA will remain in effect (meaning this test can be used) for the duration of the COVID-19 declaration under Section 564(b)(1) of the Act, 21 U.S.C. section 360bbb-3(b)(1), unless the authorization is terminated or revoked sooner. Performed at [facility], [address] [place], [place] [address]   Protime-INR     Status: None   Collection Time: 11/15/18 11:37 PM  Result Value Ref Range   Prothrombin Time 13.6 11.4 - 15.2 seconds   INR 1.1 0.8 - 1.2    Comment: (NOTE) INR goal varies based on device and disease states. Performed at [facility], [address] [place], [place] [address]   APTT     Status: None   Collection Time: 11/15/18 11:37 PM  Result Value Ref Range   aPTT 31 24 - 36 seconds    Comment: Performed at [facility], [address] [place], [place] [address]   Dg Chest Portable 1 View  Result Date: 11/15/2018 CLINICAL DATA:  Shortness of breath.  Congestive heart failure. EXAM: PORTABLE CHEST 1 VIEW COMPARISON:  10/30/2018 FINDINGS: Stable mild cardiomegaly. No evidence of pulmonary infiltrate or edema. No evidence of pleural effusion. IMPRESSION: Stable mild  cardiomegaly.  No active lung disease. Electronically Signed   By: Myles Rosenthal M.D.   On: 11/15/2018 23:43    Pending Labs Unresulted Labs (From admission, onward)    Start     Ordered   11/15/18 2325  Lactic acid, plasma  Now then every 2 hours,   STAT     11/15/18 2324   Signed and Held  CBC  (enoxaparin (LOVENOX)    CrCl >/= 30 ml/min)  Once,   R    Comments:  Baseline for enoxaparin therapy IF NOT ALREADY DRAWN.  Notify MD if PLT < 100 K.    Signed and Held   Signed and Held  Creatinine, serum  (enoxaparin (LOVENOX)    CrCl >/= 30 ml/min)  Once,   R    Comments:  Baseline for enoxaparin therapy IF NOT ALREADY DRAWN.    Signed and Held   Signed and Held  Creatinine, serum  (enoxaparin (LOVENOX)    CrCl >/= 30 ml/min)  Weekly,   R    Comments:  while on enoxaparin therapy    Signed and Held   Signed and Held  Basic metabolic panel  Tomorrow morning,   R     Signed and Held   Signed and Held  CBC  Tomorrow morning,   R     Signed and Held          Vitals/Pain Today's Vitals   11/15/18 2330 11/16/18 0000 11/16/18 0030 11/16/18 0100  BP: 117/88 129/73 (!) 154/99 120/74  Pulse: 76 80 98 81  Resp: 14 12 (!) 23 12  Temp:      TempSrc:      SpO2: 96% 96% 95% 92%  Weight:      Height:      PainSc:        Isolation Precautions Droplet and Contact precautions  Medications Medications  furosemide (LASIX) injection 40 mg (40 mg Intravenous Given 11/16/18 0124)    Mobility manual wheelchair Moderate fall risk   Focused Assessments Pulmonary Assessment Handoff:  Lung sounds:   O2 Device: Nasal Cannula O2 Flow Rate (L/min): 3 L/min      R Recommendations: See Admitting Provider Note  Report given to:   Additional Notes:

## 2018-11-16 NOTE — [place] FL2 (Addendum)
[place] MEDICAID FL2 LEVEL OF CARE SCREENING TOOL     IDENTIFICATION  Patient Name: Eric [name] Birthdate: [birth year] Sex: male Admission Date (Current Location): 11/15/2018  [place] and [place] Number:  Chiropodist and Address:  [facility], [address] [place], [place] [address]      Provider Number: [id number]  Attending Physician Name and Address:  Milagros Loll, MD  Relative Name and Phone Number:       Current Level of Care: Hospital Recommended Level of Care: Assisted Living Facility Prior Approval Number:    Date Approved/Denied:   PASRR Number: [id number]  Discharge Plan: Other (Comment)(ALF)    Current Diagnoses: Patient Active Problem List   Diagnosis Date Noted  . Chronic combined systolic and diastolic CHF (congestive heart failure) (HCC) 11/16/2018  . Anasarca 11/16/2018  . Hypothyroidism 11/16/2018  . PAF (paroxysmal atrial fibrillation) (HCC) 11/16/2018  . HTN (hypertension) 11/16/2018  . Generalized anxiety disorder 11/03/2018  . Major depression in partial remission (HCC) 11/03/2018  . Acute hypoxemic respiratory failure (HCC) 10/29/2018  . Suspected Covid-19 Virus Infection 10/29/2018  . Delirium     Orientation RESPIRATION BLADDER Height & Weight     Self, Place  Normal, O2(2l) Continent Weight: 113.2 kg Height:  5\' 2"  (157.5 cm)  BEHAVIORAL SYMPTOMS/MOOD NEUROLOGICAL BOWEL NUTRITION STATUS      Continent Diet(heart healthy )  AMBULATORY STATUS COMMUNICATION OF NEEDS Skin   Supervision Verbally Normal                       Personal Care Assistance Level of Assistance  Bathing, Dressing, Feeding Bathing Assistance: Limited assistance Feeding assistance: Independent Dressing Assistance: Limited assistance     Functional Limitations Info  Sight Sight Info: Impaired        SPECIAL CARE FACTORS FREQUENCY                       Contractures Contractures Info: Not present     Additional Factors Info  Code Status Code Status Info: full             Current Medications (11/16/2018):  This is the current hospital active medication list Current Facility-Administered Medications  Medication Dose Route Frequency Provider Last Rate Last Dose  . acetaminophen (TYLENOL) tablet 650 mg  650 mg Oral Q6H PRN Oralia Manis, MD       Or  . acetaminophen (TYLENOL) suppository 650 mg  650 mg Rectal Q6H PRN Oralia Manis, MD      . enoxaparin (LOVENOX) injection 40 mg  40 mg Subcutaneous Q12H Oralia Manis, MD      . furosemide (LASIX) injection 40 mg  40 mg Intravenous BID Oralia Manis, MD   40 mg at 11/16/18 1144  . levothyroxine (SYNTHROID) tablet 150 mcg  150 mcg Oral QAC breakfast Oralia Manis, MD   150 mcg at 11/16/18 0745  . LORazepam (ATIVAN) injection 1 mg  1 mg Intravenous Q6H PRN Sudini, Wardell Heath, MD      . metolazone (ZAROXOLYN) tablet 5 mg  5 mg Oral Once Sudini, Wardell Heath, MD      . metoprolol succinate (TOPROL-XL) 24 hr tablet 25 mg  25 mg Oral Daily Oralia Manis, MD      . Melene Muller ON 11/17/2018] OLANZapine zydis (ZYPREXA) disintegrating tablet 10 mg  10 mg Oral Daily Sudini, Srikar, MD      . OLANZapine zydis (ZYPREXA) disintegrating tablet 20 mg  20 mg Oral  QHS Milagros LollSudini, Srikar, MD      . sertraline (ZOLOFT) tablet 50 mg  50 mg Oral Daily Oralia ManisWillis, David, MD      . umeclidinium-vilanterol (ANORO ELLIPTA) 62.5-25 MCG/INH 1 puff  1 puff Inhalation Daily Oralia ManisWillis, David, MD         Discharge Medications: STOP taking these medications   cephALEXin 500 MG capsule Commonly known as:  KEFLEX   HYDROcodone-acetaminophen 7.5-325 MG tablet Commonly known as:  NORCO     TAKE these medications   acetaminophen 325 MG tablet Commonly known as:  TYLENOL Take 2 tablets (650 mg total) by mouth every 6 (six) hours as needed for mild pain (or Fever >/= 101).   albuterol 0.63 MG/3ML nebulizer solution Commonly known as:  ACCUNEB Take 1 ampule by nebulization every 6  (six) hours as needed for shortness of breath.   Anoro Ellipta 62.5-25 MCG/INH Aepb Generic drug:  umeclidinium-vilanterol Inhale 1 puff into the lungs every 6 (six) hours.   bimatoprost 0.01 % Soln Commonly known as:  LUMIGAN Place 1 drop into both eyes at bedtime.   diazepam 10 MG tablet Commonly known as:  VALIUM Take 1 tablet (10 mg total) by mouth every 6 (six) hours as needed for anxiety.   furosemide 20 MG tablet Commonly known as:  LASIX Take 1 tablet (20 mg total) by mouth daily.   levothyroxine 150 MCG tablet Commonly known as:  Synthroid Take 1 tablet (150 mcg total) by mouth daily.   liver oil-zinc oxide 40 % ointment Commonly known as:  DESITIN Apply topically daily.   Melatonin 3 MG Tabs Take 1 tablet by mouth daily.   metoprolol succinate 25 MG 24 hr tablet Commonly known as:  TOPROL-XL Take 1 tablet (25 mg total) by mouth daily.   OLANZapine zydis 10 MG disintegrating tablet Commonly known as:  ZYPREXA Take 1 tablet (10 mg total) by mouth 2 (two) times daily.   Senexon-S 8.6-50 MG tablet Generic drug:  senna-docusate Take 2 tablets by mouth 2 (two) times daily.   sertraline 50 MG tablet Commonly known as:  ZOLOFT Take 1 tablet (50 mg total) by mouth daily.   vitamin B-12 500 MCG tablet Commonly known as:  CYANOCOBALAMIN Take 500 mcg by mouth 3 (three) times a week.   Vitamin D (Ergocalciferol) 1.25 MG (50000 UT) Caps capsule Commonly known as:  DRISDOL Take 50,000 Units by mouth every 7 (seven) days.                                              Relevant Imaging Results:  Relevant Lab Results:   Additional Information ss # [id number]  Chapman FitchBOWEN, STEPHANIE T, RN

## 2018-11-16 NOTE — TOC Initial Note (Addendum)
Transition of Care [facility]) - Initial/Assessment Note    Patient Details  Name: Eric [name] MRN: [id number] Date of Birth: [birth year]  [facility]) CM/SW Contact:    Chapman Fitch, RN Phone Number: 11/16/2018, 3:54 PM  Clinical Narrative:                 Patient admitted from the [facility] for anasarca.  Reported that patient has been non compliant with lasix at facility.    RNCM spoke with Amber at the [facility] and she states "we should be able to take him back at discharge".  FL2 completed.    Per [facility] patient is predominately wheelchair bound at facility. Chronic o2  Patient asleep at this time, RNCM did not awake to complete full assessment    Expected Discharge Plan: Assisted Living Barriers to Discharge: Continued Medical Work up   Patient Goals and CMS Choice        Expected Discharge Plan and Services Expected Discharge Plan: Assisted Living       Living arrangements for the past 2 months: Assisted Living Facility Expected Discharge Date: 10/20/18                                    Prior Living Arrangements/Services Living arrangements for the past 2 months: Assisted Living Facility Lives with:: Other (Comment)(facility resident )          Need for Family Participation in Patient Care: Yes (Comment) Care giver support system in place?: Yes (comment)      Activities of Daily Living Home Assistive Devices/Equipment: Wheelchair ADL Screening (condition at time of admission) Patient's cognitive ability adequate to safely complete daily activities?: Yes Is the patient deaf or have difficulty hearing?: No Does the patient have difficulty seeing, even when wearing glasses/contacts?: No Does the patient have difficulty concentrating, remembering, or making decisions?: No Patient able to express need for assistance with ADLs?: Yes Does the patient have difficulty dressing or bathing?: Yes Independently performs ADLs?: No Communication:  Independent Dressing (OT): Needs assistance Is this a change from baseline?: Pre-admission baseline Grooming: Needs assistance Is this a change from baseline?: Pre-admission baseline Feeding: Independent Bathing: Needs assistance Is this a change from baseline?: Pre-admission baseline Toileting: Needs assistance Is this a change from baseline?: Pre-admission baseline In/Out Bed: Needs assistance Is this a change from baseline?: Pre-admission baseline Walks in Home: Dependent Is this a change from baseline?: Pre-admission baseline Does the patient have difficulty walking or climbing stairs?: Yes Weakness of Legs: Both Weakness of Arms/Hands: None  Permission Sought/Granted                  Emotional Assessment           Psych Involvement: No (comment)  Admission diagnosis:  Anasarca [R60.1] Penile edema [N48.89] Scrotal edema [N50.89] Non compliance w medication regimen [Z91.14] Chronic shortness of breath [R06.02] Patient Active Problem List   Diagnosis Date Noted  . Chronic combined systolic and diastolic CHF (congestive heart failure) (HCC) 11/16/2018  . Anasarca 11/16/2018  . Hypothyroidism 11/16/2018  . PAF (paroxysmal atrial fibrillation) (HCC) 11/16/2018  . HTN (hypertension) 11/16/2018  . Generalized anxiety disorder 11/03/2018  . Major depression in partial remission (HCC) 11/03/2018  . Acute hypoxemic respiratory failure (HCC) 10/29/2018  . Suspected Covid-19 Virus Infection 10/29/2018  . Delirium    PCP:  Dortha Kern, MD Pharmacy:   [facility] [id number] -  [place], [place] - [address] [facility] [address] [place] [id number] Phone: [phone] Fax: [phone]     Social Determinants of Health (SDOH) Interventions    Readmission Risk Interventions Readmission Risk Prevention Plan 11/03/2018 10/31/2018  Transportation Screening Complete Complete  PCP or Specialist Appt within 5-7 Days Complete -  Home  Care Screening Complete Complete  Medication Review (RN CM) Complete -  Some recent data might be hidden

## 2018-11-17 MED ORDER — POTASSIUM CHLORIDE CRYS ER 20 MEQ PO TBCR
40.0000 meq | EXTENDED_RELEASE_TABLET | Freq: Once | ORAL | Status: DC
  Filled 2018-11-17: qty 2

## 2018-11-17 NOTE — Plan of Care (Signed)
  Problem: Activity: Goal: Will identify at least one activity in which they can participate Outcome: Not Progressing Note:  Patient completely irate this AM. Will continue to monitor neurological status. According to physician patient has acute delirium on top of underlying dementia. Jari Favre [facility]

## 2018-11-17 NOTE — Progress Notes (Signed)
Patient is refusing to take his medication both Lovenox and Zyprexa tonight. Educated patient about the importance of taking his medication, still refuses. RN will continue to monitor.

## 2018-11-17 NOTE — Progress Notes (Signed)
[facility] [place] [facility]   PATIENT NAME: Kary KosDaniel Trettin    MR#:  [id number]  DATE OF BIRTH:  October 02, 1945  SUBJECTIVE:  CHIEF COMPLAINT:   Chief Complaint  Patient presents with  . Leg Swelling  . Groin Swelling   Confused REVIEW OF SYSTEMS:    Review of Systems  Unable to perform ROS: Dementia    DRUG ALLERGIES:   Allergies  Allergen Reactions  . Demerol [Meperidine] Other (See Comments)    Will counteract with a drug he is taking causing fatal reaction with nardil  . Epinephrine Other (See Comments)    Nardil reaction-With epi?  . Nardil [Phenelzine] Other (See Comments)  . Morphine And Related Other (See Comments)    Reacts with nardil  . Ondansetron     Made patient want to climb the walls  . Shellfish Allergy Hives    VITALS:  Blood pressure 116/68, pulse 98, temperature 98 F (36.7 C), resp. rate 20, height 5\' 2"  (1.575 m), weight 110.3 kg, SpO2 91 %.  PHYSICAL EXAMINATION:   Physical Exam  GENERAL:  73 y.o.-year-old patient lying in the bed with no acute distress.  EYES: Pupils equal, round, reactive to light and accommodation. No scleral icterus. Extraocular muscles intact.  HEENT: Head atraumatic, normocephalic. Oropharynx and nasopharynx clear.  NECK:  Supple, no jugular venous distention. No thyroid enlargement, no tenderness.  LUNGS: Normal breath sounds bilaterally, no wheezing, rales, rhonchi. No use of accessory muscles of respiration.  CARDIOVASCULAR: S1, S2 normal. No murmurs, rubs, or gallops.  ABDOMEN: Soft, nontender, nondistended. Bowel sounds present. No organomegaly or mass.  EXTREMITIES: No extremity edema NEUROLOGIC: Not following instructions PSYCHIATRIC: The patient is drowzy SKIN: No obvious rash, lesion, or ulcer.   LABORATORY PANEL:   CBC Recent Labs  Lab 11/16/18 0547  WBC 10.0  HGB 12.5*  HCT 38.0*  PLT 162    ------------------------------------------------------------------------------------------------------------------ Chemistries  Recent Labs  Lab 11/15/18 2259 11/16/18 0547  NA 136 140  K 4.0 3.5  CL 100 103  CO2 27 30  GLUCOSE 144* 106*  BUN 13 13  CREATININE 0.83 0.93  CALCIUM 8.1* 8.1*  MG 1.9  --   AST 19  --   ALT 10  --   ALKPHOS 61  --   BILITOT 1.9*  --    ------------------------------------------------------------------------------------------------------------------  Cardiac Enzymes Recent Labs  Lab 11/15/18 2259  TROPONINI <0.03   ------------------------------------------------------------------------------------------------------------------  RADIOLOGY:  Dg Chest Portable 1 View  Result Date: 11/15/2018 CLINICAL DATA:  Shortness of breath.  Congestive heart failure. EXAM: PORTABLE CHEST 1 VIEW COMPARISON:  10/30/2018 FINDINGS: Stable mild cardiomegaly. No evidence of pulmonary infiltrate or edema. No evidence of pleural effusion. IMPRESSION: Stable mild cardiomegaly.  No active lung disease. Electronically Signed   By: Myles RosenthalJohn  Stahl M.D.   On: 11/15/2018 23:43     ASSESSMENT AND PLAN:   *Acute on chronic diastolic congestive heart failure - IV Lasix - Input and Output - Counseled to limit fluids and Salt - Monitor Bun/Cr and Potassium - Echo reviewed  *Dementia with inpatient delirium.  Increase Zyprexa at bedtime.  Improved.   * Generalized anxiety disorder -home dose anxiolytics  *  PAF (paroxysmal atrial fibrillation) -continue home medications  *  HTN (hypertension) -home dose antihypertensives   *  Hypothyroidism -home dose thyroid replacement  All the records are reviewed and case discussed with Care Management/Social Workerr. Management plans discussed with the patient, family and they are in agreement.  CODE  STATUS: FULL CODE  DVT Prophylaxis: SCDs  TOTAL TIME TAKING CARE OF THIS PATIENT: 35 minutes.   POSSIBLE D/C IN 1-2 DAYS,  DEPENDING ON CLINICAL CONDITION.  Molinda Bailiff Aira Sallade M.D on 11/17/2018 at 12:54 PM  Between 7am to 6pm - Pager - [phone]  After 6pm go to [email or web address] - password [email or web address] [facility]  [facility]  Office  [phone]  CC: Primary care physician; Dortha Kern, MD  Note: This dictation was prepared with Dragon dictation along with smaller phrase technology. Any transcriptional errors that result from this process are unintentional.

## 2018-11-18 LAB — BASIC METABOLIC PANEL
Anion gap: 10 (ref 5–15)
BUN: 15 mg/dL (ref 8–23)
CO2: 35 mmol/L — ABNORMAL HIGH (ref 22–32)
Calcium: 8.3 mg/dL — ABNORMAL LOW (ref 8.9–10.3)
Chloride: 96 mmol/L — ABNORMAL LOW (ref 98–111)
Creatinine, Ser: 1.01 mg/dL (ref 0.61–1.24)
GFR calc Af Amer: >60 mL/min (ref >60)
GFR calc non Af Amer: >60 mL/min (ref >60)
Glucose, Bld: 110 mg/dL — ABNORMAL HIGH (ref 70–99)
Potassium: 3.7 mmol/L (ref 3.5–5.1)
Sodium: 141 mmol/L (ref 135–145)

## 2018-11-18 NOTE — Discharge Summary (Addendum)
[employer] [facility] [employer] [facility]   PATIENT NAME: Eric [name]    MR#:  [id number]  DATE OF BIRTH:  17-Sep-1945  DATE OF ADMISSION:  11/15/2018 ADMITTING PHYSICIAN: Oralia Manis, MD  DATE OF DISCHARGE: 11/18/2018  PRIMARY CARE PHYSICIAN: Dortha Kern, MD   ADMISSION DIAGNOSIS:  Anasarca [R60.1] Penile edema [N48.89] Scrotal edema [N50.89] Non compliance w medication regimen [Z91.14] Chronic shortness of breath [R06.02]  DISCHARGE DIAGNOSIS:  Principal Problem:   Anasarca Active Problems:   Generalized anxiety disorder   Chronic combined systolic and diastolic CHF (congestive heart failure) (HCC)   Hypothyroidism   PAF (paroxysmal atrial fibrillation) (HCC)   HTN (hypertension)   SECONDARY DIAGNOSIS:   Past Medical History:  Diagnosis Date  . Acute respiratory failure with hypoxia (HCC)    multiple prior admissions to [facility] and [facility] for CHF exacerbation / respiratory failure  . Anxiety and depression   . Atrial fibrillation (HCC) 07/25/2018   new-onset during [facility] admission in Jan 2020  . Cataract   . CHF (congestive heart failure) (HCC)    EF <= 50% as of Jan 2020  . Current use of long term anticoagulation 07/2018   Started on apixaban for new-onset a-fib in Jan 2020  . Dementia (HCC)    possible, likely age-related, documented decreased cognitive function on prior hospitalizations  . Gallstones   . Glaucoma   . Hypertension   . Hypoglycemia   . Hypothyroidism   . Inguinal hernia   . Sleep apnea   . Thyroid disease      ADMITTING HISTORY  Dhillon Lamie  is a 73 y.o. male who presents with chief complaint as above.  Patient presents to the ED for anasarca.  He states that he has not been able to afford his Lasix, though he reportedly has been refusing to take the medication the facility where he stays.  He has significant edema all over, the remarkably he does not seem to have edema in his lungs on imaging.  Hospitalist were called for  admission  HOSPITAL COURSE:   *Acute on chronic diastolic congestive heart failure - IV Lasix started with good response.  -10 L.  Change to oral Lasix. - Input and Output monitored - Counseled to limit fluids and Salt but poor understanding - Monitored Bun/Cr and Potassium - Echo reviewed.  EF of 50 to 55%  *Dementia with inpatient delirium.  Should improve once he returns to his family surroundings Continue Zyprexa  *Generalized anxiety disorder -home dose anxiolytics  *PAF (paroxysmal atrial fibrillation) -continue home medications  *HTN (hypertension) -home dose antihypertensives  * Hypothyroidism -home dose thyroid replacement .  TSH is elevated due to patient not taking medication regularly.  Patient has been refusing medications which is causing most of his problems at this point.  Unfortunately this will continue with his dementia.  I have called the son multiple times but was not able to reach him.  At this time patient seems appropriate for DNR/DNI.  Would benefit from palliative care following and possibly hospice involved if he continues to worsen.  High risk for readmission.  Stable at this time for discharge  CONSULTS OBTAINED:    DRUG ALLERGIES:   Allergies  Allergen Reactions  . Demerol [Meperidine] Other (See Comments)    Will counteract with a drug he is taking causing fatal reaction with nardil  . Epinephrine Other (See Comments)    Nardil reaction-With epi?  . Nardil [Phenelzine] Other (See Comments)  . Morphine  And Related Other (See Comments)    Reacts with nardil  . Ondansetron     Made patient want to climb the walls  . Shellfish Allergy Hives    DISCHARGE MEDICATIONS:   Allergies as of 11/18/2018      Reactions   Demerol [meperidine] Other (See Comments)   Will counteract with a drug he is taking causing fatal reaction with nardil   Epinephrine Other (See Comments)   Nardil reaction-With epi?   Nardil [phenelzine] Other (See  Comments)   Morphine And Related Other (See Comments)   Reacts with nardil   Ondansetron    Made patient want to climb the walls   Shellfish Allergy Hives      Medication List    STOP taking these medications   cephALEXin 500 MG capsule Commonly known as:  KEFLEX   HYDROcodone-acetaminophen 7.5-325 MG tablet Commonly known as:  NORCO     TAKE these medications   acetaminophen 325 MG tablet Commonly known as:  TYLENOL Take 2 tablets (650 mg total) by mouth every 6 (six) hours as needed for mild pain (or Fever >/= 101).   albuterol 0.63 MG/3ML nebulizer solution Commonly known as:  ACCUNEB Take 1 ampule by nebulization every 6 (six) hours as needed for shortness of breath.   Anoro Ellipta 62.5-25 MCG/INH Aepb Generic drug:  umeclidinium-vilanterol Inhale 1 puff into the lungs every 6 (six) hours.   bimatoprost 0.01 % Soln Commonly known as:  LUMIGAN Place 1 drop into both eyes at bedtime.   diazepam 10 MG tablet Commonly known as:  VALIUM Take 1 tablet (10 mg total) by mouth every 6 (six) hours as needed for anxiety.   furosemide 20 MG tablet Commonly known as:  LASIX Take 1 tablet (20 mg total) by mouth daily.   levothyroxine 150 MCG tablet Commonly known as:  Synthroid Take 1 tablet (150 mcg total) by mouth daily.   liver oil-zinc oxide 40 % ointment Commonly known as:  DESITIN Apply topically daily.   Melatonin 3 MG Tabs Take 1 tablet by mouth daily.   metoprolol succinate 25 MG 24 hr tablet Commonly known as:  TOPROL-XL Take 1 tablet (25 mg total) by mouth daily.   OLANZapine zydis 10 MG disintegrating tablet Commonly known as:  ZYPREXA Take 1 tablet (10 mg total) by mouth 2 (two) times daily.   Senexon-S 8.6-50 MG tablet Generic drug:  senna-docusate Take 2 tablets by mouth 2 (two) times daily.   sertraline 50 MG tablet Commonly known as:  ZOLOFT Take 1 tablet (50 mg total) by mouth daily.   vitamin B-12 500 MCG tablet Commonly known as:   CYANOCOBALAMIN Take 500 mcg by mouth 3 (three) times a week.   Vitamin D (Ergocalciferol) 1.25 MG (50000 UT) Caps capsule Commonly known as:  DRISDOL Take 50,000 Units by mouth every 7 (seven) days.       Today   VITAL SIGNS:  Blood pressure 119/66, pulse 86, temperature 98.4 F (36.9 C), temperature source Oral, resp. rate 20, height 5\' 2"  (1.575 m), weight 106.3 kg, SpO2 92 %.  I/O:    Intake/Output Summary (Last 24 hours) at 11/18/2018 0920 Last data filed at 11/18/2018 0917 Gross per 24 hour  Intake -  Output 4500 ml  Net -4500 ml    PHYSICAL EXAMINATION:  Physical Exam  GENERAL:  73 y.o.-year-old patient lying in the bed with no acute distress.  LUNGS: Normal breath sounds bilaterally, no wheezing, rales,rhonchi or crepitation. No use of  accessory muscles of respiration.  CARDIOVASCULAR: S1, S2 normal. No murmurs, rubs, or gallops.  ABDOMEN: Soft, non-tender, non-distended. Bowel sounds present. No organomegaly or mass.  NEUROLOGIC: Moves all 4 extremities. PSYCHIATRIC: The patient is alert and awake SKIN: No obvious rash, lesion, or ulcer.   DATA REVIEW:   CBC Recent Labs  Lab 11/16/18 0547  WBC 10.0  HGB 12.5*  HCT 38.0*  PLT 162    Chemistries  Recent Labs  Lab 11/15/18 2259  11/18/18 0508  NA 136   < > 141  K 4.0   < > 3.7  CL 100   < > 96*  CO2 27   < > 35*  GLUCOSE 144*   < > 110*  BUN 13   < > 15  CREATININE 0.83   < > 1.01  CALCIUM 8.1*   < > 8.3*  MG 1.9  --   --   AST 19  --   --   ALT 10  --   --   ALKPHOS 61  --   --   BILITOT 1.9*  --   --    < > = values in this interval not displayed.    Cardiac Enzymes Recent Labs  Lab 11/15/18 2259  TROPONINI <0.03    Microbiology Results  Results for orders placed or performed during the hospital encounter of 11/15/18  SARS Coronavirus 2 [facility] order, Performed in [facility] hospital lab)     Status: None   Collection Time: 11/15/18 11:37 PM  Result Value Ref Range Status   SARS  Coronavirus 2 NEGATIVE NEGATIVE Final    Comment: (NOTE) If result is NEGATIVE SARS-CoV-2 target nucleic acids are NOT DETECTED. The SARS-CoV-2 RNA is generally detectable in upper and lower  respiratory specimens during the acute phase of infection. The lowest  concentration of SARS-CoV-2 viral copies this assay can detect is 250  copies / mL. A negative result does not preclude SARS-CoV-2 infection  and should not be used as the sole basis for treatment or other  patient management decisions.  A negative result may occur with  improper specimen collection / handling, submission of specimen other  than nasopharyngeal swab, presence of viral mutation(s) within the  areas targeted by this assay, and inadequate number of viral copies  (<250 copies / mL). A negative result must be combined with clinical  observations, patient history, and epidemiological information. If result is POSITIVE SARS-CoV-2 target nucleic acids are DETECTED. The SARS-CoV-2 RNA is generally detectable in upper and lower  respiratory specimens dur ing the acute phase of infection.  Positive  results are indicative of active infection with SARS-CoV-2.  Clinical  correlation with patient history and other diagnostic information is  necessary to determine patient infection status.  Positive results do  not rule out bacterial infection or co-infection with other viruses. If result is PRESUMPTIVE POSTIVE SARS-CoV-2 nucleic acids MAY BE PRESENT.   A presumptive positive result was obtained on the submitted specimen  and confirmed on repeat testing.  While 2019 novel coronavirus  (SARS-CoV-2) nucleic acids may be present in the submitted sample  additional confirmatory testing may be necessary for epidemiological  and / or clinical management purposes  to differentiate between  SARS-CoV-2 and other Sarbecovirus currently known to infect humans.  If clinically indicated additional testing with an alternate test   methodology [phone]) is advised. The SARS-CoV-2 RNA is generally  detectable in upper and lower respiratory sp ecimens during the acute  phase  of infection. The expected result is Negative. Fact Sheet for Patients:  BoilerBrush.com.cy Fact Sheet for Healthcare Providers: https://pope.com/ This test is not yet approved or cleared by the [place] FDA and has been authorized for detection and/or diagnosis of SARS-CoV-2 by FDA under an Emergency Use Authorization (EUA).  This EUA will remain in effect (meaning this test can be used) for the duration of the COVID-19 declaration under Section 564(b)(1) of the Act, 21 U.S.C. section 360bbb-3(b)(1), unless the authorization is terminated or revoked sooner. Performed at [facility], [address] [place], [place] [address]     RADIOLOGY:  No results found.  Follow up with PCP in 1 week.  Management plans discussed with the patient, family and they are in agreement.  CODE STATUS:     Code Status Orders  (From admission, onward)         Start     Ordered   11/16/18 0314  Full code  Continuous     11/16/18 0313        Code Status History    Date Active Date Inactive Code Status Order ID Comments User Context   10/29/2018 0211 11/03/2018 1904 Full Code [id number]  Barbaraann Rondo, MD Inpatient      TOTAL TIME TAKING CARE OF THIS PATIENT ON DAY OF DISCHARGE: more than 30 minutes.   Molinda Bailiff Danikah Budzik M.D on 11/18/2018 at 9:20 AM  Between 7am to 6pm - Pager - [phone]  After 6pm go to [email or web address] - password [email or web address] [facility]  [facility]  Office  [phone]  CC: Primary care physician; Dortha Kern, MD  Note: This dictation was prepared with Dragon dictation along with smaller phrase technology. Any transcriptional errors that result from this process are unintentional.

## 2018-11-18 NOTE — Progress Notes (Signed)
Patient transported to [facility] by EMS. VSS stable.

## 2018-11-18 NOTE — Progress Notes (Signed)
Report given to [place] at [facility]. EMS called for transport. IV's taken out and tele monitor off. Will continue to monitor.

## 2018-11-18 NOTE — Discharge Instructions (Signed)
°-   Daily fluids < 2 liters. °- Low salt diet °- Check weight everyday and keep log. Take to your doctors appt. °- Take extra dose of lasix if you gain more than 3 pounds weight. ° ° °

## 2018-11-18 NOTE — Progress Notes (Addendum)
Foley catheter removed per MD orders. Patient will be discharging back to facility today. Patient tolerated well with no complaints.  Upon educating patient on meds this am- patient is refusing lovenox injection and sertraline. Despite education patient still refusing.   Update 917: pt has voided 29ml after foley taken out.

## 2018-11-18 NOTE — TOC Transition Note (Signed)
Transition of Care [facility]) - CM/SW Discharge Note   Patient Details  Name: Eric [name] MRN: [id number] Date of Birth: 29-Sep-1945  [facility] CM/SW Contact:  Darleene Cleaver, LCSW Phone Number: 11/18/2018, 12:06 PM   Clinical Narrative:    CSW contacted [facility] and spoke with Amalia Hailey the administrator, and he said patient can return back today.  CSW faxed updated FL2, and discharge summary to facility, and included hard copies of discharge summary in the packet.  CSW attempted to call patient's son, unable to leave a message.  Patient to be d/c'ed today to [facility].  Patient agreeable to plans will transport via ems RN to call report to [phone].   Final next level of care: Assisted Living Barriers to Discharge: No Barriers Identified   Patient Goals and CMS Choice Patient states their goals for this hospitalization and ongoing recovery are:: To return back to [facility] CMS Medicare.gov Compare Post Acute Care list provided to:: Patient Choice offered to / list presented to : Patient  Discharge Placement              Patient chooses bed at: Other - please specify in the comment section below:([facility]) Patient to be transferred to facility by: [facility] Name of family member notified: Tried calling son Trey Paula, unable to leave a message    Discharge Plan and Services   Patient will be discharging back to [facility], CSW attempted to call patient's son, unable to get a hold of him, however administrator is aware that patient is returning today.      Social Determinants of Health (SDOH) Interventions     Readmission Risk Interventions Readmission Risk Prevention Plan 11/03/2018 10/31/2018  Transportation Screening Complete Complete  PCP or Specialist Appt within 5-7 Days Complete -  Home Care Screening Complete Complete  Medication Review (RN CM) Complete -  Some recent data might be hidden

## 2018-11-18 NOTE — Plan of Care (Signed)
  Problem: Activity: Goal: Will identify at least one activity in which they can participate Outcome: Adequate for Discharge   Problem: Coping: Goal: Ability to identify and develop effective coping behavior will improve Outcome: Adequate for Discharge Goal: Ability to interact with others will improve Outcome: Adequate for Discharge Goal: Demonstration of participation in decision-making regarding own care will improve Outcome: Adequate for Discharge Goal: Ability to use eye contact when communicating with others will improve Outcome: Adequate for Discharge   Problem: Health Behavior/Discharge Planning: Goal: Identification of resources available to assist in meeting health care needs will improve Outcome: Adequate for Discharge   Problem: Self-Concept: Goal: Will verbalize positive feelings about self Outcome: Adequate for Discharge

## 2019-01-18 ENCOUNTER — Emergency Department: Payer: Medicare Other

## 2019-01-18 ENCOUNTER — Emergency Department
Admission: EM | Admit: 2019-01-18 | Discharge: 2019-01-18 | Discharge status: Against Medical Advice | Payer: Medicare Other | Attending: Emergency Medicine | Admitting: Emergency Medicine

## 2019-01-18 DIAGNOSIS — Z79899 Other long term (current) drug therapy: Secondary | ICD-10-CM | POA: Diagnosis not present

## 2019-01-18 DIAGNOSIS — R062 Wheezing: Secondary | ICD-10-CM | POA: Insufficient documentation

## 2019-01-18 DIAGNOSIS — Z20828 Contact with and (suspected) exposure to other viral communicable diseases: Secondary | ICD-10-CM | POA: Diagnosis not present

## 2019-01-18 DIAGNOSIS — F0390 Unspecified dementia without behavioral disturbance: Secondary | ICD-10-CM | POA: Diagnosis not present

## 2019-01-18 DIAGNOSIS — Z87891 Personal history of nicotine dependence: Secondary | ICD-10-CM | POA: Diagnosis not present

## 2019-01-18 DIAGNOSIS — R110 Nausea: Secondary | ICD-10-CM | POA: Diagnosis present

## 2019-01-18 DIAGNOSIS — I110 Hypertensive heart disease with heart failure: Secondary | ICD-10-CM | POA: Insufficient documentation

## 2019-01-18 DIAGNOSIS — E039 Hypothyroidism, unspecified: Secondary | ICD-10-CM | POA: Diagnosis not present

## 2019-01-18 DIAGNOSIS — I5042 Chronic combined systolic (congestive) and diastolic (congestive) heart failure: Secondary | ICD-10-CM | POA: Diagnosis not present

## 2019-01-18 LAB — CBC WITH DIFFERENTIAL/PLATELET
Abs Immature Granulocytes: 0.05 10*3/uL (ref 0.00–0.07)
Basophils Absolute: 0 10*3/uL (ref 0.0–0.1)
Basophils Relative: 0 %
Eosinophils Absolute: 0.2 10*3/uL (ref 0.0–0.5)
Eosinophils Relative: 1 %
HCT: 41.7 % (ref 39.0–52.0)
Hemoglobin: 13.5 g/dL (ref 13.0–17.0)
Immature Granulocytes: 1 %
Lymphocytes Relative: 4 %
Lymphs Abs: 0.5 10*3/uL — ABNORMAL LOW (ref 0.7–4.0)
MCH: 29.7 pg (ref 26.0–34.0)
MCHC: 32.4 g/dL (ref 30.0–36.0)
MCV: 91.9 fL (ref 80.0–100.0)
Monocytes Absolute: 0.9 10*3/uL (ref 0.1–1.0)
Monocytes Relative: 9 %
Neutro Abs: 9.1 10*3/uL — ABNORMAL HIGH (ref 1.7–7.7)
Neutrophils Relative %: 85 %
Platelets: 166 10*3/uL (ref 150–400)
RBC: 4.54 MIL/uL (ref 4.22–5.81)
RDW: 13.3 % (ref 11.5–15.5)
WBC: 10.6 10*3/uL — ABNORMAL HIGH (ref 4.0–10.5)
nRBC: 0 % (ref 0.0–0.2)

## 2019-01-18 LAB — COMPREHENSIVE METABOLIC PANEL
ALT: 12 U/L (ref 0–44)
AST: 13 U/L — ABNORMAL LOW (ref 15–41)
Albumin: 3.4 g/dL — ABNORMAL LOW (ref 3.5–5.0)
Alkaline Phosphatase: 72 U/L (ref 38–126)
Anion gap: 8 (ref 5–15)
BUN: 15 mg/dL (ref 8–23)
CO2: 28 mmol/L (ref 22–32)
Calcium: 8.4 mg/dL — ABNORMAL LOW (ref 8.9–10.3)
Chloride: 100 mmol/L (ref 98–111)
Creatinine, Ser: 0.92 mg/dL (ref 0.61–1.24)
GFR calc Af Amer: >60 mL/min (ref >60)
GFR calc non Af Amer: >60 mL/min (ref >60)
Glucose, Bld: 116 mg/dL — ABNORMAL HIGH (ref 70–99)
Potassium: 4.3 mmol/L (ref 3.5–5.1)
Sodium: 136 mmol/L (ref 135–145)
Total Bilirubin: 0.7 mg/dL (ref 0.3–1.2)
Total Protein: 7 g/dL (ref 6.5–8.1)

## 2019-01-18 LAB — SARS CORONAVIRUS 2 BY RT PCR (HOSPITAL ORDER, PERFORMED IN ~~LOC~~ HOSPITAL LAB): SARS Coronavirus 2: NEGATIVE

## 2019-01-18 LAB — ETHANOL: Alcohol, Ethyl (B): <10 mg/dL (ref <10)

## 2019-01-18 LAB — LIPASE, BLOOD: Lipase: 29 U/L (ref 11–51)

## 2019-01-18 MED ORDER — ONDANSETRON HCL 4 MG/2ML IJ SOLN: 4.0000 mg | Freq: Once | INTRAMUSCULAR | Status: DC

## 2019-01-18 MED ORDER — SODIUM CHLORIDE 0.9 % IV BOLUS: 500.0000 mL | Freq: Once | INTRAVENOUS | Status: DC

## 2019-01-18 MED ORDER — DOXYCYCLINE HYCLATE 100 MG PO CAPS: 100.0000 mg | ORAL_CAPSULE | Freq: Two times a day (BID) | ORAL | 0 refills | Status: DC

## 2019-01-18 NOTE — ED Notes (Signed)
Pt refuses to wear O2 probe

## 2019-01-18 NOTE — ED Notes (Signed)
Pt given gingerale. Pt yelling he wants to go home and that he will sue this place. Pt advised that staff is working on getting him discharged. RN aware.

## 2019-01-18 NOTE — ED Triage Notes (Signed)
Pt arrives via [facility] from [facility] for nausea x 3 weeks. Pt told the assisted living staff that he wanted to go to the hospital. Per EMS pt refused IV start in route. Pt A&O but states "I want to get out of this place, I need my lawyer".

## 2019-01-18 NOTE — ED Notes (Signed)
Pt refuses IV start, pt states "I will get a damn lawyer, I just want to go home"

## 2019-01-18 NOTE — ED Provider Notes (Signed)
[facility] Emergency Department Provider Note  ____________________________________________  Time seen: Approximately 5:32 PM  I have reviewed the triage vital signs and the nursing notes.   HISTORY  Chief Complaint Nausea    Level 5 Caveat: Portions of the History and Physical including HPI and review of systems are unable to be completely obtained due to patient being a poor historian due to being uncooperative  HPI Eric [name] is a 73 y.o. male with a history of anxiety, atrial fibrillation, CHF, hypertension who comes to the ED complaining of nausea for the past  3 weeks.  Reports decreased oral intake as well.  Denies any aggravating or alleviating factors, denies any pain or vomiting.  From arrival patient states he does not want to be here in the emergency department and wants to go home.  He refused to let EMS start an IV.  On initial interview, patient is verbally abusive to myself and other staff.  He has audible wheezing.  He denies feeling short of breath or having any cough or sick contacts.   Past Medical History:  Diagnosis Date  . Acute respiratory failure with hypoxia ([place])    multiple prior admissions to [facility] and [facility] for CHF exacerbation / respiratory failure  . Anxiety and depression   . Atrial fibrillation ([place]) 07/25/2018   new-onset during [place] admission in Jan 2020  . Cataract   . CHF (congestive heart failure) (HCC)    EF <= 50% as of Jan 2020  . Current use of long term anticoagulation 07/2018   Started on apixaban for new-onset a-fib in Jan 2020  . Dementia ([place])    possible, likely age-related, documented decreased cognitive function on prior hospitalizations  . Gallstones   . Glaucoma   . Hypertension   . Hypoglycemia   . Hypothyroidism   . Inguinal hernia   . Sleep apnea   . Thyroid disease      Patient Active Problem List   Diagnosis Date Noted  . Chronic combined systolic and diastolic CHF (congestive heart  failure) ([place]) 11/16/2018  . Anasarca 11/16/2018  . Hypothyroidism 11/16/2018  . PAF (paroxysmal atrial fibrillation) ([place]) 11/16/2018  . HTN (hypertension) 11/16/2018  . Generalized anxiety disorder 11/03/2018  . Major depression in partial remission ([place]) 11/03/2018  . Acute hypoxemic respiratory failure ([place]) 10/29/2018  . Suspected Covid-19 Virus Infection 10/29/2018  . Delirium      Past Surgical History:  Procedure Laterality Date  . FINGER AMPUTATION Right      Prior to Admission medications   Medication Sig Start Date End Date Taking? Authorizing Provider  acetaminophen (TYLENOL) 325 MG tablet Take 2 tablets (650 mg total) by mouth every 6 (six) hours as needed for mild pain (or Fever >/= 101). 11/03/18   Loletha Grayer, MD  albuterol (ACCUNEB) 0.63 MG/3ML nebulizer solution Take 1 ampule by nebulization every 6 (six) hours as needed for shortness of breath.     [provider]  ANORO ELLIPTA 62.5-25 MCG/INH AEPB Inhale 1 puff into the lungs every 6 (six) hours. 10/12/18   [provider]  bimatoprost (LUMIGAN) 0.01 % SOLN Place 1 drop into both eyes at bedtime.    [provider]  cyanocobalamin 500 MCG tablet Take 500 mcg by mouth 3 (three) times a week.    [provider]  diazepam (VALIUM) 10 MG tablet Take 1 tablet (10 mg total) by mouth every 6 (six) hours as needed for anxiety. 11/03/18   Wieting, Richard,  MD  doxycycline (VIBRAMYCIN) 100 MG capsule Take 1 capsule (100 mg total) by mouth 2 (two) times daily. 01/18/19   Sharman CheekStafford, Cristoval Teall, MD  furosemide (LASIX) 20 MG tablet Take 1 tablet (20 mg total) by mouth daily. 11/04/18   Alford HighlandWieting, Richard, MD  levothyroxine (SYNTHROID) 150 MCG tablet Take 1 tablet (150 mcg total) by mouth daily. 11/03/18 11/03/19  Alford HighlandWieting, Richard, MD  liver oil-zinc oxide (DESITIN) 40 % ointment Apply topically daily. 11/04/18   Alford HighlandWieting, Richard, MD  Melatonin 3 MG TABS Take 1 tablet by mouth daily.    [provider]  metoprolol succinate (TOPROL-XL) 25 MG 24 hr tablet Take 1 tablet (25 mg total) by mouth daily. 11/04/18   Alford HighlandWieting, Richard, MD  OLANZapine zydis (ZYPREXA) 10 MG disintegrating tablet Take 1 tablet (10 mg total) by mouth 2 (two) times daily. 11/03/18   Alford HighlandWieting, Richard, MD  senna-docusate (SENEXON-S) 8.6-50 MG tablet Take 2 tablets by mouth 2 (two) times daily.    [provider]  sertraline (ZOLOFT) 50 MG tablet Take 1 tablet (50 mg total) by mouth daily. 11/03/18   Alford HighlandWieting, Richard, MD  Vitamin D, Ergocalciferol, (DRISDOL) 1.25 MG (50000 UT) CAPS capsule Take 50,000 Units by mouth every 7 (seven) days.    [provider]     Allergies Demerol [meperidine], Epinephrine, Nardil [phenelzine], Morphine and related, Ondansetron, and Shellfish allergy   Family History  Problem Relation Age of Onset  . Alzheimer's disease Mother     Social History Social History   Tobacco Use  . Smoking status: Former Smoker    Quit date: 10/18/2010    Years since quitting: 8.2  . Smokeless tobacco: Never Used  Substance Use Topics  . Alcohol use: No  . Drug use: No    Review of Systems Level 5 Caveat: Portions of the History and Physical including HPI and review of systems are unable to be completely obtained due to patient being a poor historian   Constitutional:   No known fever.  ENT:   No rhinorrhea. Cardiovascular:   No chest pain or syncope. Respiratory:   No dyspnea or cough. Gastrointestinal:   Negative for abdominal pain, vomiting and diarrhea.  Musculoskeletal:   Negative for focal pain or swelling ____________________________________________   PHYSICAL EXAM:  VITAL SIGNS: ED Triage Vitals  Enc Vitals Group     BP 01/18/19 1637 (!) 154/88     Pulse Rate 01/18/19 1637 100     Resp --      Temp 01/18/19 1637 98.1 F (36.7 C)     Temp Source 01/18/19 1637 Oral     SpO2 01/18/19 1637 92 %     Weight 01/18/19 1633 200 lb (90.7 kg)     Height  01/18/19 1633 5\' 7"  (1.702 m)     Head Circumference --      Peak Flow --      Pain Score 01/18/19 1638 0     Pain Loc --      Pain Edu? --      Excl. in GC? --     Vital signs reviewed, nursing assessments reviewed.   Constitutional:   Alert and oriented. Non-toxic appearance. Eyes:   Conjunctivae are normal. EOMI. PERRL. ENT      Head:   Normocephalic and atraumatic.      Nose:   No congestion/rhinnorhea.       Mouth/Throat:   MMM, no pharyngeal erythema. No peritonsillar mass.       Neck:  No meningismus. Full ROM. Hematological/Lymphatic/Immunilogical:   No cervical lymphadenopathy. Cardiovascular:   RRR. Symmetric bilateral radial and DP pulses.  No murmurs. Cap refill less than 2 seconds. Respiratory:   Mild tachypnea.  Diminished breath sounds diffusely with expiratory wheezing and prolonged expiratory phase. Gastrointestinal:   Soft and nontender. Non distended. There is no CVA tenderness.  No rebound, rigidity, or guarding.  Musculoskeletal:   Normal range of motion in all extremities. No joint effusions.  No lower extremity tenderness.  Trace bilateral lower extremity edema. Neurologic:   Normal speech and language.  Motor grossly intact. No acute focal neurologic deficits are appreciated.  Skin:    Skin is warm, dry and intact. No rash noted.  No petechiae, purpura, or bullae.  Brawny skin thickening over bilateral lower extremities consistent with venous stasis disease.  ____________________________________________    LABS (pertinent positives/negatives) (all labs ordered are listed, but only abnormal results are displayed) Labs Reviewed  COMPREHENSIVE METABOLIC PANEL - Abnormal; Notable for the following components:      Result Value   Glucose, Bld 116 (*)    Calcium 8.4 (*)    Albumin 3.4 (*)    AST 13 (*)    All other components within normal limits  CBC WITH DIFFERENTIAL/PLATELET - Abnormal; Notable for the following components:   WBC 10.6 (*)    Neutro  Abs 9.1 (*)    Lymphs Abs 0.5 (*)    All other components within normal limits  SARS CORONAVIRUS 2 (HOSPITAL ORDER, PERFORMED IN [facility])  LIPASE, BLOOD  ETHANOL   ____________________________________________   EKG    ____________________________________________    RADIOLOGY  No results found.  ____________________________________________   PROCEDURES Procedures  ____________________________________________  DIFFERENTIAL DIAGNOSIS   Pneumonia, pulmonary edema, pleural effusion, electrolyte disturbance, dehydration  CLINICAL IMPRESSION / ASSESSMENT AND PLAN / ED COURSE  Pertinent labs & imaging results that were available during my care of the patient were reviewed by me and considered in my medical decision making (see chart for details).   Kary KosDaniel Augspurger [name] was evaluated in Emergency Department on 01/18/2019 for the symptoms described in the history of present illness. He was evaluated in the context of the global COVID-19 pandemic, which necessitated consideration that the patient might be at risk for infection with the SARS-CoV-2 virus that causes COVID-19. Institutional protocols and algorithms that pertain to the evaluation of patients at risk for COVID-19 are in a state of rapid change based on information released by regulatory bodies including the CDC and federal and state organizations. These policies and algorithms were followed during the patient's care in the ED.   Patient presents with subacute nausea.  He is obese, elderly with multiple medical problems.  Advised a chest x-ray, lab tests, and coronavirus testing to the patient.  His response was to verbally abuse myself and staff and accuse us of trying to exploit him financially.  I explained that I am worried about pneumonia or other serious conditions that may require careful treatment to which she responds "I do not have no damn pneumonia."  He is oriented and appears to have medical  decision-making capacity.  He is not psychotic or suicidal.  He initially agreed to allow me to pursue an initial work-up.  However, he later refused to have the chest x-ray done when radiology came to perform the test.  On discussing with the patient once again he demands to be discharged immediately.  He will be discharged AGAINST MEDICAL ADVICE.  Nurse Carollee HerterShannon got him in touch with the nurse back at his residential facility, and we have called an ambulance to transport him home.  Given the limitations of work-up, I am prescribing him doxycycline for possible community-acquired pneumonia.      ____________________________________________   FINAL CLINICAL IMPRESSION(S) / ED DIAGNOSES    Final diagnoses:  Nausea  Wheezing     ED Discharge Orders         Ordered    doxycycline (VIBRAMYCIN) 100 MG capsule  2 times daily     01/18/19 1731          Portions of this note were generated with dragon dictation software. Dictation errors may occur despite best attempts at proofreading.   Sharman CheekStafford, Daaron Dimarco, MD 01/18/19 [phone]

## 2019-01-18 NOTE — ED Notes (Signed)
RN called [facility] to request transportation if available. Aleta RN verified pt will need to go back via EMS. Pt updated we are waiting for an ambulance.

## 2019-01-18 NOTE — ED Notes (Addendum)
This RN called to update the [facility] that pt is not willing to receive any further treatment. Nigel Berthold, RN from the [facility] attempted to talk to patient but pt refused to stay or talk to facility or this RN any longer. MD at bedside to talk to patient as well but pt is sitting on edge of bed refusing to stay any longer. PT cussing at staff and stating he is not being treated well in this ED.

## 2019-01-18 NOTE — ED Notes (Signed)
Pt refused to let me take vitals before transport

## 2019-01-18 NOTE — ED Notes (Signed)
Pt sitting on bed asking when he is going to get to go home, advised when ambulance arrives they will take him home

## 2019-01-18 NOTE — ED Notes (Signed)
Pt refused to sign AMA form.

## 2019-01-31 ENCOUNTER — Encounter: Payer: Self-pay | Admitting: *Deleted

## 2019-01-31 ENCOUNTER — Other Ambulatory Visit: Payer: Self-pay

## 2019-01-31 ENCOUNTER — Emergency Department: Payer: Medicare Other

## 2019-01-31 ENCOUNTER — Emergency Department
Admission: EM | Admit: 2019-01-31 | Discharge: 2019-01-31 | Disposition: A | Discharge status: Home / Self Care | Payer: Medicare Other | Attending: Emergency Medicine | Admitting: Emergency Medicine

## 2019-01-31 DIAGNOSIS — I110 Hypertensive heart disease with heart failure: Secondary | ICD-10-CM | POA: Insufficient documentation

## 2019-01-31 DIAGNOSIS — R0600 Dyspnea, unspecified: Secondary | ICD-10-CM

## 2019-01-31 DIAGNOSIS — R0602 Shortness of breath: Secondary | ICD-10-CM | POA: Diagnosis present

## 2019-01-31 DIAGNOSIS — I5042 Chronic combined systolic (congestive) and diastolic (congestive) heart failure: Secondary | ICD-10-CM | POA: Diagnosis not present

## 2019-01-31 DIAGNOSIS — E039 Hypothyroidism, unspecified: Secondary | ICD-10-CM | POA: Insufficient documentation

## 2019-01-31 DIAGNOSIS — F0390 Unspecified dementia without behavioral disturbance: Secondary | ICD-10-CM | POA: Diagnosis not present

## 2019-01-31 DIAGNOSIS — Z79899 Other long term (current) drug therapy: Secondary | ICD-10-CM | POA: Diagnosis not present

## 2019-01-31 DIAGNOSIS — Z87891 Personal history of nicotine dependence: Secondary | ICD-10-CM | POA: Insufficient documentation

## 2019-01-31 DIAGNOSIS — I509 Heart failure, unspecified: Secondary | ICD-10-CM | POA: Diagnosis not present

## 2019-01-31 DIAGNOSIS — Z20828 Contact with and (suspected) exposure to other viral communicable diseases: Secondary | ICD-10-CM | POA: Insufficient documentation

## 2019-01-31 LAB — COMPREHENSIVE METABOLIC PANEL
ALT: 11 U/L (ref 0–44)
AST: 13 U/L — ABNORMAL LOW (ref 15–41)
Albumin: 3.7 g/dL (ref 3.5–5.0)
Alkaline Phosphatase: 70 U/L (ref 38–126)
Anion gap: 3 — ABNORMAL LOW (ref 5–15)
BUN: 14 mg/dL (ref 8–23)
CO2: 31 mmol/L (ref 22–32)
Calcium: 8.1 mg/dL — ABNORMAL LOW (ref 8.9–10.3)
Chloride: 97 mmol/L — ABNORMAL LOW (ref 98–111)
Creatinine, Ser: 0.77 mg/dL (ref 0.61–1.24)
GFR calc Af Amer: >60 mL/min (ref >60)
GFR calc non Af Amer: >60 mL/min (ref >60)
Glucose, Bld: 117 mg/dL — ABNORMAL HIGH (ref 70–99)
Potassium: 4.1 mmol/L (ref 3.5–5.1)
Sodium: 131 mmol/L — ABNORMAL LOW (ref 135–145)
Total Bilirubin: 1.3 mg/dL — ABNORMAL HIGH (ref 0.3–1.2)
Total Protein: 7.2 g/dL (ref 6.5–8.1)

## 2019-01-31 LAB — CBC WITH DIFFERENTIAL/PLATELET
Abs Immature Granulocytes: 0.06 10*3/uL (ref 0.00–0.07)
Basophils Absolute: 0 10*3/uL (ref 0.0–0.1)
Basophils Relative: 0 %
Eosinophils Absolute: 0.2 10*3/uL (ref 0.0–0.5)
Eosinophils Relative: 2 %
HCT: 43.5 % (ref 39.0–52.0)
Hemoglobin: 14.1 g/dL (ref 13.0–17.0)
Immature Granulocytes: 1 %
Lymphocytes Relative: 5 %
Lymphs Abs: 0.5 10*3/uL — ABNORMAL LOW (ref 0.7–4.0)
MCH: 29.5 pg (ref 26.0–34.0)
MCHC: 32.4 g/dL (ref 30.0–36.0)
MCV: 91 fL (ref 80.0–100.0)
Monocytes Absolute: 0.8 10*3/uL (ref 0.1–1.0)
Monocytes Relative: 7 %
Neutro Abs: 9.5 10*3/uL — ABNORMAL HIGH (ref 1.7–7.7)
Neutrophils Relative %: 85 %
Platelets: 181 10*3/uL (ref 150–400)
RBC: 4.78 MIL/uL (ref 4.22–5.81)
RDW: 13.2 % (ref 11.5–15.5)
WBC: 11 10*3/uL — ABNORMAL HIGH (ref 4.0–10.5)
nRBC: 0 % (ref 0.0–0.2)

## 2019-01-31 LAB — URINALYSIS, COMPLETE (UACMP) WITH MICROSCOPIC
Bacteria, UA: NONE SEEN
Bilirubin Urine: NEGATIVE
Glucose, UA: NEGATIVE mg/dL
Hgb urine dipstick: NEGATIVE
Ketones, ur: NEGATIVE mg/dL
Leukocytes,Ua: NEGATIVE
Nitrite: NEGATIVE
Protein, ur: NEGATIVE mg/dL
Specific Gravity, Urine: 1.011 (ref 1.005–1.030)
Squamous Epithelial / HPF: NONE SEEN (ref 0–5)
pH: 7 (ref 5.0–8.0)

## 2019-01-31 LAB — SARS CORONAVIRUS 2 BY RT PCR (HOSPITAL ORDER, PERFORMED IN ~~LOC~~ HOSPITAL LAB): SARS Coronavirus 2: NEGATIVE

## 2019-01-31 LAB — BRAIN NATRIURETIC PEPTIDE: B Natriuretic Peptide: 34 pg/mL (ref 0.0–100.0)

## 2019-01-31 LAB — TROPONIN I (HIGH SENSITIVITY): Troponin I (High Sensitivity): 4 ng/L (ref <18)

## 2019-01-31 MED ORDER — CEPHALEXIN 500 MG PO CAPS: 500.0000 mg | ORAL_CAPSULE | Freq: Three times a day (TID) | ORAL | 0 refills | Status: AC

## 2019-01-31 MED ORDER — TETANUS-DIPHTH-ACELL PERTUSSIS 5-2.5-18.5 LF-MCG/0.5 IM SUSP
0.5000 mL | Freq: Once | INTRAMUSCULAR | Status: DC
  Filled 2019-01-31: qty 0.5

## 2019-01-31 MED ORDER — FUROSEMIDE 10 MG/ML IJ SOLN
20.0000 mg | Freq: Once | INTRAMUSCULAR | Status: AC
  Administered 2019-01-31: 20 mg via INTRAVENOUS
  Filled 2019-01-31: qty 4

## 2019-01-31 NOTE — ED Triage Notes (Signed)
Pt here vis EMS from [place] [facility]. Pt is noncompliant and refuses to take medication or wear his oxygen. Pt is unhappy and doesn't want to be here. Per EMS facility states he is A&O x 4 . Pt continues to state he doesn't want to stay and he is scared.

## 2019-01-31 NOTE — ED Notes (Signed)
Legs with a large amt of fluid. Pt has been refusing lasix at facility

## 2019-01-31 NOTE — ED Notes (Signed)
Left pointer finger cleansed and xeroform and bandaid applied

## 2019-01-31 NOTE — Discharge Instructions (Signed)
Please be sure to take your Lasix as directed.  Please return for any further problems.  Additionally I will give you some Keflex antibiotic 1 pill 3 times a day for the redness in your legs.  Please return here if that gets any worse or does not improve.  Please follow-up with your regular doctor within the week.

## 2019-01-31 NOTE — ED Provider Notes (Addendum)
[facility] Emergency Department Provider Note   ____________________________________________   First MD Initiated Contact with Patient 01/31/19 2218     (approximate)  I have reviewed the triage vital signs and the nursing notes.   HISTORY  Chief Complaint Shortness of Breath    HPI Eric [name] is a 73 y.o. male who comes in complaining of shortness of breath.  He says he does not want to be here.  [place] made him come.  He is very anxious and slightly aggressive.  He denies any chest pain fever or coughing or any other problems.  And then he says he cannot cough up any phlegm and he needs to cough.  He says he takes Lasix and his legs are swelling.  [facility].        Past Medical History:  Diagnosis Date  . Acute respiratory failure with hypoxia (HCC)    multiple prior admissions to [facility] and [facility] for CHF exacerbation / respiratory failure  . Anxiety and depression   . Atrial fibrillation (HCC) 07/25/2018   new-onset during [facility] admission in Jan 2020  . Cataract   . CHF (congestive heart failure) (HCC)    EF <= 50% as of Jan 2020  . Current use of long term anticoagulation 07/2018   Started on apixaban for new-onset a-fib in Jan 2020  . Dementia (HCC)    possible, likely age-related, documented decreased cognitive function on prior hospitalizations  . Gallstones   . Glaucoma   . Hypertension   . Hypoglycemia   . Hypothyroidism   . Inguinal hernia   . Sleep apnea   . Thyroid disease     Patient Active Problem List   Diagnosis Date Noted  . Chronic combined systolic and diastolic CHF (congestive heart failure) (HCC) 11/16/2018  . Anasarca 11/16/2018  . Hypothyroidism 11/16/2018  . PAF (paroxysmal atrial fibrillation) (HCC) 11/16/2018  . HTN (hypertension) 11/16/2018  . Generalized anxiety disorder 11/03/2018  . Major depression in partial remission (HCC) 11/03/2018  . Acute  hypoxemic respiratory failure (HCC) 10/29/2018  . Suspected Covid-19 Virus Infection 10/29/2018  . Delirium     Past Surgical History:  Procedure Laterality Date  . FINGER AMPUTATION Right     Prior to Admission medications   Medication Sig Start Date End Date Taking? Authorizing Provider  acetaminophen (TYLENOL) 325 MG tablet Take 2 tablets (650 mg total) by mouth every 6 (six) hours as needed for mild pain (or Fever >/= 101). 11/03/18   Alford HighlandWieting, Richard, MD  albuterol (ACCUNEB) 0.63 MG/3ML nebulizer solution Take 1 ampule by nebulization every 6 (six) hours as needed for shortness of breath.     [provider]  ANORO ELLIPTA 62.5-25 MCG/INH AEPB Inhale 1 puff into the lungs every 6 (six) hours. 10/12/18   [provider]  bimatoprost (LUMIGAN) 0.01 % SOLN Place 1 drop into both eyes at bedtime.    [provider]  cephALEXin (KEFLEX) 500 MG capsule Take 1 capsule (500 mg total) by mouth 3 (three) times daily for 10 days. 01/31/19 02/10/19  Arnaldo NatalMalinda,  F, MD  cyanocobalamin 500 MCG tablet Take 500 mcg by mouth 3 (three) times a week.    [provider]  diazepam (VALIUM) 10 MG tablet Take 1 tablet (10 mg total) by mouth every 6 (six) hours as needed for anxiety. 11/03/18   Alford HighlandWieting, Richard, MD  doxycycline (VIBRAMYCIN) 100 MG capsule Take  1 capsule (100 mg total) by mouth 2 (two) times daily. 01/18/19   Sharman CheekStafford, Phillip, MD  furosemide (LASIX) 20 MG tablet Take 1 tablet (20 mg total) by mouth daily. 11/04/18   Alford HighlandWieting, Richard, MD  levothyroxine (SYNTHROID) 150 MCG tablet Take 1 tablet (150 mcg total) by mouth daily. 11/03/18 11/03/19  Alford HighlandWieting, Richard, MD  liver oil-zinc oxide (DESITIN) 40 % ointment Apply topically daily. 11/04/18   Alford HighlandWieting, Richard, MD  Melatonin 3 MG TABS Take 1 tablet by mouth daily.    [provider]  metoprolol succinate (TOPROL-XL) 25 MG 24 hr tablet Take 1 tablet (25 mg total) by mouth daily. 11/04/18   Alford HighlandWieting, Richard, MD   OLANZapine zydis (ZYPREXA) 10 MG disintegrating tablet Take 1 tablet (10 mg total) by mouth 2 (two) times daily. 11/03/18   Alford HighlandWieting, Richard, MD  senna-docusate (SENEXON-S) 8.6-50 MG tablet Take 2 tablets by mouth 2 (two) times daily.    [provider]  sertraline (ZOLOFT) 50 MG tablet Take 1 tablet (50 mg total) by mouth daily. 11/03/18   Alford HighlandWieting, Richard, MD  Vitamin D, Ergocalciferol, (DRISDOL) 1.25 MG (50000 UT) CAPS capsule Take 50,000 Units by mouth every 7 (seven) days.    [provider]    Allergies Demerol [meperidine], Epinephrine, Nardil [phenelzine], Morphine and related, Ondansetron, and Shellfish allergy  Family History  Problem Relation Age of Onset  . Alzheimer's disease Mother     Social History Social History   Tobacco Use  . Smoking status: Former Smoker    Quit date: 10/18/2010    Years since quitting: 8.2  . Smokeless tobacco: Never Used  Substance Use Topics  . Alcohol use: No  . Drug use: No    Review of Systems  Constitutional: No fever/chills Eyes: No visual changes. ENT: No sore throat. Cardiovascular: Denies chest pain. Respiratory:shortness of breath. Gastrointestinal: No abdominal pain.  No nausea, no vomiting.  No diarrhea.  No constipation. Genitourinary: Negative for dysuria. Musculoskeletal: Negative for back pain. Skin: Negative for rash. Neurological: Negative for headaches, focal weakness   ____________________________________________   PHYSICAL EXAM:  VITAL SIGNS: ED Triage Vitals  Enc Vitals Group     BP 01/31/19 2205 (!) 176/113     Pulse Rate 01/31/19 2205 88     Resp 01/31/19 2205 (!) 26     Temp 01/31/19 2205 98.6 F (37 C)     Temp Source 01/31/19 2205 Oral     SpO2 01/31/19 2205 94 %     Weight 01/31/19 2208 180 lb (81.6 kg)     Height 01/31/19 2208 5\' 6"  (1.676 m)     Head Circumference --      Peak Flow --      Pain Score 01/31/19 2208 0     Pain Loc --      Pain Edu? --      Excl. in GC? --      Constitutional: Alert and oriented to person and hospital.  Anxious and somewhat confused as to why he is here and what we are doing.  He needs frequent explanation and re-explanation and redirecting Eyes: Conjunctivae are normal.  Head: Atraumatic. Nose: No congestion/rhinnorhea. Mouth/Throat: Mucous membranes are moist.  Oropharynx non-erythematous. Neck: No stridor.   Cardiovascular: Normal rate, regular rhythm. Grossly normal heart sounds.  Good peripheral circulation. Respiratory: Normal respiratory effort.  No retractions. Lungs some crackles in bases Gastrointestinal: Soft and nontender. No distention. No abdominal bruits. No CVA tenderness. Musculoskeletal: Bilateral lower leg edema with  redness warmth and some tenderness Neurologic:  Normal speech and language. No gross focal neurologic deficits are appreciated. Skin: See above musculoskeletal   ____________________________________________   LABS (all labs ordered are listed, but only abnormal results are displayed)  Labs Reviewed  COMPREHENSIVE METABOLIC PANEL - Abnormal; Notable for the following components:      Result Value   Sodium 131 (*)    Chloride 97 (*)    Glucose, Bld 117 (*)    Calcium 8.1 (*)    AST 13 (*)    Total Bilirubin 1.3 (*)    Anion gap 3 (*)    All other components within normal limits  CBC WITH DIFFERENTIAL/PLATELET - Abnormal; Notable for the following components:   WBC 11.0 (*)    Neutro Abs 9.5 (*)    Lymphs Abs 0.5 (*)    All other components within normal limits  URINALYSIS, COMPLETE (UACMP) WITH MICROSCOPIC - Abnormal; Notable for the following components:   Color, Urine STRAW (*)    APPearance CLEAR (*)    All other components within normal limits  SARS CORONAVIRUS 2 (HOSPITAL ORDER, [facility] [facility])  BRAIN NATRIURETIC PEPTIDE  TROPONIN I (HIGH SENSITIVITY)   ____________________________________________  EKG  EKG read and interpreted by me shows normal  sinus rhythm rate of 89 normal axis no acute ST-T wave changes ____________________________________________  RADIOLOGY  ED MD interpretation: Chest x-ray read by radiology is stable.  There may be a little element of CHF.  Official radiology report(s): Dg Chest Portable 1 View  Result Date: 01/31/2019 CLINICAL DATA:  73 year old male with shortness of breath. EXAM: PORTABLE CHEST 1 VIEW COMPARISON:  11/15/2018 portable chest and earlier. FINDINGS: Portable AP upright view at 2208 hours. Stable to mildly improved lung volumes. Stable cardiomegaly and mediastinal contours. Visualized tracheal air column is within normal limits. Stable basilar predominant increased pulmonary interstitial markings. No pneumothorax, pleural effusion or acute pulmonary opacity. Severe degenerative changes at the right shoulder. No acute osseous abnormality identified. IMPRESSION: Stable.  No acute cardiopulmonary abnormality. Electronically Signed   By: Genevie Ann M.D.   On: 01/31/2019 22:34    ____________________________________________   PROCEDURES  Procedure(s) performed (including Critical Care):  Procedures   ____________________________________________   INITIAL IMPRESSION / ASSESSMENT AND PLAN / ED COURSE Clinically patient has an element of CHF as well as anxiety.  Patient reports that 850 cc from the Lasix.  He is feeling better and really wants to go home.  His sats have never been low.  They are better now.  I will let him go home.  Additionally I will give him some Keflex as he has some redness in both legs and this could be early cellulitis.  I will have him return for any further problems              ____________________________________________   FINAL CLINICAL IMPRESSION(S) / ED DIAGNOSES  Final diagnoses:  SOB (shortness of breath)  Dyspnea, unspecified type  Acute on chronic congestive heart failure, unspecified heart failure type [facility])     ED Discharge Orders          Ordered    cephALEXin (KEFLEX) 500 MG capsule  3 times daily     01/31/19 2324           Note:  This document was prepared using Dragon voice recognition software and may include unintentional dictation errors.    Nena Polio, MD 01/31/19 2248    Nena Polio,  MD 01/31/19 [id number]    Arnaldo NatalMalinda,  F, MD 01/31/19 2326

## 2019-03-04 ENCOUNTER — Emergency Department
Admission: EM | Admit: 2019-03-04 | Discharge: 2019-03-04 | Discharge status: Against Medical Advice | Payer: Medicare Other | Attending: Emergency Medicine | Admitting: Emergency Medicine

## 2019-03-04 ENCOUNTER — Encounter: Payer: Self-pay | Admitting: Emergency Medicine

## 2019-03-04 ENCOUNTER — Other Ambulatory Visit: Payer: Self-pay

## 2019-03-04 DIAGNOSIS — E039 Hypothyroidism, unspecified: Secondary | ICD-10-CM | POA: Insufficient documentation

## 2019-03-04 DIAGNOSIS — Z79899 Other long term (current) drug therapy: Secondary | ICD-10-CM | POA: Insufficient documentation

## 2019-03-04 DIAGNOSIS — Z87891 Personal history of nicotine dependence: Secondary | ICD-10-CM | POA: Insufficient documentation

## 2019-03-04 DIAGNOSIS — F0390 Unspecified dementia without behavioral disturbance: Secondary | ICD-10-CM | POA: Insufficient documentation

## 2019-03-04 DIAGNOSIS — F419 Anxiety disorder, unspecified: Secondary | ICD-10-CM | POA: Diagnosis not present

## 2019-03-04 DIAGNOSIS — I872 Venous insufficiency (chronic) (peripheral): Secondary | ICD-10-CM | POA: Insufficient documentation

## 2019-03-04 DIAGNOSIS — I110 Hypertensive heart disease with heart failure: Secondary | ICD-10-CM | POA: Insufficient documentation

## 2019-03-04 DIAGNOSIS — R2243 Localized swelling, mass and lump, lower limb, bilateral: Secondary | ICD-10-CM | POA: Diagnosis present

## 2019-03-04 DIAGNOSIS — I5042 Chronic combined systolic (congestive) and diastolic (congestive) heart failure: Secondary | ICD-10-CM | POA: Diagnosis not present

## 2019-03-04 NOTE — ED Notes (Signed)
Unable to contact son to take pt back to [facility]. Will contact ems to take pt back to facility. Pt refusing treatment at this time. MD Karma Greaser at bedside. Pt refusing any tx and states "I want to go home, I cant stay here. I feel better I want to go home".

## 2019-03-04 NOTE — Discharge Instructions (Signed)
After coming to the emergency department, you refused additional treatment and wanted to go home.  It does not appear you have an emergent medical condition at this time so we are honoring your request.  Please follow-up with your primary care doctor and return immediately to the emergency department if you develop new or worsening symptoms.

## 2019-03-04 NOTE — ED Triage Notes (Signed)
Pt presents from the [facility] [place] with c/o bilateral leg pain for 2 hours. Hx of edema in bilateral extremities and DVT. Pt refused any tx or vital signs for ems.

## 2019-03-04 NOTE — ED Notes (Signed)
Pt refusing D/C vitals or to sign for D/C. Pt was instructed by MD Karma Greaser to return to ED if needed. Pt still wishing to leave at this time. Pt verbalizes understanding that he is leaving against medical advice.

## 2019-03-04 NOTE — ED Provider Notes (Signed)
[facility] Emergency Department Provider Note  ____________________________________________   First MD Initiated Contact with Patient 03/04/19 0150     (approximate)  I have reviewed the triage vital signs and the nursing notes.   HISTORY  Chief Complaint Leg Pain  History is limited by patient's lack of cooperation.  HPI Eric [name] is a 73 y.o. male with medical history as listed below who presents by EMS for bilateral leg swelling and pain.  By the time he arrived he was saying he did not want to be here and that he wanted to go home.  He said that his legs hurt a little bit and that they are always swollen but now they feel better and he wants to get out of here.  He denies chest pain or shortness of breath.  He admits to being anxious.  He continually refuses any care or additional evaluation.         Past Medical History:  Diagnosis Date  . Acute respiratory failure with hypoxia (HCC)    multiple prior admissions to [facility] and [facility] for CHF exacerbation / respiratory failure  . Anxiety and depression   . Atrial fibrillation (HCC) 07/25/2018   new-onset during [facility] admission in Jan 2020  . Cataract   . CHF (congestive heart failure) (HCC)    EF <= 50% as of Jan 2020  . Current use of long term anticoagulation 07/2018   Started on apixaban for new-onset a-fib in Jan 2020  . Dementia (HCC)    possible, likely age-related, documented decreased cognitive function on prior hospitalizations  . Gallstones   . Glaucoma   . Hypertension   . Hypoglycemia   . Hypothyroidism   . Inguinal hernia   . Sleep apnea   . Thyroid disease     Patient Active Problem List   Diagnosis Date Noted  . Chronic combined systolic and diastolic CHF (congestive heart failure) (HCC) 11/16/2018  . Anasarca 11/16/2018  . Hypothyroidism 11/16/2018  . PAF (paroxysmal atrial fibrillation) (HCC) 11/16/2018  . HTN (hypertension) 11/16/2018  . Generalized anxiety  disorder 11/03/2018  . Major depression in partial remission (HCC) 11/03/2018  . Acute hypoxemic respiratory failure (HCC) 10/29/2018  . Suspected Covid-19 Virus Infection 10/29/2018  . Delirium     Past Surgical History:  Procedure Laterality Date  . FINGER AMPUTATION Right     Prior to Admission medications   Medication Sig Start Date End Date Taking? Authorizing Provider  acetaminophen (TYLENOL) 325 MG tablet Take 2 tablets (650 mg total) by mouth every 6 (six) hours as needed for mild pain (or Fever >/= 101). 11/03/18   Alford HighlandWieting, Richard, MD  albuterol (ACCUNEB) 0.63 MG/3ML nebulizer solution Take 1 ampule by nebulization every 6 (six) hours as needed for shortness of breath.     [provider]  ANORO ELLIPTA 62.5-25 MCG/INH AEPB Inhale 1 puff into the lungs every 6 (six) hours. 10/12/18   [provider]  bimatoprost (LUMIGAN) 0.01 % SOLN Place 1 drop into both eyes at bedtime.    [provider]  cyanocobalamin 500 MCG tablet Take 500 mcg by mouth 3 (three) times a week.    [provider]  diazepam (VALIUM) 10 MG tablet Take 1 tablet (10 mg total) by mouth every 6 (six) hours as needed for anxiety. 11/03/18   Alford HighlandWieting, Richard, MD  doxycycline (VIBRAMYCIN) 100 MG capsule Take 1 capsule (100 mg total) by mouth 2 (two) times daily. 01/18/19   Sharman CheekStafford, Phillip,  MD  furosemide (LASIX) 20 MG tablet Take 1 tablet (20 mg total) by mouth daily. 11/04/18   Loletha Grayer, MD  levothyroxine (SYNTHROID) 150 MCG tablet Take 1 tablet (150 mcg total) by mouth daily. 11/03/18 11/03/19  Loletha Grayer, MD  liver oil-zinc oxide (DESITIN) 40 % ointment Apply topically daily. 11/04/18   Loletha Grayer, MD  Melatonin 3 MG TABS Take 1 tablet by mouth daily.    [provider]  metoprolol succinate (TOPROL-XL) 25 MG 24 hr tablet Take 1 tablet (25 mg total) by mouth daily. 11/04/18   Loletha Grayer, MD  OLANZapine zydis (ZYPREXA) 10 MG disintegrating tablet Take 1  tablet (10 mg total) by mouth 2 (two) times daily. 11/03/18   Loletha Grayer, MD  senna-docusate (SENEXON-S) 8.6-50 MG tablet Take 2 tablets by mouth 2 (two) times daily.    [provider]  sertraline (ZOLOFT) 50 MG tablet Take 1 tablet (50 mg total) by mouth daily. 11/03/18   Loletha Grayer, MD  Vitamin D, Ergocalciferol, (DRISDOL) 1.25 MG (50000 UT) CAPS capsule Take 50,000 Units by mouth every 7 (seven) days.    [provider]    Allergies Demerol [meperidine], Epinephrine, Nardil [phenelzine], Morphine and related, Ondansetron, and Shellfish allergy  Family History  Problem Relation Age of Onset  . Alzheimer's disease Mother     Social History Social History   Tobacco Use  . Smoking status: Former Smoker    Quit date: 10/18/2010    Years since quitting: 8.3  . Smokeless tobacco: Never Used  Substance Use Topics  . Alcohol use: No  . Drug use: No    Review of Systems History is limited by patient's lack of cooperation.  Constitutional: No fever/chills Cardiovascular: Denies chest pain. Respiratory: Denies shortness of breath. Gastrointestinal: No abdominal pain.   Musculoskeletal: Chronic leg swelling and some acute on chronic leg pain bilaterally. Integumentary: Chronic leg swelling and skin thickening.  Neurological: Negative for headaches, focal weakness or numbness.   ____________________________________________   PHYSICAL EXAM:  VITAL SIGNS: ED Triage Vitals  Enc Vitals Group     BP 03/04/19 0147 (!) 156/89     Pulse Rate 03/04/19 0147 98     Resp 03/04/19 0147 16     Temp 03/04/19 0147 98.2 F (36.8 C)     Temp Source 03/04/19 0147 Oral     SpO2 03/04/19 0147 92 %     Weight 03/04/19 0148 81.6 kg (179 lb 14.3 oz)     Height 03/04/19 0148 1.575 m (5\' 2" )     Head Circumference --      Peak Flow --      Pain Score 03/04/19 0148 6     Pain Loc --      Pain Edu? --      Excl. in [place]? --     Constitutional: Alert and oriented.   Hard of hearing.  Very anxious. Eyes: Conjunctivae are normal.  Head: Atraumatic. Cardiovascular: Normal rate, regular rhythm. Good peripheral circulation. Grossly normal heart sounds. Respiratory: Normal respiratory effort.  No retractions. Gastrointestinal: Soft and nontender. No distention.  Musculoskeletal: Bilateral chronic venous stasis with skin thickening and erythema but no tenderness to palpation. Neurologic:  Normal speech and language. No gross focal neurologic deficits are appreciated.  Skin:  Skin is warm, dry and intact. Psychiatric: Mood and affect are anxious but without any signs or symptoms of emergency psychiatric condition.  Patient appears to have the capacity to make his own decisions and does not  meet criteria for involuntary commitment.  ____________________________________________   LABS (all labs ordered are listed, but only abnormal results are displayed)  Labs Reviewed - No data to display ____________________________________________  EKG  No indication for EKG ____________________________________________  RADIOLOGY I, Loleta Roseory Kamoria Lucien, personally viewed and evaluated these images (plain radiographs) as part of my medical decision making, as well as reviewing the written report by the radiologist.  ED MD interpretation: Patient refusing any work-up  Official radiology report(s): No results found.  ____________________________________________   PROCEDURES   Procedure(s) performed (including Critical Care):  Procedures   ____________________________________________   INITIAL IMPRESSION / MDM / ASSESSMENT AND PLAN / ED COURSE  As part of my medical decision making, I reviewed the following data within the electronic MEDICAL RECORD NUMBER Nursing notes reviewed and incorporated, Old chart reviewed and Notes from prior ED visits   The patient has chronic venous stasis and I verified this in the medical record.  I also verified his history of anxiety  which is been evident on multiple prior visits.  There is no sign of any emergent psychiatric medical condition and he does not meet criteria for involuntary commitment.  Given that he is his own decision maker and he is insisting that he be allowed to leave, I will discharge him but I assured him that we would do additional work-up if he wanted and that he is welcome back at any time.  He agrees with the plan.  No signs or symptoms of COVID-19.          ____________________________________________  FINAL CLINICAL IMPRESSION(S) / ED DIAGNOSES  Final diagnoses:  Chronic venous stasis dermatitis of both lower extremities  Anxiety     MEDICATIONS GIVEN DURING THIS VISIT:  Medications - No data to display   ED Discharge Orders    None      *Please note:  Eric KosDaniel Kadrmas [name] was evaluated in Emergency Department on 03/04/2019 for the symptoms described in the history of present illness. He was evaluated in the context of the global COVID-19 pandemic, which necessitated consideration that the patient might be at risk for infection with the SARS-CoV-2 virus that causes COVID-19. Institutional protocols and algorithms that pertain to the evaluation of patients at risk for COVID-19 are in a state of rapid change based on information released by regulatory bodies including the CDC and federal and state organizations. These policies and algorithms were followed during the patient's care in the ED.  Some ED evaluations and interventions may be delayed as a result of limited staffing during the pandemic.*  Note:  This document was prepared using Dragon voice recognition software and may include unintentional dictation errors.   Loleta RoseForbach, Thurston Brendlinger, MD 03/04/19 [phone]

## 2019-03-12 ENCOUNTER — Other Ambulatory Visit: Payer: Self-pay

## 2019-03-12 ENCOUNTER — Encounter: Payer: Self-pay | Admitting: *Deleted

## 2019-03-12 ENCOUNTER — Emergency Department
Admission: EM | Admit: 2019-03-12 | Discharge: 2019-03-12 | Disposition: A | Discharge status: Home / Self Care | Payer: Medicare Other | Attending: Emergency Medicine | Admitting: Emergency Medicine

## 2019-03-12 DIAGNOSIS — I5042 Chronic combined systolic (congestive) and diastolic (congestive) heart failure: Secondary | ICD-10-CM | POA: Insufficient documentation

## 2019-03-12 DIAGNOSIS — Z139 Encounter for screening, unspecified: Secondary | ICD-10-CM | POA: Diagnosis not present

## 2019-03-12 DIAGNOSIS — Z79899 Other long term (current) drug therapy: Secondary | ICD-10-CM | POA: Insufficient documentation

## 2019-03-12 DIAGNOSIS — Z87891 Personal history of nicotine dependence: Secondary | ICD-10-CM | POA: Diagnosis not present

## 2019-03-12 DIAGNOSIS — E039 Hypothyroidism, unspecified: Secondary | ICD-10-CM | POA: Insufficient documentation

## 2019-03-12 DIAGNOSIS — I110 Hypertensive heart disease with heart failure: Secondary | ICD-10-CM | POA: Diagnosis not present

## 2019-03-12 DIAGNOSIS — F0390 Unspecified dementia without behavioral disturbance: Secondary | ICD-10-CM | POA: Diagnosis not present

## 2019-03-12 DIAGNOSIS — R0602 Shortness of breath: Secondary | ICD-10-CM | POA: Diagnosis present

## 2019-03-12 NOTE — ED Notes (Addendum)
Pt insistent that he is going home to [facility]. Waiting for Taxi. No shortness of breath noted

## 2019-03-12 NOTE — ED Triage Notes (Signed)
Pt arrived via EMS from [place] [facility]. Wears 2L O2 but has not been keeping it on.He is noncompliant and paranoid and sure someone wants to hurt him. Repeatedly says "don't hurt me" "don't kill me" Refuses all treatment. O2 is 94% on RA

## 2019-03-12 NOTE — ED Notes (Signed)
Nurse at the [facility] was called- notified of pts pending discharge and that he would be taking taxi back.

## 2019-03-12 NOTE — ED Provider Notes (Signed)
[facility] Emergency Department Provider Note   ____________________________________________   First MD Initiated Contact with Patient 03/12/19 [phone]     (approximate)  I have reviewed the triage vital signs and the nursing notes.   HISTORY  Chief Complaint Shortness of Breath  EM caveat: None patient cooperates with some but not all portions of her history  HPI Eric [name] is a 73 y.o. male has a history of CHF, A. fib, hypertension   Here for evaluation, evidently was checked on by staff this morning and refuses to wear his oxygen.  He tells me he wants to go back to his assisted living, he reports that he does not like wearing oxygen he knows his oxygen levels can go low, but he is not going to wear it because it is uncomfortable.  Patient tells me that he wants to be discharged, he does not want to be seen in the emergency room tonight.  I discussed with him, and he is alert, he knows it is 2020, he knows what August, he is aware of where he is at in his situation.  He absolutely refuses medical examination  Tells me he has not been sick recently.  He is not short of breath.  Tells me he just does not like wearing his oxygen, and he does not wish to be here  Past Medical History:  Diagnosis Date  . Acute respiratory failure with hypoxia (HCC)    [facility] and [facility] for CHF exacerbation / respiratory failure  . Anxiety and depression   . Atrial fibrillation (HCC) 07/25/2018   new-onset during [facility] admission in Jan 2020  . Cataract   . CHF (congestive heart failure) (HCC)    EF <= 50% as of Jan 2020  . Current use of long term anticoagulation 07/2018   Started on apixaban for new-onset a-fib in Jan 2020  . Dementia (HCC)    possible, likely age-related, documented decreased cognitive function on prior hospitalizations  . Gallstones   . Glaucoma   . Hypertension   . Hypoglycemia   . Hypothyroidism   . Inguinal hernia    . Sleep apnea   . Thyroid disease     Patient Active Problem List   Diagnosis Date Noted  . Chronic combined systolic and diastolic CHF (congestive heart failure) (HCC) 11/16/2018  . Anasarca 11/16/2018  . Hypothyroidism 11/16/2018  . PAF (paroxysmal atrial fibrillation) (HCC) 11/16/2018  . HTN (hypertension) 11/16/2018  . Generalized anxiety disorder 11/03/2018  . Major depression in partial remission (HCC) 11/03/2018  . Acute hypoxemic respiratory failure (HCC) 10/29/2018  . Suspected Covid-19 Virus Infection 10/29/2018  . Delirium     Past Surgical History:  Procedure Laterality Date  . FINGER AMPUTATION Right     Prior to Admission medications   Medication Sig Start Date End Date Taking? Authorizing Provider  acetaminophen (TYLENOL) 325 MG tablet Take 2 tablets (650 mg total) by mouth every 6 (six) hours as needed for mild pain (or Fever >/= 101). 11/03/18   Alford HighlandWieting, Richard, MD  albuterol (ACCUNEB) 0.63 MG/3ML nebulizer solution Take 1 ampule by nebulization every 6 (six) hours as needed for shortness of breath.     [provider]  ANORO ELLIPTA 62.5-25 MCG/INH AEPB Inhale 1 puff into the lungs every 6 (six) hours. 10/12/18   [provider]  bimatoprost (LUMIGAN) 0.01 % SOLN Place 1 drop into both eyes at bedtime.    [provider]  cyanocobalamin  500 MCG tablet Take 500 mcg by mouth 3 (three) times a week.    [provider]  diazepam (VALIUM) 10 MG tablet Take 1 tablet (10 mg total) by mouth every 6 (six) hours as needed for anxiety. 11/03/18   Alford HighlandWieting, Richard, MD  doxycycline (VIBRAMYCIN) 100 MG capsule Take 1 capsule (100 mg total) by mouth 2 (two) times daily. 01/18/19   Sharman CheekStafford, Phillip, MD  furosemide (LASIX) 20 MG tablet Take 1 tablet (20 mg total) by mouth daily. 11/04/18   Alford HighlandWieting, Richard, MD  levothyroxine (SYNTHROID) 150 MCG tablet Take 1 tablet (150 mcg total) by mouth daily. 11/03/18 11/03/19  Alford HighlandWieting, Richard, MD  liver  oil-zinc oxide (DESITIN) 40 % ointment Apply topically daily. 11/04/18   Alford HighlandWieting, Richard, MD  Melatonin 3 MG TABS Take 1 tablet by mouth daily.    [provider]  metoprolol succinate (TOPROL-XL) 25 MG 24 hr tablet Take 1 tablet (25 mg total) by mouth daily. 11/04/18   Alford HighlandWieting, Richard, MD  OLANZapine zydis (ZYPREXA) 10 MG disintegrating tablet Take 1 tablet (10 mg total) by mouth 2 (two) times daily. 11/03/18   Alford HighlandWieting, Richard, MD  senna-docusate (SENEXON-S) 8.6-50 MG tablet Take 2 tablets by mouth 2 (two) times daily.    [provider]  sertraline (ZOLOFT) 50 MG tablet Take 1 tablet (50 mg total) by mouth daily. 11/03/18   Alford HighlandWieting, Richard, MD  Vitamin D, Ergocalciferol, (DRISDOL) 1.25 MG (50000 UT) CAPS capsule Take 50,000 Units by mouth every 7 (seven) days.    [provider]    Allergies Demerol [meperidine], Epinephrine, Nardil [phenelzine], Morphine and related, Ondansetron, and Shellfish allergy  Family History  Problem Relation Age of Onset  . Alzheimer's disease Mother     Social History Social History   Tobacco Use  . Smoking status: Former Smoker    Quit date: 10/18/2010    Years since quitting: 8.4  . Smokeless tobacco: Never Used  Substance Use Topics  . Alcohol use: No  . Drug use: No    Review of Systems Constitutional: No fever/chills or recent illness.  Does tell me he has chronic swelling in his legs Eyes: No visual changes. Cardiovascular: Denies chest pain. Respiratory: Denies shortness of breath. Gastrointestinal: Tells me he wants something to eat but he wants to just be able go home and eat it. Musculoskeletal: Negative for back pain.  Chronic swelling Skin: Negative for rash. Neurological: Negative for headaches or weakness.    ____________________________________________   PHYSICAL EXAM:  VITAL SIGNS: ED Triage Vitals  Enc Vitals Group     BP --      Pulse Rate 03/12/19 0529 84     Resp 03/12/19 0529 18     Temp  --      Temp src --      SpO2 03/12/19 0529 95 %     Weight 03/12/19 0530 180 lb (81.6 kg)     Height 03/12/19 0530 5\' 6"  (1.676 m)     Head Circumference --      Peak Flow --      Pain Score 03/12/19 0530 0     Pain Loc --      Pain Edu? --      Excl. in [place]? --     Constitutional: Alert and oriented.  He is well oriented to situation.  Does appear slightly anxious though. Eyes: Conjunctivae are normal. Head: Atraumatic. Nose: No congestion/rhinnorhea. Mouth/Throat: Mucous membranes are moist. Neck: No stridor.  Cardiovascular: Patient  refused auscultation of the chest. Respiratory: Normal respiratory effort.  No retractions.  Gastrointestinal:  Musculoskeletal: No lower extremity tenderness does demonstrate to me that he has swollen ankles but he reports this is all the time. Neurologic:  Normal speech and language. No gross focal neurologic deficits are appreciated.  Skin:  Skin is warm, dry and intact. No rash noted. Psychiatric: Mood and affect are slightly anxious, he is also demanding that he needs to be discharged that he does not want to be evaluated in the emergency room. Speech and behavior are normal.  ____________________________________________   LABS (all labs ordered are listed, but only abnormal results are displayed)  Labs Reviewed - No data to display ____________________________________________  EKG   ____________________________________________  RADIOLOGY   ____________________________________________   PROCEDURES  Procedure(s) performed: None  Procedures  Critical Care performed: No  ____________________________________________   INITIAL IMPRESSION / ASSESSMENT AND PLAN / ED COURSE  Pertinent labs & imaging results that were available during my care of the patient were reviewed by me and considered in my medical decision making (see chart for details).   History patient evidently is noncompliant with oxygen use.  EMS was called to have  the patient evaluated, patient arrives here and he is awake fully alert oriented and refusing medical care.    I discussed with the patient, we had a conversation about new to me wishing to evaluate him make sure his oxygen levels are dropping, make sure his x-ray looks okay make sure he does not have any fluid in the lungs as he is fluid in both feet.  However, with shared medical decision making and patient is his own medical decision maker he refuses care.  I do not feel I can force care upon this gentleman, and he adamantly refused the right now.  His oxygen saturations 95% on room air he allowed a heart rate in his respirations are normal.  He strongly advised at least medical evaluation, but patient refused.  I did attempt to call his son Merry Proud with the patient's permission with the phone number does not go through as listed.  Patient reports he wishes to be discharged, I offered and the patient is well aware that he can return at any time.  Eric [name] was evaluated in Emergency Department on 03/12/2019 for the symptoms described in the history of present illness. He was evaluated in the context of the global COVID-19 pandemic, which necessitated consideration that the patient might be at risk for infection with the SARS-CoV-2 virus that causes COVID-19. Institutional protocols and algorithms that pertain to the evaluation of patients at risk for COVID-19 are in a state of rapid change based on information released by regulatory bodies including the CDC and federal and state organizations. These policies and algorithms were followed during the patient's care in the ED.  Patient denies COVID exposure.  No obvious COVID-like symptoms.  ____________________________________________   FINAL CLINICAL IMPRESSION(S) / ED DIAGNOSES  Final diagnoses:  Encounter for medical screening examination        Note:  This document was prepared using Dragon voice recognition software and may include  unintentional dictation errors       Delman Kitten, MD 03/12/19 [phone]

## 2019-03-12 NOTE — Discharge Instructions (Addendum)
You may return to the emergency room at any time.  Return to the ER right away if you develop shortness of breath, chest pain, weakness, or other new concerns arise.

## 2019-04-19 ENCOUNTER — Other Ambulatory Visit: Payer: Self-pay

## 2019-05-07 ENCOUNTER — Ambulatory Visit: Payer: Medicare Other | Admitting: Family

## 2019-05-10 ENCOUNTER — Other Ambulatory Visit: Payer: Self-pay

## 2019-05-10 ENCOUNTER — Emergency Department
Admission: EM | Admit: 2019-05-10 | Discharge: 2019-05-11 | Disposition: A | Discharge status: Skilled Nursing Facility | Payer: Medicare Other | Attending: Emergency Medicine | Admitting: Emergency Medicine

## 2019-05-10 ENCOUNTER — Emergency Department: Payer: Medicare Other

## 2019-05-10 DIAGNOSIS — Z79899 Other long term (current) drug therapy: Secondary | ICD-10-CM | POA: Insufficient documentation

## 2019-05-10 DIAGNOSIS — E039 Hypothyroidism, unspecified: Secondary | ICD-10-CM | POA: Insufficient documentation

## 2019-05-10 DIAGNOSIS — I110 Hypertensive heart disease with heart failure: Secondary | ICD-10-CM | POA: Diagnosis not present

## 2019-05-10 DIAGNOSIS — R079 Chest pain, unspecified: Secondary | ICD-10-CM | POA: Diagnosis present

## 2019-05-10 DIAGNOSIS — N3000 Acute cystitis without hematuria: Secondary | ICD-10-CM | POA: Insufficient documentation

## 2019-05-10 DIAGNOSIS — F0390 Unspecified dementia without behavioral disturbance: Secondary | ICD-10-CM | POA: Diagnosis not present

## 2019-05-10 DIAGNOSIS — Z87891 Personal history of nicotine dependence: Secondary | ICD-10-CM | POA: Insufficient documentation

## 2019-05-10 DIAGNOSIS — I5042 Chronic combined systolic (congestive) and diastolic (congestive) heart failure: Secondary | ICD-10-CM | POA: Insufficient documentation

## 2019-05-10 MED ORDER — DROPERIDOL 2.5 MG/ML IJ SOLN: 2.5000 mg | Freq: Once | INTRAMUSCULAR | Status: DC

## 2019-05-10 MED ORDER — LORAZEPAM 2 MG/ML IJ SOLN
2.0000 mg | Freq: Once | INTRAMUSCULAR | Status: DC
  Filled 2019-05-10: qty 1

## 2019-05-10 MED ORDER — HALOPERIDOL LACTATE 5 MG/ML IJ SOLN
5.0000 mg | Freq: Once | INTRAMUSCULAR | Status: DC
  Filled 2019-05-10: qty 1
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
## 2019-05-10 NOTE — ED Notes (Addendum)
Spoke with med-tech from the [facility]. [phone]. Tech states pt was "fighting with all the staff members" and felt pt possibly needs a higher level of care. MD aware. Pt is currently his own power of attorney but phone number provided by staff for his cousin Rachel Bo [phone] to discuss concerns.
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
## 2019-05-10 NOTE — ED Provider Notes (Signed)
[facility] Emergency Department Provider Note  ____________________________________________   First MD Initiated Contact with Patient 05/10/19 2207     (approximate)  I have reviewed the triage vital signs and the nursing notes.   HISTORY  Chief Complaint Pleurisy    HPI Eric [name] is a 73 y.o. male with CHF, A. fib, hypertension, on baseline oxygen who presents with chest pain.  Patient got an argument with the staff at [facility] and ended up leaving and wheeling himself outside.  He was wheeling himself without any oxygen on.  When please got there he was being agitated.  When EMS got there he was having left-sided chest pain that was moderate, constant, nothing made it better, nothing makes it worse.  Still has little bit of discomfort right now.   Per [facility] [place] patient was having chest pain earlier in the day but when EMS came he was refusing transportation at that time.          Past Medical History:  Diagnosis Date  . Acute respiratory failure with hypoxia (HCC)    multiple prior admissions to [facility] and [facility] for CHF exacerbation / respiratory failure  . Anxiety and depression   . Atrial fibrillation (HCC) 07/25/2018   new-onset during [facility] admission in Jan 2020  . Cataract   . CHF (congestive heart failure) (HCC)    EF <= 50% as of Jan 2020  . Current use of long term anticoagulation 07/2018   Started on apixaban for new-onset a-fib in Jan 2020  . Dementia (HCC)    possible, likely age-related, documented decreased cognitive function on prior hospitalizations  . Gallstones   . Glaucoma   . Hypertension   . Hypoglycemia   . Hypothyroidism   . Inguinal hernia   . Sleep apnea   . Thyroid disease     Patient Active Problem List   Diagnosis Date Noted  . Chronic combined systolic and diastolic CHF (congestive heart failure) (HCC) 11/16/2018  . Anasarca 11/16/2018  . Hypothyroidism 11/16/2018  . PAF (paroxysmal  atrial fibrillation) (HCC) 11/16/2018  . HTN (hypertension) 11/16/2018  . Generalized anxiety disorder 11/03/2018  . Major depression in partial remission (HCC) 11/03/2018  . Acute hypoxemic respiratory failure (HCC) 10/29/2018  . Suspected COVID-19 virus infection 10/29/2018  . Delirium     Past Surgical History:  Procedure Laterality Date  . FINGER AMPUTATION Right     Prior to Admission medications   Medication Sig Start Date End Date Taking? Authorizing Provider  acetaminophen (TYLENOL) 325 MG tablet Take 2 tablets (650 mg total) by mouth every 6 (six) hours as needed for mild pain (or Fever >/= 101). 11/03/18   Alford Highland, MD  albuterol (ACCUNEB) 0.63 MG/3ML nebulizer solution Take 1 ampule by nebulization every 6 (six) hours as needed for shortness of breath.     [provider]  ANORO ELLIPTA 62.5-25 MCG/INH AEPB Inhale 1 puff into the lungs every 6 (six) hours. 10/12/18   [provider]  bimatoprost (LUMIGAN) 0.01 % SOLN Place 1 drop into both eyes at bedtime.    [provider]  cyanocobalamin 500 MCG tablet Take 500 mcg by mouth 3 (three) times a week.    [provider]  diazepam (VALIUM) 10 MG tablet Take 1 tablet (10 mg total) by mouth every 6 (six) hours as needed for anxiety. 11/03/18   Alford Highland, MD  doxycycline (VIBRAMYCIN) 100 MG capsule Take 1 capsule (100 mg total) by  mouth 2 (two) times daily. 01/18/19   Sharman CheekStafford, Phillip, MD  furosemide (LASIX) 20 MG tablet Take 1 tablet (20 mg total) by mouth daily. 11/04/18   Alford HighlandWieting, Richard, MD  levothyroxine (SYNTHROID) 150 MCG tablet Take 1 tablet (150 mcg total) by mouth daily. 11/03/18 11/03/19  Alford HighlandWieting, Richard, MD  liver oil-zinc oxide (DESITIN) 40 % ointment Apply topically daily. 11/04/18   Alford HighlandWieting, Richard, MD  Melatonin 3 MG TABS Take 1 tablet by mouth daily.    [provider]  metoprolol succinate (TOPROL-XL) 25 MG 24 hr tablet Take 1 tablet (25 mg total) by mouth  daily. 11/04/18   Alford HighlandWieting, Richard, MD  OLANZapine zydis (ZYPREXA) 10 MG disintegrating tablet Take 1 tablet (10 mg total) by mouth 2 (two) times daily. 11/03/18   Alford HighlandWieting, Richard, MD  senna-docusate (SENEXON-S) 8.6-50 MG tablet Take 2 tablets by mouth 2 (two) times daily.    [provider]  sertraline (ZOLOFT) 50 MG tablet Take 1 tablet (50 mg total) by mouth daily. 11/03/18   Alford HighlandWieting, Richard, MD  Vitamin D, Ergocalciferol, (DRISDOL) 1.25 MG (50000 UT) CAPS capsule Take 50,000 Units by mouth every 7 (seven) days.    [provider]    Allergies Demerol [meperidine], Epinephrine, Nardil [phenelzine], Morphine and related, Ondansetron, and Shellfish allergy  Family History  Problem Relation Age of Onset  . Alzheimer's disease Mother     Social History Social History   Tobacco Use  . Smoking status: Former Smoker    Quit date: 10/18/2010    Years since quitting: 8.5  . Smokeless tobacco: Never Used  Substance Use Topics  . Alcohol use: No  . Drug use: No      Review of Systems Constitutional: No fever/chills Eyes: No visual changes. ENT: No sore throat. Cardiovascular: Positive chest pain Respiratory: Denies shortness of breath. Gastrointestinal: No abdominal pain.  No nausea, no vomiting.  No diarrhea.  No constipation. Genitourinary: Negative for dysuria. Musculoskeletal: Negative for back pain. Skin: Negative for rash. Neurological: Negative for headaches, focal weakness or numbness. All other ROS negative ____________________________________________   PHYSICAL EXAM:  VITAL SIGNS: Blood pressure (!) 157/79, pulse 100, temperature 97.9 F (36.6 C), temperature source Oral, resp. rate 20, height 5\' 7"  (1.702 m), weight 97.5 kg, SpO2 95 %.   Constitutional: Alert and oriented. Well appearing and in no acute distress. Eyes: Conjunctivae are normal. EOMI. Head: Atraumatic. Nose: No congestion/rhinnorhea. Mouth/Throat: Mucous membranes are moist.    Neck: No stridor. Trachea Midline. FROM Cardiovascular: Normal rate, regular rhythm. Grossly normal heart sounds.  Good peripheral circulation. Respiratory: Normal respiratory effort.  No retractions. Lungs CTAB. Gastrointestinal: Soft and nontender. No distention. No abdominal bruits.  Musculoskeletal: No lower extremity tenderness nor edema.  No joint effusions. Neurologic:  Normal speech and language. No gross focal neurologic deficits are appreciated.  Skin:  Skin is warm, dry and intact. No rash noted. Psychiatric: Mood and affect are normal. Speech and behavior are normal. GU: Deferred   ____________________________________________   LABS (all labs ordered are listed, but only abnormal results are displayed)  Labs Reviewed  CBC WITH DIFFERENTIAL/PLATELET  BASIC METABOLIC PANEL  TROPONIN I (HIGH SENSITIVITY)   ____________________________________________   ED ECG REPORT I, Concha SeMary E Kaymen Adrian, the attending physician, personally viewed and interpreted this ECG.   ____________________________________________  RADIOLOGY I, Concha SeMary E Zakaree Mcclenahan, personally viewed and evaluated these images (plain radiographs) as part of my medical decision making, as well as reviewing the written report by the radiologist.  ED MD  interpretation  Official radiology report(s): No results found.  ____________________________________________   PROCEDURES  Procedure(s) performed (including Critical Care):  Procedures   ____________________________________________   INITIAL IMPRESSION / ASSESSMENT AND PLAN / ED COURSE   Eric [name] was evaluated in Emergency Department on 05/10/2019 for the symptoms described in the history of present illness. He was evaluated in the context of the global COVID-19 pandemic, which necessitated consideration that the patient might be at risk for infection with the SARS-CoV-2 virus that causes COVID-19. Institutional protocols and algorithms that pertain to the  evaluation of patients at risk for COVID-19 are in a state of rapid change based on information released by regulatory bodies including the CDC and federal and state organizations. These policies and algorithms were followed during the patient's care in the ED.    Most Likely DDx:  -ACS given risk factors/age   DDx that was also considered d/t potential to cause harm, but was found less likely based on history and physical (as detailed above): -PNA (no fevers, cough but CXR to evaluate) -PNX (reassured with equal b/l breath sounds, CXR to evaluate) -Symptomatic anemia (will get H&H) -Pulmonary embolism as no sob at rest, not pleuritic in nature, no hypoxia -Aortic Dissection as no tearing pain and no radiation to the mid back, pulses equal -Pericarditis no rub on exam, EKG changes or hx to suggest dx -Tamponade (no notable SOB, tachycardic, hypotensive) -Esophageal rupture (no h/o diffuse vomitting/no crepitus)    10:19 PM attempted to call son and Uncle and numbers are disconnected.    Cousin-Dustin [phone].  Patient is requesting to leave and does not want any further care but patient is unable to repeat back to me to show that he is got the capacity to understand what is going on.  I explained patient that the reason we need blood work to look at his heart I asked him to repeat that back and he just says I do not need that done.  He is unable to explain back to me that the risks are death or permanent disability.  He has been increasingly agitated with pounding on the glass frame near the nurses station and then attempted to get out of bed.  Patient does not seem to have capacity to make decisions.  I discussed with the cousin Rachel Bo who also agrees and we plan to sedate patient in order to get labs to make sure that he has no evidence of heart issues.  Patient handed off to oncoming team pending labs, EKG, chest x-ray and rediscussion with [facility] to see if he is able to go  back there.      ____________________________________________   FINAL CLINICAL IMPRESSION(S) / ED DIAGNOSES   Final diagnoses:  Chest pain, unspecified type     MEDICATIONS GIVEN DURING THIS VISIT:  Medications  haloperidol lactate (HALDOL) injection 5 mg (has no administration in time range)  LORazepam (ATIVAN) injection 2 mg (has no administration in time range)     ED Discharge Orders    None       Note:  This document was prepared using Dragon voice recognition software and may include unintentional dictation errors.   Vanessa [place], MD 05/10/19 [phone]

## 2019-05-11 ENCOUNTER — Emergency Department: Payer: Medicare Other

## 2019-05-11 DIAGNOSIS — R079 Chest pain, unspecified: Secondary | ICD-10-CM | POA: Diagnosis not present

## 2019-05-11 LAB — BASIC METABOLIC PANEL
Anion gap: 7 (ref 5–15)
BUN: 15 mg/dL (ref 8–23)
CO2: 29 mmol/L (ref 22–32)
Calcium: 8.5 mg/dL — ABNORMAL LOW (ref 8.9–10.3)
Chloride: 102 mmol/L (ref 98–111)
Creatinine, Ser: 0.75 mg/dL (ref 0.61–1.24)
GFR calc Af Amer: >60 mL/min (ref >60)
GFR calc non Af Amer: >60 mL/min (ref >60)
Glucose, Bld: 97 mg/dL (ref 70–99)
Potassium: 4.2 mmol/L (ref 3.5–5.1)
Sodium: 138 mmol/L (ref 135–145)

## 2019-05-11 LAB — CBC WITH DIFFERENTIAL/PLATELET
Abs Immature Granulocytes: 0.1 10*3/uL — ABNORMAL HIGH (ref 0.00–0.07)
Basophils Absolute: 0 10*3/uL (ref 0.0–0.1)
Basophils Relative: 0 %
Eosinophils Absolute: 0.1 10*3/uL (ref 0.0–0.5)
Eosinophils Relative: 1 %
HCT: 43 % (ref 39.0–52.0)
Hemoglobin: 13.7 g/dL (ref 13.0–17.0)
Immature Granulocytes: 1 %
Lymphocytes Relative: 4 %
Lymphs Abs: 0.5 10*3/uL — ABNORMAL LOW (ref 0.7–4.0)
MCH: 28.5 pg (ref 26.0–34.0)
MCHC: 31.9 g/dL (ref 30.0–36.0)
MCV: 89.4 fL (ref 80.0–100.0)
Monocytes Absolute: 0.9 10*3/uL (ref 0.1–1.0)
Monocytes Relative: 7 %
Neutro Abs: 10.7 10*3/uL — ABNORMAL HIGH (ref 1.7–7.7)
Neutrophils Relative %: 87 %
Platelets: 191 10*3/uL (ref 150–400)
RBC: 4.81 MIL/uL (ref 4.22–5.81)
RDW: 13.9 % (ref 11.5–15.5)
WBC: 12.3 10*3/uL — ABNORMAL HIGH (ref 4.0–10.5)
nRBC: 0 % (ref 0.0–0.2)

## 2019-05-11 LAB — URINALYSIS, ROUTINE W REFLEX MICROSCOPIC
Bilirubin Urine: NEGATIVE
Glucose, UA: NEGATIVE mg/dL
Hgb urine dipstick: NEGATIVE
Ketones, ur: NEGATIVE mg/dL
Nitrite: POSITIVE — AB
Protein, ur: NEGATIVE mg/dL
Specific Gravity, Urine: 1.019 (ref 1.005–1.030)
pH: 6 (ref 5.0–8.0)

## 2019-05-11 LAB — TROPONIN I (HIGH SENSITIVITY): Troponin I (High Sensitivity): 4 ng/L (ref <18)

## 2019-05-11 MED ORDER — CEPHALEXIN 500 MG PO CAPS: 500.0000 mg | ORAL_CAPSULE | Freq: Two times a day (BID) | ORAL | 0 refills | Status: AC

## 2019-05-11 MED ORDER — CEPHALEXIN 500 MG PO CAPS
500.0000 mg | ORAL_CAPSULE | Freq: Once | ORAL | Status: AC
  Administered 2019-05-11: 02:00:00 500 mg via ORAL
  Filled 2019-05-11: qty 1

## 2019-05-11 NOTE — ED Notes (Signed)
Pt cooperating with this nurse at this time.  Pt has been given two ice creams, sprite and coke, sandwich tray but refusing to eat at this time (states will eat later).  Pt did let this RN finally draw blood with a butterfly stick.  Pt states will try to get urine sample for this RN and has agreed to a chest xray in the room.

## 2019-05-11 NOTE — ED Notes (Signed)
Patient cooperative at this time

## 2019-05-11 NOTE — ED Notes (Signed)
Report provided to [name] at the [facility]. Diagnosis and treatment plan went over with [name] with verbal understanding provided.

## 2019-05-11 NOTE — ED Provider Notes (Signed)
Patient allow the nursing staff to obtain blood, urine and perform a chest x-ray.  Laboratory data notable for possible urinary tract infection.  Patient given Keflex in the emergency department which he agreed to take.  Troponin negative.  Patient's IVC rescinded as he was compliant with treatment.  Patient will be discharged to the [facility] with prescription for Keflex pending urine cultures.   Gregor Hams, MD 05/11/19 [facility] [place]

## 2019-05-12 ENCOUNTER — Emergency Department: Payer: Medicare Other

## 2019-05-12 ENCOUNTER — Emergency Department
Admission: EM | Admit: 2019-05-12 | Discharge: 2019-05-12 | Disposition: A | Discharge status: Home / Self Care | Payer: Medicare Other | Attending: Emergency Medicine | Admitting: Emergency Medicine

## 2019-05-12 ENCOUNTER — Other Ambulatory Visit: Payer: Self-pay

## 2019-05-12 DIAGNOSIS — Z87891 Personal history of nicotine dependence: Secondary | ICD-10-CM | POA: Insufficient documentation

## 2019-05-12 DIAGNOSIS — Z79899 Other long term (current) drug therapy: Secondary | ICD-10-CM | POA: Diagnosis not present

## 2019-05-12 DIAGNOSIS — I5042 Chronic combined systolic (congestive) and diastolic (congestive) heart failure: Secondary | ICD-10-CM | POA: Insufficient documentation

## 2019-05-12 DIAGNOSIS — R079 Chest pain, unspecified: Secondary | ICD-10-CM | POA: Diagnosis present

## 2019-05-12 DIAGNOSIS — R0789 Other chest pain: Secondary | ICD-10-CM | POA: Insufficient documentation

## 2019-05-12 DIAGNOSIS — E039 Hypothyroidism, unspecified: Secondary | ICD-10-CM | POA: Diagnosis not present

## 2019-05-12 DIAGNOSIS — I110 Hypertensive heart disease with heart failure: Secondary | ICD-10-CM | POA: Insufficient documentation

## 2019-05-12 LAB — BASIC METABOLIC PANEL
Anion gap: 10 (ref 5–15)
BUN: 15 mg/dL (ref 8–23)
CO2: 28 mmol/L (ref 22–32)
Calcium: 8.7 mg/dL — ABNORMAL LOW (ref 8.9–10.3)
Chloride: 100 mmol/L (ref 98–111)
Creatinine, Ser: 0.8 mg/dL (ref 0.61–1.24)
GFR calc Af Amer: >60 mL/min (ref >60)
GFR calc non Af Amer: >60 mL/min (ref >60)
Glucose, Bld: 117 mg/dL — ABNORMAL HIGH (ref 70–99)
Potassium: 4.3 mmol/L (ref 3.5–5.1)
Sodium: 138 mmol/L (ref 135–145)

## 2019-05-12 LAB — URINALYSIS, COMPLETE (UACMP) WITH MICROSCOPIC
Bilirubin Urine: NEGATIVE
Glucose, UA: NEGATIVE mg/dL
Ketones, ur: NEGATIVE mg/dL
Nitrite: POSITIVE — AB
Protein, ur: NEGATIVE mg/dL
Specific Gravity, Urine: 1.016 (ref 1.005–1.030)
Squamous Epithelial / HPF: NONE SEEN (ref 0–5)
pH: 6 (ref 5.0–8.0)

## 2019-05-12 LAB — CBC
HCT: 43.3 % (ref 39.0–52.0)
Hemoglobin: 14 g/dL (ref 13.0–17.0)
MCH: 29 pg (ref 26.0–34.0)
MCHC: 32.3 g/dL (ref 30.0–36.0)
MCV: 89.6 fL (ref 80.0–100.0)
Platelets: 192 10*3/uL (ref 150–400)
RBC: 4.83 MIL/uL (ref 4.22–5.81)
RDW: 14.1 % (ref 11.5–15.5)
WBC: 10.2 10*3/uL (ref 4.0–10.5)
nRBC: 0 % (ref 0.0–0.2)

## 2019-05-12 LAB — TROPONIN I (HIGH SENSITIVITY): Troponin I (High Sensitivity): 4 ng/L (ref <18)

## 2019-05-12 LAB — FIBRIN DERIVATIVES D-DIMER (ARMC ONLY): Fibrin derivatives D-dimer (ARMC): 644.25 ng/mL (FEU) — ABNORMAL HIGH (ref 0.00–499.00)

## 2019-05-12 MED ORDER — CEFTRIAXONE SODIUM 1 G IJ SOLR
1.0000 g | Freq: Once | INTRAMUSCULAR | Status: DC
  Filled 2019-05-12: qty 10

## 2019-05-12 MED ORDER — LIDOCAINE HCL (PF) 1 % IJ SOLN
INTRAMUSCULAR | Status: AC
  Filled 2019-05-12: qty 5

## 2019-05-12 NOTE — ED Triage Notes (Signed)
Pt comes from the [facility] for chest pain. Pt supposed to be on oxygen chronically but has been refusing to wear it. Pt also has UTI and supposed to be taking his medications but has not been. Pt rolled his wheelchair in front of traffic and was evaluated a few days ago. CP started a couple of days ago. 12 lead for EMS looked normal. 96% on 4L. 80% on RA.

## 2019-05-12 NOTE — ED Provider Notes (Signed)
[facility] Emergency Department Provider Note  ____________________________________________   First MD Initiated Contact with Patient 05/12/19 1545     (approximate)  I have reviewed the triage vital signs and the nursing notes.   HISTORY  Chief Complaint Chest Pain    HPI Eric [name] is a 73 y.o. male  With extensive PMHx as below here with transient chest pain. Pt is very difficult historian. He reports that he was sent here from his living facility because he had "a little pain in his chest." He reports that he has this pain "all the time" and that he is not concerned about it. He says he just wanted to get out of his facility "for a little" but now wants to leave immediately. He says the pain was transient, sharp, and resolved spontaneously. He had no SOB, diaphoresis, nausea, vomiting with it. He has no other complaints. Of note, recently seen for agitation and psych evaluation, diagnosed with UTI. He denies any abd pain, urinary sx today.        Past Medical History:  Diagnosis Date  . Acute respiratory failure with hypoxia (HCC)    multiple prior admissions to [facility] and [facility] for CHF exacerbation / respiratory failure  . Anxiety and depression   . Atrial fibrillation (HCC) 07/25/2018   new-onset during [facility] admission in Jan 2020  . Cataract   . CHF (congestive heart failure) (HCC)    EF <= 50% as of Jan 2020  . Current use of long term anticoagulation 07/2018   Started on apixaban for new-onset a-fib in Jan 2020  . Dementia (HCC)    possible, likely age-related, documented decreased cognitive function on prior hospitalizations  . Gallstones   . Glaucoma   . Hypertension   . Hypoglycemia   . Hypothyroidism   . Inguinal hernia   . Sleep apnea   . Thyroid disease     Patient Active Problem List   Diagnosis Date Noted  . Chronic combined systolic and diastolic CHF (congestive heart failure) (HCC) 11/16/2018  . Anasarca 11/16/2018  .  Hypothyroidism 11/16/2018  . PAF (paroxysmal atrial fibrillation) (HCC) 11/16/2018  . HTN (hypertension) 11/16/2018  . Generalized anxiety disorder 11/03/2018  . Major depression in partial remission (HCC) 11/03/2018  . Acute hypoxemic respiratory failure (HCC) 10/29/2018  . Suspected COVID-19 virus infection 10/29/2018  . Delirium     Past Surgical History:  Procedure Laterality Date  . FINGER AMPUTATION Right     Prior to Admission medications   Medication Sig Start Date End Date Taking? Authorizing Provider  ANORO ELLIPTA 62.5-25 MCG/INH AEPB Inhale 1 puff into the lungs every 6 (six) hours. 10/12/18  Yes [provider]  bimatoprost (LUMIGAN) 0.01 % SOLN Place 1 drop into both eyes at bedtime.   Yes [provider]  cephALEXin (KEFLEX) 500 MG capsule Take 1 capsule (500 mg total) by mouth 2 (two) times daily for 10 days. 05/11/19 05/21/19 Yes Darci Current, MD  cyanocobalamin 500 MCG tablet Take 500 mcg by mouth 3 (three) times a week.   Yes [provider]  diazepam (VALIUM) 10 MG tablet Take 1 tablet (10 mg total) by mouth every 6 (six) hours as needed for anxiety. 11/03/18  Yes Wieting, Richard, MD  furosemide (LASIX) 20 MG tablet Take 1 tablet (20 mg total) by mouth daily. 11/04/18  Yes Wieting, Richard, MD  levothyroxine (SYNTHROID) 150 MCG tablet Take 1 tablet (150 mcg total) by mouth daily. 11/03/18 11/03/19 Yes  Loletha Grayer, MD  metoprolol succinate (TOPROL-XL) 25 MG 24 hr tablet Take 1 tablet (25 mg total) by mouth daily. 11/04/18  Yes Wieting, Richard, MD  OLANZapine zydis (ZYPREXA) 10 MG disintegrating tablet Take 1 tablet (10 mg total) by mouth 2 (two) times daily. 11/03/18  Yes Wieting, Richard, MD  sertraline (ZOLOFT) 50 MG tablet Take 1 tablet (50 mg total) by mouth daily. 11/03/18  Yes Loletha Grayer, MD  acetaminophen (TYLENOL) 325 MG tablet Take 2 tablets (650 mg total) by mouth every 6 (six) hours as needed for mild pain (or Fever >/=  101). 11/03/18   Loletha Grayer, MD  albuterol (ACCUNEB) 0.63 MG/3ML nebulizer solution Take 1 ampule by nebulization every 6 (six) hours as needed for shortness of breath.     [provider]  doxycycline (VIBRAMYCIN) 100 MG capsule Take 1 capsule (100 mg total) by mouth 2 (two) times daily. Patient not taking: Reported on 05/12/2019 01/18/19   Carrie Mew, MD  liver oil-zinc oxide (DESITIN) 40 % ointment Apply topically daily. 11/04/18   Loletha Grayer, MD  Melatonin 3 MG TABS Take 1 tablet by mouth daily.    [provider]  senna-docusate (SENEXON-S) 8.6-50 MG tablet Take 2 tablets by mouth 2 (two) times daily.    [provider]  Vitamin D, Ergocalciferol, (DRISDOL) 1.25 MG (50000 UT) CAPS capsule Take 50,000 Units by mouth every 7 (seven) days.    [provider]    Allergies Demerol [meperidine], Epinephrine, Nardil [phenelzine], Morphine and related, Ondansetron, and Shellfish allergy  Family History  Problem Relation Age of Onset  . Alzheimer's disease Mother     Social History Social History   Tobacco Use  . Smoking status: Former Smoker    Quit date: 10/18/2010    Years since quitting: 8.5  . Smokeless tobacco: Never Used  Substance Use Topics  . Alcohol use: No  . Drug use: No    Review of Systems  Review of Systems  Constitutional: Negative for chills, fatigue and fever.  HENT: Negative for sore throat.   Respiratory: Negative for shortness of breath.   Cardiovascular: Positive for chest pain.  Gastrointestinal: Negative for abdominal pain.  Genitourinary: Negative for flank pain.  Musculoskeletal: Negative for neck pain.  Skin: Negative for rash and wound.  Allergic/Immunologic: Negative for immunocompromised state.  Neurological: Negative for weakness and numbness.  Hematological: Does not bruise/bleed easily.     ____________________________________________  PHYSICAL EXAM:      VITAL SIGNS: ED Triage Vitals   Enc Vitals Group     BP 05/12/19 1536 (!) 146/81     Pulse Rate 05/12/19 1536 80     Resp 05/12/19 1536 20     Temp 05/12/19 1536 97.7 F (36.5 C)     Temp Source 05/12/19 1536 Oral     SpO2 05/12/19 1536 98 %     Weight 05/12/19 1533 250 lb (113.4 kg)     Height 05/12/19 1533 5\' 7"  (1.702 m)     Head Circumference --      Peak Flow --      Pain Score 05/12/19 1532 5     Pain Loc --      Pain Edu? --      Excl. in [place]? --      Physical Exam Vitals signs and nursing note reviewed.  Constitutional:      General: He is not in acute distress.    Appearance: He is well-developed.  HENT:  Head: Normocephalic and atraumatic.  Eyes:     Conjunctiva/sclera: Conjunctivae normal.  Neck:     Musculoskeletal: Neck supple.  Cardiovascular:     Rate and Rhythm: Normal rate and regular rhythm.     Heart sounds: Normal heart sounds. No murmur. No friction rub.  Pulmonary:     Effort: Pulmonary effort is normal. No respiratory distress.     Breath sounds: Normal breath sounds. No wheezing or rales.     Comments: Mild TTP noted over anterior left chest wall, no bruising, no deformity Abdominal:     General: There is no distension.     Palpations: Abdomen is soft.     Tenderness: There is no abdominal tenderness.  Musculoskeletal:     Right lower leg: Edema (chronic) present.     Left lower leg: Edema (chronic) present.  Skin:    General: Skin is warm.     Capillary Refill: Capillary refill takes less than 2 seconds.  Neurological:     Mental Status: He is alert and oriented to person, place, and time.     Motor: No abnormal muscle tone.       ____________________________________________   LABS (all labs ordered are listed, but only abnormal results are displayed)  Labs Reviewed  BASIC METABOLIC PANEL - Abnormal; Notable for the following components:      Result Value   Glucose, Bld 117 (*)    Calcium 8.7 (*)    All other components within normal limits  FIBRIN  DERIVATIVES D-DIMER ([facility] ONLY) - Abnormal; Notable for the following components:   Fibrin derivatives D-dimer [facility]) 644.25 (*)    All other components within normal limits  URINALYSIS, COMPLETE (UACMP) WITH MICROSCOPIC - Abnormal; Notable for the following components:   Color, Urine YELLOW (*)    APPearance HAZY (*)    Hgb urine dipstick SMALL (*)    Nitrite POSITIVE (*)    Leukocytes,Ua SMALL (*)    Bacteria, UA MANY (*)    All other components within normal limits  CBC  TROPONIN I (HIGH SENSITIVITY)  TROPONIN I (HIGH SENSITIVITY)    ____________________________________________  EKG: Normal sinus rhythm, VR 72. Normal axis and intervals. Compared to 10/1, no St-t segment elevation, depressions, or signs of acute ischemia or infarct. ________________________________________  RADIOLOGY All imaging, including plain films, CT scans, and ultrasounds, independently reviewed by me, and interpretations confirmed via formal radiology reads.  ED MD interpretation:   CXR: No acute changes, no PNA  Official radiology report(s): Dg Chest 2 View  Result Date: 05/12/2019 CLINICAL DATA:  Chest pain EXAM: CHEST - 2 VIEW COMPARISON:  Radiograph 05/11/2019 FINDINGS: Stable appearance of the patient's cardiomegaly and aortic tortuosity. Chronic interstitial changes are noted with a basilar predominance. Mild basilar atelectasis. No new consolidation. No convincing features of edema. No pneumothorax or visible effusion. Degenerative changes are present in the imaged spine and shoulders. Sclerosis and bony remodeling of the right humeral head may reflect sequela prior osteonecrosis. IMPRESSION: 1. Stable cardiomegaly and aortic tortuosity. 2. Chronic interstitial changes. 3. No acute cardiopulmonary abnormality. 4. 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 Electronically Signed   By: Kreg Shropshire M.D.   On: 05/12/2019 16:14    ____________________________________________  PROCEDURES    Procedure(s) performed (including Critical Care):  Procedures  ____________________________________________  INITIAL IMPRESSION / MDM / ASSESSMENT AND PLAN / ED COURSE  As part of my medical decision making, I reviewed the following data within the electronic MEDICAL RECORD NUMBER Notes from prior ED visits  and [place] Controlled Substance Database      *Eric [name] was evaluated in Emergency Department on 05/13/2019 for the symptoms described in the history of present illness. He was evaluated in the context of the global COVID-19 pandemic, which necessitated consideration that the patient might be at risk for infection with the SARS-CoV-2 virus that causes COVID-19. Institutional protocols and algorithms that pertain to the evaluation of patients at risk for COVID-19 are in a state of rapid change based on information released by regulatory bodies including the CDC and federal and state organizations. These policies and algorithms were followed during the patient's care in the ED.  Some ED evaluations and interventions may be delayed as a result of limited staffing during the pandemic.*      Medical Decision Making:  73 yo M here with transient, now resolved chest pain. History, work-up somewhat difficult as pt initially refusing work-up. He was just seen and discharged for somewhat similar issues, during which time he was actually IVC'ed and work-up completed.  Regarding his CP - his EKG is non-ischemic and trop neg, which is reassuring. He declines to stay for repeat troponin despite my discussion that we cannot r/o ACS without this. Low suspicion clinically, however. CXR is clear. Lab work is overall reassuring. Pain is reproducible on exam and may be MSK in etiology.  Regarding his mental status, pt is alert, oriented here. He has mental capacity per records and review and clinical exam today and he refuses further work-up. Of note, he has a h/o refusing to wear his oxygen and I reiterated that  he needs to wear it all times today, esp in setting of chest pain.   Regarding recent UTI - I reiterated importance of taking medications, and offered IM Rocephin which he refused. He does not appear toxic, febrile, or septic. No CVAT.  ____________________________________________  FINAL CLINICAL IMPRESSION(S) / ED DIAGNOSES  Final diagnoses:  Atypical chest pain     MEDICATIONS GIVEN DURING THIS VISIT:  Medications - No data to display   ED Discharge Orders    None       Note:  This document was prepared using Dragon voice recognition software and may include unintentional dictation errors.   Shaune PollackIsaacs, Nuri Larmer, MD 05/13/19 [phone]

## 2019-05-12 NOTE — ED Notes (Signed)
Pt refused final VS or to sign for DC.

## 2019-05-12 NOTE — ED Notes (Signed)
Multiple attempts were made by RNs and Medic to encourage pt to take Rocephin without success.

## 2019-05-12 NOTE — ED Notes (Signed)
Pt refusing all care including VS reevaluation.

## 2019-05-14 LAB — URINE CULTURE: Culture: >=100000 — AB

## 2019-05-15 NOTE — Progress Notes (Signed)
Brief Pharmacy Note  73 y/o M who presented to [facility] ED from the [place] on 10/22 and 10/24 c/o transient chest pain. Urine culture obtained 10/23 resulted ESBL E coli strain. Patient had been discharged on cephalexin for UTI. Spoke with EDP about culture results and resistance to d/c antibiotic. No drug allergies to antibiotics listed in chart. Based on Scr obtained on 10/24 visit, calculated CrCl 80-90 mL/min. Obtained verbal prescription for nitrofurantoin (Macrobid) 100 mg BID x 7 days from EDP. Telephoned the [facility] and spoke with staff caring for patient. Advised to stop cephalexin and that a new antibiotic prescription would be called into pharmacy. Per staff prescriptions for residents are filled at [facility] in [place]. Called in new Rx to pharmacy.   [place] Resident 15 May 2019

## 2019-05-26 ENCOUNTER — Emergency Department
Admission: EM | Admit: 2019-05-26 | Discharge: 2019-05-27 | Disposition: A | Discharge status: Nursing Home (Includes ICF) | Payer: Medicare Other | Attending: Emergency Medicine | Admitting: Emergency Medicine

## 2019-05-26 ENCOUNTER — Emergency Department: Payer: Medicare Other

## 2019-05-26 ENCOUNTER — Encounter: Payer: Self-pay | Admitting: Emergency Medicine

## 2019-05-26 ENCOUNTER — Other Ambulatory Visit: Payer: Self-pay

## 2019-05-26 DIAGNOSIS — I110 Hypertensive heart disease with heart failure: Secondary | ICD-10-CM | POA: Diagnosis not present

## 2019-05-26 DIAGNOSIS — Z87891 Personal history of nicotine dependence: Secondary | ICD-10-CM | POA: Diagnosis not present

## 2019-05-26 DIAGNOSIS — R079 Chest pain, unspecified: Secondary | ICD-10-CM | POA: Insufficient documentation

## 2019-05-26 DIAGNOSIS — R0602 Shortness of breath: Secondary | ICD-10-CM | POA: Diagnosis present

## 2019-05-26 DIAGNOSIS — E039 Hypothyroidism, unspecified: Secondary | ICD-10-CM | POA: Insufficient documentation

## 2019-05-26 DIAGNOSIS — Z79899 Other long term (current) drug therapy: Secondary | ICD-10-CM | POA: Insufficient documentation

## 2019-05-26 DIAGNOSIS — F0390 Unspecified dementia without behavioral disturbance: Secondary | ICD-10-CM | POA: Insufficient documentation

## 2019-05-26 DIAGNOSIS — I5042 Chronic combined systolic (congestive) and diastolic (congestive) heart failure: Secondary | ICD-10-CM | POA: Insufficient documentation

## 2019-05-26 LAB — CBC WITH DIFFERENTIAL/PLATELET
Abs Immature Granulocytes: 0.09 10*3/uL — ABNORMAL HIGH (ref 0.00–0.07)
Basophils Absolute: 0 10*3/uL (ref 0.0–0.1)
Basophils Relative: 0 %
Eosinophils Absolute: 0.2 10*3/uL (ref 0.0–0.5)
Eosinophils Relative: 1 %
HCT: 42.8 % (ref 39.0–52.0)
Hemoglobin: 13.9 g/dL (ref 13.0–17.0)
Immature Granulocytes: 1 %
Lymphocytes Relative: 3 %
Lymphs Abs: 0.4 10*3/uL — ABNORMAL LOW (ref 0.7–4.0)
MCH: 29.3 pg (ref 26.0–34.0)
MCHC: 32.5 g/dL (ref 30.0–36.0)
MCV: 90.3 fL (ref 80.0–100.0)
Monocytes Absolute: 0.8 10*3/uL (ref 0.1–1.0)
Monocytes Relative: 6 %
Neutro Abs: 12.2 10*3/uL — ABNORMAL HIGH (ref 1.7–7.7)
Neutrophils Relative %: 89 %
Platelets: 177 10*3/uL (ref 150–400)
RBC: 4.74 MIL/uL (ref 4.22–5.81)
RDW: 13.8 % (ref 11.5–15.5)
WBC: 13.7 10*3/uL — ABNORMAL HIGH (ref 4.0–10.5)
nRBC: 0 % (ref 0.0–0.2)

## 2019-05-26 LAB — BASIC METABOLIC PANEL
Anion gap: 10 (ref 5–15)
BUN: 14 mg/dL (ref 8–23)
CO2: 28 mmol/L (ref 22–32)
Calcium: 8.6 mg/dL — ABNORMAL LOW (ref 8.9–10.3)
Chloride: 99 mmol/L (ref 98–111)
Creatinine, Ser: 0.84 mg/dL (ref 0.61–1.24)
GFR calc Af Amer: >60 mL/min (ref >60)
GFR calc non Af Amer: >60 mL/min (ref >60)
Glucose, Bld: 117 mg/dL — ABNORMAL HIGH (ref 70–99)
Potassium: 3.8 mmol/L (ref 3.5–5.1)
Sodium: 137 mmol/L (ref 135–145)

## 2019-05-26 LAB — TROPONIN I (HIGH SENSITIVITY): Troponin I (High Sensitivity): 3 ng/L (ref <18)

## 2019-05-26 NOTE — ED Notes (Signed)
Pt down the hallway again demanding to leave. Spoke with the [place] and he is his own poa.

## 2019-05-26 NOTE — ED Triage Notes (Signed)
Pt sent in by the [facility] for cp - no pain presently but states sob. Was refusing treatment but allowed ekg. Placed on oxygen when spo2 was 85%. Refusing bloodwork.

## 2019-05-26 NOTE — ED Notes (Signed)
Dr. Archie Balboa spoke with pt - pt can sign out ama and can wait in the lobby for a ride. Is not authorizing an ambulance. Pt is alert, oriented and makes his own decisions.

## 2019-05-26 NOTE — ED Notes (Signed)
Pt took off oxygen and monitor and is in the hallway refusing to go back to room. Refusing chest xr. Dr made aware.

## 2019-05-26 NOTE — ED Provider Notes (Signed)
[facility] Emergency Department Provider Note   ____________________________________________   I have reviewed the triage vital signs and the nursing notes.   HISTORY  Chief Complaint Chest pain  History limited by and level 5 caveat due to: Patient declines examination   HPI Eric [name] is a 73 y.o. male who presents to the emergency department today because of concern for chest pain. Patient stated he did not want any evaluation done. Says he wants to be taken back home.   Records reviewed. Per medical record review patient has a history of multiple recent ER visits for chest pain.  Past Medical History:  Diagnosis Date  . Acute respiratory failure with hypoxia (HCC)    multiple prior admissions to [facility] and [facility] for CHF exacerbation / respiratory failure  . Anxiety and depression   . Atrial fibrillation (HCC) 07/25/2018   new-onset during [facility] admission in Jan 2020  . Cataract   . CHF (congestive heart failure) (HCC)    EF <= 50% as of Jan 2020  . Current use of long term anticoagulation 07/2018   Started on apixaban for new-onset a-fib in Jan 2020  . Dementia (HCC)    possible, likely age-related, documented decreased cognitive function on prior hospitalizations  . Gallstones   . Glaucoma   . Hypertension   . Hypoglycemia   . Hypothyroidism   . Inguinal hernia   . Sleep apnea   . Thyroid disease     Patient Active Problem List   Diagnosis Date Noted  . Chronic combined systolic and diastolic CHF (congestive heart failure) (HCC) 11/16/2018  . Anasarca 11/16/2018  . Hypothyroidism 11/16/2018  . PAF (paroxysmal atrial fibrillation) (HCC) 11/16/2018  . HTN (hypertension) 11/16/2018  . Generalized anxiety disorder 11/03/2018  . Major depression in partial remission (HCC) 11/03/2018  . Acute hypoxemic respiratory failure (HCC) 10/29/2018  . Suspected COVID-19 virus infection 10/29/2018  . Delirium     Past Surgical History:   Procedure Laterality Date  . FINGER AMPUTATION Right     Prior to Admission medications   Medication Sig Start Date End Date Taking? Authorizing Provider  acetaminophen (TYLENOL) 325 MG tablet Take 2 tablets (650 mg total) by mouth every 6 (six) hours as needed for mild pain (or Fever >/= 101). 11/03/18   Alford Highland, MD  albuterol (ACCUNEB) 0.63 MG/3ML nebulizer solution Take 1 ampule by nebulization every 6 (six) hours as needed for shortness of breath.     [provider]  ANORO ELLIPTA 62.5-25 MCG/INH AEPB Inhale 1 puff into the lungs every 6 (six) hours. 10/12/18   [provider]  bimatoprost (LUMIGAN) 0.01 % SOLN Place 1 drop into both eyes at bedtime.    [provider]  cyanocobalamin 500 MCG tablet Take 500 mcg by mouth 3 (three) times a week.    [provider]  diazepam (VALIUM) 10 MG tablet Take 1 tablet (10 mg total) by mouth every 6 (six) hours as needed for anxiety. 11/03/18   Alford Highland, MD  doxycycline (VIBRAMYCIN) 100 MG capsule Take 1 capsule (100 mg total) by mouth 2 (two) times daily. Patient not taking: Reported on 05/12/2019 01/18/19   Sharman Cheek, MD  furosemide (LASIX) 20 MG tablet Take 1 tablet (20 mg total) by mouth daily. 11/04/18   Alford Highland, MD  levothyroxine (SYNTHROID) 150 MCG tablet Take 1 tablet (150 mcg total) by mouth daily. 11/03/18 11/03/19  Alford Highland, MD  liver oil-zinc oxide (DESITIN) 40 % ointment Apply  topically daily. 11/04/18   Loletha Grayer, MD  Melatonin 3 MG TABS Take 1 tablet by mouth daily.    [provider]  metoprolol succinate (TOPROL-XL) 25 MG 24 hr tablet Take 1 tablet (25 mg total) by mouth daily. 11/04/18   Loletha Grayer, MD  OLANZapine zydis (ZYPREXA) 10 MG disintegrating tablet Take 1 tablet (10 mg total) by mouth 2 (two) times daily. 11/03/18   Loletha Grayer, MD  senna-docusate (SENEXON-S) 8.6-50 MG tablet Take 2 tablets by mouth 2 (two) times daily.     [provider]  sertraline (ZOLOFT) 50 MG tablet Take 1 tablet (50 mg total) by mouth daily. 11/03/18   Loletha Grayer, MD  Vitamin D, Ergocalciferol, (DRISDOL) 1.25 MG (50000 UT) CAPS capsule Take 50,000 Units by mouth every 7 (seven) days.    [provider]    Allergies Demerol [meperidine], Epinephrine, Nardil [phenelzine], Morphine and related, Ondansetron, and Shellfish allergy  Family History  Problem Relation Age of Onset  . Alzheimer's disease Mother     Social History Social History   Tobacco Use  . Smoking status: Former Smoker    Quit date: 10/18/2010    Years since quitting: 8.6  . Smokeless tobacco: Never Used  Substance Use Topics  . Alcohol use: No  . Drug use: No    Review of Systems Limited secondary to patient non cooperativeness. Cardiovascular: Positive chest pain. Respiratory: Positive shortness of breath. ____________________________________________   PHYSICAL EXAM: Limited. Patient refusing exam.  VITAL SIGNS: ED Triage Vitals  Enc Vitals Group     BP 05/26/19 2025 (!) 158/86     Pulse Rate 05/26/19 2025 78     Resp 05/26/19 2025 18     Temp 05/26/19 2025 98.4 F (36.9 C)     Temp src --      SpO2 05/26/19 2025 97 %     Weight 05/26/19 2027 230 lb (104.3 kg)     Height 05/26/19 2027 5\' 6"  (1.676 m)     Head Circumference --      Peak Flow --      Pain Score 05/26/19 2026 0   Constitutional: Alert and oriented.  ENT      Head: Normocephalic Respiratory: Normal respiratory effort without tachypnea nor retractions.  Musculoskeletal: Normal range of motion in all extremities.  Neurologic:  Normal speech and language.  Psychiatric: Upset  ____________________________________________    LABS (pertinent positives/negatives)  Trop hs 3 BMP wnl except glu 117, ca 8.6 CBC wbc 13.7, hgb 13.9, plt 177  ____________________________________________   EKG  I, Nance Pear, attending physician, personally viewed and  interpreted this EKG  EKG Time: 2024 Rate: 78 Rhythm: sinus rhythm Axis: normal Intervals: qtc 439 QRS: narrow, q waves v1, v2 ST changes: no st elevation Impression: abnormal ekg  ____________________________________________    RADIOLOGY  CXR No acute abnormality  ____________________________________________   PROCEDURES  Procedures  ____________________________________________   INITIAL IMPRESSION / ASSESSMENT AND PLAN / ED COURSE  Pertinent labs & imaging results that were available during my care of the patient were reviewed by me and considered in my medical decision making (see chart for details).   Patient presented to the emergency department today because of concerns for chest pain.  However patient declined exam or work-up.  It was unclear why he declined it.  He states that he was taken to this hospital and he does not want to be evaluated at this hospital.  Did explain that EMS will transport to  the nearest hospital.  Did discuss with patient my concern that the chest pain could be related to a heart attack, pneumonia.  That these diagnoses can have deadly prognosis if untreated.  Patient verbalized understanding however continued to refuse care.  Shortly after my exam apparently he told nursing staff that he would want to be examined.  When I went back to talk to him he said he would be willing to have some blood work and work-up done.  Blood work without concerning findings.  Chest x-ray and EKG without concerning acute abnormality.  I discussed this with the patient.  Plan on discharging home.  ____________________________________________   FINAL CLINICAL IMPRESSION(S) / ED DIAGNOSES  Final diagnoses:  Chest pain, unspecified type     Note: This dictation was prepared with Dragon dictation. Any transcriptional errors that result from this process are unintentional     Phineas SemenGoodman, Kianna Billet, MD 05/27/19 1541

## 2019-05-26 NOTE — Discharge Instructions (Signed)
Please seek medical attention for any high fevers, chest pain, shortness of breath, change in behavior, persistent vomiting, bloody stool or any other new or concerning symptoms.  

## 2019-05-26 NOTE — ED Notes (Signed)
Pt allowed zach to draw blood with a butterfly needle. Still sitting in w/c refusing to get in bed and be placed on monitor.

## 2019-05-27 NOTE — ED Notes (Signed)
Discharge instructions reviewed with pt and called to caregiver at the [facility]. Pt unable to sign discharge e sig.

## 2019-05-27 NOTE — ED Notes (Signed)
[facility] called for transport to [place]

## 2019-06-05 ENCOUNTER — Emergency Department
Admission: EM | Admit: 2019-06-05 | Discharge: 2019-06-05 | Disposition: A | Discharge status: Skilled Nursing Facility | Payer: Medicare Other | Attending: Emergency Medicine | Admitting: Emergency Medicine

## 2019-06-05 ENCOUNTER — Encounter: Payer: Self-pay | Admitting: Emergency Medicine

## 2019-06-05 ENCOUNTER — Other Ambulatory Visit: Payer: Self-pay

## 2019-06-05 DIAGNOSIS — R531 Weakness: Secondary | ICD-10-CM | POA: Diagnosis present

## 2019-06-05 DIAGNOSIS — I110 Hypertensive heart disease with heart failure: Secondary | ICD-10-CM | POA: Insufficient documentation

## 2019-06-05 DIAGNOSIS — Z87891 Personal history of nicotine dependence: Secondary | ICD-10-CM | POA: Insufficient documentation

## 2019-06-05 DIAGNOSIS — M6281 Muscle weakness (generalized): Secondary | ICD-10-CM | POA: Diagnosis not present

## 2019-06-05 DIAGNOSIS — I5042 Chronic combined systolic (congestive) and diastolic (congestive) heart failure: Secondary | ICD-10-CM | POA: Diagnosis not present

## 2019-06-05 DIAGNOSIS — E039 Hypothyroidism, unspecified: Secondary | ICD-10-CM | POA: Diagnosis not present

## 2019-06-05 DIAGNOSIS — J449 Chronic obstructive pulmonary disease, unspecified: Secondary | ICD-10-CM | POA: Insufficient documentation

## 2019-06-05 DIAGNOSIS — I509 Heart failure, unspecified: Secondary | ICD-10-CM

## 2019-06-05 DIAGNOSIS — Z79899 Other long term (current) drug therapy: Secondary | ICD-10-CM | POA: Diagnosis not present

## 2019-06-05 NOTE — ED Notes (Signed)
MD in room to assess pt upon arrival. See MD assessment for further information.

## 2019-06-05 NOTE — ED Triage Notes (Signed)
Pt in via EMS from [facility] with c/o wakness. 140/90 BP, 94HR, on O2 PRN as needed.

## 2019-06-05 NOTE — ED Provider Notes (Signed)
[facility] Emergency Department Provider Note  ____________________________________________  Time seen: Approximately 4:28 PM  I have reviewed the triage vital signs and the nursing notes.   HISTORY  Chief Complaint Weakness    HPI Eric [name] is a 73 y.o. male with a history of atrial fibrillation, CHF, COPD, hypertension sent to the ED from the [place] [facility] [place] for generalized weakness.  Patient denies any symptoms and adamantly states that he does not wish to be evaluated and wants to be sent back home.  Denies shortness of breath or chest pain.  Denies cough or fever.  States that he feels fine.  Reports compliance with his medications.  MAR is sent with the patient confirms this.    Past Medical History:  Diagnosis Date  . Acute respiratory failure with hypoxia (HCC)    multiple prior admissions to [facility] and [facility] for CHF exacerbation / respiratory failure  . Anxiety and depression   . Atrial fibrillation (HCC) 07/25/2018   new-onset during [facility] admission in Jan 2020  . Cataract   . CHF (congestive heart failure) (HCC)    EF <= 50% as of Jan 2020  . Current use of long term anticoagulation 07/2018   Started on apixaban for new-onset a-fib in Jan 2020  . Dementia (HCC)    possible, likely age-related, documented decreased cognitive function on prior hospitalizations  . Gallstones   . Glaucoma   . Hypertension   . Hypoglycemia   . Hypothyroidism   . Inguinal hernia   . Sleep apnea   . Thyroid disease      Patient Active Problem List   Diagnosis Date Noted  . Chronic combined systolic and diastolic CHF (congestive heart failure) (HCC) 11/16/2018  . Anasarca 11/16/2018  . Hypothyroidism 11/16/2018  . PAF (paroxysmal atrial fibrillation) (HCC) 11/16/2018  . HTN (hypertension) 11/16/2018  . Generalized anxiety disorder 11/03/2018  . Major depression in partial remission (HCC) 11/03/2018  . Acute hypoxemic respiratory failure (HCC)  10/29/2018  . Suspected COVID-19 virus infection 10/29/2018  . Delirium      Past Surgical History:  Procedure Laterality Date  . FINGER AMPUTATION Right      Prior to Admission medications   Medication Sig Start Date End Date Taking? Authorizing Provider  acetaminophen (TYLENOL) 325 MG tablet Take 2 tablets (650 mg total) by mouth every 6 (six) hours as needed for mild pain (or Fever >/= 101). 11/03/18   Alford Highland, MD  albuterol (ACCUNEB) 0.63 MG/3ML nebulizer solution Take 1 ampule by nebulization every 6 (six) hours as needed for shortness of breath.     [provider]  ANORO ELLIPTA 62.5-25 MCG/INH AEPB Inhale 1 puff into the lungs every 6 (six) hours. 10/12/18   [provider]  bimatoprost (LUMIGAN) 0.01 % SOLN Place 1 drop into both eyes at bedtime.    [provider]  cyanocobalamin 500 MCG tablet Take 500 mcg by mouth 3 (three) times a week.    [provider]  diazepam (VALIUM) 10 MG tablet Take 1 tablet (10 mg total) by mouth every 6 (six) hours as needed for anxiety. 11/03/18   Alford Highland, MD  doxycycline (VIBRAMYCIN) 100 MG capsule Take 1 capsule (100 mg total) by mouth 2 (two) times daily. Patient not taking: Reported on 05/12/2019 01/18/19   Sharman Cheek, MD  furosemide (LASIX) 20 MG tablet Take 1 tablet (20 mg total) by mouth daily. 11/04/18   Alford Highland, MD  levothyroxine (SYNTHROID) 150 MCG tablet  Take 1 tablet (150 mcg total) by mouth daily. 11/03/18 11/03/19  Loletha Grayer, MD  liver oil-zinc oxide (DESITIN) 40 % ointment Apply topically daily. 11/04/18   Loletha Grayer, MD  Melatonin 3 MG TABS Take 1 tablet by mouth daily.    [provider]  metoprolol succinate (TOPROL-XL) 25 MG 24 hr tablet Take 1 tablet (25 mg total) by mouth daily. 11/04/18   Loletha Grayer, MD  OLANZapine zydis (ZYPREXA) 10 MG disintegrating tablet Take 1 tablet (10 mg total) by mouth 2 (two) times daily. 11/03/18   Loletha Grayer, MD  senna-docusate (SENEXON-S) 8.6-50 MG tablet Take 2 tablets by mouth 2 (two) times daily.    [provider]  sertraline (ZOLOFT) 50 MG tablet Take 1 tablet (50 mg total) by mouth daily. 11/03/18   Loletha Grayer, MD  Vitamin D, Ergocalciferol, (DRISDOL) 1.25 MG (50000 UT) CAPS capsule Take 50,000 Units by mouth every 7 (seven) days.    [provider]     Allergies Demerol [meperidine], Epinephrine, Nardil [phenelzine], Morphine and related, Ondansetron, and Shellfish allergy   Family History  Problem Relation Age of Onset  . Alzheimer's disease Mother     Social History Social History   Tobacco Use  . Smoking status: Former Smoker    Quit date: 10/18/2010    Years since quitting: 8.6  . Smokeless tobacco: Never Used  Substance Use Topics  . Alcohol use: No  . Drug use: No    Review of Systems  Constitutional:   No fever or chills.  ENT:   No sore throat. No rhinorrhea. Cardiovascular:   No chest pain or syncope. Respiratory:   No dyspnea or cough. Gastrointestinal:   Negative for abdominal pain, vomiting and diarrhea.  Musculoskeletal: Chronic leg swelling, currently improved from baseline All other systems reviewed and are negative except as documented above in ROS and HPI.  ____________________________________________   PHYSICAL EXAM:  VITAL SIGNS: ED Triage Vitals [06/05/19 1615]  Enc Vitals Group     BP 125/75     Pulse Rate 79     Resp 20     Temp (!) 97.4 F (36.3 C)     Temp Source Oral     SpO2 93 %     Weight 200 lb (90.7 kg)     Height 5\' 7"  (1.702 m)     Head Circumference      Peak Flow      Pain Score 0     Pain Loc      Pain Edu?      Excl. in [place]?     Vital signs reviewed, nursing assessments reviewed.   Constitutional:   Alert and oriented. Non-toxic appearance. Eyes:   Conjunctivae are normal. EOMI. PERRL. ENT      Head:   Normocephalic and atraumatic.      Nose: Normal      Mouth/Throat: Moist  mucous membranes      Neck:   No meningismus. Full ROM. Hematological/Lymphatic/Immunilogical:   No cervical lymphadenopathy. Cardiovascular:   RRR. Symmetric bilateral radial and DP pulses.  No murmurs. Cap refill less than 2 seconds. Respiratory:   Normal respiratory effort without tachypnea/retractions. Breath sounds are clear and equal bilaterally. No wheezes/rales/rhonchi.  Musculoskeletal:   Normal range of motion in all extremities. No joint effusions.  No lower extremity tenderness.  Brawny skin thickening, 1+ pitting edema bilateral lower extremities. Neurologic:   Normal speech and language.  Motor grossly intact. No acute focal neurologic deficits  are appreciated.  Skin:    Skin is warm, dry and intact. No rash noted.  No petechiae, purpura, or bullae.  ____________________________________________    LABS (pertinent positives/negatives) (all labs ordered are listed, but only abnormal results are displayed) Labs Reviewed  BASIC METABOLIC PANEL  CBC  URINALYSIS, COMPLETE (UACMP) WITH MICROSCOPIC   ____________________________________________   EKG  Interpreted by me  Date: 06/05/2019  Rate: 78  Rhythm: normal sinus rhythm  QRS Axis: normal  Intervals: normal  ST/T Wave abnormalities: normal  Conduction Disutrbances: none  Narrative Interpretation: unremarkable      ____________________________________________    RADIOLOGY  No results found.  ____________________________________________   PROCEDURES Procedures  ____________________________________________  DIFFERENTIAL DIAGNOSIS   Pneumonia, COVID-19, urinary tract infection, dehydration, electrolyte abnormality, viral illness, sleep deprivation, overexertion  CLINICAL IMPRESSION / ASSESSMENT AND PLAN / ED COURSE  Medications ordered in the ED: Medications - No data to display  Pertinent labs & imaging results that were available during my care of the patient were reviewed by me and considered  in my medical decision making (see chart for details).  Kary KosDaniel Schedler [name] was evaluated in Emergency Department on 06/05/2019 for the symptoms described in the history of present illness. He was evaluated in the context of the global COVID-19 pandemic, which necessitated consideration that the patient might be at risk for infection with the SARS-CoV-2 virus that causes COVID-19. Institutional protocols and algorithms that pertain to the evaluation of patients at risk for COVID-19 are in a state of rapid change based on information released by regulatory bodies including the CDC and federal and state organizations. These policies and algorithms were followed during the patient's care in the ED.   Patient sent to the ED for evaluation from the [place] [place] [facility] [place].  However he reports that he was picked up and sent to the hospital without being informed.  He appears to have medical decision-making capacity at this time, and he refuses to be evaluated and wishes to be discharged right away.  His vital signs and examination are unremarkable.  His EKG is normal.  I did discuss with him the multiple concerns related to his serious chronic medical conditions including heart attack, pneumonia, dehydration, abnormal electrolyte levels.  However, he is an persuaded to allow chest x-ray and lab draws.  We will comply with his wishes and discharge.      ____________________________________________   FINAL CLINICAL IMPRESSION(S) / ED DIAGNOSES    Final diagnoses:  Generalized weakness  Chronic obstructive pulmonary disease, unspecified COPD type (HCC)  Chronic congestive heart failure  ED Discharge Orders    None      Portions of this note were generated with dragon dictation software. Dictation errors may occur despite best attempts at proofreading.   Sharman CheekStafford, Legrande Hao, MD 06/05/19 [phone]

## 2019-06-05 NOTE — ED Notes (Signed)
Called [facility] to get the patient a ride home. They will call back with their availability.

## 2019-06-05 NOTE — ED Triage Notes (Signed)
Pt reports the place he lives called EMS because he was weak. Pt states that he does not want to be seen or to stay here.

## 2019-06-05 NOTE — ED Notes (Signed)
Pt verbalizes understanding of d/c instructions and follow up. [facility] called for transport home.

## 2019-06-06 ENCOUNTER — Encounter: Payer: Self-pay | Admitting: *Deleted

## 2019-06-06 ENCOUNTER — Other Ambulatory Visit: Payer: Self-pay

## 2019-06-06 DIAGNOSIS — Z87891 Personal history of nicotine dependence: Secondary | ICD-10-CM | POA: Diagnosis not present

## 2019-06-06 DIAGNOSIS — I110 Hypertensive heart disease with heart failure: Secondary | ICD-10-CM | POA: Insufficient documentation

## 2019-06-06 DIAGNOSIS — E039 Hypothyroidism, unspecified: Secondary | ICD-10-CM | POA: Insufficient documentation

## 2019-06-06 DIAGNOSIS — Z79899 Other long term (current) drug therapy: Secondary | ICD-10-CM | POA: Insufficient documentation

## 2019-06-06 DIAGNOSIS — I5042 Chronic combined systolic (congestive) and diastolic (congestive) heart failure: Secondary | ICD-10-CM | POA: Insufficient documentation

## 2019-06-06 DIAGNOSIS — R0789 Other chest pain: Secondary | ICD-10-CM | POA: Insufficient documentation

## 2019-06-06 DIAGNOSIS — F4325 Adjustment disorder with mixed disturbance of emotions and conduct: Secondary | ICD-10-CM | POA: Diagnosis not present

## 2019-06-06 DIAGNOSIS — F0390 Unspecified dementia without behavioral disturbance: Secondary | ICD-10-CM | POA: Insufficient documentation

## 2019-06-06 MED ORDER — SODIUM CHLORIDE 0.9% FLUSH: 3.0000 mL | Freq: Once | INTRAVENOUS | Status: DC

## 2019-06-06 NOTE — ED Triage Notes (Signed)
Pt says that he has been having left sided chest pain since waking this morning. Pt arrives via EMS from the [facility] [place].

## 2019-06-07 ENCOUNTER — Emergency Department: Payer: Medicare Other

## 2019-06-07 ENCOUNTER — Emergency Department
Admission: EM | Admit: 2019-06-07 | Discharge: 2019-06-07 | Disposition: A | Discharge status: Home / Self Care | Payer: Medicare Other | Attending: Emergency Medicine | Admitting: Emergency Medicine

## 2019-06-07 DIAGNOSIS — I110 Hypertensive heart disease with heart failure: Secondary | ICD-10-CM | POA: Insufficient documentation

## 2019-06-07 DIAGNOSIS — Z87891 Personal history of nicotine dependence: Secondary | ICD-10-CM | POA: Insufficient documentation

## 2019-06-07 DIAGNOSIS — F0390 Unspecified dementia without behavioral disturbance: Secondary | ICD-10-CM | POA: Insufficient documentation

## 2019-06-07 DIAGNOSIS — R079 Chest pain, unspecified: Secondary | ICD-10-CM

## 2019-06-07 DIAGNOSIS — E039 Hypothyroidism, unspecified: Secondary | ICD-10-CM | POA: Insufficient documentation

## 2019-06-07 DIAGNOSIS — I4891 Unspecified atrial fibrillation: Secondary | ICD-10-CM | POA: Insufficient documentation

## 2019-06-07 DIAGNOSIS — R0789 Other chest pain: Secondary | ICD-10-CM | POA: Diagnosis not present

## 2019-06-07 DIAGNOSIS — I509 Heart failure, unspecified: Secondary | ICD-10-CM | POA: Insufficient documentation

## 2019-06-07 DIAGNOSIS — Z5320 Procedure and treatment not carried out because of patient's decision for unspecified reasons: Secondary | ICD-10-CM | POA: Insufficient documentation

## 2019-06-07 DIAGNOSIS — Z79899 Other long term (current) drug therapy: Secondary | ICD-10-CM | POA: Insufficient documentation

## 2019-06-07 LAB — TROPONIN I (HIGH SENSITIVITY): Troponin I (High Sensitivity): 4 ng/L (ref <18)

## 2019-06-07 NOTE — ED Notes (Signed)
EDP informed pt about need for CT scan. Pt refused and stated that he just wants to go home. EDP made aware.   Transportation home has been contacted.

## 2019-06-07 NOTE — ED Notes (Signed)
Pt sitting in lobby with no distress noted; updated on wait time and plan of care--voices good understanding; taken to triage for repeat vs and troponin

## 2019-06-07 NOTE — ED Provider Notes (Signed)
[facility] Emergency Department Provider Note _____   First MD Initiated Contact with Patient 06/07/19 [phone]     (approximate)  I have reviewed the triage vital signs and the nursing notes.   HISTORY  Chief Complaint Chest Pain   HPI Eric [name] is a 73 y.o. male with below list of previous medical conditions including CHF A. fib COPD and hypertension who resides at the [place] of Eric [name] presents to the emergency department secondary to intermittent left-sided nonradiating chest pain which patient states is completely resolved at this time.  Patient states that he no longer wishes to be evaluated.  Patient denies any lower extremity pain.  Patient denies any dizziness nausea vomiting hemoptysis or any other concerns.       Past Medical History:  Diagnosis Date   Acute respiratory failure with hypoxia (HCC)    multiple prior admissions to [facility] and [facility] for CHF exacerbation / respiratory failure   Anxiety and depression    Atrial fibrillation (HCC) 07/25/2018   new-onset during [facility] admission in Jan 2020   Cataract    CHF (congestive heart failure) (HCC)    EF <= 50% as of Jan 2020   Current use of long term anticoagulation 07/2018   Started on apixaban for new-onset a-fib in Jan 2020   Dementia [facility])    possible, likely age-related, documented decreased cognitive function on prior hospitalizations   Gallstones    Glaucoma    Hypertension    Hypoglycemia    Hypothyroidism    Inguinal hernia    Sleep apnea    Thyroid disease     Patient Active Problem List   Diagnosis Date Noted   Chronic combined systolic and diastolic CHF (congestive heart failure) (HCC) 11/16/2018   Anasarca 11/16/2018   Hypothyroidism 11/16/2018   PAF (paroxysmal atrial fibrillation) (HCC) 11/16/2018   HTN (hypertension) 11/16/2018   Generalized anxiety disorder 11/03/2018   Major depression in partial remission (HCC) 11/03/2018   Acute  hypoxemic respiratory failure (HCC) 10/29/2018   Suspected COVID-19 virus infection 10/29/2018   Delirium     Past Surgical History:  Procedure Laterality Date   FINGER AMPUTATION Right     Prior to Admission medications   Medication Sig Start Date End Date Taking? Authorizing Provider  acetaminophen (TYLENOL) 325 MG tablet Take 2 tablets (650 mg total) by mouth every 6 (six) hours as needed for mild pain (or Fever >/= 101). 11/03/18   Alford Highland, MD  albuterol (ACCUNEB) 0.63 MG/3ML nebulizer solution Take 1 ampule by nebulization every 6 (six) hours as needed for shortness of breath.     [provider]  ANORO ELLIPTA 62.5-25 MCG/INH AEPB Inhale 1 puff into the lungs every 6 (six) hours. 10/12/18   [provider]  bimatoprost (LUMIGAN) 0.01 % SOLN Place 1 drop into both eyes at bedtime.    [provider]  cyanocobalamin 500 MCG tablet Take 500 mcg by mouth 3 (three) times a week.    [provider]  diazepam (VALIUM) 10 MG tablet Take 1 tablet (10 mg total) by mouth every 6 (six) hours as needed for anxiety. 11/03/18   Alford Highland, MD  doxycycline (VIBRAMYCIN) 100 MG capsule Take 1 capsule (100 mg total) by mouth 2 (two) times daily. Patient not taking: Reported on 05/12/2019 01/18/19   Sharman Cheek, MD  furosemide (LASIX) 20 MG tablet Take 1 tablet (20 mg total) by mouth daily. 11/04/18   Alford Highland, MD  levothyroxine (SYNTHROID)  150 MCG tablet Take 1 tablet (150 mcg total) by mouth daily. 11/03/18 11/03/19  Alford HighlandWieting, Richard, MD  liver oil-zinc oxide (DESITIN) 40 % ointment Apply topically daily. 11/04/18   Alford HighlandWieting, Richard, MD  Melatonin 3 MG TABS Take 1 tablet by mouth daily.    [provider]  metoprolol succinate (TOPROL-XL) 25 MG 24 hr tablet Take 1 tablet (25 mg total) by mouth daily. 11/04/18   Alford HighlandWieting, Richard, MD  OLANZapine zydis (ZYPREXA) 10 MG disintegrating tablet Take 1 tablet (10 mg total) by mouth 2 (two) times  daily. 11/03/18   Alford HighlandWieting, Richard, MD  senna-docusate (SENEXON-S) 8.6-50 MG tablet Take 2 tablets by mouth 2 (two) times daily.    [provider]  sertraline (ZOLOFT) 50 MG tablet Take 1 tablet (50 mg total) by mouth daily. 11/03/18   Alford HighlandWieting, Richard, MD  Vitamin D, Ergocalciferol, (DRISDOL) 1.25 MG (50000 UT) CAPS capsule Take 50,000 Units by mouth every 7 (seven) days.    [provider]    Allergies Demerol [meperidine], Epinephrine, Nardil [phenelzine], Morphine and related, Ondansetron, and Shellfish allergy  Family History  Problem Relation Age of Onset   Alzheimer's disease Mother     Social History Social History   Tobacco Use   Smoking status: Former Smoker    Quit date: 10/18/2010    Years since quitting: 8.6   Smokeless tobacco: Never Used  Substance Use Topics   Alcohol use: No   Drug use: No    Review of Systems Constitutional: No fever/chills Eyes: No visual changes. ENT: No sore throat. Cardiovascular: Positive for chest pain now resolved.Marland Kitchen. Respiratory: Denies shortness of breath. Gastrointestinal: No abdominal pain.  No nausea, no vomiting.  No diarrhea.  No constipation. Genitourinary: Negative for dysuria. Musculoskeletal: Negative for neck pain.  Negative for back pain. Integumentary: Negative for rash. Neurological: Negative for headaches, focal weakness or numbness.   ____________________________________________   PHYSICAL EXAM:  VITAL SIGNS: ED Triage Vitals [06/06/19 2308]  Enc Vitals Group     BP (!) 153/81     Pulse Rate 78     Resp (!) 22     Temp 98.2 F (36.8 C)     Temp Source Oral     SpO2 92 %     Weight      Height      Head Circumference      Peak Flow      Pain Score 7     Pain Loc      Pain Edu?      Excl. in GC?     Constitutional: Alert and oriented.  Eyes: Conjunctivae are normal.  Mouth/Throat: Patient is wearing a mask. Neck: No stridor.  No meningeal signs.   Cardiovascular: Normal  rate, regular rhythm. Good peripheral circulation. Grossly normal heart sounds. Respiratory: Normal respiratory effort.  No retractions. Gastrointestinal: Soft and nontender. No distention.  Musculoskeletal: Compressive dressing applied to bilateral lower extremity up to the knee Neurologic:  Normal speech and language. No gross focal neurologic deficits are appreciated.  Skin:  Skin is warm, dry and intact. Psychiatric: Mood and affect are normal. Speech and behavior are normal.  ____________________________________________   LABS (all labs ordered are listed, but only abnormal results are displayed)  Labs Reviewed  BASIC METABOLIC PANEL  CBC  TROPONIN I (HIGH SENSITIVITY)  TROPONIN I (HIGH SENSITIVITY)   ____________________________________________  EKG  ED ECG REPORT I, [place] N Landon Truax, the attending physician, personally viewed and interpreted this ECG.   Date:  06/06/2019  EKG Time: 11:18 PM  Rate: 72  Rhythm: Normal sinus rhythm  Axis: Normal  Intervals: Normal  ST&T Change: None  ____________________________________________  RADIOLOGY I, [place] N Catcher Dehoyos, personally viewed and evaluated these images (plain radiographs) as part of my medical decision making, as well as reviewing the written report by the radiologist.  ED MD interpretation: COPD, no active disease noted on chest x-ray per radiologist.  Official radiology report(s): Dg Chest 2 View  Result Date: 06/07/2019 CLINICAL DATA:  Left side chest pain EXAM: CHEST - 2 VIEW COMPARISON:  05/26/2019 FINDINGS: There is hyperinflation of the lungs compatible with COPD. Scarring in the bases. No acute confluent airspace opacity or effusion. Heart is mildly enlarged. No acute bony abnormality. IMPRESSION: COPD/chronic changes.  No active disease. Electronically Signed   By: Rolm Baptise M.D.   On: 06/07/2019 02:57      Procedures   ____________________________________________   INITIAL IMPRESSION / MDM /  [place] / ED COURSE  As part of my medical decision making, I reviewed the following data within the electronic MEDICAL RECORD NUMBER  73 year old male presenting with above-stated history and physical exam concerning for possible CAD versus PE.  EKG revealed no evidence of ischemia or infarction.  Laboratory data including high-sensitivity troponin negative.  I spoke with the patient regarding performing a CT angiogram of the chest to evaluate for PE however the patient refused      ____________________________________________  FINAL CLINICAL IMPRESSION(S) / ED DIAGNOSES  Final diagnoses:  Chest pain, unspecified type     MEDICATIONS GIVEN DURING THIS VISIT:  Medications - No data to display   ED Discharge Orders    None      *Please note:  Eric [name] was evaluated in Emergency Department on 06/07/2019 for the symptoms described in the history of present illness. He was evaluated in the context of the global COVID-19 pandemic, which necessitated consideration that the patient might be at risk for infection with the SARS-CoV-2 virus that causes COVID-19. Institutional protocols and algorithms that pertain to the evaluation of patients at risk for COVID-19 are in a state of rapid change based on information released by regulatory bodies including the CDC and federal and state organizations. These policies and algorithms were followed during the patient's care in the ED.  Some ED evaluations and interventions may be delayed as a result of limited staffing during the pandemic.*  Note:  This document was prepared using Dragon voice recognition software and may include unintentional dictation errors.   Gregor Hams, MD 06/07/19 [phone]

## 2019-06-08 ENCOUNTER — Emergency Department
Admission: EM | Admit: 2019-06-08 | Discharge: 2019-06-09 | Disposition: A | Discharge status: Skilled Nursing Facility | Payer: Medicare Other | Attending: Emergency Medicine | Admitting: Emergency Medicine

## 2019-06-08 ENCOUNTER — Other Ambulatory Visit: Payer: Self-pay

## 2019-06-08 ENCOUNTER — Emergency Department
Admission: EM | Admit: 2019-06-08 | Discharge: 2019-06-08 | Disposition: A | Discharge status: Home / Self Care | Payer: Medicare Other | Source: Home / Self Care | Attending: Emergency Medicine | Admitting: Emergency Medicine

## 2019-06-08 ENCOUNTER — Encounter: Payer: Self-pay | Admitting: Emergency Medicine

## 2019-06-08 DIAGNOSIS — F329 Major depressive disorder, single episode, unspecified: Secondary | ICD-10-CM | POA: Diagnosis not present

## 2019-06-08 DIAGNOSIS — E039 Hypothyroidism, unspecified: Secondary | ICD-10-CM | POA: Diagnosis not present

## 2019-06-08 DIAGNOSIS — F324 Major depressive disorder, single episode, in partial remission: Secondary | ICD-10-CM | POA: Diagnosis present

## 2019-06-08 DIAGNOSIS — F4325 Adjustment disorder with mixed disturbance of emotions and conduct: Secondary | ICD-10-CM | POA: Diagnosis present

## 2019-06-08 DIAGNOSIS — F0390 Unspecified dementia without behavioral disturbance: Secondary | ICD-10-CM

## 2019-06-08 DIAGNOSIS — I110 Hypertensive heart disease with heart failure: Secondary | ICD-10-CM | POA: Insufficient documentation

## 2019-06-08 DIAGNOSIS — R0789 Other chest pain: Secondary | ICD-10-CM | POA: Diagnosis not present

## 2019-06-08 DIAGNOSIS — F411 Generalized anxiety disorder: Secondary | ICD-10-CM | POA: Diagnosis not present

## 2019-06-08 DIAGNOSIS — F0391 Unspecified dementia with behavioral disturbance: Secondary | ICD-10-CM

## 2019-06-08 DIAGNOSIS — I5042 Chronic combined systolic (congestive) and diastolic (congestive) heart failure: Secondary | ICD-10-CM | POA: Diagnosis not present

## 2019-06-08 DIAGNOSIS — Z79899 Other long term (current) drug therapy: Secondary | ICD-10-CM | POA: Insufficient documentation

## 2019-06-08 DIAGNOSIS — R4182 Altered mental status, unspecified: Secondary | ICD-10-CM | POA: Diagnosis present

## 2019-06-08 LAB — COMPREHENSIVE METABOLIC PANEL
ALT: 9 U/L (ref 0–44)
AST: 11 U/L — ABNORMAL LOW (ref 15–41)
Albumin: 3.5 g/dL (ref 3.5–5.0)
Alkaline Phosphatase: 75 U/L (ref 38–126)
Anion gap: 7 (ref 5–15)
BUN: 17 mg/dL (ref 8–23)
CO2: 27 mmol/L (ref 22–32)
Calcium: 8.4 mg/dL — ABNORMAL LOW (ref 8.9–10.3)
Chloride: 106 mmol/L (ref 98–111)
Creatinine, Ser: 0.92 mg/dL (ref 0.61–1.24)
GFR calc Af Amer: >60 mL/min (ref >60)
GFR calc non Af Amer: >60 mL/min (ref >60)
Glucose, Bld: 97 mg/dL (ref 70–99)
Potassium: 4.3 mmol/L (ref 3.5–5.1)
Sodium: 140 mmol/L (ref 135–145)
Total Bilirubin: 1 mg/dL (ref 0.3–1.2)
Total Protein: 7 g/dL (ref 6.5–8.1)

## 2019-06-08 LAB — CBC
HCT: 41.4 % (ref 39.0–52.0)
Hemoglobin: 13.5 g/dL (ref 13.0–17.0)
MCH: 28.8 pg (ref 26.0–34.0)
MCHC: 32.6 g/dL (ref 30.0–36.0)
MCV: 88.3 fL (ref 80.0–100.0)
Platelets: 150 10*3/uL (ref 150–400)
RBC: 4.69 MIL/uL (ref 4.22–5.81)
RDW: 14.4 % (ref 11.5–15.5)
WBC: 10 10*3/uL (ref 4.0–10.5)
nRBC: 0 % (ref 0.0–0.2)

## 2019-06-08 LAB — TROPONIN I (HIGH SENSITIVITY)
Troponin I (High Sensitivity): 3 ng/L (ref <18)
Troponin I (High Sensitivity): 3 ng/L (ref <18)

## 2019-06-08 NOTE — ED Provider Notes (Signed)
[facility] Emergency Department Provider Note __   First MD Initiated Contact with Patient 06/08/19 [phone]     (approximate)  I have reviewed the triage vital signs and the nursing notes.   HISTORY  Chief Complaint Chest Pain    HPI Eric [name] is a 73 y.o. male with below listed previous medical conditions including CHF, atrial fibrillation and dementia presents to the emergency department via EMS from the [facility] stating that he was told to come back to the emergency department.  Patient denies any complaints.  On my arrival to the room the patient states "can you get me a ride home".  Patient denies any chest pain no shortness of breath no abdominal pain.        Past Medical History:  Diagnosis Date  . Acute respiratory failure with hypoxia ([place])    multiple prior admissions to [facility] and [facility] for CHF exacerbation / respiratory failure  . Anxiety and depression   . Atrial fibrillation ([place]) 07/25/2018   new-onset during [place] admission in Jan 2020  . Cataract   . CHF (congestive heart failure) (HCC)    EF <= 50% as of Jan 2020  . Current use of long term anticoagulation 07/2018   Started on apixaban for new-onset a-fib in Jan 2020  . Dementia ([place])    possible, likely age-related, documented decreased cognitive function on prior hospitalizations  . Gallstones   . Glaucoma   . Hypertension   . Hypoglycemia   . Hypothyroidism   . Inguinal hernia   . Sleep apnea   . Thyroid disease     Patient Active Problem List   Diagnosis Date Noted  . Chronic combined systolic and diastolic CHF (congestive heart failure) ([place]) 11/16/2018  . Anasarca 11/16/2018  . Hypothyroidism 11/16/2018  . PAF (paroxysmal atrial fibrillation) ([place]) 11/16/2018  . HTN (hypertension) 11/16/2018  . Generalized anxiety disorder 11/03/2018  . Major depression in partial remission ([place]) 11/03/2018  . Acute hypoxemic respiratory failure ([place]) 10/29/2018  . Suspected  COVID-19 virus infection 10/29/2018  . Delirium     Past Surgical History:  Procedure Laterality Date  . FINGER AMPUTATION Right     Prior to Admission medications   Medication Sig Start Date End Date Taking? Authorizing Provider  acetaminophen (TYLENOL) 325 MG tablet Take 2 tablets (650 mg total) by mouth every 6 (six) hours as needed for mild pain (or Fever >/= 101). 11/03/18   Loletha Grayer, MD  albuterol (ACCUNEB) 0.63 MG/3ML nebulizer solution Take 1 ampule by nebulization every 6 (six) hours as needed for shortness of breath.     [provider]  ANORO ELLIPTA 62.5-25 MCG/INH AEPB Inhale 1 puff into the lungs every 6 (six) hours. 10/12/18   [provider]  bimatoprost (LUMIGAN) 0.01 % SOLN Place 1 drop into both eyes at bedtime.    [provider]  cyanocobalamin 500 MCG tablet Take 500 mcg by mouth 3 (three) times a week.    [provider]  diazepam (VALIUM) 10 MG tablet Take 1 tablet (10 mg total) by mouth every 6 (six) hours as needed for anxiety. 11/03/18   Loletha Grayer, MD  doxycycline (VIBRAMYCIN) 100 MG capsule Take 1 capsule (100 mg total) by mouth 2 (two) times daily. Patient not taking: Reported on 05/12/2019 01/18/19   Carrie Mew, MD  furosemide (LASIX) 20 MG tablet Take 1 tablet (20 mg total) by mouth daily. 11/04/18   Loletha Grayer, MD  levothyroxine (SYNTHROID) 150  MCG tablet Take 1 tablet (150 mcg total) by mouth daily. 11/03/18 11/03/19  Alford HighlandWieting, Richard, MD  liver oil-zinc oxide (DESITIN) 40 % ointment Apply topically daily. 11/04/18   Alford HighlandWieting, Richard, MD  Melatonin 3 MG TABS Take 1 tablet by mouth daily.    [provider]  metoprolol succinate (TOPROL-XL) 25 MG 24 hr tablet Take 1 tablet (25 mg total) by mouth daily. 11/04/18   Alford HighlandWieting, Richard, MD  OLANZapine zydis (ZYPREXA) 10 MG disintegrating tablet Take 1 tablet (10 mg total) by mouth 2 (two) times daily. 11/03/18   Alford HighlandWieting, Richard, MD  senna-docusate  (SENEXON-S) 8.6-50 MG tablet Take 2 tablets by mouth 2 (two) times daily.    [provider]  sertraline (ZOLOFT) 50 MG tablet Take 1 tablet (50 mg total) by mouth daily. 11/03/18   Alford HighlandWieting, Richard, MD  Vitamin D, Ergocalciferol, (DRISDOL) 1.25 MG (50000 UT) CAPS capsule Take 50,000 Units by mouth every 7 (seven) days.    [provider]    Allergies Demerol [meperidine], Epinephrine, Nardil [phenelzine], Morphine and related, Ondansetron, and Shellfish allergy  Family History  Problem Relation Age of Onset  . Alzheimer's disease Mother     Social History Social History   Tobacco Use  . Smoking status: Former Smoker    Quit date: 10/18/2010    Years since quitting: 8.6  . Smokeless tobacco: Never Used  Substance Use Topics  . Alcohol use: No  . Drug use: No    Review of Systems Constitutional: No fever/chills Eyes: No visual changes. ENT: No sore throat. Cardiovascular: Denies chest pain. Respiratory: Denies shortness of breath. Gastrointestinal: No abdominal pain.  No nausea, no vomiting.  No diarrhea.  No constipation. Genitourinary: Negative for dysuria. Musculoskeletal: Negative for neck pain.  Negative for back pain. Integumentary: Negative for rash. Neurological: Negative for headaches, focal weakness or numbness.   ____________________________________________   PHYSICAL EXAM:  VITAL SIGNS: ED Triage Vitals  Enc Vitals Group     BP 06/08/19 0035 137/80     Pulse Rate 06/08/19 0035 78     Resp 06/08/19 0035 20     Temp 06/08/19 0035 98.6 F (37 C)     Temp Source 06/08/19 0035 Oral     SpO2 06/08/19 0035 93 %     Weight 06/08/19 0036 90.7 kg (199 lb 15.3 oz)     Height --      Head Circumference --      Peak Flow --      Pain Score 06/08/19 0036 5     Pain Loc --      Pain Edu? --      Excl. in GC? --     Constitutional: Alert and oriented.  Eyes: Conjunctivae are normal.  Mouth/Throat: Patient is wearing a mask. Neck: No  stridor.  No meningeal signs.   Cardiovascular: Normal rate, regular rhythm. Good peripheral circulation. Grossly normal heart sounds. Respiratory: Normal respiratory effort.  No retractions. Gastrointestinal: Soft and nontender. No distention.  Musculoskeletal: Compressive dressings noted bilateral lower extremity Neurologic:  Normal speech and language. No gross focal neurologic deficits are appreciated.  Skin:  Skin is warm, dry and intact. Psychiatric: Mood and affect are normal. Speech and behavior are normal.  ____________________________________________   LABS (all labs ordered are listed, but only abnormal results are displayed)  Labs Reviewed  COMPREHENSIVE METABOLIC PANEL - Abnormal; Notable for the following components:      Result Value   Calcium 8.4 (*)    AST  11 (*)    All other components within normal limits  CBC  TROPONIN I (HIGH SENSITIVITY)  TROPONIN I (HIGH SENSITIVITY)   ____________________________________________  EKG  ED ECG REPORT I, [place] N Tawsha Terrero, the attending physician, personally viewed and interpreted this ECG.   Date: 06/08/2019  EKG Time: 12:45 AM  Rate: 76  Rhythm: Normal sinus rhythm  Axis: Normal  Intervals: Normal  ST&T Change: None    Procedures   ____________________________________________   INITIAL IMPRESSION / MDM / ASSESSMENT AND PLAN / ED COURSE  As part of my medical decision making, I reviewed the following data within the electronic MEDICAL RECORD NUMBER  73 year old male presented to the emergency department refusing to be evaluated.  Patient denies any complaints.  Patient states he only came back to the ER because he was told to do so.  ____________________________________________  FINAL CLINICAL IMPRESSION(S) / ED DIAGNOSES  Final diagnoses:  Dementia without behavioral disturbance, unspecified dementia type (HCC)     MEDICATIONS GIVEN DURING THIS VISIT:  Medications - No data to display   ED Discharge  Orders    None      *Please note:  Eric [name] was evaluated in Emergency Department on 06/08/2019 for the symptoms described in the history of present illness. He was evaluated in the context of the global COVID-19 pandemic, which necessitated consideration that the patient might be at risk for infection with the SARS-CoV-2 virus that causes COVID-19. Institutional protocols and algorithms that pertain to the evaluation of patients at risk for COVID-19 are in a state of rapid change based on information released by regulatory bodies including the CDC and federal and state organizations. These policies and algorithms were followed during the patient's care in the ED.  Some ED evaluations and interventions may be delayed as a result of limited staffing during the pandemic.*  Note:  This document was prepared using Dragon voice recognition software and may include unintentional dictation errors.   Darci Current, MD 06/08/19 [phone]
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
## 2019-06-08 NOTE — ED Notes (Signed)
Pt refusing treatment, refusing to stay in room for discharge. Pt taken to [facility] where he is requesting. [facility] called and will be coming to pick pt up between the time of 0800-0900. First RN made aware.
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
## 2019-06-08 NOTE — ED Notes (Signed)
Spoke to Ms. Brown at [facility] [phone]) to arrange transportation for the pt to return.  She stated the person who provides transportation will be not be in until between 8 and 9 a.m.

## 2019-06-08 NOTE — ED Notes (Signed)
Pt. Helped to make comfortable in bed.  Pt. Requested and was given meal tray and drink.  Pt. Continues to ask to go home.  Pt. Told he would be spending the night in the hospital.
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
## 2019-06-08 NOTE — ED Notes (Addendum)
Per EDP Williams, no lab testing or imaging is required at this time d/t pt receiving full work up twice in the last 24 hours.

## 2019-06-08 NOTE — ED Provider Notes (Signed)
[facility] Emergency Department Provider Note       Time seen: ----------------------------------------- 10:47 PM on 06/08/2019 ----------------------------------------- Level V caveat: History/ROS limited by dementia  I have reviewed the triage vital signs and the nursing notes.  HISTORY   Chief Complaint Altered Mental Status    HPI Eric [name] is a 73 y.o. male with a history of respiratory failure, atrial fibrillation, dementia, CHF, hypertension, hypothyroidism who presents to the ED for noncompliance.  The facility that he lives at which is the [place] state that they can no longer take care of him.  He is noncompliant with all his medications and with oxygen.  He denies any complaints, does not know why he is here.  Past Medical History:  Diagnosis Date  . Acute respiratory failure with hypoxia ([place])    multiple prior admissions to [facility] and [facility] for CHF exacerbation / respiratory failure  . Anxiety and depression   . Atrial fibrillation ([place]) 07/25/2018   new-onset during [facility] admission in Jan 2020  . Cataract   . CHF (congestive heart failure) ([facility])    EF <= 50% as of Jan 2020  . Current use of long term anticoagulation 07/2018   Started on apixaban for new-onset a-fib in Jan 2020  . Dementia ([place])    possible, likely age-related, documented decreased cognitive function on prior hospitalizations  . Gallstones   . Glaucoma   . Hypertension   . Hypoglycemia   . Hypothyroidism   . Inguinal hernia   . Sleep apnea   . Thyroid disease     Patient Active Problem List   Diagnosis Date Noted  . Chronic combined systolic and diastolic CHF (congestive heart failure) ([place]) 11/16/2018  . Anasarca 11/16/2018  . Hypothyroidism 11/16/2018  . PAF (paroxysmal atrial fibrillation) ([place]) 11/16/2018  . HTN (hypertension) 11/16/2018  . Generalized anxiety disorder 11/03/2018  . Major depression in partial remission ([place]) 11/03/2018  . Acute hypoxemic  respiratory failure ([place]) 10/29/2018  . Suspected COVID-19 virus infection 10/29/2018  . Delirium     Past Surgical History:  Procedure Laterality Date  . FINGER AMPUTATION Right     Allergies Demerol [meperidine], Epinephrine, Nardil [phenelzine], Morphine and related, Ondansetron, and Shellfish allergy  Social History Social History   Tobacco Use  . Smoking status: Former Smoker    Quit date: 10/18/2010    Years since quitting: 8.6  . Smokeless tobacco: Never Used  Substance Use Topics  . Alcohol use: No  . Drug use: No    Review of Systems Patient denies complaints All systems negative/normal/unremarkable except as stated in the HPI  ____________________________________________   PHYSICAL EXAM:  VITAL SIGNS: ED Triage Vitals  Enc Vitals Group     BP      Pulse      Resp      Temp      Temp src      SpO2      Weight      Height      Head Circumference      Peak Flow      Pain Score      Pain Loc      Pain Edu?      Excl. in [place]?     Constitutional: Alert but disoriented, no distress ENT      Head: Normocephalic and atraumatic.      Nose: No congestion/rhinnorhea.      Mouth/Throat: Mucous membranes are moist.      Neck:  No stridor. Cardiovascular: Normal rate, regular rhythm. No murmurs, rubs, or gallops. Respiratory: Normal respiratory effort without tachypnea nor retractions. Breath sounds are clear and equal bilaterally. No wheezes/rales/rhonchi. Gastrointestinal: Soft and nontender. Normal bowel sounds Musculoskeletal: Nontender with normal range of motion in extremities. No lower extremity tenderness nor edema. Neurologic:  Normal speech and language. No gross focal neurologic deficits are appreciated.  Skin:  Skin is warm, dry and intact. No rash noted. Psychiatric: Mood and affect are normal. Speech and behavior are normal.  ____________________________________________  ED COURSE:  As part of my medical decision making, I reviewed the  following data within the electronic MEDICAL RECORD NUMBER History obtained from family if available, nursing notes, old chart and ekg, as well as notes from prior ED visits. Patient presented for dementia with behavioral disturbance, patient already had lab work done today, does not need a recheck.  He has been seen 5 times in the last 2 weeks   Procedures  Eric [name] was evaluated in Emergency Department on 06/08/2019 for the symptoms described in the history of present illness. He was evaluated in the context of the global COVID-19 pandemic, which necessitated consideration that the patient might be at risk for infection with the SARS-CoV-2 virus that causes COVID-19. Institutional protocols and algorithms that pertain to the evaluation of patients at risk for COVID-19 are in a state of rapid change based on information released by regulatory bodies including the CDC and federal and state organizations. These policies and algorithms were followed during the patient's care in the ED.   ____________________________________________   DIFFERENTIAL DIAGNOSIS   Dementia, noncompliance, psychosis, occult infection  FINAL ASSESSMENT AND PLAN  Dementia with behavioral disturbance   Plan: The patient had presented for noncompliance from his facility.  Labs from earlier today are unremarkable.  Patient's vital signs are good, he appears medically clear for psychiatric evaluation and disposition.   Ulice Dash, MD    Note: This note was generated in part or whole with voice recognition software. Voice recognition is usually quite accurate but there are transcription errors that can and very often do occur. I apologize for any typographical errors that were not detected and corrected.     Emily Filbert, MD 06/08/19 2250

## 2019-06-09 DIAGNOSIS — F4325 Adjustment disorder with mixed disturbance of emotions and conduct: Secondary | ICD-10-CM | POA: Diagnosis not present

## 2019-06-09 LAB — TSH: TSH: 9.065 u[IU]/mL — ABNORMAL HIGH (ref 0.350–4.500)

## 2019-06-09 MED ORDER — SERTRALINE HCL 50 MG PO TABS
25.0000 mg | ORAL_TABLET | Freq: Every day | ORAL | Status: DC
  Filled 2019-06-09: qty 1

## 2019-06-09 MED ORDER — DIAZEPAM 5 MG PO TABS
10.0000 mg | ORAL_TABLET | Freq: Four times a day (QID) | ORAL | Status: DC | PRN
  Administered 2019-06-09: 12:00:00 10 mg via ORAL
  Filled 2019-06-09: qty 2

## 2019-06-09 MED ORDER — OLANZAPINE 5 MG PO TABS: 5.0000 mg | ORAL_TABLET | Freq: Once | ORAL | 0 refills | Status: DC | PRN

## 2019-06-09 MED ORDER — SERTRALINE HCL 50 MG PO TABS: 50.0000 mg | ORAL_TABLET | Freq: Every day | ORAL | Status: DC

## 2019-06-09 MED ORDER — OLANZAPINE 5 MG PO TBDP
10.0000 mg | ORAL_TABLET | Freq: Two times a day (BID) | ORAL | Status: DC
  Administered 2019-06-09: 12:00:00 10 mg via ORAL
  Filled 2019-06-09: qty 2

## 2019-06-09 MED ORDER — SERTRALINE HCL 25 MG PO TABS: 25.0000 mg | ORAL_TABLET | Freq: Every day | ORAL | 0 refills | Status: DC

## 2019-06-09 MED ORDER — FUROSEMIDE 40 MG PO TABS
20.0000 mg | ORAL_TABLET | Freq: Every day | ORAL | Status: DC
  Administered 2019-06-09: 20 mg via ORAL
  Filled 2019-06-09: qty 1

## 2019-06-09 MED ORDER — LEVOTHYROXINE SODIUM 50 MCG PO TABS
150.0000 ug | ORAL_TABLET | Freq: Every day | ORAL | Status: DC
  Filled 2019-06-09: qty 3

## 2019-06-09 MED ORDER — METOPROLOL SUCCINATE ER 50 MG PO TB24
25.0000 mg | ORAL_TABLET | Freq: Every day | ORAL | Status: DC
  Filled 2019-06-09: qty 1

## 2019-06-09 MED ORDER — OLANZAPINE 5 MG PO TABS: 5.0000 mg | ORAL_TABLET | Freq: Once | ORAL | Status: DC | PRN

## 2019-06-09 NOTE — ED Notes (Signed)
BEHAVIORAL HEALTH ROUNDING Patient sleeping: No. Patient alert and oriented: yes Behavior appropriate: Yes.  ; If no, describe:  Nutrition and fluids offered: yes Toileting and hygiene offered: Yes  Sitter present: q15 minute observations  Law enforcement present: Designer, television/film set

## 2019-06-09 NOTE — ED Notes (Signed)
He has been standing in the hallway - then in the doorway of his room  He insists on not wearing his oxygen  RA sats 92%   He is up and down  Stating  "I am ready to go - what time can I go?"  Pt informed that it is up to his compliance with his medicine and his oxygen when he returns to the [facility]

## 2019-06-09 NOTE — ED Notes (Signed)
Extension tubing placed on his nasal cannula so that he may move around the room  - continued encouragement to wear the oxygen required

## 2019-06-09 NOTE — ED Notes (Signed)
ED  Is the patient under IVC or is there intent for IVC:  voluntary Is the patient medically cleared: Yes.   Is there vacancy in the ED BHU: Yes.   Is the population mix appropriate for patient:  No he wears oxygen -  chronic  Is the patient awaiting placement in inpatient or outpatient setting: Yes.  Return to the [facility] Has the patient had a psychiatric consult: Yes.   Survey of unit performed for contraband, proper placement and condition of furniture, tampering with fixtures in bathroom, shower, and each patient room: Yes.  ; Findings:  APPEARANCE/BEHAVIOR  cooperative NEURO ASSESSMENT Orientation: oriented to self and place   Denies pain Hallucinations: No.None noted (Hallucinations) denies  Speech: Normal Gait: normal RESPIRATORY ASSESSMENT Even  Unlabored respirations  CARDIOVASCULAR ASSESSMENT Pulses equal   regular rate  Skin warm and dry   GASTROINTESTINAL ASSESSMENT no GI complaint EXTREMITIES Full ROM  PLAN OF CARE Provide calm/safe environment. Vital signs assessed twice daily. ED BHU Assessment once each 12-hour shift. Collaborate with TTS daily or as condition indicates. Assure the ED provider has rounded once each shift. Provide and encourage hygiene. Provide redirection as needed. Assess for escalating behavior; address immediately and inform ED provider.  Assess family dynamic and appropriateness for visitation as needed: Yes.  ; If necessary, describe findings:  Educate the patient/family about BHU procedures/visitation: Yes.  ; If necessary, describe findings:

## 2019-06-09 NOTE — ED Notes (Signed)
I spoke with a male at the [facility]  - she states that the nurse has left to go to [employer] and will be back shortly  - I informed her that the pt is discharged and ready to return  Questioned if he will need to return by EMS and she requested I call back to talk to the nurse  - I will call back shortly

## 2019-06-09 NOTE — ED Notes (Signed)
After multiple checks - I continue to await pharmacy to approve meds

## 2019-06-09 NOTE — Discharge Instructions (Signed)
Dementia Caregiver Guide Dementia is a term used to describe a number of symptoms that affect memory and thinking. The most common symptoms include:  Memory loss.  Trouble with language and communication.  Trouble concentrating.  Poor judgment.  Problems with reasoning.  Child-like behavior and language.  Extreme anxiety.  Angry outbursts.  Wandering from home or public places. Dementia usually gets worse slowly over time. In the early stages, people with dementia can stay independent and safe with some help. In later stages, they need help with daily tasks such as dressing, grooming, and using the bathroom. How to help the person with dementia cope Dementia can be frightening and confusing. Here are some tips to help the person with dementia cope with changes caused by the disease. General tips  Keep the person on track with his or her routine.  Try to identify areas where the person may need help.  Be supportive, patient, calm, and encouraging.  Gently remind the person that adjusting to changes takes time.  Help with the tasks that the person has asked for help with.  Keep the person involved in daily tasks and decisions as much as possible.  Encourage conversation, but try not to get frustrated or harried if the person struggles to find words or does not seem to appreciate your help. Communication tips  When the person is talking or seems frustrated, make eye contact and hold the person's hand.  Ask specific questions that need yes or no answers.  Use simple words, short sentences, and a calm voice. Only give one direction at a time.  When offering choices, limit them to just 1 or 2.  Avoid correcting the person in a negative way.  If the person is struggling to find the right words, gently try to help him or her. How to recognize symptoms of stress Symptoms of stress in caregivers include:  Feeling frustrated or angry with the person with  dementia.  Denying that the person has dementia or that his or her symptoms will not improve.  Feeling hopeless and unappreciated.  Difficulty sleeping.  Difficulty concentrating.  Feeling anxious, irritable, or depressed.  Developing stress-related health problems.  Feeling like you have too little time for your own life. Follow these instructions at home:   Make sure that you and the person you are caring for: ? Get regular sleep. ? Exercise regularly. ? Eat regular, nutritious meals. ? Drink enough fluid to keep your urine clear or pale yellow. ? Take over-the-counter and prescription medicines only as told by your health care providers. ? Attend all scheduled health care appointments.  Join a support group with others who are caregivers.  Ask about respite care resources so that you can have a regular break from the stress of caregiving.  Look for signs of stress in yourself and in the person you are caring for. If you notice signs of stress, take steps to manage it.  Consider any safety risks and take steps to avoid them.  Organize medications in a pill box for each day of the week.  Create a plan to handle any legal or financial matters. Get legal or financial advice if needed.  Keep a calendar in a central location to remind the person of appointments or other activities. Tips for reducing the risk of injury  Keep floors clear of clutter. Remove rugs, magazine racks, and floor lamps.  Keep hallways well lit, especially at night.  Put a handrail and nonslip mat in the bathtub or  shower.  Put childproof locks on cabinets that contain dangerous items, such as medicines, alcohol, guns, toxic cleaning items, sharp tools or utensils, matches, and lighters.  Put the locks in places where the person cannot see or reach them easily. This will help ensure that the person does not wander out of the house and get lost.  Be prepared for emergencies. Keep a list of  emergency phone numbers and addresses in a convenient area.  Remove car keys and lock garage doors so that the person does not try to get in the car and drive.  Have the person wear a bracelet that tracks locations and identifies the person as having memory problems. This should be worn at all times for safety. Where to find support: Many individuals and organizations offer support. These include:  Support groups for people with dementia and for caregivers.  Counselors or therapists.  Home health care services.  Adult day care centers. Where to find more information Alzheimer's Association: LimitLaws.hu Contact a health care provider if:  The person's health is rapidly getting worse.  You are no longer able to care for the person.  Caring for the person is affecting your physical and emotional health.  The person threatens himself or herself, you, or anyone else. Summary  Dementia is a term used to describe a number of symptoms that affect memory and thinking.  Dementia usually gets worse slowly over time.  Take steps to reduce the person's risk of injury, and to plan for future care.  Caregivers need support, relief from caregiving, and time for their own lives. This information is not intended to replace advice given to you by your health care provider. Make sure you discuss any questions you have with your health care provider. Document Released: 06/08/2016 Document Revised: 06/17/2017 Document Reviewed: 06/08/2016 Elsevier Patient Education  2020 Elsevier Inc.   Dementia Caregiver Guide Dementia is a term used to describe a number of symptoms that affect memory and thinking. The most common symptoms include:  Memory loss.  Trouble with language and communication.  Trouble concentrating.  Poor judgment.  Problems with reasoning.  Child-like behavior and language.  Extreme anxiety.  Angry outbursts.  Wandering from home or public places. Dementia usually  gets worse slowly over time. In the early stages, people with dementia can stay independent and safe with some help. In later stages, they need help with daily tasks such as dressing, grooming, and using the bathroom. How to help the person with dementia cope Dementia can be frightening and confusing. Here are some tips to help the person with dementia cope with changes caused by the disease. General tips  Keep the person on track with his or her routine.  Try to identify areas where the person may need help.  Be supportive, patient, calm, and encouraging.  Gently remind the person that adjusting to changes takes time.  Help with the tasks that the person has asked for help with.  Keep the person involved in daily tasks and decisions as much as possible.  Encourage conversation, but try not to get frustrated or harried if the person struggles to find words or does not seem to appreciate your help. Communication tips  When the person is talking or seems frustrated, make eye contact and hold the person's hand.  Ask specific questions that need yes or no answers.  Use simple words, short sentences, and a calm voice. Only give one direction at a time.  When offering choices, limit them to  just 1 or 2.  Avoid correcting the person in a negative way.  If the person is struggling to find the right words, gently try to help him or her. How to recognize symptoms of stress Symptoms of stress in caregivers include:  Feeling frustrated or angry with the person with dementia.  Denying that the person has dementia or that his or her symptoms will not improve.  Feeling hopeless and unappreciated.  Difficulty sleeping.  Difficulty concentrating.  Feeling anxious, irritable, or depressed.  Developing stress-related health problems.  Feeling like you have too little time for your own life. Follow these instructions at home:   Make sure that you and the person you are caring  for: ? Get regular sleep. ? Exercise regularly. ? Eat regular, nutritious meals. ? Drink enough fluid to keep your urine clear or pale yellow. ? Take over-the-counter and prescription medicines only as told by your health care providers. ? Attend all scheduled health care appointments.  Join a support group with others who are caregivers.  Ask about respite care resources so that you can have a regular break from the stress of caregiving.  Look for signs of stress in yourself and in the person you are caring for. If you notice signs of stress, take steps to manage it.  Consider any safety risks and take steps to avoid them.  Organize medications in a pill box for each day of the week.  Create a plan to handle any legal or financial matters. Get legal or financial advice if needed.  Keep a calendar in a central location to remind the person of appointments or other activities. Tips for reducing the risk of injury  Keep floors clear of clutter. Remove rugs, magazine racks, and floor lamps.  Keep hallways well lit, especially at night.  Put a handrail and nonslip mat in the bathtub or shower.  Put childproof locks on cabinets that contain dangerous items, such as medicines, alcohol, guns, toxic cleaning items, sharp tools or utensils, matches, and lighters.  Put the locks in places where the person cannot see or reach them easily. This will help ensure that the person does not wander out of the house and get lost.  Be prepared for emergencies. Keep a list of emergency phone numbers and addresses in a convenient area.  Remove car keys and lock garage doors so that the person does not try to get in the car and drive.  Have the person wear a bracelet that tracks locations and identifies the person as having memory problems. This should be worn at all times for safety. Where to find support: Many individuals and organizations offer support. These include:  Support groups for people  with dementia and for caregivers.  Counselors or therapists.  Home health care services.  Adult day care centers. Where to find more information Alzheimer's Association: LimitLaws.huwww.alz.org Contact a health care provider if:  The person's health is rapidly getting worse.  You are no longer able to care for the person.  Caring for the person is affecting your physical and emotional health.  The person threatens himself or herself, you, or anyone else. Summary  Dementia is a term used to describe a number of symptoms that affect memory and thinking.  Dementia usually gets worse slowly over time.  Take steps to reduce the person's risk of injury, and to plan for future care.  Caregivers need support, relief from caregiving, and time for their own lives. This information is not intended to  replace advice given to you by your health care provider. Make sure you discuss any questions you have with your health care provider. Document Released: 06/08/2016 Document Revised: 06/17/2017 Document Reviewed: 06/08/2016 Elsevier Patient Education  2020 [employer].

## 2019-06-09 NOTE — ED Notes (Signed)
BEHAVIORAL HEALTH ROUNDING Patient sleeping: No. Patient alert and oriented: yes Behavior appropriate: Yes - loud outbursts - "I am ready to go"  .  ; If no, describe:  Nutrition and fluids offered: yes Toileting and hygiene offered: Yes  Sitter present: q15 minute observations and security camera monitoring Law enforcement present: Yes  [employer]  ENVIRONMENTAL ASSESSMENT Potentially harmful objects out of patient reach: Yes.   Personal belongings secured: Yes.   Patient dressed in hospital provided attire only: Yes.   Plastic bags out of patient reach: Yes.   Patient care equipment (cords, cables, call bells, lines, and drains) shortened, removed, or accounted for: Yes.   Equipment and supplies removed from bottom of stretcher: Yes.   Potentially toxic materials out of patient reach: Yes.   Sharps container removed or out of patient reach: Yes.

## 2019-06-09 NOTE — ED Notes (Signed)
I called back to the [place] - no one answered the phone

## 2019-06-09 NOTE — Consult Note (Addendum)
[facility] Psych ED Discharge  06/09/2019 10:51 AM Eric [name]  MRN:  [id number] Principal Problem: Adjustment disorder with mixed disturbance of emotions and conduct Discharge Diagnoses: Principal Problem:   Adjustment disorder with mixed disturbance of emotions and conduct Active Problems:   Generalized anxiety disorder   Major depression in partial remission (HCC)  Subjective: "I will to go back (to the [facility])".  Patient seen and evaluated in person by this provider.  He is anxious as he wants to leave and return to his facility.  It was reported that he had pulled the fire alarm because he wanted to leave.  He reports that he was falling and caught himself on the fire alarm handle with no intent to disturb anyone.  Denies suicidal/homicidal ideations, hallucinations, and substance abuse.  Facility reports some agitation and additional Zyprexa ordered as needed agitation.  Patient is stable at this time to return to his facility.  HPI per MD:  Eric [name] is a 73 y.o. male with a history of respiratory failure, atrial fibrillation, dementia, CHF, hypertension, hypothyroidism who presents to the ED for noncompliance.  The facility that he lives at which is the [place] state that they can no longer take care of him.  He is noncompliant with all his medications and with oxygen.  He denies any complaints, does not know why he is here.  Total Time spent with patient: 1 hour  Past Psychiatric History: depression and anxiety  Past Medical History:  Past Medical History:  Diagnosis Date  . Acute respiratory failure with hypoxia (HCC)    multiple prior admissions to [facility] and [facility] for CHF exacerbation / respiratory failure  . Anxiety and depression   . Atrial fibrillation (HCC) 07/25/2018   new-onset during [facility] admission in Jan 2020  . Cataract   . CHF (congestive heart failure) (HCC)    EF <= 50% as of Jan 2020  . Current use of long term anticoagulation 07/2018   Started on apixaban for  new-onset a-fib in Jan 2020  . Dementia (HCC)    possible, likely age-related, documented decreased cognitive function on prior hospitalizations  . Gallstones   . Glaucoma   . Hypertension   . Hypoglycemia   . Hypothyroidism   . Inguinal hernia   . Sleep apnea   . Thyroid disease     Past Surgical History:  Procedure Laterality Date  . FINGER AMPUTATION Right    Family History:  Family History  Problem Relation Age of Onset  . Alzheimer's disease Mother    Family Psychiatric  History: see above  Social History:  Social History   Substance and Sexual Activity  Alcohol Use No     Social History   Substance and Sexual Activity  Drug Use No    Social History   Socioeconomic History  . Marital status: Divorced    Spouse name: Not on file  . Number of children: 3  . Years of education: Not on file  . Highest education level: Not on file  Occupational History  . Not on file  Social Needs  . Financial resource strain: Not on file  . Food insecurity    Worry: Not on file    Inability: Not on file  . Transportation needs    Medical: Not on file    Non-medical: Not on file  Tobacco Use  . Smoking status: Former Smoker    Quit date: 10/18/2010    Years since quitting: 8.6  . Smokeless tobacco:  Never Used  Substance and Sexual Activity  . Alcohol use: No  . Drug use: No  . Sexual activity: Not on file  Lifestyle  . Physical activity    Days per week: Not on file    Minutes per session: Not on file  . Stress: Not on file  Relationships  . Social Musicianconnections    Talks on phone: Not on file    Gets together: Not on file    Attends religious service: Not on file    Active member of club or organization: Not on file    Attends meetings of clubs or organizations: Not on file    Relationship status: Not on file  Other Topics Concern  . Not on file  Social History Narrative  . Not on file    Has this patient used any form of tobacco in the last 30 days?  (Cigarettes, Smokeless Tobacco, Cigars, and/or Pipes) N/A  Current Medications: Current Facility-Administered Medications  Medication Dose Route Frequency Provider Last Rate Last Dose  . diazepam (VALIUM) tablet 10 mg  10 mg Oral Q6H PRN Charm RingsLord, Jamison Y, NP      . furosemide (LASIX) tablet 20 mg  20 mg Oral Daily Charm RingsLord, Jamison Y, NP      . levothyroxine (SYNTHROID) tablet 150 mcg  150 mcg Oral Daily Charm RingsLord, Jamison Y, NP      . metoprolol succinate (TOPROL-XL) 24 hr tablet 25 mg  25 mg Oral Daily Lord, Jamison Y, NP      . OLANZapine (ZYPREXA) tablet 5 mg  5 mg Oral Once PRN Charm RingsLord, Jamison Y, NP      . OLANZapine zydis (ZYPREXA) disintegrating tablet 10 mg  10 mg Oral BID Charm RingsLord, Jamison Y, NP      . sertraline (ZOLOFT) tablet 25 mg  25 mg Oral Daily Charm RingsLord, Jamison Y, NP       Current Outpatient Medications  Medication Sig Dispense Refill  . acetaminophen (TYLENOL) 325 MG tablet Take 2 tablets (650 mg total) by mouth every 6 (six) hours as needed for mild pain (or Fever >/= 101).    Marland Kitchen. albuterol (ACCUNEB) 0.63 MG/3ML nebulizer solution Take 1 ampule by nebulization every 6 (six) hours as needed for shortness of breath.     Ailene Ards. ANORO ELLIPTA 62.5-25 MCG/INH AEPB Inhale 1 puff into the lungs every 6 (six) hours.    . bimatoprost (LUMIGAN) 0.01 % SOLN Place 1 drop into both eyes at bedtime.    . cyanocobalamin 500 MCG tablet Take 500 mcg by mouth 3 (three) times a week.    . diazepam (VALIUM) 10 MG tablet Take 1 tablet (10 mg total) by mouth every 6 (six) hours as needed for anxiety. 10 tablet 0  . furosemide (LASIX) 20 MG tablet Take 1 tablet (20 mg total) by mouth daily. 30 tablet 0  . levothyroxine (SYNTHROID) 150 MCG tablet Take 1 tablet (150 mcg total) by mouth daily. 30 tablet 11  . liver oil-zinc oxide (DESITIN) 40 % ointment Apply topically daily. 56.7 g 0  . Melatonin 3 MG TABS Take 1 tablet by mouth daily.    . metoprolol succinate (TOPROL-XL) 25 MG 24 hr tablet Take 1 tablet (25 mg total) by  mouth daily. 30 tablet 0  . OLANZapine zydis (ZYPREXA) 10 MG disintegrating tablet Take 1 tablet (10 mg total) by mouth 2 (two) times daily. 60 tablet 0  . senna-docusate (SENEXON-S) 8.6-50 MG tablet Take 2 tablets by mouth 2 (two) times daily.    .Marland Kitchen  sertraline (ZOLOFT) 50 MG tablet Take 1 tablet (50 mg total) by mouth daily. 30 tablet 0  . Vitamin D, Ergocalciferol, (DRISDOL) 1.25 MG (50000 UT) CAPS capsule Take 50,000 Units by mouth every 7 (seven) days.     PTA Medications: (Not in a hospital admission)   Musculoskeletal: Strength & Muscle Tone: within normal limits Gait & Station: normal Patient leans: N/A  Psychiatric Specialty Exam: Physical Exam  Nursing note and vitals reviewed. Constitutional: He is oriented to person, place, and time. He appears well-developed and well-nourished.  HENT:  Head: Normocephalic.  Neck: Normal range of motion.  Musculoskeletal: Normal range of motion.  Neurological: He is alert and oriented to person, place, and time.    ROS  Blood pressure 130/84, pulse 84, temperature 98 F (36.7 C), temperature source Oral, resp. rate 17, height 5\' 7"  (1.702 m), weight 90.7 kg, SpO2 98 %.Body mass index is 31.32 kg/m.  General Appearance: Casual  Eye Contact:  Good  Speech:  Normal Rate  Volume:  Normal  Mood:  Anxious  Affect:  Congruent  Thought Process:  Coherent and Descriptions of Associations: Intact  Orientation:  Full (Time, Place, and Person)  Thought Content:  Logical  Suicidal Thoughts:  No  Homicidal Thoughts:  No  Memory:  Immediate;   Fair Recent;   Fair Remote;   Fair  Judgement:  Fair  Insight:  Fair  Psychomotor Activity:  Normal  Concentration:  Concentration: Fair and Attention Span: Fair  Recall:  [employer] of Knowledge:  Fair  Language:  Fair  Akathisia:  No  Handed:  Right  AIMS (if indicated):     Assets:  Housing Leisure Time Resilience Social Support  ADL's:  Intact  Cognition:  WNL  Sleep:         Demographic Factors:  Male, Age 73 or older and Caucasian  Loss Factors: NA  Historical Factors: Impulsivity  Risk Reduction Factors:   Sense of responsibility to family, Living with another person, especially a relative, Positive social support and Positive therapeutic relationship  Continued Clinical Symptoms:  Anxiety, mild  Cognitive Features That Contribute To Risk:  None    Suicide Risk:  Minimal: No identifiable suicidal ideation.  Patients presenting with no risk factors but with morbid ruminations; may be classified as minimal risk based on the severity of the depressive symptoms   Plan Of Care/Follow-up recommendations:  Adjustment disorder with mixed disturbance of emotions and conduct: -Continue Zyprexa 10 mg twice daily with the addition of Zyprexa 5 mg daily as needed for agitation  Anxiety: -Continue Valium 10 mg 3 times daily as needed, recommend tapering off of this in the outpatient environment  Major depressive disorder: -Decrease Zoloft 50 mg to 25 mg daily Activity:  as tolerated Diet:  heart healthy diet  Disposition: discharge back to his facility Waylan Boga, NP 06/09/2019, 10:51 AM

## 2019-06-09 NOTE — ED Notes (Signed)
BEHAVIORAL HEALTH ROUNDING Patient sleeping: No. Patient alert and oriented: yes Behavior appropriate: Yes.  ; If no, describe:  Nutrition and fluids offered: yes Toileting and hygiene offered: Yes  Sitter present: q15 minute observations  Law enforcement present: Yes [employer]  

## 2019-06-09 NOTE — ED Notes (Signed)
Pt observed sitting on the side of the bed  - his clothing was left in his room and he has taken out his coat and put it on  -  3 pens can be observed from his scrub pocket  - I asked if he would take his coat off for me and he stated  "No *- this is my coat" asked him if he had any weapons in his coat and he grabbed the pocket and stated "No"  He does not have his oxygen on  - I took his VS and when he took his arm out of the coat I managed to get it and place it in a labeled bag for him (nail clippers were in the coat) - he has 2/2 bags labeled and placed in storage   Attempted to orient him and to tell him the plan of care  -  Pt states  "I am ready to go home - can I just walk back to the [facility]"  Pt encouraged to put his oxygen back on and educated about its importance    Psych consult pending Social work consult pending

## 2019-06-09 NOTE — ED Notes (Signed)
TTS has called [facility] and no one has answered the phone there today  - he is ready for discharge

## 2019-06-09 NOTE — ED Notes (Signed)
Spoke with Cristie Hem to approve meds

## 2019-06-09 NOTE — ED Notes (Signed)
BEHAVIORAL HEALTH ROUNDING Patient sleeping: No. Patient alert and oriented: yes Behavior appropriate: Yes.  ; If no, describe:  Nutrition and fluids offered: yes Toileting and hygiene offered: Yes  Sitter present: q15 minute observations  Law enforcement present: Yes  [employer] [place]

## 2019-06-20 ENCOUNTER — Emergency Department: Payer: Medicare Other

## 2019-06-20 ENCOUNTER — Other Ambulatory Visit: Payer: Self-pay

## 2019-06-20 ENCOUNTER — Emergency Department
Admission: EM | Admit: 2019-06-20 | Discharge: 2019-06-20 | Discharge status: Against Medical Advice | Payer: Medicare Other | Attending: Emergency Medicine | Admitting: Emergency Medicine

## 2019-06-20 DIAGNOSIS — R600 Localized edema: Secondary | ICD-10-CM | POA: Diagnosis not present

## 2019-06-20 DIAGNOSIS — F0390 Unspecified dementia without behavioral disturbance: Secondary | ICD-10-CM | POA: Diagnosis not present

## 2019-06-20 DIAGNOSIS — R0602 Shortness of breath: Secondary | ICD-10-CM | POA: Diagnosis present

## 2019-06-20 DIAGNOSIS — R609 Edema, unspecified: Secondary | ICD-10-CM

## 2019-06-20 DIAGNOSIS — E039 Hypothyroidism, unspecified: Secondary | ICD-10-CM | POA: Diagnosis not present

## 2019-06-20 DIAGNOSIS — Z888 Allergy status to other drugs, medicaments and biological substances status: Secondary | ICD-10-CM | POA: Diagnosis not present

## 2019-06-20 DIAGNOSIS — I110 Hypertensive heart disease with heart failure: Secondary | ICD-10-CM | POA: Insufficient documentation

## 2019-06-20 DIAGNOSIS — I5042 Chronic combined systolic (congestive) and diastolic (congestive) heart failure: Secondary | ICD-10-CM | POA: Insufficient documentation

## 2019-06-20 DIAGNOSIS — Z79899 Other long term (current) drug therapy: Secondary | ICD-10-CM | POA: Diagnosis not present

## 2019-06-20 DIAGNOSIS — Z885 Allergy status to narcotic agent status: Secondary | ICD-10-CM | POA: Insufficient documentation

## 2019-06-20 DIAGNOSIS — N5089 Other specified disorders of the male genital organs: Secondary | ICD-10-CM | POA: Diagnosis not present

## 2019-06-20 DIAGNOSIS — I480 Paroxysmal atrial fibrillation: Secondary | ICD-10-CM | POA: Diagnosis not present

## 2019-06-20 DIAGNOSIS — Z91013 Allergy to seafood: Secondary | ICD-10-CM | POA: Insufficient documentation

## 2019-06-20 DIAGNOSIS — Z87891 Personal history of nicotine dependence: Secondary | ICD-10-CM | POA: Diagnosis not present

## 2019-06-20 LAB — CBC
HCT: 41.7 % (ref 39.0–52.0)
Hemoglobin: 13.5 g/dL (ref 13.0–17.0)
MCH: 29.6 pg (ref 26.0–34.0)
MCHC: 32.4 g/dL (ref 30.0–36.0)
MCV: 91.4 fL (ref 80.0–100.0)
Platelets: 170 10*3/uL (ref 150–400)
RBC: 4.56 MIL/uL (ref 4.22–5.81)
RDW: 14.2 % (ref 11.5–15.5)
WBC: 9 10*3/uL (ref 4.0–10.5)
nRBC: 0 % (ref 0.0–0.2)

## 2019-06-20 LAB — URINALYSIS, COMPLETE (UACMP) WITH MICROSCOPIC
Bacteria, UA: NONE SEEN
Bilirubin Urine: NEGATIVE
Glucose, UA: NEGATIVE mg/dL
Ketones, ur: NEGATIVE mg/dL
Leukocytes,Ua: NEGATIVE
Nitrite: NEGATIVE
Protein, ur: NEGATIVE mg/dL
Specific Gravity, Urine: 1.02 (ref 1.005–1.030)
pH: 6.5 (ref 5.0–8.0)

## 2019-06-20 LAB — BASIC METABOLIC PANEL
Anion gap: 6 (ref 5–15)
BUN: 16 mg/dL (ref 8–23)
CO2: 30 mmol/L (ref 22–32)
Calcium: 8.8 mg/dL — ABNORMAL LOW (ref 8.9–10.3)
Chloride: 101 mmol/L (ref 98–111)
Creatinine, Ser: 0.87 mg/dL (ref 0.61–1.24)
GFR calc Af Amer: >60 mL/min (ref >60)
GFR calc non Af Amer: >60 mL/min (ref >60)
Glucose, Bld: 104 mg/dL — ABNORMAL HIGH (ref 70–99)
Potassium: 4.3 mmol/L (ref 3.5–5.1)
Sodium: 137 mmol/L (ref 135–145)

## 2019-06-20 LAB — BRAIN NATRIURETIC PEPTIDE: B Natriuretic Peptide: 21 pg/mL (ref 0.0–100.0)

## 2019-06-20 LAB — TROPONIN I (HIGH SENSITIVITY): Troponin I (High Sensitivity): 4 ng/L (ref <18)

## 2019-06-20 MED ORDER — AMOXICILLIN-POT CLAVULANATE 875-125 MG PO TABS: 1.0000 | ORAL_TABLET | Freq: Two times a day (BID) | ORAL | 0 refills | Status: AC

## 2019-06-20 MED ORDER — AMOXICILLIN-POT CLAVULANATE 875-125 MG PO TABS: 1.0000 | ORAL_TABLET | Freq: Once | ORAL | Status: DC

## 2019-06-20 MED ORDER — SODIUM CHLORIDE 0.9% FLUSH: 3.0000 mL | Freq: Once | INTRAVENOUS | Status: DC

## 2019-06-20 MED ORDER — FUROSEMIDE 40 MG PO TABS
40.0000 mg | ORAL_TABLET | Freq: Once | ORAL | Status: AC
  Administered 2019-06-20: 40 mg via ORAL
  Filled 2019-06-20: qty 1

## 2019-06-20 MED ORDER — FUROSEMIDE 10 MG/ML IJ SOLN: 40.0000 mg | Freq: Once | INTRAMUSCULAR | Status: DC

## 2019-06-20 NOTE — Discharge Instructions (Addendum)
Please take the Augmentin 1 pill twice a day.  Please examine the penis and scrotum carefully to make sure they are improving every day.  Return for fever, vomiting, pain in the genital area or any increasing redness or darkness of the skin.  Also return for any worsening of the patient's condition in any other way including mental status or trouble breathing.  Please make sure he takes his Lasix every day.

## 2019-06-20 NOTE — ED Notes (Signed)
Upon entering room found Amy, RN, [place], [place], and [place], NT with pt in bed attempting to put on oxygen. Waunita Schooner states that pt slid out of chair beside the bed onto his knees and caught himself with both his hands. Pt was found with labored breathing and put on 2L of oxygen. Pt states "I want to go home, you cant keep me here". Explained to him that as soon as we get in contact with his legal guardian then we can discharge him back to [facility]. Pt states "I dont have a legal guardian, no one tells me what to do. I can leave if I want to". Reiterated that we are waiting on DSS to call us back. This nurse previously spoke with pt and explained the importance of stay in the bed and not getting out because he may fall.

## 2019-06-20 NOTE — ED Notes (Signed)
Pt refusing XR. Pt is more concerned about eating. Pt stated to XR tech "I do not need an xray, I just had one. I do not want all that stuff inside me. I am hungry and just want to eat." Pt is in lobby in wheelchair. Sam,RN made aware.

## 2019-06-20 NOTE — ED Notes (Addendum)
This nurse spoke with on-call social worker and updated her on pt refusal of treatment. States that she will call her supervisor who is more familiar with the pt and will get permission for pt to leave [facility] ED.

## 2019-06-20 NOTE — ED Notes (Signed)
EMS here to transport pt. 

## 2019-06-20 NOTE — ED Notes (Signed)
Patient alert and oriented.  md in and gave recommends medication to take off fluid.  Patient says he does not want that.

## 2019-06-20 NOTE — ED Notes (Signed)
Amy, RN, Mickel Baas, RN, and Eva, NT at bedside explaining to pt that he can't leave at this time and assisting pt with moving from bed into wheelchair with bed alarm under pt.

## 2019-06-20 NOTE — ED Notes (Signed)
This nurse walked into pt room and found him standing in the middle of the floor with gown off attempting to leave the ED. Told the pt that he needed to stay in the ED until we made contact with his legal guardian and informed them that pt is refusing treatment. States that he thinks we are kidnapping him and he wants to go back home while attempting to walk to door. Restated that he is not allowed to leave until we can contact legal guardian. This nurse assisted pt with ambulating to bathroom, and helped dress the pt back into his gown. Ambulated pt back to bed and informed pt that he needed to stay in the bed, pt agreed and understood.

## 2019-06-20 NOTE — ED Notes (Signed)
Attempted to give pt lasix tablet, states he is not going to take pill because he doesn't believe in taking drugs. This nurse explained to him that he needs the medication to get the fluid off of his ankles and scrotum, pt still refused medication. MD made aware.

## 2019-06-20 NOTE — ED Triage Notes (Signed)
Pt comes via [facility] from [facility] with c/o groin swelling. Pt also states SOB. Pt states this has been going on for awhile.  Pt states pain in groin and scotrum area. Pt states it is swollen and it is limiting his walking ability. Pt denies any discharge, but states bleeding at the folds.  Pt denies any CP. Pt wears 2L of O2, EMS placed pt on 3L due to O2 being at 93%.  Pt states this swelling is been about a week. Pt states the SOB has been going on for awhile.

## 2019-06-20 NOTE — ED Notes (Addendum)
This nurse attempted to call pt legal guardian Eric [name] and update her on pt status and refusal of treatment, and was directed to [facility] office to speak with on-call social worker. Sheriff's office states they will have the on-call worker call us back about pt.

## 2019-06-20 NOTE — ED Provider Notes (Signed)
[facility] Emergency Department Provider Note ____________________________________________   First MD Initiated Contact with Patient 06/20/19 1504     (approximate)  I have reviewed the triage vital signs and the nursing notes.   HISTORY  Chief Complaint Shortness of Breath and Groin Swelling  Level 5 caveat: History of present illness limited by uncooperative behavior  HPI Eric [name] is a 73 y.o. male with PMH as noted below who presents with scrotal and penile swelling which he states has been worse for about 1 week, and associated with some increased shortness of breath.  He denies fever or chills.  He states that he has bilateral lower leg swelling but this has been present for some time.  He states he is not on any water pill.  Past Medical History:  Diagnosis Date   Acute respiratory failure with hypoxia (HCC)    multiple prior admissions to [facility] and [facility] for CHF exacerbation / respiratory failure   Anxiety and depression    Atrial fibrillation (HCC) 07/25/2018   new-onset during [facility] admission in Jan 2020   Cataract    CHF (congestive heart failure) (HCC)    EF <= 50% as of Jan 2020   Current use of long term anticoagulation 07/2018   Started on apixaban for new-onset a-fib in Jan 2020   Dementia [facility])    possible, likely age-related, documented decreased cognitive function on prior hospitalizations   Gallstones    Glaucoma    Hypertension    Hypoglycemia    Hypothyroidism    Inguinal hernia    Sleep apnea    Thyroid disease     Patient Active Problem List   Diagnosis Date Noted   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 Authorizing Provider  acetaminophen (TYLENOL) 325 MG tablet Take 2 tablets (650 mg total) by mouth every 6 (six) hours as needed for mild pain (or Fever >/= 101). 11/03/18   Alford Highland, MD  albuterol (ACCUNEB) 0.63 MG/3ML nebulizer solution Take 1 ampule by nebulization every 6 (six) hours as needed for shortness of breath.     [provider]  ANORO ELLIPTA 62.5-25 MCG/INH AEPB Inhale 1 puff into the lungs every 6 (six) hours. 10/12/18   [provider]  bimatoprost (LUMIGAN) 0.01 % SOLN Place 1 drop into both eyes at bedtime.    [provider]  cyanocobalamin 500 MCG tablet Take 500 mcg by mouth 3 (three) times a week.    [provider]  diazepam (VALIUM) 10 MG tablet Take 1 tablet (10 mg total) by mouth every 6 (six) hours as needed for anxiety. 11/03/18   Alford Highland, MD  furosemide (LASIX) 20 MG tablet Take 1 tablet (20 mg total) by mouth daily. 11/04/18   Alford Highland, MD  levothyroxine (SYNTHROID) 150 MCG tablet Take 1 tablet (150 mcg total) by mouth daily. 11/03/18 11/03/19  Alford Highland, MD  liver  oil-zinc oxide (DESITIN) 40 % ointment Apply topically daily. 11/04/18   Loletha Grayer, MD  Melatonin 3 MG TABS Take 1 tablet by mouth daily.    [provider]  metoprolol succinate (TOPROL-XL) 25 MG 24 hr tablet Take 1 tablet (25 mg total) by mouth daily. 11/04/18   Loletha Grayer, MD  OLANZapine (ZYPREXA) 5 MG tablet Take 1 tablet (5 mg total) by mouth once as needed (agitation). 06/09/19   Patrecia Pour, NP  OLANZapine zydis (ZYPREXA) 10 MG disintegrating tablet Take 1 tablet (10 mg  total) by mouth 2 (two) times daily. 11/03/18   Loletha Grayer, MD  senna-docusate (SENEXON-S) 8.6-50 MG tablet Take 2 tablets by mouth 2 (two) times daily.    [provider]  sertraline (ZOLOFT) 25 MG tablet Take 1 tablet (25 mg total) by mouth daily. 06/10/19   Patrecia Pour, NP  Vitamin D, Ergocalciferol, (DRISDOL) 1.25 MG (50000 UT) CAPS capsule Take 50,000 Units by mouth every 7 (seven) days.    [provider]    Allergies Demerol [meperidine], Epinephrine, Nardil [phenelzine], Morphine and related, Ondansetron, and Shellfish allergy  Family History  Problem Relation Age of Onset   Alzheimer's disease Mother     Social History Social History   Tobacco Use   Smoking status: Former Smoker    Quit date: 10/18/2010    Years since quitting: 8.6   Smokeless tobacco: Never Used  Substance Use Topics   Alcohol use: No   Drug use: No    Review of Systems Level 5 caveat: Review of systems limited due to uncooperative behavior Constitutional: No fever/chills Cardiovascular: Denies chest pain. Respiratory: Positive for shortness of breath. Gastrointestinal: No vomiting or diarrhea.  Skin: Negative for rash. Neurological: Negative for headache.   ____________________________________________   PHYSICAL EXAM:  VITAL SIGNS: ED Triage Vitals [06/20/19 1147]  Enc Vitals Group     BP (!) 143/88     Pulse Rate 71     Resp 18     Temp 99.2 F (37.3 C)     Temp src      SpO2 99 %     Weight 199 lb 15.3 oz (90.7 kg)     Height 5\' 7"  (1.702 m)     Head Circumference      Peak Flow      Pain Score 10     Pain Loc      Pain Edu?      Excl. in [place]?     Constitutional: Alert and oriented. Well appearing and in no acute distress. Eyes: Conjunctivae are normal.  Head: Atraumatic. Nose: No congestion/rhinnorhea. Mouth/Throat: Mucous membranes are moist.   Neck: Normal range of motion.  Cardiovascular: Normal rate, regular rhythm. Good peripheral  circulation. Respiratory: Increased respiratory effort.  No retractions. Gastrointestinal: Soft and nontender. No distention.  Genitourinary: Scrotal and penile swelling consistent with edema.  No tenderness.  No erythema or induration.  No discharge.  No crepitus. Musculoskeletal: 2+ bilateral lower extremity edema and chronic venous stasis changes.  Extremities warm and well perfused.  No erythema, induration, open wounds, or drainage. Neurologic:  Normal speech and language. No gross focal neurologic deficits are appreciated.  Skin:  Skin is warm and dry. No rash noted. Psychiatric: Uncooperative and labile.  ____________________________________________   LABS (all labs ordered are listed, but only abnormal results are displayed)  Labs Reviewed  BASIC METABOLIC PANEL - Abnormal; Notable for the following components:      Result Value  Glucose, Bld 104 (*)    Calcium 8.8 (*)    All other components within normal limits  URINALYSIS, COMPLETE (UACMP) WITH MICROSCOPIC - Abnormal; Notable for the following components:   Hgb urine dipstick TRACE (*)    All other components within normal limits  CBC  BRAIN NATRIURETIC PEPTIDE  TROPONIN I (HIGH SENSITIVITY)  TROPONIN I (HIGH SENSITIVITY)   ____________________________________________  EKG   ____________________________________________  RADIOLOGY    ____________________________________________   PROCEDURES  Procedure(s) performed: No  Procedures  Critical Care performed: No ____________________________________________   INITIAL IMPRESSION / ASSESSMENT AND PLAN / ED COURSE  Pertinent labs & imaging results that were available during my care of the patient were reviewed by me and considered in my medical decision making (see chart for details).  73 year old male with PMH as noted above including CHF presents with scrotal and penile swelling over approximately the last week and increased shortness of breath.  I  reviewed the past medical records in Epic; the patient has numerous prior ED visits here including 4 visits last month.  On multiple prior visits he presents with a complaint and then when seen by the MD he states that he no longer wants to be evaluated and demands to be discharged.  Most recently he was seen on 11/20 due to chronic noncompliance with medications and treatments and was cleared by psychiatry.  On evaluation today, the patient is uncooperative and appears angry.  He demanded to be seen by the physician immediately when placed in the room, although then appeared upset with me when I helped him undress and get into the stretcher for a proper evaluation.  When I spoke in a normal voice, the patient had difficulty hearing me but when I raise my voice in order to be more audible to him, he accused me of hollering at him.  After I discussed my evaluation and recommendations with him, he immediately wanted to leave.  On exam, the patient is alert and oriented x4.  O2 saturation was 90% on room air and his other vital signs are normal.  He has increased work of breathing especially with exertion although states that his breathing is like this all the time.  GU exam reveals scrotal and penile swelling which is consistent with dependent edema.  I suspect that this is from his CHF.  There is no clinical evidence for cellulitis, abscess, or deep tissue infection.  The patient has no urinary obstruction with the edema.  I recommended to the patient that we obtain a chest x-ray to evaluate for pulmonary edema, scrotal ultrasound, give an IV diuretic, and reassess.  He declined all of these and demanded to be discharged.  Although he was somewhat uncooperative, I was able to have a full discussion with the patient about my concerns for pulmonary edema, hypoxia, and the fact that without further intervention I am concerned for respiratory failure and possible death, as well as the development of cellulitis or  life-threatening infection with the scrotal edema.  The patient is oriented x4, not currently hypoxic, and understood all these risks and was able to paraphrase them back to me.  He demonstrates appropriate decision-making capacity to decline care and leave.  With the RN I had an extensive discussion with him about treatment options and we made sure that he understands he may return at any time if he feels worse or simply changes his mind and wishes to resume care.  I have signed the patient out AGAINST MEDICAL ADVICE  although he declined to sign paperwork.  __________________________  Kary Kosaniel Lape [name] was evaluated in Emergency Department on 06/20/2019 for the symptoms described in the history of present illness. He was evaluated in the context of the global COVID-19 pandemic, which necessitated consideration that the patient might be at risk for infection with the SARS-CoV-2 virus that causes COVID-19. Institutional protocols and algorithms that pertain to the evaluation of patients at risk for COVID-19 are in a state of rapid change based on information released by regulatory bodies including the CDC and federal and state organizations. These policies and algorithms were followed during the patient's care in the ED.  ____________________________________________   FINAL CLINICAL IMPRESSION(S) / ED DIAGNOSES  Final diagnoses:  Scrotal swelling  Edema, unspecified type      NEW MEDICATIONS STARTED DURING THIS VISIT:  New Prescriptions   No medications on file     Note:  This document was prepared using Dragon voice recognition software and may include unintentional dictation errors.    Dionne BucySiadecki, Khaiden Segreto, MD 06/20/19 1517

## 2019-06-20 NOTE — ED Notes (Signed)
This nurse explained why he is not able to return to [facility] [place] at this time, instructed pt to stay in bed and once we make contact with [employer] on-call worker then he will be able to discharge from ED.

## 2019-06-20 NOTE — ED Notes (Signed)
Called for transport to [place]

## 2019-06-20 NOTE — ED Notes (Signed)
This nurse attempted to contact [facility] and notify them of pt discharge, unable to make contact with them.

## 2019-06-20 NOTE — ED Notes (Signed)
Pt to front desk complaining "I am in a lot of pain. I am in real bad shape and hurting real bad down there." Pt made aware that we are working as fast as we can to get him back to a rm to see MD.

## 2019-06-20 NOTE — ED Provider Notes (Addendum)
[facility] Emergency Department Provider Note   ____________________________________________   First MD Initiated Contact with Patient 06/20/19 1504     (approximate)  I have reviewed the triage vital signs and the nursing notes.   HISTORY  Chief Complaint Shortness of Breath and Groin Swelling   HPI Eric [name] is a 73 y.o. male who was brought to the emergency room with scrotal and penile swelling and some increased shortness of breath.  Patient says is been going on for about a week.  He was being evaluated by Dr. Marisa Severin and then became angry and stormed out but developed he had a guardian so was convinced to come back.  We did an ultrasound of his legs which showed some edema but no DVT we did an ultrasound of his scrotum and penis which showed some thickened skin radiology remarked it was similar to one done 8 months ago.  Chest x-ray was read as COPD no new abnormalities.  Patient was fairly comfortable at his 90% oxygen saturation.  He is still demanding to leave.  He says he has to place to live and does not want to stay here.  He does not have an elevated white count or an elevated troponin or BNP.  His GFR is normal.  He has a temperature of 99 with otherwise normal vital signs.  He does live at the [facility].        Past Medical History:  Diagnosis Date   Acute respiratory failure with hypoxia (HCC)    multiple prior admissions to [facility] and [facility] for CHF exacerbation / respiratory failure   Anxiety and depression    Atrial fibrillation (HCC) 07/25/2018   new-onset during [facility] admission in Jan 2020   Cataract    CHF (congestive heart failure) (HCC)    EF <= 50% as of Jan 2020   Current use of long term anticoagulation 07/2018   Started on apixaban for new-onset a-fib in Jan 2020   Dementia [facility])    possible, likely age-related, documented decreased cognitive function on prior hospitalizations   Gallstones    Glaucoma    Hypertension     Hypoglycemia    Hypothyroidism    Inguinal hernia    Sleep apnea    Thyroid disease     Patient Active Problem List   Diagnosis Date Noted   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 Authorizing Provider  acetaminophen (TYLENOL) 325 MG tablet Take 2 tablets (650 mg total) by mouth every 6 (six) hours as needed for mild pain (or Fever >/= 101). 11/03/18   Alford Highland, MD  albuterol (ACCUNEB) 0.63 MG/3ML nebulizer solution Take 1 ampule by nebulization every 6 (six) hours as needed for shortness of breath.     [provider]  ANORO ELLIPTA 62.5-25 MCG/INH AEPB Inhale 1 puff into the lungs every 6 (six) hours. 10/12/18   [provider]  bimatoprost (  LUMIGAN) 0.01 % SOLN Place 1 drop into both eyes at bedtime.    [provider]  cyanocobalamin 500 MCG tablet Take 500 mcg by mouth 3 (three) times a week.    [provider]  diazepam (VALIUM) 10 MG tablet Take 1 tablet (10 mg total) by mouth every 6 (six) hours as needed for anxiety. 11/03/18   Alford HighlandWieting, Richard, MD  furosemide (LASIX) 20 MG tablet Take 1 tablet (20 mg total) by mouth daily. 11/04/18   Alford HighlandWieting, Richard, MD  levothyroxine (SYNTHROID) 150 MCG tablet Take 1 tablet (150 mcg total) by mouth daily. 11/03/18 11/03/19  Alford HighlandWieting, Richard, MD  liver oil-zinc  oxide (DESITIN) 40 % ointment Apply topically daily. 11/04/18   Alford HighlandWieting, Richard, MD  Melatonin 3 MG TABS Take 1 tablet by mouth daily.    [provider]  metoprolol succinate (TOPROL-XL) 25 MG 24 hr tablet Take 1 tablet (25 mg total) by mouth daily. 11/04/18   Alford HighlandWieting, Richard, MD  OLANZapine (ZYPREXA) 5 MG tablet Take 1 tablet (5 mg total) by mouth once as needed (agitation). 06/09/19   Charm RingsLord, Jamison Y, NP  OLANZapine zydis (ZYPREXA) 10 MG disintegrating tablet Take 1 tablet (10 mg total) by mouth 2 (two) times daily. 11/03/18   Alford HighlandWieting, Richard, MD  senna-docusate (SENEXON-S) 8.6-50 MG tablet Take 2 tablets by mouth 2 (two) times daily.    [provider]  sertraline (ZOLOFT) 25 MG tablet Take 1 tablet (25 mg total) by mouth daily. 06/10/19   Charm RingsLord, Jamison Y, NP  Vitamin D, Ergocalciferol, (DRISDOL) 1.25 MG (50000 UT) CAPS capsule Take 50,000 Units by mouth every 7 (seven) days.    [provider]    Allergies Demerol [meperidine], Epinephrine, Nardil [phenelzine], Morphine and related, Ondansetron, and Shellfish allergy  Family History  Problem Relation Age of Onset   Alzheimer's disease Mother     Social History Social History   Tobacco Use   Smoking status: Former Smoker    Quit date: 10/18/2010    Years since quitting: 8.6   Smokeless tobacco: Never Used  Substance Use Topics   Alcohol use: No   Drug use: No    Review of Systems  Constitutional: No fever/chills Eyes: No visual changes. ENT: No sore throat. Cardiovascular: Denies chest pain. Respiratory: Denies shortness of breath. Gastrointestinal: No abdominal pain.  No nausea, no vomiting.  No diarrhea.  No constipation. Genitourinary: Negative for dysuria. Musculoskeletal: Negative for back pain. Skin: Negative for rash. Neurological: Negative for headaches, focal weakness  ____________________________________________   PHYSICAL EXAM:  VITAL SIGNS: ED Triage Vitals [06/20/19  1147]  Enc Vitals Group     BP (!) 143/88     Pulse Rate 71     Resp 18     Temp 99.2 F (37.3 C)     Temp src      SpO2 99 %     Weight 199 lb 15.3 oz (90.7 kg)     Height 5\' 7"  (1.702 m)     Head Circumference      Peak Flow      Pain Score 10     Pain Loc      Pain Edu?      Excl. in GC?     Constitutional: Alert and oriented. Well appearing and in no acute distress. Eyes: Conjunctivae are normal.  Head: Atraumatic. Nose: No congestion/rhinnorhea. Mouth/Throat: Mucous membranes are moist.  Oropharynx non-erythematous. Neck: No stridor.  Cardiovascular: Normal rate, regular rhythm. Grossly normal heart sounds.  Good peripheral circulation. Respiratory: Normal respiratory effort.  No retractions. Lungs CTAB. Gastrointestinal: Soft and nontender. No distention. No abdominal bruits. No CVA tenderness. Genitourinary: Penis and testicles are both red and swollen but not really hot at all.  They are not tender either. Musculoskeletal: Chronic bilateral leg edema with no redness or tenderness.   Neurologic:  Normal speech and language. No gross focal neurologic deficits are appreciated.  Skin:  Skin is warm, dry and intact. No rash noted.   ____________________________________________   LABS (all labs ordered are listed, but only abnormal results are displayed)  Labs Reviewed  BASIC METABOLIC PANEL - Abnormal; Notable for the following components:      Result Value   Glucose, Bld 104 (*)    Calcium 8.8 (*)    All other components within normal limits  URINALYSIS, COMPLETE (UACMP) WITH MICROSCOPIC - Abnormal; Notable for the following components:   Hgb urine dipstick TRACE (*)    All other components within normal limits  CBC  BRAIN NATRIURETIC PEPTIDE  TROPONIN I (HIGH SENSITIVITY)  TROPONIN I (HIGH SENSITIVITY)   ____________________________________________  EKG  EKG read interpreted by me shows normal sinus rhythm rate of 77 left axis decreased R wave progression  in the precordial leads but no obvious ST-T changes.  The axis change and decreased R wave progression is new from November of this year. ____________________________________________  RADIOLOGY  ED MD interpretation: Chest x-ray read by radiology reviewed by me shows only COPD.  Venous imaging read by radiology reviewed by me shows no DVT.  Ultrasound shows swelling of the skin consistent with cellulitis I reviewed these films as well.  Official radiology report(s): US Venous Img Lower Bilateral  Result Date: 06/20/2019 CLINICAL DATA:  BILATERAL lower extremity swelling EXAM: BILATERAL LOWER EXTREMITY VENOUS DOPPLER ULTRASOUND TECHNIQUE: Gray-scale sonography with graded compression, as well as color Doppler and duplex ultrasound were performed to evaluate the lower extremity deep venous systems from the level of the common femoral vein and including the common femoral, femoral, profunda femoral, popliteal and calf veins including the posterior tibial, peroneal and gastrocnemius veins when visible. The superficial great saphenous vein was also interrogated. Spectral Doppler was utilized to evaluate flow at rest and with distal augmentation maneuvers in the common femoral, femoral and popliteal veins. COMPARISON:  None. FINDINGS: RIGHT LOWER EXTREMITY Common Femoral Vein: No evidence of thrombus. Normal compressibility, respiratory phasicity and response to augmentation. Saphenofemoral Junction: No evidence of thrombus. Normal compressibility and flow on color Doppler imaging. Profunda Femoral Vein: No evidence of thrombus. Normal compressibility and flow on color Doppler imaging. Femoral Vein: No evidence of thrombus. Normal compressibility, respiratory phasicity and response to augmentation. Popliteal Vein: No evidence of thrombus. Normal compressibility, respiratory phasicity and response to augmentation. Calf Veins: No evidence of thrombus. Normal compressibility and flow on color Doppler imaging.  Superficial Great Saphenous Vein: No evidence of thrombus. Normal compressibility. Venous Reflux:  None. Other Findings:  Minimal subcutaneous edema at RIGHT calf LEFT LOWER EXTREMITY Common Femoral Vein: No evidence of thrombus. Normal compressibility, respiratory phasicity and response to augmentation. Saphenofemoral Junction: No evidence of thrombus. Normal compressibility and flow on color Doppler imaging. Profunda Femoral Vein: No evidence of thrombus. Normal compressibility and flow on color Doppler imaging. Femoral Vein: No evidence of thrombus. Normal compressibility, respiratory phasicity and response to augmentation. Popliteal Vein: No evidence of thrombus. Normal compressibility, respiratory phasicity and response to augmentation. Calf Veins: No evidence of thrombus. Normal compressibility and flow on color Doppler imaging. Superficial  Great Saphenous Vein: No evidence of thrombus. Normal compressibility. Venous Reflux:  None. Other Findings:  Minimal subcutaneous edema at LEFT calf IMPRESSION: No evidence of deep venous thrombosis in either lower extremity. Electronically Signed   By: Ulyses Southward M.D.   On: 06/20/2019 17:52   Dg Chest Portable 1 View  Result Date: 06/20/2019 CLINICAL DATA:  Shortness of breath. EXAM: PORTABLE CHEST 1 VIEW COMPARISON:  Radiograph 06/07/2019 FINDINGS: Chronic hyperinflation and interstitial coarsening. Central bronchial thickening. Unchanged heart size and mediastinal contours. Unchanged aortic tortuosity. No focal airspace disease, pneumothorax, or large pleural effusion. No pulmonary edema. Degenerative change of both shoulders. IMPRESSION: Unchanged appearance of the chest with chronic hyperinflation and bronchial thickening, suggesting COPD. No new abnormality is seen. Electronically Signed   By: Narda Rutherford M.D.   On: 06/20/2019 16:55   US Scrotum W/doppler  Result Date: 06/20/2019 CLINICAL DATA:  73 year old with testicular swelling. EXAM: SCROTAL  ULTRASOUND DOPPLER ULTRASOUND OF THE TESTICLES TECHNIQUE: Complete ultrasound examination of the testicles, epididymis, and other scrotal structures was performed. Color and spectral Doppler ultrasound were also utilized to evaluate blood flow to the testicles. COMPARISON:  Scrotal ultrasound 10/29/2018 FINDINGS: Right testicle Measurements: 4.0 x 2.7 x 3.2 cm. Homogeneous echogenicity. Normal blood flow. No mass or microlithiasis visualized. Left testicle Measurements: 3.9 x 2.7 x 2.9 cm. Homogeneous echogenicity. Normal blood flow. No mass or microlithiasis visualized. Right epididymis: Normal in size. Small epididymal head cysts, subcentimeter. No hyperemia. Left epididymis: Normal in size. Small epididymal head cysts, subcentimeter. No hyperemia. Hydrocele: Small to moderate moderate bilaterally, anechoic simple fluid. Similar in size to prior exam. Varicocele: Not definitively visualized combo but not well assessed due to scrotal edema. Pulsed Doppler interrogation of both testes demonstrates normal low resistance arterial and venous waveforms bilaterally. Marked scrotal skin thickening and edematous change of the subcutaneous tissues with hyperemia. No focal fluid collection. Similar findings were seen on prior exam. IMPRESSION: 1. Marked scrotal skin thickening and subcutaneous edema with hyperemia. Findings suspicious for cellulitis. No focal fluid collection. Similar findings were seen on ultrasound 8 months prior. 2. Small to moderate bilateral hydroceles, likely reactive. 3. Normal sonographic appearance of both testes. No evidence of epididymal orchitis. Small epididymal head cysts, incidental. Electronically Signed   By: Narda Rutherford M.D.   On: 06/20/2019 17:39    ____________________________________________   PROCEDURES  Procedure(s) performed (including Critical Care):  Procedures   ____________________________________________   INITIAL IMPRESSION / ASSESSMENT AND PLAN / ED  COURSE Patient adamantly refusing to stay.  Refusing to get any other studies or take any medicines.  He does promise to take his antibiotics by mouth if I let him go home.  As he does not have a fever or a white count I think we should be able to treat him with p.o. Augmentin twice a day if the [place] can keep a close eye on him and return him at once if he gets worse.  I am waiting for the nurse to be able to talk to the [place] to make sure this is doable.  Also he needs to have his legs elevated whenever possible and take his Lasix.  Patient will not do anything else.  I do believe he should be stable to go as long as he can be watched carefully at the [facility] and return if there are any problems.              ____________________________________________   FINAL CLINICAL IMPRESSION(S) / ED DIAGNOSES  Final  diagnoses:  Scrotal swelling  Edema, unspecified type     ED Discharge Orders    None       Note:  This document was prepared using Dragon voice recognition software and may include unintentional dictation errors.    Arnaldo Natal, MD 06/20/19 1836    Arnaldo Natal, MD 06/27/19 1425

## 2019-06-20 NOTE — ED Notes (Signed)
Pt refused discharge vitals, PO antibiotics. MD made aware.

## 2019-06-20 NOTE — ED Notes (Signed)
Patient refuses medication for shortness of breath.  Refuses xray.  Just keeps saying no.  He understands when told he may die if he leaves.  We also told him that he may return at any time if he changes his mind about treatment.  I was going to have him sign ama, but he would not sign the electronic form.  I read it to him, but he wanted it on paper.  I was unable to print the form.  He ambulated to door of lobby and then got in wheel chair to go to to lobby and was calling ride.

## 2019-06-20 NOTE — ED Notes (Signed)
Patient transported to X-ray 

## 2019-06-20 NOTE — ED Notes (Signed)
This nurse spoke with on-call worker and she states this pt can be discharged from [facility] ED and return back to [place].

## 2019-06-20 NOTE — ED Notes (Signed)
This nurse attempted to insert IV and give pt IV lasix, pt states that he is not taking medication because he does not believe in putting stuff into his blood. MD made aware.

## 2019-07-22 ENCOUNTER — Other Ambulatory Visit: Payer: Self-pay

## 2019-07-22 ENCOUNTER — Emergency Department: Payer: Medicare Other

## 2019-07-22 ENCOUNTER — Emergency Department
Admission: EM | Admit: 2019-07-22 | Discharge: 2019-07-22 | Disposition: A | Discharge status: Home / Self Care | Payer: Medicare Other | Attending: Emergency Medicine | Admitting: Emergency Medicine

## 2019-07-22 DIAGNOSIS — F0390 Unspecified dementia without behavioral disturbance: Secondary | ICD-10-CM | POA: Diagnosis not present

## 2019-07-22 DIAGNOSIS — R079 Chest pain, unspecified: Secondary | ICD-10-CM | POA: Diagnosis present

## 2019-07-22 DIAGNOSIS — L0390 Cellulitis, unspecified: Secondary | ICD-10-CM

## 2019-07-22 DIAGNOSIS — L03116 Cellulitis of left lower limb: Secondary | ICD-10-CM | POA: Insufficient documentation

## 2019-07-22 DIAGNOSIS — L03115 Cellulitis of right lower limb: Secondary | ICD-10-CM | POA: Insufficient documentation

## 2019-07-22 DIAGNOSIS — E039 Hypothyroidism, unspecified: Secondary | ICD-10-CM | POA: Insufficient documentation

## 2019-07-22 DIAGNOSIS — I5042 Chronic combined systolic (congestive) and diastolic (congestive) heart failure: Secondary | ICD-10-CM | POA: Insufficient documentation

## 2019-07-22 DIAGNOSIS — I110 Hypertensive heart disease with heart failure: Secondary | ICD-10-CM | POA: Diagnosis not present

## 2019-07-22 MED ORDER — MUPIROCIN 2 % EX OINT: TOPICAL_OINTMENT | CUTANEOUS | 0 refills | Status: DC

## 2019-07-22 MED ORDER — SULFAMETHOXAZOLE-TRIMETHOPRIM 800-160 MG PO TABS
1.0000 | ORAL_TABLET | Freq: Once | ORAL | Status: DC
  Filled 2019-07-22: qty 1

## 2019-07-22 MED ORDER — IVERMECTIN 3 MG PO TABS
200.0000 ug/kg | ORAL_TABLET | Freq: Once | ORAL | Status: DC
  Filled 2019-07-22: qty 7

## 2019-07-22 MED ORDER — PREDNISONE 50 MG PO TABS: ORAL_TABLET | ORAL | 0 refills | Status: DC

## 2019-07-22 MED ORDER — IVERMECTIN 3 MG PO TABS: 200.0000 ug/kg | ORAL_TABLET | Freq: Once | ORAL | 0 refills | Status: AC

## 2019-07-22 MED ORDER — SULFAMETHOXAZOLE-TRIMETHOPRIM 800-160 MG PO TABS: 1.0000 | ORAL_TABLET | Freq: Two times a day (BID) | ORAL | 0 refills | Status: DC

## 2019-07-22 MED ORDER — PREDNISONE 20 MG PO TABS
60.0000 mg | ORAL_TABLET | Freq: Once | ORAL | Status: DC
  Filled 2019-07-22: qty 3

## 2019-07-22 NOTE — ED Provider Notes (Signed)
[facility] Emergency Department Provider Note       Time seen: ----------------------------------------- 8:39 PM on 07/22/2019 -----------------------------------------   I have reviewed the triage vital signs and the nursing notes.  HISTORY   Chief Complaint Chest Pain    HPI Eric [name] is a 74 y.o. male with a history of acute respiratory failure, atrial fibrillation, CHF, dementia, hypertension, hypothyroidism who presents to the ED for mid chest pain.  Patient reportedly seems more confused than normal.  Reportedly had redness noted to his lower extremities, he has chronic wounds for which he has been treated.  Patient has history of dementia, cannot give further review of systems or report.  Past Medical History:  Diagnosis Date  . Acute respiratory failure with hypoxia ([place])    multiple prior admissions to [facility] and [facility] for CHF exacerbation / respiratory failure  . Anxiety and depression   . Atrial fibrillation ([place]) 07/25/2018   new-onset during [facility] admission in Jan 2020  . Cataract   . CHF (congestive heart failure) (HCC)    EF <= 50% as of Jan 2020  . Current use of long term anticoagulation 07/2018   Started on apixaban for new-onset a-fib in Jan 2020  . Dementia ([place])    possible, likely age-related, documented decreased cognitive function on prior hospitalizations  . Gallstones   . Glaucoma   . Hypertension   . Hypoglycemia   . Hypothyroidism   . Inguinal hernia   . Sleep apnea   . Thyroid disease     Patient Active Problem List   Diagnosis Date Noted  . 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 Chronic combined systolic and diastolic CHF (congestive heart failure) ([place]) 11/16/2018  . Anasarca 11/16/2018  . Hypothyroidism 11/16/2018  . PAF (paroxysmal atrial fibrillation) ([place]) 11/16/2018  . HTN (hypertension) 11/16/2018  . Generalized anxiety disorder 11/03/2018  . Major depression  in partial remission ([place]) 11/03/2018  . Acute hypoxemic respiratory failure ([place]) 10/29/2018  . Suspected COVID-19 virus infection 10/29/2018    Past Surgical History:  Procedure Laterality Date  . FINGER AMPUTATION Right     Allergies Demerol [meperidine], Epinephrine, Nardil [phenelzine], Morphine and related, Ondansetron, and Shellfish allergy  Social History Social History   Tobacco Use  . Smoking status: Former Smoker    Quit date: 10/18/2010    Years since quitting: 8.7  . Smokeless tobacco: Never Used  Substance Use Topics  . Alcohol use: No  . Drug use: No   Review of Systems Constitutional: Negative for fever. Cardiovascular: Positive for chest pain Respiratory: Negative for shortness of breath. Gastrointestinal: Negative for abdominal pain, vomiting and diarrhea. Musculoskeletal: Negative for back pain. Skin: Positive for skin redness and erythema Neurological: Negative for headaches, focal weakness or numbness.  All systems negative/normal/unremarkable except as stated in the HPI  ____________________________________________   PHYSICAL EXAM:  VITAL SIGNS: ED Triage Vitals  Enc Vitals Group     BP 07/22/19 2007 109/67     Pulse Rate 07/22/19 2006 80     Resp 07/22/19 2006 18     Temp 07/22/19 2006 (!) 100.4 F (38 C)     Temp Source 07/22/19 2006 Oral     SpO2 07/22/19 2006 93 %     Weight 07/22/19 2007 230 lb (104.3 kg)     Height 07/22/19 2007 5\' 9"  (1.753 m)     Head Circumference --      Peak Flow --  Pain Score 07/22/19 2007 4     Pain Loc --      Pain Edu? --      Excl. in GC? --    Constitutional: Alert but disoriented, disheveled and unkempt appearance, no distress. Eyes: Conjunctivae are normal. Normal extraocular movements. ENT      Head: Normocephalic and atraumatic.      Nose: No congestion/rhinnorhea.      Mouth/Throat: Mucous membranes are moist.      Neck: No stridor. Cardiovascular: Normal rate, regular rhythm. No  murmurs, rubs, or gallops. Respiratory: Normal respiratory effort without tachypnea nor retractions. Breath sounds are clear and equal bilaterally. No wheezes/rales/rhonchi. Gastrointestinal: Soft and nontender. Normal bowel sounds Musculoskeletal: Nontender with normal range of motion in extremities.  Lower extremity pitting edema is noted Neurologic:  Normal speech and language. No gross focal neurologic deficits are appreciated.  Skin: Extensive lower extremity erythema is noted, bilateral edema is noted Psychiatric: Mood and affect are normal. Speech and behavior are normal.  ____________________________________________  EKG: Interpreted by me.  Sinus rhythm with rate of 82 bpm, normal PR interval, normal QRS, normal QT ____________________________________________  ED COURSE:  As part of my medical decision making, I reviewed the following data within the electronic MEDICAL RECORD NUMBER History obtained from family if available, nursing notes, old chart and ekg, as well as notes from prior ED visits. Patient presented for reported chest pain although he denies this to me, we will assess with imaging as indicated at this time.  Patient has refused any blood work.   Procedures  Eric [name] was evaluated in Emergency Department on 07/22/2019 for the symptoms described in the history of present illness. He was evaluated in the context of the global COVID-19 pandemic, which necessitated consideration that the patient might be at risk for infection with the SARS-CoV-2 virus that causes COVID-19. Institutional protocols and algorithms that pertain to the evaluation of patients at risk for COVID-19 are in a state of rapid change based on information released by regulatory bodies including the CDC and federal and state organizations. These policies and algorithms were followed during the patient's care in the ED.  ____________________________________________   RADIOLOGY Images were viewed by  me Chest x-ray  IMPRESSION:  No consolidation or collapse. Question bronchial thickening in the  mid and lower lungs as seen previously.   ____________________________________________   DIFFERENTIAL DIAGNOSIS   Musculoskeletal pain, GERD, anxiety, COVID-19, cellulitis  FINAL ASSESSMENT AND PLAN  Medical screening exam, cellulitis   Plan: The patient had presented for reported chest pain but concerns about lower extremity wounds.  Patient refused any blood work here.  Patient's imaging did not reveal any significant findings on x-ray.  Reportedly he does have Covid but I cannot verify this.  He has some evidence of cellulitis on his lower extremities but most of this appears to be chronic venous stasis dermatitis.  He will be given antibiotics and steroids and is cleared for outpatient follow-up.   Ulice Dash, MD    Note: This note was generated in part or whole with voice recognition software. Voice recognition is usually quite accurate but there are transcription errors that can and very often do occur. I apologize for any typographical errors that were not detected and corrected.     Emily Filbert, MD 07/22/19 2129

## 2019-07-22 NOTE — ED Triage Notes (Signed)
Pt arrives with cc of mid chest pain. Pt seems more confused than normal. Pt has abscess on R knuckle. R calf has red, scaly skin w foul smell wrapped w gauze dressing.

## 2019-07-22 NOTE — ED Notes (Signed)
Patient to waiting room via wheelchair by EMS.  Per EMS patient is from the [facility] with compliant chest pain, short of breath and anxiety.  Patient is COVID positive. EMS vital signs - BP 138/84,  Hr 78, pulse oxi  92-93% room air, 12-lead within normal limits.

## 2019-11-16 ENCOUNTER — Encounter (HOSPITAL_COMMUNITY): Payer: Self-pay

## 2019-11-16 ENCOUNTER — Inpatient Hospital Stay (HOSPITAL_COMMUNITY)
Admission: EM | Admit: 2019-11-16 | Discharge: 2019-11-21 | DRG: 602 | Disposition: A | Discharge status: Skilled Nursing Facility | Payer: Medicare Other | Attending: Internal Medicine | Admitting: Internal Medicine

## 2019-11-16 ENCOUNTER — Inpatient Hospital Stay (HOSPITAL_COMMUNITY): Payer: Medicare Other

## 2019-11-16 ENCOUNTER — Other Ambulatory Visit: Payer: Self-pay

## 2019-11-16 ENCOUNTER — Emergency Department (HOSPITAL_COMMUNITY): Payer: Medicare Other

## 2019-11-16 DIAGNOSIS — Z8616 Personal history of COVID-19: Secondary | ICD-10-CM

## 2019-11-16 DIAGNOSIS — F419 Anxiety disorder, unspecified: Secondary | ICD-10-CM | POA: Diagnosis present

## 2019-11-16 DIAGNOSIS — E11621 Type 2 diabetes mellitus with foot ulcer: Secondary | ICD-10-CM

## 2019-11-16 DIAGNOSIS — R946 Abnormal results of thyroid function studies: Secondary | ICD-10-CM | POA: Diagnosis present

## 2019-11-16 DIAGNOSIS — I110 Hypertensive heart disease with heart failure: Secondary | ICD-10-CM | POA: Diagnosis present

## 2019-11-16 DIAGNOSIS — E039 Hypothyroidism, unspecified: Secondary | ICD-10-CM | POA: Diagnosis present

## 2019-11-16 DIAGNOSIS — Z6829 Body mass index (BMI) 29.0-29.9, adult: Secondary | ICD-10-CM | POA: Diagnosis not present

## 2019-11-16 DIAGNOSIS — G4730 Sleep apnea, unspecified: Secondary | ICD-10-CM | POA: Diagnosis present

## 2019-11-16 DIAGNOSIS — L97519 Non-pressure chronic ulcer of other part of right foot with unspecified severity: Secondary | ICD-10-CM | POA: Diagnosis not present

## 2019-11-16 DIAGNOSIS — L97419 Non-pressure chronic ulcer of right heel and midfoot with unspecified severity: Secondary | ICD-10-CM | POA: Diagnosis present

## 2019-11-16 DIAGNOSIS — F0390 Unspecified dementia without behavioral disturbance: Secondary | ICD-10-CM | POA: Diagnosis present

## 2019-11-16 DIAGNOSIS — G629 Polyneuropathy, unspecified: Secondary | ICD-10-CM | POA: Diagnosis present

## 2019-11-16 DIAGNOSIS — Z79899 Other long term (current) drug therapy: Secondary | ICD-10-CM

## 2019-11-16 DIAGNOSIS — D72829 Elevated white blood cell count, unspecified: Secondary | ICD-10-CM

## 2019-11-16 DIAGNOSIS — I10 Essential (primary) hypertension: Secondary | ICD-10-CM | POA: Diagnosis present

## 2019-11-16 DIAGNOSIS — M869 Osteomyelitis, unspecified: Secondary | ICD-10-CM | POA: Diagnosis present

## 2019-11-16 DIAGNOSIS — Z781 Physical restraint status: Secondary | ICD-10-CM | POA: Diagnosis not present

## 2019-11-16 DIAGNOSIS — I5042 Chronic combined systolic (congestive) and diastolic (congestive) heart failure: Secondary | ICD-10-CM | POA: Diagnosis present

## 2019-11-16 DIAGNOSIS — E871 Hypo-osmolality and hyponatremia: Secondary | ICD-10-CM | POA: Diagnosis present

## 2019-11-16 DIAGNOSIS — E43 Unspecified severe protein-calorie malnutrition: Secondary | ICD-10-CM | POA: Diagnosis present

## 2019-11-16 DIAGNOSIS — I480 Paroxysmal atrial fibrillation: Secondary | ICD-10-CM | POA: Diagnosis present

## 2019-11-16 DIAGNOSIS — Z91013 Allergy to seafood: Secondary | ICD-10-CM

## 2019-11-16 DIAGNOSIS — Z7901 Long term (current) use of anticoagulants: Secondary | ICD-10-CM

## 2019-11-16 DIAGNOSIS — Z0184 Encounter for antibody response examination: Secondary | ICD-10-CM

## 2019-11-16 DIAGNOSIS — L03115 Cellulitis of right lower limb: Principal | ICD-10-CM | POA: Diagnosis present

## 2019-11-16 DIAGNOSIS — F05 Delirium due to known physiological condition: Secondary | ICD-10-CM | POA: Diagnosis not present

## 2019-11-16 DIAGNOSIS — L0390 Cellulitis, unspecified: Secondary | ICD-10-CM | POA: Diagnosis not present

## 2019-11-16 DIAGNOSIS — Z87891 Personal history of nicotine dependence: Secondary | ICD-10-CM

## 2019-11-16 DIAGNOSIS — F329 Major depressive disorder, single episode, unspecified: Secondary | ICD-10-CM | POA: Diagnosis present

## 2019-11-16 DIAGNOSIS — J189 Pneumonia, unspecified organism: Secondary | ICD-10-CM

## 2019-11-16 DIAGNOSIS — Z79890 Hormone replacement therapy (postmenopausal): Secondary | ICD-10-CM

## 2019-11-16 DIAGNOSIS — J9611 Chronic respiratory failure with hypoxia: Secondary | ICD-10-CM | POA: Diagnosis present

## 2019-11-16 DIAGNOSIS — T501X5A Adverse effect of loop [high-ceiling] diuretics, initial encounter: Secondary | ICD-10-CM | POA: Diagnosis not present

## 2019-11-16 DIAGNOSIS — I96 Gangrene, not elsewhere classified: Secondary | ICD-10-CM | POA: Diagnosis not present

## 2019-11-16 DIAGNOSIS — L97516 Non-pressure chronic ulcer of other part of right foot with bone involvement without evidence of necrosis: Secondary | ICD-10-CM | POA: Diagnosis not present

## 2019-11-16 DIAGNOSIS — Z792 Long term (current) use of antibiotics: Secondary | ICD-10-CM

## 2019-11-16 DIAGNOSIS — Z89021 Acquired absence of right finger(s): Secondary | ICD-10-CM

## 2019-11-16 DIAGNOSIS — Z1389 Encounter for screening for other disorder: Secondary | ICD-10-CM

## 2019-11-16 LAB — CBC WITH DIFFERENTIAL/PLATELET
Abs Immature Granulocytes: 0.29 10*3/uL — ABNORMAL HIGH (ref 0.00–0.07)
Basophils Absolute: 0.1 10*3/uL (ref 0.0–0.1)
Basophils Relative: 0 %
Eosinophils Absolute: 0.1 10*3/uL (ref 0.0–0.5)
Eosinophils Relative: 1 %
HCT: 43.5 % (ref 39.0–52.0)
Hemoglobin: 14.5 g/dL (ref 13.0–17.0)
Immature Granulocytes: 2 %
Lymphocytes Relative: 3 %
Lymphs Abs: 0.5 10*3/uL — ABNORMAL LOW (ref 0.7–4.0)
MCH: 30.6 pg (ref 26.0–34.0)
MCHC: 33.3 g/dL (ref 30.0–36.0)
MCV: 91.8 fL (ref 80.0–100.0)
Monocytes Absolute: 1.4 10*3/uL — ABNORMAL HIGH (ref 0.1–1.0)
Monocytes Relative: 7 %
Neutro Abs: 16.3 10*3/uL — ABNORMAL HIGH (ref 1.7–7.7)
Neutrophils Relative %: 87 %
Platelets: 271 10*3/uL (ref 150–400)
RBC: 4.74 MIL/uL (ref 4.22–5.81)
RDW: 13.6 % (ref 11.5–15.5)
WBC: 18.7 10*3/uL — ABNORMAL HIGH (ref 4.0–10.5)
nRBC: 0 % (ref 0.0–0.2)

## 2019-11-16 LAB — CREATININE, SERUM
Creatinine, Ser: 1.24 mg/dL (ref 0.61–1.24)
GFR calc Af Amer: >60 mL/min (ref >60)
GFR calc non Af Amer: 57 mL/min — ABNORMAL LOW (ref >60)

## 2019-11-16 LAB — LACTIC ACID, PLASMA: Lactic Acid, Venous: 1.6 mmol/L (ref 0.5–1.9)

## 2019-11-16 LAB — BASIC METABOLIC PANEL
Anion gap: 10 (ref 5–15)
BUN: 27 mg/dL — ABNORMAL HIGH (ref 8–23)
CO2: 20 mmol/L — ABNORMAL LOW (ref 22–32)
Calcium: 8.1 mg/dL — ABNORMAL LOW (ref 8.9–10.3)
Chloride: 103 mmol/L (ref 98–111)
Creatinine, Ser: 1.15 mg/dL (ref 0.61–1.24)
GFR calc Af Amer: >60 mL/min (ref >60)
GFR calc non Af Amer: >60 mL/min (ref >60)
Glucose, Bld: 124 mg/dL — ABNORMAL HIGH (ref 70–99)
Potassium: 4.6 mmol/L (ref 3.5–5.1)
Sodium: 133 mmol/L — ABNORMAL LOW (ref 135–145)

## 2019-11-16 LAB — CBC
HCT: 42 % (ref 39.0–52.0)
Hemoglobin: 13.6 g/dL (ref 13.0–17.0)
MCH: 30.6 pg (ref 26.0–34.0)
MCHC: 32.4 g/dL (ref 30.0–36.0)
MCV: 94.4 fL (ref 80.0–100.0)
Platelets: 239 10*3/uL (ref 150–400)
RBC: 4.45 MIL/uL (ref 4.22–5.81)
RDW: 13.5 % (ref 11.5–15.5)
WBC: 16.8 10*3/uL — ABNORMAL HIGH (ref 4.0–10.5)
nRBC: 0 % (ref 0.0–0.2)

## 2019-11-16 LAB — RESPIRATORY PANEL BY RT PCR (FLU A&B, COVID)
Influenza A by PCR: NEGATIVE
Influenza B by PCR: NEGATIVE
SARS Coronavirus 2 by RT PCR: POSITIVE — AB

## 2019-11-16 LAB — MAGNESIUM: Magnesium: 2.3 mg/dL (ref 1.7–2.4)

## 2019-11-16 LAB — SEDIMENTATION RATE: Sed Rate: 44 mm/hr — ABNORMAL HIGH (ref 0–16)

## 2019-11-16 LAB — C-REACTIVE PROTEIN: CRP: 1.1 mg/dL — ABNORMAL HIGH (ref <1.0)

## 2019-11-16 MED ORDER — ENOXAPARIN SODIUM 40 MG/0.4ML ~~LOC~~ SOLN
40.0000 mg | SUBCUTANEOUS | Status: DC
  Administered 2019-11-17 – 2019-11-18 (×2): 40 mg via SUBCUTANEOUS
  Filled 2019-11-16 (×3): qty 0.4

## 2019-11-16 MED ORDER — OLANZAPINE 5 MG PO TABS: 5.0000 mg | ORAL_TABLET | Freq: Once | ORAL | Status: DC | PRN

## 2019-11-16 MED ORDER — VANCOMYCIN HCL 2000 MG/400ML IV SOLN
2000.0000 mg | Freq: Once | INTRAVENOUS | Status: AC
  Administered 2019-11-16: 2000 mg via INTRAVENOUS
  Filled 2019-11-16: qty 400

## 2019-11-16 MED ORDER — HALOPERIDOL LACTATE 5 MG/ML IJ SOLN
5.0000 mg | Freq: Once | INTRAMUSCULAR | Status: AC
  Administered 2019-11-16: 21:00:00 5 mg via INTRAMUSCULAR
  Filled 2019-11-16: qty 1

## 2019-11-16 MED ORDER — VANCOMYCIN HCL 750 MG/150ML IV SOLN
750.0000 mg | Freq: Two times a day (BID) | INTRAVENOUS | Status: DC
  Administered 2019-11-17 – 2019-11-19 (×5): 750 mg via INTRAVENOUS
  Filled 2019-11-16 (×7): qty 150

## 2019-11-16 MED ORDER — PIPERACILLIN-TAZOBACTAM 3.375 G IVPB
3.3750 g | Freq: Three times a day (TID) | INTRAVENOUS | Status: DC
  Administered 2019-11-17 – 2019-11-19 (×8): 3.375 g via INTRAVENOUS
  Filled 2019-11-16 (×9): qty 50

## 2019-11-16 MED ORDER — SERTRALINE HCL 25 MG PO TABS
25.0000 mg | ORAL_TABLET | Freq: Every day | ORAL | Status: DC
  Administered 2019-11-17 – 2019-11-21 (×4): 25 mg via ORAL
  Filled 2019-11-16 (×5): qty 1

## 2019-11-16 MED ORDER — OLANZAPINE 5 MG PO TBDP: 10.0000 mg | ORAL_TABLET | Freq: Two times a day (BID) | ORAL | Status: DC

## 2019-11-16 MED ORDER — VANCOMYCIN HCL IN DEXTROSE 1-5 GM/200ML-% IV SOLN: 1000.0000 mg | Freq: Once | INTRAVENOUS | Status: DC

## 2019-11-16 MED ORDER — ACETAMINOPHEN 650 MG RE SUPP: 650.0000 mg | Freq: Four times a day (QID) | RECTAL | Status: DC | PRN

## 2019-11-16 MED ORDER — ACETAMINOPHEN 325 MG PO TABS: 650.0000 mg | ORAL_TABLET | Freq: Four times a day (QID) | ORAL | Status: DC | PRN

## 2019-11-16 MED ORDER — LEVOTHYROXINE SODIUM 75 MCG PO TABS
175.0000 ug | ORAL_TABLET | Freq: Every day | ORAL | Status: DC
  Administered 2019-11-17: 175 ug via ORAL
  Filled 2019-11-16: qty 1

## 2019-11-16 MED ORDER — DIAZEPAM 2 MG PO TABS: 1.0000 mg | ORAL_TABLET | ORAL | Status: DC

## 2019-11-16 MED ORDER — PIPERACILLIN-TAZOBACTAM 3.375 G IVPB 30 MIN: 3.3750 g | Freq: Once | INTRAVENOUS | Status: DC

## 2019-11-16 MED ORDER — LORAZEPAM 1 MG PO TABS
1.0000 mg | ORAL_TABLET | Freq: Once | ORAL | Status: AC
  Administered 2019-11-16: 19:00:00 1 mg via ORAL
  Filled 2019-11-16: qty 1

## 2019-11-16 MED ORDER — ALBUTEROL SULFATE (2.5 MG/3ML) 0.083% IN NEBU: 0.6300 mg | INHALATION_SOLUTION | Freq: Four times a day (QID) | RESPIRATORY_TRACT | Status: DC | PRN

## 2019-11-16 MED ORDER — SODIUM CHLORIDE 0.9 % IV SOLN: INTRAVENOUS | Status: DC

## 2019-11-16 MED ORDER — PIPERACILLIN-TAZOBACTAM 3.375 G IVPB 30 MIN
3.3750 g | Freq: Once | INTRAVENOUS | Status: AC
  Administered 2019-11-16: 17:00:00 3.375 g via INTRAVENOUS
  Filled 2019-11-16: qty 50

## 2019-11-16 NOTE — Progress Notes (Signed)
Patient is refusing all scans

## 2019-11-16 NOTE — Progress Notes (Signed)
Pharmacy Antibiotic Note  Eric [name] is a 74 y.o. male admitted on 11/16/2019 with cellulitis.  Pharmacy has been consulted for Zosyn and vancomycin dosing. WBC elevated at 18.7. SCr 1.15. AF  Plan: -Zosyn 3.375 gm IV Q 8 hours (EI infusion) -Vancomycin 2 gm IV load, followed by vancomycin 750 mg IV Q 12 hours per traditional nomogram  -Monitor CBC, renal fx, cultures and clinical progress -VT at SS      Temp (24hrs), Avg:97.5 F (36.4 C), Min:97.5 F (36.4 C), Max:97.5 F (36.4 C)  Recent Labs  Lab 11/16/19 1517  WBC 18.7*  CREATININE 1.15    CrCl cannot be calculated (Unknown ideal weight.).    Allergies  Allergen Reactions  . Demerol [Meperidine] Other (See Comments)    Will counteract with a drug he is taking causing fatal reaction with nardil  . Epinephrine Other (See Comments)    "Nardil reaction-With epi?" "Allergic," per MAR  . Nardil [Phenelzine] Other (See Comments)    "Allergic," per MAR  . Morphine And Related Other (See Comments)    Reacts with nardil- "Allergic," per MAR  . Ondansetron Other (See Comments)    Made patient want to climb the walls- "Allergic," per MAR  . Shellfish Allergy Hives    Antimicrobials this admission: Zosyn 4/30 >>  Vancomycin 4/30 >>   Dose adjustments this admission:   Microbiology results: 4/30 BCx:    Thank you for allowing pharmacy to be a part of this patient's care.  Vinnie Level, PharmD., BCPS, BCCCP Clinical Pharmacist Clinical phone for 11/16/19 until 11pm: [phone] If after 11pm, please refer to [facility] for unit-specific pharmacist

## 2019-11-16 NOTE — ED Triage Notes (Addendum)
Pt bib [facility] from [facility] [place] w/ c/o R foot infection. Pt poor historian, angry irritable, noncompliant w/ treatment prior to this. Pt denies fever, rates pain 5/10. Pt AOx4, EMS VSS.

## 2019-11-16 NOTE — ED Provider Notes (Signed)
[facility] EMERGENCY DEPARTMENT Provider Note   CSN: [id number] Arrival date & time: 11/16/19  1424     History Chief Complaint  Patient presents with  . Wound Infection    Eric [name] is a 74 y.o. male.  Presents to emergency department with concern for foot infection.  Patient states that he has had ulcer on the base of his right foot for a long time, cannot remember how long.  States that he also has chronic swelling and redness of both of his lower legs.  States that his feet and wound is managed by the nurses at his nursing facility.  States he does not routinely see a wound care doctor.  Denies recent antibiotic use.  HPI     Past Medical History:  Diagnosis Date  . Acute respiratory failure with hypoxia (HCC)    multiple prior admissions to [facility] and [facility] for CHF exacerbation / respiratory failure  . Anxiety and depression   . Atrial fibrillation (HCC) 07/25/2018   new-onset during [facility] admission in Jan 2020  . Cataract   . CHF (congestive heart failure) (HCC)    EF <= 50% as of Jan 2020  . Current use of long term anticoagulation 07/2018   Started on apixaban for new-onset a-fib in Jan 2020  . Dementia (HCC)    possible, likely age-related, documented decreased cognitive function on prior hospitalizations  . Gallstones   . Glaucoma   . Hypertension   . Hypoglycemia   . Hypothyroidism   . Inguinal hernia   . Sleep apnea   . Thyroid disease     Patient Active Problem List   Diagnosis Date Noted  . Right foot ulcer (HCC) 11/16/2019  . Anxiety and depression   . Cellulitis of right foot   . 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 Chronic combined systolic and diastolic CHF (congestive heart failure) (HCC) 11/16/2018  . Anasarca 11/16/2018  . Hypothyroidism 11/16/2018  . PAF (paroxysmal atrial fibrillation) (HCC) 11/16/2018  . HTN (hypertension) 11/16/2018  . Generalized anxiety disorder  11/03/2018  . Major depression in partial remission (HCC) 11/03/2018  . Acute hypoxemic respiratory failure (HCC) 10/29/2018  . Suspected COVID-19 virus infection 10/29/2018    Past Surgical History:  Procedure Laterality Date  . FINGER AMPUTATION Right        Family History  Problem Relation Age of Onset  . Alzheimer's disease Mother     Social History   Tobacco Use  . Smoking status: Former Smoker    Quit date: 10/18/2010    Years since quitting: 9.0  . Smokeless tobacco: Never Used  Substance Use Topics  . Alcohol use: No  . Drug use: No    Home Medications Prior to Admission medications   Medication Sig Start Date End Date Taking? Authorizing Provider  acetaminophen (TYLENOL) 325 MG tablet Take 2 tablets (650 mg total) by mouth every 6 (six) hours as needed for mild pain (or Fever >/= 101). Patient taking differently: Take 650 mg by mouth every 6 (six) hours as needed for mild pain or headache (fever >/= 101, or minor discomfort).  11/03/18  Yes Wieting, Unice Vantassel, MD  albuterol (ACCUNEB) 0.63 MG/3ML nebulizer solution Take 1 ampule by nebulization every 6 (six) hours as needed for shortness of breath.   Yes [provider]  alum & mag hydroxide-simeth (ANTACID) 200-200-20 MG/5ML suspension Take 30 mLs by mouth 4 (four) times daily as needed for indigestion or heartburn.  Yes [provider]  diazepam (VALIUM) 2 MG tablet Take 1-2 mg by mouth See admin instructions. Take 1 mg by mouth in the morning and 2 mg at bedtime   Yes [provider]  diphenhydrAMINE (DIPHENHIST) 25 MG tablet Take 25 mg by mouth every 6 (six) hours as needed (for allergic reactions).   Yes [provider]  furosemide (LASIX) 40 MG tablet Take 40 mg by mouth 2 (two) times daily.   Yes [provider]  guaiFENesin (ROBITUSSIN) 100 MG/5ML liquid Take 300 mg by mouth every 6 (six) hours as needed for cough.   Yes [provider]  levofloxacin (LEVAQUIN)  750 MG tablet Take 750 mg by mouth daily. 11/10/19 11/17/19 Yes [provider]  levothyroxine (SYNTHROID) 175 MCG tablet Take 175 mcg by mouth daily before breakfast.   Yes [provider]  liver oil-zinc oxide (DESITIN) 40 % ointment Apply topically daily. Patient taking differently: Apply 1 application topically See admin instructions. Apply to buttocks once daily 11/04/18  Yes Wieting, Jaydin Boniface, MD  loperamide (IMODIUM A-D) 2 MG tablet Take 4 mg by mouth every 3 (three) hours as needed for diarrhea or loose stools (CANNOT EXCEED 8 DOSES/24 HOURS).   Yes [provider]  magnesium hydroxide (MILK OF MAGNESIA) 400 MG/5ML suspension Take 30 mLs by mouth daily as needed for mild constipation.   Yes [provider]  metoCLOPramide (REGLAN) 5 MG tablet Take 5 mg by mouth every 6 (six) hours as needed for nausea or vomiting.   Yes [provider]  Multiple Vitamins-Minerals (THERATRUM COMPLETE) TABS Take 1 tablet by mouth daily with breakfast.   Yes [provider]  nystatin cream (MYCOSTATIN) Apply 1 application topically See admin instructions. Apply to right lower leg 2 times a week   Yes [provider]  OLANZapine (ZYPREXA) 5 MG tablet Take 1 tablet (5 mg total) by mouth once as needed (agitation). Patient taking differently: Take 5 mg by mouth daily as needed (for agitation).  06/09/19  Yes Lord, Herminio HeadsJamison Y, NP  OLANZapine zydis (ZYPREXA) 10 MG disintegrating tablet Take 1 tablet (10 mg total) by mouth 2 (two) times daily. 11/03/18  Yes Wieting, Karandeep Resende, MD  OXYGEN Inhale 2 L/min into the lungs See admin instructions. "2 L/min, as tolerated"   Yes [provider]  sertraline (ZOLOFT) 25 MG tablet Take 1 tablet (25 mg total) by mouth daily. 06/10/19  Yes Charm RingsLord, Jamison Y, NP  Skin Protectants, Misc. (EUCERIN) cream Apply 1 application topically See admin instructions. Apply to both legs 2 times a day   Yes [provider]   diazepam (VALIUM) 10 MG tablet Take 1 tablet (10 mg total) by mouth every 6 (six) hours as needed for anxiety. Patient not taking: Reported on 11/16/2019 11/03/18   Alford HighlandWieting, Debrah Granderson, MD  furosemide (LASIX) 20 MG tablet Take 1 tablet (20 mg total) by mouth daily. Patient not taking: Reported on 11/16/2019 11/04/18   Alford HighlandWieting, Shazia Mitchener, MD  levothyroxine (SYNTHROID) 150 MCG tablet Take 1 tablet (150 mcg total) by mouth daily. Patient not taking: Reported on 11/16/2019 11/03/18 11/16/19  Alford HighlandWieting, Lowery Paullin, MD  metoprolol succinate (TOPROL-XL) 25 MG 24 hr tablet Take 1 tablet (25 mg total) by mouth daily. Patient not taking: Reported on 11/16/2019 11/04/18   Alford HighlandWieting, Leon Goodnow, MD  mupirocin ointment Idelle Jo(BACTROBAN) 2 % Apply to affected area 3 times daily Patient not taking: Reported on 11/16/2019 07/22/19 07/21/20  Emily FilbertWilliams, Jonathan E, MD  predniSONE (DELTASONE) 50 MG tablet Take  1 tablet by mouth daily Patient not taking: Reported on 11/16/2019 07/22/19   Emily Filbert, MD  sulfamethoxazole-trimethoprim (BACTRIM DS) 800-160 MG tablet Take 1 tablet by mouth 2 (two) times daily. Patient not taking: Reported on 11/16/2019 07/22/19   Emily Filbert, MD    Allergies    Demerol [meperidine], Epinephrine, Nardil [phenelzine], Morphine and related, Ondansetron, and Shellfish allergy  Review of Systems   Review of Systems  Constitutional: Negative for chills and fever.  HENT: Negative for ear pain and sore throat.   Eyes: Negative for pain and visual disturbance.  Respiratory: Negative for cough and shortness of breath.   Cardiovascular: Negative for chest pain and palpitations.  Gastrointestinal: Negative for abdominal pain and vomiting.  Genitourinary: Negative for dysuria and hematuria.  Musculoskeletal: Positive for arthralgias. Negative for back pain.  Skin: Negative for color change and rash.  Neurological: Negative for seizures and syncope.  All other systems reviewed and are negative.   Physical  Exam Updated Vital Signs BP (!) 103/55   Pulse 86   Temp (!) 97.5 F (36.4 C) (Oral)   Resp 14   SpO2 96%   Physical Exam Vitals and nursing note reviewed.  Constitutional:      Appearance: He is well-developed.  HENT:     Head: Normocephalic and atraumatic.  Eyes:     Conjunctiva/sclera: Conjunctivae normal.  Cardiovascular:     Rate and Rhythm: Normal rate and regular rhythm.     Heart sounds: No murmur.  Pulmonary:     Effort: Pulmonary effort is normal. No respiratory distress.     Breath sounds: Normal breath sounds.  Abdominal:     Palpations: Abdomen is soft.     Tenderness: There is no abdominal tenderness.  Musculoskeletal:     Cervical back: Neck supple.     Comments: Lower extremities: bilateral lower lower leg and feet with generalized erythema, slightly more pronounced on right, right heel has ulcer at base 1cm diameter, no purulence, right foot is warm to touch  Skin:    General: Skin is warm and dry.  Neurological:     General: No focal deficit present.     Mental Status: He is alert and oriented to person, place, and time.  Psychiatric:        Mood and Affect: Mood normal.        Behavior: Behavior normal.     ED Results / Procedures / Treatments   Labs (all labs ordered are listed, but only abnormal results are displayed) Labs Reviewed  RESPIRATORY PANEL BY RT PCR (FLU A&B, COVID) - Abnormal; Notable for the following components:      Result Value   SARS Coronavirus 2 by RT PCR POSITIVE (*)    All other components within normal limits  CBC WITH DIFFERENTIAL/PLATELET - Abnormal; Notable for the following components:   WBC 18.7 (*)    Neutro Abs 16.3 (*)    Lymphs Abs 0.5 (*)    Monocytes Absolute 1.4 (*)    Abs Immature Granulocytes 0.29 (*)    All other components within normal limits  BASIC METABOLIC PANEL - Abnormal; Notable for the following components:   Sodium 133 (*)    CO2 20 (*)    Glucose, Bld 124 (*)    BUN 27 (*)    Calcium 8.1  (*)    All other components within normal limits  SEDIMENTATION RATE - Abnormal; Notable for the following components:   Sed Rate 44 (*)  All other components within normal limits  C-REACTIVE PROTEIN - Abnormal; Notable for the following components:   CRP 1.1 (*)    All other components within normal limits  CBC - Abnormal; Notable for the following components:   WBC 16.8 (*)    All other components within normal limits  CREATININE, SERUM - Abnormal; Notable for the following components:   GFR calc non Af Amer 57 (*)    All other components within normal limits  CULTURE, BLOOD (ROUTINE X 2)  CULTURE, BLOOD (ROUTINE X 2)  AEROBIC CULTURE (SUPERFICIAL SPECIMEN)  LACTIC ACID, PLASMA  MAGNESIUM  LACTIC ACID, PLASMA  CBC  COMPREHENSIVE METABOLIC PANEL    EKG EKG Interpretation  Date/Time:  Friday November 16 2019 21:11:36 EDT Ventricular Rate:  93 PR Interval:    QRS Duration: 76 QT Interval:  360 QTC Calculation: 448 R Axis:   43 Text Interpretation: Sinus rhythm Borderline prolonged PR interval Anterior infarct, old When compared with ECG of 07/22/2019, No significant change was found Confirmed by Dione Booze ([id number]) on 11/16/2019 11:10:35 PM   Radiology DG Foot 2 Views Right  Result Date: 11/16/2019 CLINICAL DATA:  Right heel ulceration, evaluate for evidence of osteomyelitis EXAM: RIGHT FOOT - 2 VIEW COMPARISON:  08/24/2013 FINDINGS: Osteopenia. No fracture or dislocation. Joint spaces are preserved. Soft tissue ulceration of the plantar heel without evidence of underlying bony erosion or sclerosis. Diffuse soft tissue edema about the foot and ankle. IMPRESSION: 1.  Osteopenia.  No fracture or dislocation of the right foot. 2. Soft tissue ulceration of the plantar heel without evidence of underlying bony erosion or sclerosis. Consider MRI to more sensitively evaluate for bone marrow edema and osteomyelitis if clinically suspected. 3.  Soft tissue edema about the foot and ankle.  Electronically Signed   By: Lauralyn Primes M.D.   On: 11/16/2019 15:27    Procedures Procedures (including critical care time)  Medications Ordered in ED Medications  enoxaparin (LOVENOX) injection 40 mg (40 mg Subcutaneous Refused 11/16/19 1838)  acetaminophen (TYLENOL) tablet 650 mg (has no administration in time range)    Or  acetaminophen (TYLENOL) suppository 650 mg (has no administration in time range)  piperacillin-tazobactam (ZOSYN) IVPB 3.375 g (has no administration in time range)  vancomycin (VANCOREADY) IVPB 750 mg/150 mL (has no administration in time range)  OLANZapine (ZYPREXA) tablet 5 mg (has no administration in time range)  diazepam (VALIUM) tablet 1-2 mg (has no administration in time range)  OLANZapine zydis (ZYPREXA) disintegrating tablet 10 mg (10 mg Oral Refused 11/16/19 2358)  sertraline (ZOLOFT) tablet 25 mg (has no administration in time range)  levothyroxine (SYNTHROID) tablet 175 mcg (has no administration in time range)  albuterol (PROVENTIL) (2.5 MG/3ML) 0.083% nebulizer solution 0.63 mg (has no administration in time range)  piperacillin-tazobactam (ZOSYN) IVPB 3.375 g (0 g Intravenous Stopped 11/16/19 1754)  vancomycin (VANCOREADY) IVPB 2000 mg/400 mL (2,000 mg Intravenous New Bag/Given 11/16/19 2203)  LORazepam (ATIVAN) tablet 1 mg (1 mg Oral Given 11/16/19 1847)  haloperidol lactate (HALDOL) injection 5 mg (5 mg Intramuscular Given 11/16/19 2052)    ED Course  I have reviewed the triage vital signs and the nursing notes.  Pertinent labs & imaging results that were available during my care of the patient were reviewed by me and considered in my medical decision making (see chart for details).  Clinical Course as of Nov 16 1  Fri Nov 16, 2019  1632 WBC(!): 18.7 [RD]  1632 Reviewed labs and imaging, will  get   DG Foot 2 Views Right [RD]  1706 D/w Stann Mainland who reviewed imaging - agrees with MRI, admission, empiric abx, requests in patient consults wound  care, he will route case to [facility] and ask him to see in consult, no emergent intervention needed tonight   [RD]  49 D/w Pahwani   [RD]    Clinical Course User Index [RD] Lucrezia Starch, MD   MDM Rules/Calculators/A&P                      74 year old male presented to ER with concern for acute on chronic worsening of lower extremity wound.  On exam noted significant erythema over both lower legs, worse on right leg, associated with heel ulcer.  Labs notable for leukocytosis.  Concern for cellulitis associated with his chronic wound as well as possible deeper tissue infection.  X-ray was negative, ordered MRI to further evaluate, started IV antibiotics.  Reviewed case with orthopedics, Sharol Given to see tomorrow in a.m.  Consult hospitalist for admission.  Dr. Doristine Bosworth accepting.   Final Clinical Impression(s) / ED Diagnoses Final diagnoses:  Cellulitis of right lower extremity  Diabetic ulcer of right heel associated with type 2 diabetes mellitus, unspecified ulcer stage (HCC)  Leukocytosis, unspecified type    Rx / DC Orders ED Discharge Orders    None       Lucrezia Starch, MD 11/17/19 0003

## 2019-11-16 NOTE — Progress Notes (Signed)
I was called in consultation by the emergency department, Dr. Stevie Kern, in regards to this patient's chronic appearing heel ulcer on the right.  He has developed some surrounding erythema concerning for expanding cellulitis in addition to the obvious heel ulcer.  Currently he does have an elevated white blood cell count but is not physiologically decompensated.  We discussed the patient and his presentation at length and there is no concern for emergent operative need at this time.   I have contacted Dr. Aldean Baker who is rounding this weekend, and he has agreed to see the patient over the weekend and assess for any further recommendations.  We will also follow-up on the MRI.  Full consultation note to come once evaluated by Dr. Lajoyce Corners.

## 2019-11-16 NOTE — ED Notes (Signed)
Pt pulled out IV x 2. Pt refusing chest x-ray at this time. Pt refusing wound culture at this time. Pt refusing new IV at this time. 1 mg of PO ativan given.

## 2019-11-16 NOTE — ED Notes (Signed)
Pt refused EKG.

## 2019-11-16 NOTE — H&P (Signed)
History and Physical    Eric [name] PXT:[id number] DOB: [birth year] DOA: 11/16/2019  PCP: System, Pcp Not In  [facility]: [facility]  I have personally briefly reviewed patient's old medical records in [place]  Chief Complaint: Right foot infection  HPI: Eric [name] is a 74 y.o. male with medical history significant of paroxysmal A. fib, hypertension, hypothyroidism, dementia, chronic combined systolic and diastolic congestive heart failure, hypertension brought in by EMS for the concern of right foot ulcer/infection.  Patient is poor historian.  He denies any pain, trauma, bleeding, discharge, fever, chills, weakness, lethargic, nausea, vomiting, decreased appetite, chest pain, headache, blurry vision.  He has chronic redness of both lower extremities.  He denies worsening, orthopnea, PND.  He said that his wound is managed by nurses at nursing home.  No history of smoking, alcohol, illicit drug use.  He tells me that he does not use oxygen at nursing home.  ED Course: Upon arrival to ED: Patient hypoxic in the 80s-placed on 3 L of oxygen via nasal cannula, patient afebrile, has leukocytosis of 18,000, blood pressure: 112/74.  BMP shows sodium of 133, blood culture, CRP, ESR, COVID-19, lactic acid: Pending.  X-ray of right foot shows: Osteopenia, no fracture or dislocation.  Soft tissue ulceration of the plantar heel without evidence of underlying bony erosion or sclerosis.  Consider MRI to evaluate for osteomyelitis.  Soft tissue edema about the foot and ankle.  He received IV Zosyn in the ED.  EDP consulted orthopedic surgery.  MRI ordered.  [facility] hospitalist consulted for admission for right foot cellulitis.  Review of Systems: As per HPI otherwise negative.    Past Medical History:  Diagnosis Date  . Acute respiratory failure with hypoxia ([place])    multiple prior admissions to [facility] and [facility] for CHF exacerbation / respiratory failure  . Anxiety and  depression   . Atrial fibrillation ([place]) 07/25/2018   new-onset during [place] admission in Jan 2020  . Cataract   . CHF (congestive heart failure) (HCC)    EF <= 50% as of Jan 2020  . Current use of long term anticoagulation 07/2018   Started on apixaban for new-onset a-fib in Jan 2020  . Dementia ([place])    possible, likely age-related, documented decreased cognitive function on prior hospitalizations  . Gallstones   . Glaucoma   . Hypertension   . Hypoglycemia   . Hypothyroidism   . Inguinal hernia   . Sleep apnea   . Thyroid disease     Past Surgical History:  Procedure Laterality Date  . FINGER AMPUTATION Right      reports that he quit smoking about 9 years ago. He has never used smokeless tobacco. He reports that he does not drink alcohol or use drugs.  Allergies  Allergen Reactions  . Demerol [Meperidine] Other (See Comments)    Will counteract with a drug he is taking causing fatal reaction with nardil  . Epinephrine Other (See Comments)    "Nardil reaction-With epi?" "Allergic," per MAR  . Nardil [Phenelzine] Other (See Comments)    "Allergic," per MAR  . Morphine And Related Other (See Comments)    Reacts with nardil- "Allergic," per MAR  . Ondansetron Other (See Comments)    Made patient want to climb the walls- "Allergic," per MAR  . Shellfish Allergy Hives    Family History  Problem Relation Age of Onset  . Alzheimer's disease Mother     Prior to Admission  medications   Medication Sig Start Date End Date Taking? Authorizing Provider  acetaminophen (TYLENOL) 325 MG tablet Take 2 tablets (650 mg total) by mouth every 6 (six) hours as needed for mild pain (or Fever >/= 101). Patient taking differently: Take 650 mg by mouth every 6 (six) hours as needed for mild pain or headache (fever >/= 101, or minor discomfort).  11/03/18  Yes Wieting, Richard, MD  albuterol (ACCUNEB) 0.63 MG/3ML nebulizer solution Take 1 ampule by nebulization every 6 (six) hours as needed  for shortness of breath.   Yes [provider]  alum & mag hydroxide-simeth (ANTACID) 200-200-20 MG/5ML suspension Take 30 mLs by mouth 4 (four) times daily as needed for indigestion or heartburn.   Yes [provider]  diazepam (VALIUM) 2 MG tablet Take 1-2 mg by mouth See admin instructions. Take 1 mg by mouth in the morning and 2 mg at bedtime   Yes [provider]  diphenhydrAMINE (DIPHENHIST) 25 MG tablet Take 25 mg by mouth every 6 (six) hours as needed (for allergic reactions).   Yes [provider]  furosemide (LASIX) 40 MG tablet Take 40 mg by mouth 2 (two) times daily.   Yes [provider]  guaiFENesin (ROBITUSSIN) 100 MG/5ML liquid Take 300 mg by mouth every 6 (six) hours as needed for cough.   Yes [provider]  levofloxacin (LEVAQUIN) 750 MG tablet Take 750 mg by mouth daily. 11/10/19 11/17/19 Yes [provider]  levothyroxine (SYNTHROID) 175 MCG tablet Take 175 mcg by mouth daily before breakfast.   Yes [provider]  liver oil-zinc oxide (DESITIN) 40 % ointment Apply topically daily. Patient taking differently: Apply 1 application topically See admin instructions. Apply to buttocks once daily 11/04/18  Yes Wieting, Richard, MD  loperamide (IMODIUM A-D) 2 MG tablet Take 4 mg by mouth every 3 (three) hours as needed for diarrhea or loose stools (CANNOT EXCEED 8 DOSES/24 HOURS).   Yes [provider]  magnesium hydroxide (MILK OF MAGNESIA) 400 MG/5ML suspension Take 30 mLs by mouth daily as needed for mild constipation.   Yes [provider]  metoCLOPramide (REGLAN) 5 MG tablet Take 5 mg by mouth every 6 (six) hours as needed for nausea or vomiting.   Yes [provider]  Multiple Vitamins-Minerals (THERATRUM COMPLETE) TABS Take 1 tablet by mouth daily with breakfast.   Yes [provider]  nystatin cream (MYCOSTATIN) Apply 1 application topically See admin instructions. Apply to  right lower leg 2 times a week   Yes [provider]  OLANZapine (ZYPREXA) 5 MG tablet Take 1 tablet (5 mg total) by mouth once as needed (agitation). Patient taking differently: Take 5 mg by mouth daily as needed (for agitation).  06/09/19  Yes Lord, Asa Saunas, NP  OLANZapine zydis (ZYPREXA) 10 MG disintegrating tablet Take 1 tablet (10 mg total) by mouth 2 (two) times daily. 11/03/18  Yes Wieting, Richard, MD  OXYGEN Inhale 2 L/min into the lungs See admin instructions. "2 L/min, as tolerated"   Yes [provider]  sertraline (ZOLOFT) 25 MG tablet Take 1 tablet (25 mg total) by mouth daily. 06/10/19  Yes Patrecia Pour, NP  Skin Protectants, Misc. (EUCERIN) cream Apply 1 application topically See admin instructions. Apply to both legs 2 times a day   Yes [provider]  diazepam (VALIUM) 10 MG tablet Take 1 tablet (10 mg total) by mouth every 6 (six) hours as needed for anxiety. Patient not taking: Reported  on 11/16/2019 11/03/18   Loletha Grayer, MD  furosemide (LASIX) 20 MG tablet Take 1 tablet (20 mg total) by mouth daily. Patient not taking: Reported on 11/16/2019 11/04/18   Loletha Grayer, MD  levothyroxine (SYNTHROID) 150 MCG tablet Take 1 tablet (150 mcg total) by mouth daily. Patient not taking: Reported on 11/16/2019 11/03/18 11/16/19  Loletha Grayer, MD  metoprolol succinate (TOPROL-XL) 25 MG 24 hr tablet Take 1 tablet (25 mg total) by mouth daily. Patient not taking: Reported on 11/16/2019 11/04/18   Loletha Grayer, MD  mupirocin ointment Drue Stager) 2 % Apply to affected area 3 times daily Patient not taking: Reported on 11/16/2019 07/22/19 07/21/20  Earleen Newport, MD  predniSONE (DELTASONE) 50 MG tablet Take 1 tablet by mouth daily Patient not taking: Reported on 11/16/2019 07/22/19   Earleen Newport, MD  sulfamethoxazole-trimethoprim (BACTRIM DS) 800-160 MG tablet Take 1 tablet by mouth 2 (two) times daily. Patient not taking: Reported on 11/16/2019  07/22/19   Earleen Newport, MD    Physical Exam: Vitals:   11/16/19 1500 11/16/19 1600 11/16/19 1630 11/16/19 1700  BP: (!) 109/59 100/71 (!) 101/51 112/63  Pulse: 82 83 81 62  Temp:      TempSrc:      SpO2: 95% 96% 95% 93%    Constitutional: NAD, calm, comfortable, on 3 L of oxygen via nasal cannula, Eyes: PERRL, lids and conjunctivae normal ENMT: Mucous membranes are moist. Posterior pharynx clear of any exudate or lesions.Normal dentition.  Neck: normal, supple, no masses, no thyromegaly Respiratory: clear to auscultation bilaterally, no wheezing, no crackles. Normal respiratory effort. No accessory muscle use.  Cardiovascular: Regular rate and rhythm, no murmurs / rubs / gallops. No extremity edema. 2+ pedal pulses. No carotid bruits.  Abdomen: no tenderness, no masses palpated. No hepatosplenomegaly. Bowel sounds positive.  Musculoskeletal:      Neurologic: CN 2-12 grossly intact. Sensation intact, DTR normal. Strength 5/5 in all 4.   Labs on Admission: I have personally reviewed following labs and imaging studies  CBC: Recent Labs  Lab 11/16/19 1517  WBC 18.7*  NEUTROABS 16.3*  HGB 14.5  HCT 43.5  MCV 91.8  PLT 761   Basic Metabolic Panel: Recent Labs  Lab 11/16/19 1517  NA 133*  K 4.6  CL 103  CO2 20*  GLUCOSE 124*  BUN 27*  CREATININE 1.15  CALCIUM 8.1*   GFR: CrCl cannot be calculated (Unknown ideal weight.). Liver Function Tests: No results for input(s): AST, ALT, ALKPHOS, BILITOT, PROT, ALBUMIN in the last 168 hours. No results for input(s): LIPASE, AMYLASE in the last 168 hours. No results for input(s): AMMONIA in the last 168 hours. Coagulation Profile: No results for input(s): INR, PROTIME in the last 168 hours. Cardiac Enzymes: No results for input(s): CKTOTAL, CKMB, CKMBINDEX, TROPONINI in the last 168 hours. BNP (last 3 results) No results for input(s): PROBNP in the last 8760 hours. HbA1C: No results for input(s): HGBA1C in the  last 72 hours. CBG: No results for input(s): GLUCAP in the last 168 hours. Lipid Profile: No results for input(s): CHOL, HDL, LDLCALC, TRIG, CHOLHDL, LDLDIRECT in the last 72 hours. Thyroid Function Tests: No results for input(s): TSH, T4TOTAL, FREET4, T3FREE, THYROIDAB in the last 72 hours. Anemia Panel: No results for input(s): VITAMINB12, FOLATE, FERRITIN, TIBC, IRON, RETICCTPCT in the last 72 hours. Urine analysis:    Component Value Date/Time   COLORURINE YELLOW 06/20/2019 1225   APPEARANCEUR CLEAR 06/20/2019 1225   LABSPEC 1.020 06/20/2019 1225  PHURINE 6.5 06/20/2019 1225   GLUCOSEU NEGATIVE 06/20/2019 1225   HGBUR TRACE (A) 06/20/2019 [place] 06/20/2019 [place] 06/20/2019 1225   PROTEINUR NEGATIVE 06/20/2019 1225   UROBILINOGEN 0.2 08/17/2011 2150   NITRITE NEGATIVE 06/20/2019 1225   LEUKOCYTESUR NEGATIVE 06/20/2019 1225    Radiological Exams on Admission: DG Foot 2 Views Right  Result Date: 11/16/2019 CLINICAL DATA:  Right heel ulceration, evaluate for evidence of osteomyelitis EXAM: RIGHT FOOT - 2 VIEW COMPARISON:  08/24/2013 FINDINGS: Osteopenia. No fracture or dislocation. Joint spaces are preserved. Soft tissue ulceration of the plantar heel without evidence of underlying bony erosion or sclerosis. Diffuse soft tissue edema about the foot and ankle. IMPRESSION: 1.  Osteopenia.  No fracture or dislocation of the right foot. 2. Soft tissue ulceration of the plantar heel without evidence of underlying bony erosion or sclerosis. Consider MRI to more sensitively evaluate for bone marrow edema and osteomyelitis if clinically suspected. 3.  Soft tissue edema about the foot and ankle. Electronically Signed   By: Eddie Candle M.D.   On: 11/16/2019 15:27    Assessment/Plan Principal Problem:   Cellulitis of right foot Active Problems:   Hypothyroidism   PAF (paroxysmal atrial fibrillation) (HCC)   HTN (hypertension)   Anxiety and  depression   Right foot ulcer (HCC)    Right foot ulcer/cellulitis: -Patient has chronic right foot ulcer.  Reviewed x-ray of right foot.  MRI is ordered and is pending.  Has leukocytosis of 18,000.  He is afebrile.  He is not tachycardic or tachypneic.  CRP, ESR, lactic acid, blood culture, COVID-19: Pending -EDP consulted orthopedic surgery-appreciate input -Received IV Zosyn in the ED -Admit patient on the floor. -Continue IV Zosyn, start vancomycin -Tylenol as needed for pain control. -Monitor vitals.  Acute hypoxemic respiratory failure: -Not sure if patient uses oxygen at SNF. -Currently he is requiring 3 L via nasal cannula.  He is not in respiratory distress.  COVID-19 is pending -We will get chest x-ray.  On continuous pulse ox.  We will try to wean off of oxygen as tolerated.  Hypothyroidism: Check TSH -Continue levothyroxine  Chronic combined systolic and diastolic congestive heart failure: Patient appears euvolemic on exam -Hold Lasix for now as patient's blood pressure is on lower side (lowest 89/56). -Strict INO's and daily weight.  Resume Lasix once blood pressure back to baseline.  Hyponatremia: Likely secondary to Lasix -Sodium 133. -Repeat BMP tomorrow a.m.  Dementia/depression/anxiety: Continue Zyprexa, Valium, Zoloft  Hypertension/paroxysmal A. Fib: -Reviewed home meds.  Not on rate control or anticoagulation. -Ordered EKG.   DVT prophylaxis: Lovenox/SCD/TED Code Status: Full code  family Communication: None present at bedside.  Plan of care discussed with patient in length and he verbalized understanding and agreed with it. Disposition Plan: TBD Consults called: Orthopedic surgery by EDP Admission status: Inpatient  Mckinley Jewel MD [facility]  If 7PM-7AM, please contact night-coverage [email or web address] Password [facility]  11/16/2019, 5:36 PM

## 2019-11-17 ENCOUNTER — Inpatient Hospital Stay (HOSPITAL_COMMUNITY): Payer: Medicare Other

## 2019-11-17 DIAGNOSIS — L03115 Cellulitis of right lower limb: Secondary | ICD-10-CM | POA: Diagnosis not present

## 2019-11-17 LAB — TSH: TSH: 27.01 u[IU]/mL — ABNORMAL HIGH (ref 0.350–4.500)

## 2019-11-17 LAB — C-REACTIVE PROTEIN: CRP: 2.5 mg/dL — ABNORMAL HIGH (ref <1.0)

## 2019-11-17 MED ORDER — HALOPERIDOL LACTATE 5 MG/ML IJ SOLN
2.0000 mg | Freq: Four times a day (QID) | INTRAMUSCULAR | Status: DC | PRN
  Administered 2019-11-17 – 2019-11-20 (×4): 2 mg via INTRAVENOUS
  Filled 2019-11-17 (×5): qty 1

## 2019-11-17 MED ORDER — LEVOTHYROXINE SODIUM 75 MCG PO TABS
175.0000 ug | ORAL_TABLET | Freq: Every day | ORAL | Status: DC
  Administered 2019-11-19 – 2019-11-20 (×2): 175 ug via ORAL
  Filled 2019-11-17 (×2): qty 1

## 2019-11-17 MED ORDER — DIAZEPAM 2 MG PO TABS
2.0000 mg | ORAL_TABLET | Freq: Every day | ORAL | Status: DC
  Administered 2019-11-17 – 2019-11-19 (×2): 2 mg via ORAL
  Filled 2019-11-17 (×4): qty 1

## 2019-11-17 MED ORDER — OLANZAPINE 5 MG PO TABS
5.0000 mg | ORAL_TABLET | Freq: Every day | ORAL | Status: DC | PRN
  Administered 2019-11-19 – 2019-11-21 (×2): 5 mg via ORAL
  Filled 2019-11-17 (×4): qty 1

## 2019-11-17 MED ORDER — DIAZEPAM 2 MG PO TABS
1.0000 mg | ORAL_TABLET | Freq: Every day | ORAL | Status: DC
  Administered 2019-11-18 – 2019-11-21 (×3): 1 mg via ORAL
  Filled 2019-11-17 (×4): qty 1

## 2019-11-17 MED ORDER — OLANZAPINE 5 MG PO TBDP
10.0000 mg | ORAL_TABLET | Freq: Two times a day (BID) | ORAL | Status: DC
  Administered 2019-11-17 – 2019-11-21 (×6): 10 mg via ORAL
  Filled 2019-11-17 (×11): qty 2

## 2019-11-17 MED ORDER — LORAZEPAM 2 MG/ML IJ SOLN
1.0000 mg | Freq: Once | INTRAMUSCULAR | Status: AC
  Administered 2019-11-17: 1 mg via INTRAMUSCULAR
  Filled 2019-11-17: qty 1

## 2019-11-17 MED ORDER — HALOPERIDOL LACTATE 5 MG/ML IJ SOLN
5.0000 mg | Freq: Once | INTRAMUSCULAR | Status: AC
  Administered 2019-11-17: 5 mg via INTRAMUSCULAR
  Filled 2019-11-17: qty 1

## 2019-11-17 NOTE — ED Notes (Signed)
Pt is sitting very near the edge of the bed, presenting a fall risk. Pt is encouraged to lay back while staff helps him. Pt responded by cussing, calling this EMT, Georgiann Hahn, RN and Lanora Manis, RN "a bunch of b*tches" and then swung his fists at both this EMT and Lanora Manis, Charity fundraiser while staff tried to assist him back to bed.

## 2019-11-17 NOTE — Progress Notes (Addendum)
PROGRESS NOTE    Eric [name]  KLK:[id number] DOB: 24-Jul-1945 DOA: 11/16/2019 PCP: System, Pcp Not In   Brief Narrative:  Per HPI: Eric [name] is a 74 y.o. male with medical history significant of paroxysmal A. fib, hypertension, hypothyroidism, dementia, chronic combined systolic and diastolic congestive heart failure, hypertension brought in by EMS for the concern of right foot ulcer/infection.  Patient is poor historian.  He denies any pain, trauma, bleeding, discharge, fever, chills, weakness, lethargic, nausea, vomiting, decreased appetite, chest pain, headache, blurry vision.  He has chronic redness of both lower extremities.  He denies worsening, orthopnea, PND.  He said that his wound is managed by nurses at nursing home.  No history of smoking, alcohol, illicit drug use.  He tells me that he does not use oxygen at nursing home.  5/1: Patient was admitted with right foot ulcer/cellulitis and suspected osteomyelitis.  ABI as well as MRI of the foot is still pending.  Appreciate orthopedic surgery input.  Continue on vancomycin and Zosyn with no growth on cultures noted as of yet.  Assessment & Plan:   Principal Problem:   Cellulitis of right foot Active Problems:   Hypothyroidism   PAF (paroxysmal atrial fibrillation) (HCC)   HTN (hypertension)   Anxiety and depression   Right foot ulcer (HCC)   Right foot ulcer/cellulitis -Patient with chronic ulcer and ongoing leukocytosis noted -MRI as well as ABI pending -Appreciate orthopedic evaluation -Continue Zosyn and vancomycin -Monitor ESR/CRP  Acute hypoxemic respiratory failure -Patient has from SNF and it is uncertain if he uses oxygen at that facility -Chest x-ray with no acute findings  Hypothyroidism -Continue levothyroxine -Check TSH  Chronic combined systolic and diastolic CHF -Currently appears euvolemic -Soft blood pressures and will hold Lasix for now  Hyponatremia -Repeat a.m. labs   Dementia/depression/anxiety -Continue Zyprexa, Valium, and Zoloft  Hypertension/paroxysmal A. Fib -Not on rate control medications or anticoagulation -Last EKG 4/30 with sinus rhythm noted  DVT prophylaxis: Lovenox Code Status: Full Family Communication: None at bedside. Disposition Plan: Per orthopedic surgery evaluation with further studies of MRI and ABI still pending.   Consultants:   Orthopedic surgery  Procedures:   None  Antimicrobials:  Anti-infectives (From admission, onward)   Start     Dose/Rate Route Frequency Ordered Stop   11/17/19 0600  vancomycin (VANCOREADY) IVPB 750 mg/150 mL     750 mg 150 mL/hr over 60 Minutes Intravenous Every 12 hours 11/16/19 1742     11/17/19 0000  piperacillin-tazobactam (ZOSYN) IVPB 3.375 g     3.375 g 12.5 mL/hr over 240 Minutes Intravenous Every 8 hours 11/16/19 1742     11/16/19 1800  vancomycin (VANCOREADY) IVPB 2000 mg/400 mL     2,000 mg 200 mL/hr over 120 Minutes Intravenous  Once 11/16/19 1737 11/17/19 0041   11/16/19 1745  piperacillin-tazobactam (ZOSYN) IVPB 3.375 g  Status:  Discontinued     3.375 g 100 mL/hr over 30 Minutes Intravenous  Once 11/16/19 1732 11/16/19 1732   11/16/19 1745  vancomycin (VANCOCIN) IVPB 1000 mg/200 mL premix  Status:  Discontinued     1,000 mg 200 mL/hr over 60 Minutes Intravenous  Once 11/16/19 1732 11/16/19 1732   11/16/19 1630  piperacillin-tazobactam (ZOSYN) IVPB 3.375 g     3.375 g 100 mL/hr over 30 Minutes Intravenous  Once 11/16/19 1629 11/16/19 1754       Subjective: Patient seen and evaluated today with no new acute complaints or concerns. No acute concerns or events  noted overnight.  Objective: Vitals:   11/17/19 0900 11/17/19 0930 11/17/19 1000 11/17/19 1100  BP: 100/63 106/76 106/66 (!) 97/58  Pulse: 82 81 64 75  Resp: '13 12 12 13  '$ Temp:      TempSrc:      SpO2: 94% 93% 94% 94%   No intake or output data in the 24 hours ending 11/17/19 1112 There were no vitals  filed for this visit.  Examination:  General exam: Poorly responsive, but alert Respiratory system: Clear to auscultation. Respiratory effort normal.  Currently on 2 L nasal cannula Cardiovascular system: S1 & S2 heard, RRR. No JVD, murmurs, rubs, gallops or clicks. No pedal edema. Gastrointestinal system: Abdomen is nondistended, soft and nontender. No organomegaly or masses felt. Normal bowel sounds heard. Central nervous system: Alert and oriented. No focal neurological deficits. Extremities: Symmetric 5 x 5 power.  Right heel decubitus ulcers noted. Skin: No rashes, lesions or ulcers Psychiatry: Judgement and insight appear normal. Mood & affect appropriate.     Data Reviewed: I have personally reviewed following labs and imaging studies  CBC: Recent Labs  Lab 11/16/19 1517 11/16/19 1733  WBC 18.7* 16.8*  NEUTROABS 16.3*  --   HGB 14.5 13.6  HCT 43.5 42.0  MCV 91.8 94.4  PLT 271 628   Basic Metabolic Panel: Recent Labs  Lab 11/16/19 1517 11/16/19 1733  NA 133*  --   K 4.6  --   CL 103  --   CO2 20*  --   GLUCOSE 124*  --   BUN 27*  --   CREATININE 1.15 1.24  CALCIUM 8.1*  --   MG  --  2.3   GFR: CrCl cannot be calculated (Unknown ideal weight.). Liver Function Tests: No results for input(s): AST, ALT, ALKPHOS, BILITOT, PROT, ALBUMIN in the last 168 hours. No results for input(s): LIPASE, AMYLASE in the last 168 hours. No results for input(s): AMMONIA in the last 168 hours. Coagulation Profile: No results for input(s): INR, PROTIME in the last 168 hours. Cardiac Enzymes: No results for input(s): CKTOTAL, CKMB, CKMBINDEX, TROPONINI in the last 168 hours. BNP (last 3 results) No results for input(s): PROBNP in the last 8760 hours. HbA1C: No results for input(s): HGBA1C in the last 72 hours. CBG: No results for input(s): GLUCAP in the last 168 hours. Lipid Profile: No results for input(s): CHOL, HDL, LDLCALC, TRIG, CHOLHDL, LDLDIRECT in the last 72 hours.  Thyroid Function Tests: No results for input(s): TSH, T4TOTAL, FREET4, T3FREE, THYROIDAB in the last 72 hours. Anemia Panel: No results for input(s): VITAMINB12, FOLATE, FERRITIN, TIBC, IRON, RETICCTPCT in the last 72 hours. Sepsis Labs: Recent Labs  Lab 11/16/19 1800  LATICACIDVEN 1.6    Recent Results (from the past 240 hour(s))  Blood culture (routine x 2)     Status: None (Preliminary result)   Collection Time: 11/16/19  4:45 PM   Specimen: BLOOD LEFT FOREARM  Result Value Ref Range Status   Specimen Description BLOOD LEFT FOREARM  Final   Special Requests   Final    BOTTLES DRAWN AEROBIC AND ANAEROBIC Blood Culture results may not be optimal due to an inadequate volume of blood received in culture bottles   Culture   Final    NO GROWTH < 24 HOURS Performed at [facility], [place] [address], [place], [place] [address]    Report Status PENDING  Incomplete  Blood culture (routine x 2)     Status: None (Preliminary result)  Collection Time: 11/16/19  4:45 PM   Specimen: BLOOD  Result Value Ref Range Status   Specimen Description BLOOD LEFT ANTECUBITAL  Final   Special Requests   Final    BOTTLES DRAWN AEROBIC ONLY Blood Culture results may not be optimal due to an inadequate volume of blood received in culture bottles   Culture   Final    NO GROWTH < 24 HOURS Performed at [facility], [address], [place], [place] [address]    Report Status PENDING  Incomplete  Respiratory Panel by RT PCR (Flu A&B, Covid) - Nasopharyngeal Swab     Status: Abnormal   Collection Time: 11/16/19  6:32 PM   Specimen: Nasopharyngeal Swab  Result Value Ref Range Status   SARS Coronavirus 2 by RT PCR POSITIVE (A) NEGATIVE Final    Comment: RESULT CALLED TO, READ BACK BY AND VERIFIED WITH: Chalmers Cater RN 11/16/19 1955 JDW (NOTE) SARS-CoV-2 target nucleic acids are DETECTED. SARS-CoV-2 RNA is generally detectable in upper respiratory specimens  during the acute phase of infection.  Positive results are indicative of the presence of the identified virus, but do not rule out bacterial infection or co-infection with other pathogens not detected by the test. Clinical correlation with patient history and other diagnostic information is necessary to determine patient infection status. The expected result is Negative. Fact Sheet for Patients:  PinkCheek.be Fact Sheet for Healthcare Providers: GravelBags.it This test is not yet approved or cleared by the [place] FDA and  has been authorized for detection and/or diagnosis of SARS-CoV-2 by FDA under an Emergency Use Authorization (EUA).  This EUA will remain in effect (meaning this test can be used) for the  duration of  the COVID-19 declaration under Section 564(b)(1) of the Act, 21 U.S.C. section 360bbb-3(b)(1), unless the authorization is terminated or revoked sooner.    Influenza A by PCR NEGATIVE NEGATIVE Final   Influenza B by PCR NEGATIVE NEGATIVE Final    Comment: (NOTE) The Xpert Xpress SARS-CoV-2/FLU/RSV assay is intended as an aid in  the diagnosis of influenza from Nasopharyngeal swab specimens and  should not be used as a sole basis for treatment. Nasal washings and  aspirates are unacceptable for Xpert Xpress SARS-CoV-2/FLU/RSV  testing. Fact Sheet for Patients: PinkCheek.be Fact Sheet for Healthcare Providers: GravelBags.it This test is not yet approved or cleared by the [place] FDA and  has been authorized for detection and/or diagnosis of SARS-CoV-2 by  FDA under an Emergency Use Authorization (EUA). This EUA will remain  in effect (meaning this test can be used) for the duration of the  Covid-19 declaration under Section 564(b)(1) of the Act, 21  U.S.C. section 360bbb-3(b)(1), unless the authorization is  terminated or revoked. Performed at [facility], [place]  [address] [place], [place] [address]   Wound or Superficial Culture     Status: None (Preliminary result)   Collection Time: 11/16/19  8:46 PM   Specimen: Heel; Wound  Result Value Ref Range Status   Specimen Description HEEL  Final   Special Requests NONE  Final   Gram Stain   Final    NO WBC SEEN MODERATE GRAM POSITIVE RODS FEW GRAM POSITIVE COCCI Performed at [facility], [address], [place], [place] [address]    Culture PENDING  Incomplete   Report Status PENDING  Incomplete         Radiology Studies: DG Orbits  Result Date: 2019-12-06 CLINICAL DATA:  MRI screening.  EXAM: ORBITS - COMPLETE 4+ VIEW COMPARISON:  None. FINDINGS: There is no evidence of fracture or other significant bone abnormality. A 1.03 mm calcified opacity is seen within the soft tissues just above the region of the zygomatic arch on the right. No orbital emphysema or sinus air-fluid levels are seen. No radiopaque foreign bodies are identified. IMPRESSION: No evidence of an acute abnormality or radiopaque foreign body. Electronically Signed   By: Virgina Norfolk M.D.   On: 11/17/2019 03:55   DG Abd 1 View  Result Date: 11/17/2019 CLINICAL DATA:  MRI screening. EXAM: ABDOMEN - 1 VIEW COMPARISON:  None. FINDINGS: The bowel gas pattern is normal. A large amount of stool is seen within the ascending colon. No radio-opaque calculi or other significant radiographic abnormality are seen. No radiopaque foreign bodies are identified. IMPRESSION: 1. No evidence of radiopaque foreign body. 2. Large stool burden. Electronically Signed   By: Virgina Norfolk M.D.   On: 11/17/2019 03:57   DG CHEST PORT 1 VIEW  Result Date: 11/17/2019 CLINICAL DATA:  Right foot infection. EXAM: PORTABLE CHEST 1 VIEW COMPARISON:  July 22, 2019 FINDINGS: Very mild, predominant stable scarring and/or atelectasis is seen within the bilateral lung bases. The heart size and mediastinal contours are within normal limits. The visualized  skeletal structures are unremarkable. IMPRESSION: Very mild, predominant stable scarring and/or atelectasis in the bilateral lung bases. Electronically Signed   By: Virgina Norfolk M.D.   On: 11/17/2019 03:51   DG Foot 2 Views Right  Result Date: 11/16/2019 CLINICAL DATA:  Right heel ulceration, evaluate for evidence of osteomyelitis EXAM: RIGHT FOOT - 2 VIEW COMPARISON:  08/24/2013 FINDINGS: Osteopenia. No fracture or dislocation. Joint spaces are preserved. Soft tissue ulceration of the plantar heel without evidence of underlying bony erosion or sclerosis. Diffuse soft tissue edema about the foot and ankle. IMPRESSION: 1.  Osteopenia.  No fracture or dislocation of the right foot. 2. Soft tissue ulceration of the plantar heel without evidence of underlying bony erosion or sclerosis. Consider MRI to more sensitively evaluate for bone marrow edema and osteomyelitis if clinically suspected. 3.  Soft tissue edema about the foot and ankle. Electronically Signed   By: Eddie Candle M.D.   On: 11/16/2019 15:27        Scheduled Meds: . diazepam  1-2 mg Oral See admin instructions  . enoxaparin (LOVENOX) injection  40 mg Subcutaneous Q24H  . levothyroxine  175 mcg Oral QAC breakfast  . OLANZapine zydis  10 mg Oral BID  . sertraline  25 mg Oral Daily   Continuous Infusions: . piperacillin-tazobactam (ZOSYN)  IV 3.375 g (11/17/19 0758)  . vancomycin Stopped (11/17/19 2233)     LOS: 1 day    Time spent: 40 minutes    Pratik D Manuella Ghazi, DO [facility]  If 7PM-7AM, please contact night-coverage [email or web address] 11/17/2019, 11:12 AM

## 2019-11-17 NOTE — ED Notes (Signed)
Pt noted to be using profanity frequently, yelling at staff and anyone else who walks by the room. Pt remains naked, refusing to cooperate and follow directions from multiple staff members. Pt remains seated on the end of his bed and detached from cardiac monitoring but pulse ox remains connected. Multiple RNs at bedside, Charge RN aware of pt behavior. Security also aware of pts violent language and threats of violence towards staff.

## 2019-11-17 NOTE — ED Notes (Signed)
Pt. Very agitated and non- compliant. Pt. Not following commands and will not be able to answer safety questions for MRI. Pt. Is currently in soft wrist restraints. Pt. Will unable to go to MRI at this time.

## 2019-11-17 NOTE — ED Notes (Signed)
Two-point soft restraints removed by RN. Pt told RN and EMT "leave me alone" but took medications when told restraints could stay removed if pt was compliant with treatment and did not disconnect lines or equipment. Pt was fed by EMT. Toileting offered. IV in left arm reinforced with bandage to discourage pt from pulling at lines. Pt remains calm and sleepy at this time.

## 2019-11-17 NOTE — Progress Notes (Addendum)
Patient ID: Eric [name], male   DOB: 1946/04/20, 74 y.o.   MRN: [id number] Patient is seen for initial evaluation for a right heel decubitus ulcer.  Treatment will depend on ABI results as well as the MRI results.  I will follow-up and leave a full note after the ABI and MRI scan is finalized.  Patient on Vanco and Zosyn.  N.p.o. canceled until studies finalized.  I will reevaluate on Monday morning.

## 2019-11-17 NOTE — ED Notes (Signed)
This RN paged the On-Call provider regarding the need from MRI to have a XRAY Orbits and XRAY ABD to have MRI completed; awaiting further orders.

## 2019-11-17 NOTE — ED Notes (Signed)
Lunch Tray Ordered 1115. 

## 2019-11-18 ENCOUNTER — Inpatient Hospital Stay (HOSPITAL_COMMUNITY): Payer: Medicare Other

## 2019-11-18 DIAGNOSIS — L0390 Cellulitis, unspecified: Secondary | ICD-10-CM | POA: Diagnosis not present

## 2019-11-18 DIAGNOSIS — I480 Paroxysmal atrial fibrillation: Secondary | ICD-10-CM | POA: Diagnosis not present

## 2019-11-18 DIAGNOSIS — L03115 Cellulitis of right lower limb: Secondary | ICD-10-CM | POA: Diagnosis not present

## 2019-11-18 DIAGNOSIS — I96 Gangrene, not elsewhere classified: Secondary | ICD-10-CM

## 2019-11-18 DIAGNOSIS — E039 Hypothyroidism, unspecified: Secondary | ICD-10-CM

## 2019-11-18 DIAGNOSIS — F419 Anxiety disorder, unspecified: Secondary | ICD-10-CM | POA: Diagnosis not present

## 2019-11-18 DIAGNOSIS — F329 Major depressive disorder, single episode, unspecified: Secondary | ICD-10-CM

## 2019-11-18 LAB — COMPREHENSIVE METABOLIC PANEL
ALT: 15 U/L (ref 0–44)
AST: 17 U/L (ref 15–41)
Albumin: 2.2 g/dL — ABNORMAL LOW (ref 3.5–5.0)
Alkaline Phosphatase: 51 U/L (ref 38–126)
Anion gap: 8 (ref 5–15)
BUN: 18 mg/dL (ref 8–23)
CO2: 26 mmol/L (ref 22–32)
Calcium: 7.9 mg/dL — ABNORMAL LOW (ref 8.9–10.3)
Chloride: 105 mmol/L (ref 98–111)
Creatinine, Ser: 1.03 mg/dL (ref 0.61–1.24)
GFR calc Af Amer: >60 mL/min (ref >60)
GFR calc non Af Amer: >60 mL/min (ref >60)
Glucose, Bld: 134 mg/dL — ABNORMAL HIGH (ref 70–99)
Potassium: 3.8 mmol/L (ref 3.5–5.1)
Sodium: 139 mmol/L (ref 135–145)
Total Bilirubin: 0.3 mg/dL (ref 0.3–1.2)
Total Protein: 5.6 g/dL — ABNORMAL LOW (ref 6.5–8.1)

## 2019-11-18 LAB — C-REACTIVE PROTEIN: CRP: 2.6 mg/dL — ABNORMAL HIGH (ref <1.0)

## 2019-11-18 LAB — CBC
HCT: 38.7 % — ABNORMAL LOW (ref 39.0–52.0)
Hemoglobin: 12.5 g/dL — ABNORMAL LOW (ref 13.0–17.0)
MCH: 30 pg (ref 26.0–34.0)
MCHC: 32.3 g/dL (ref 30.0–36.0)
MCV: 93 fL (ref 80.0–100.0)
Platelets: 203 10*3/uL (ref 150–400)
RBC: 4.16 MIL/uL — ABNORMAL LOW (ref 4.22–5.81)
RDW: 13.8 % (ref 11.5–15.5)
WBC: 9.6 10*3/uL (ref 4.0–10.5)
nRBC: 0 % (ref 0.0–0.2)

## 2019-11-18 LAB — SEDIMENTATION RATE: Sed Rate: 45 mm/hr — ABNORMAL HIGH (ref 0–16)

## 2019-11-18 MED ORDER — LORAZEPAM 2 MG/ML IJ SOLN
1.0000 mg | Freq: Once | INTRAMUSCULAR | Status: AC | PRN
  Administered 2019-11-18: 1 mg via INTRAVENOUS
  Filled 2019-11-18: qty 1

## 2019-11-18 NOTE — Progress Notes (Signed)
PT Cancellation Note  Patient Details Name: Eric [name] MRN: [id number] DOB: 07/26/1945   Cancelled Treatment:    Reason Eval/Treat Not Completed: Patient at procedure or test/unavailable   Will reattempt;   Van Clines, PT  Acute Rehabilitation Services Pager [phone] Office [phone]    Eric [name] 11/18/2019, 3:28 PM

## 2019-11-18 NOTE — Progress Notes (Signed)
PROGRESS NOTE                                                                                                                                                                                                             Patient Demographics:    Eric [name], is a 74 y.o. male, DOB - Nov 24, 1945, ZOX:[id number]  Outpatient Primary MD for the patient is System, Pcp Not In   Admit date - 11/16/2019   LOS - 2  Chief Complaint  Patient presents with  . Wound Infection       Brief Narrative: Patient is a 74 y.o. male with PMHx of PAF, HTN, hypothyroidism, chronic combined systolic/diastolic heart failure, dementia-who was brought from SNF for concern of right foot/heel ulcer with infection.  Significant Events: 4/30>> [facility] from SNF-with right foot cellulitis/wound infection.  COVID-19 medications: None  Antibiotics: Vancomycin: 4/30>> Zosyn: 4/30>>  Microbiology data: 4/30: Blood culture>> negative so far 4/30: Right heel wound swab>> no growth so far   DVT prophylaxis: SQ Lovenox  Procedures: None  Consults: Ortho    Subjective:    Eric [name] today is relatively awake and alert-answers most of my questions appropriately.   Assessment  & Plan :   Right leg soft tissue infection/cellulitis with heel ulceration: Erythema/swelling improved-concern for underlying osteomyelitis-unfortunately patient not cooperative for MRI-hence will switch to CT scan, await ABIs.  In the meantime-continue empiric IV antibiotics-and follow cultures.  Will await further evaluation from orthopedics.  Chronic combined systolic and diastolic heart failure: Euvolemic-no echocardiogram on file.  BP appears to be soft-continue to hold diuretics.  ?  Chronic hypoxic respiratory failure: SNF medication lists O2 2 L/min as needed-suspect that he probably has some amount of chronic hypoxic respiratory failure from CHF.  He does not  have cough or shortness of breath-to suggest a acute component to chronic hypoxic respiratory failure.  HTN: Blood pressure soft-continue to hold all antihypertensives for now.  History of PAF: Sinus rhythm-suspect not on anticoagulation given advanced dementia/poor candidate.   Hypothyroidism: TSH elevated at 27.0-not sure when dosage of levothyroxine adjusted last-we will go ahead and increase levothyroxine to 200 mcg-repeat TSH in a few weeks.  Dementia with delirium: Somewhat confused last night-at risk for worsening delirium-continue olanzapine, Zoloft, and Valium.  COVID-19 infection: Appears asymptomatic-no respiratory symptoms at all-CRP elevated likely secondary  to right heel ulceration.  Chest x-ray without pneumonia.  Looks like he may be on home O2.  Continue to monitor for now-tried reaching family to see what his vaccination status was-we will reach out to SNF via social worker tomorrow.   Fever: afebrile  O2 requirements:  SpO2: 99 % O2 Flow Rate (L/min): 2 L/min   COVID-19 Labs: Recent Labs    11/16/19 1633 11/17/19 1210 11/18/19 0538  CRP 1.1* 2.5* 2.6*       Component Value Date/Time   BNP 21.0 06/20/2019 1500    No results for input(s): PROCALCITON in the last 168 hours.  Lab Results  Component Value Date   SARSCOV2NAA POSITIVE (A) 11/16/2019   SARSCOV2NAA NEGATIVE 01/31/2019   SARSCOV2NAA NEGATIVE 01/18/2019   SARSCOV2NAA NEGATIVE 11/15/2018      Obesity: Estimated body mass index is 29.98 kg/m as calculated from the following:   Height as of 07/22/19: 5\' 9"  (1.753 m).   Weight as of this encounter: 92.1 kg.   RN pressure injury documentation: Pressure Injury 11/18/18 Stage II -  Partial thickness loss of dermis presenting as a shallow open ulcer with a red, pink wound bed without slough. (Active)  11/18/18 0956  Location: Buttocks  Location Orientation: Right;Lower  Staging: Stage II -  Partial thickness loss of dermis presenting as a shallow  open ulcer with a red, pink wound bed without slough.  Wound Description (Comments):   Present on Admission:     ABG: No results found for: PHART, PCO2ART, PO2ART, HCO3, TCO2, ACIDBASEDEF, O2SAT  Vent Settings: N/  Condition - Stable  Family Communication  : Called son-unable to leave voicemail-5/2.   Code Status :  Full Code  Diet :  Diet Order            Diet regular Room service appropriate? Yes; Fluid consistency: Thin  Diet effective now               Disposition Plan  :   Status is: Inpatient  Remains inpatient appropriate because:Inpatient level of care appropriate due to severity of illness   Dispo: The patient is from: SNF              Anticipated d/c is to: SNF              Anticipated d/c date is: > 3 days              Patient currently is not medically stable to d/c.   Barriers to discharge: Right heel soft tissue infection with ulceration-requiring IV antimicrobial therapy.  Antimicorbials  :    Anti-infectives (From admission, onward)   Start     Dose/Rate Route Frequency Ordered Stop   11/17/19 0600  vancomycin (VANCOREADY) IVPB 750 mg/150 mL     750 mg 150 mL/hr over 60 Minutes Intravenous Every 12 hours 11/16/19 1742     11/17/19 0000  piperacillin-tazobactam (ZOSYN) IVPB 3.375 g     3.375 g 12.5 mL/hr over 240 Minutes Intravenous Every 8 hours 11/16/19 1742     11/16/19 1800  vancomycin (VANCOREADY) IVPB 2000 mg/400 mL     2,000 mg 200 mL/hr over 120 Minutes Intravenous  Once 11/16/19 1737 11/17/19 0041   11/16/19 1745  piperacillin-tazobactam (ZOSYN) IVPB 3.375 g  Status:  Discontinued     3.375 g 100 mL/hr over 30 Minutes Intravenous  Once 11/16/19 1732 11/16/19 1732   11/16/19 1745  vancomycin (VANCOCIN) IVPB 1000 mg/200 mL premix  Status:  Discontinued  1,000 mg 200 mL/hr over 60 Minutes Intravenous  Once 11/16/19 1732 11/16/19 1732   11/16/19 1630  piperacillin-tazobactam (ZOSYN) IVPB 3.375 g     3.375 g 100 mL/hr over 30 Minutes  Intravenous  Once 11/16/19 1629 11/16/19 1754      Inpatient Medications  Scheduled Meds: . diazepam  1 mg Oral Daily  . diazepam  2 mg Oral QHS  . enoxaparin (LOVENOX) injection  40 mg Subcutaneous Q24H  . levothyroxine  175 mcg Oral QAC breakfast  . OLANZapine zydis  10 mg Oral BID  . sertraline  25 mg Oral Daily   Continuous Infusions: . piperacillin-tazobactam (ZOSYN)  IV 3.375 g (11/18/19 0809)  . vancomycin 750 mg (11/18/19 1032)   PRN Meds:.acetaminophen **OR** acetaminophen, albuterol, haloperidol lactate, OLANZapine   Time Spent in minutes  25  See all Orders from today for further details   Oren Binet M.D on 11/18/2019 at 1:25 PM  To page go to [email or web address] - use universal password  [facility] -  Office  [phone]    Objective:   Vitals:   11/17/19 1643 11/17/19 2000 11/18/19 0000 11/18/19 0530  BP: (!) 102/48 (!) 91/48 106/66 (!) 94/57  Pulse: 63 73 82 78  Resp: 15 13 19 14   Temp: (!) 97.4 F (36.3 C) 97.7 F (36.5 C) 97.7 F (36.5 C) 98.9 F (37.2 C)  TempSrc: Axillary Axillary Oral Axillary  SpO2: 94% 95% 93% 99%  Weight:    92.1 kg    Wt Readings from Last 3 Encounters:  11/18/19 92.1 kg  07/22/19 104.3 kg  06/20/19 90.7 kg     Intake/Output Summary (Last 24 hours) at 11/18/2019 1325 Last data filed at 11/18/2019 0532 Gross per 24 hour  Intake 771.79 ml  Output --  Net 771.79 ml     Physical Exam Gen Exam: Mildly confused but mostly awake and alert-not in any distress.   HEENT:atraumatic, normocephalic Chest: B/L clear to auscultation anteriorly CVS:S1S2 regular Abdomen:soft non tender, non distended Extremities:no edema Neurology: Difficult exam given some confusion but seems to be moving all 4 extremities. Skin: no rash   Data Review:    CBC Recent Labs  Lab 11/16/19 1517 11/16/19 1733 11/18/19 0538  WBC 18.7* 16.8* 9.6  HGB 14.5 13.6 12.5*  HCT 43.5 42.0 38.7*  PLT 271 239 203  MCV 91.8 94.4 93.0  MCH  30.6 30.6 30.0  MCHC 33.3 32.4 32.3  RDW 13.6 13.5 13.8  LYMPHSABS 0.5*  --   --   MONOABS 1.4*  --   --   EOSABS 0.1  --   --   BASOSABS 0.1  --   --     Chemistries  Recent Labs  Lab 11/16/19 1517 11/16/19 1733 11/18/19 0538  NA 133*  --  139  K 4.6  --  3.8  CL 103  --  105  CO2 20*  --  26  GLUCOSE 124*  --  134*  BUN 27*  --  18  CREATININE 1.15 1.24 1.03  CALCIUM 8.1*  --  7.9*  MG  --  2.3  --   AST  --   --  17  ALT  --   --  15  ALKPHOS  --   --  51  BILITOT  --   --  0.3   ------------------------------------------------------------------------------------------------------------------ No results for input(s): CHOL, HDL, LDLCALC, TRIG, CHOLHDL, LDLDIRECT in the last 72 hours.  No results found for: HGBA1C ------------------------------------------------------------------------------------------------------------------ Recent Labs  11/17/19 1210  TSH 27.010*   ------------------------------------------------------------------------------------------------------------------ No results for input(s): VITAMINB12, FOLATE, FERRITIN, TIBC, IRON, RETICCTPCT in the last 72 hours.  Coagulation profile No results for input(s): INR, PROTIME in the last 168 hours.  No results for input(s): DDIMER in the last 72 hours.  Cardiac Enzymes No results for input(s): CKMB, TROPONINI, MYOGLOBIN in the last 168 hours.  Invalid input(s): CK ------------------------------------------------------------------------------------------------------------------    Component Value Date/Time   BNP 21.0 06/20/2019 1500    Micro Results Recent Results (from the past 240 hour(s))  Blood culture (routine x 2)     Status: None (Preliminary result)   Collection Time: 11/16/19  4:45 PM   Specimen: BLOOD LEFT FOREARM  Result Value Ref Range Status   Specimen Description BLOOD LEFT FOREARM  Final   Special Requests   Final    BOTTLES DRAWN AEROBIC AND ANAEROBIC Blood Culture results  may not be optimal due to an inadequate volume of blood received in culture bottles   Culture   Final    NO GROWTH 2 DAYS Performed at [facility], [address], [place], [place] [address]    Report Status PENDING  Incomplete  Blood culture (routine x 2)     Status: None (Preliminary result)   Collection Time: 11/16/19  4:45 PM   Specimen: BLOOD  Result Value Ref Range Status   Specimen Description BLOOD LEFT ANTECUBITAL  Final   Special Requests   Final    BOTTLES DRAWN AEROBIC ONLY Blood Culture results may not be optimal due to an inadequate volume of blood received in culture bottles   Culture   Final    NO GROWTH 2 DAYS Performed at [facility], [address] [place], [place] [address]    Report Status PENDING  Incomplete  Respiratory Panel by RT PCR (Flu A&B, Covid) - Nasopharyngeal Swab     Status: Abnormal   Collection Time: 11/16/19  6:32 PM   Specimen: Nasopharyngeal Swab  Result Value Ref Range Status   SARS Coronavirus 2 by RT PCR POSITIVE (A) NEGATIVE Final    Comment: RESULT CALLED TO, READ BACK BY AND VERIFIED WITH: Mayer Camel RN 11/16/19 1955 JDW (NOTE) SARS-CoV-2 target nucleic acids are DETECTED. SARS-CoV-2 RNA is generally detectable in upper respiratory specimens  during the acute phase of infection. Positive results are indicative of the presence of the identified virus, but do not rule out bacterial infection or co-infection with other pathogens not detected by the test. Clinical correlation with patient history and other diagnostic information is necessary to determine patient infection status. The expected result is Negative. Fact Sheet for Patients:  https://www.moore.com/ Fact Sheet for Healthcare Providers: https://www.young.biz/ This test is not yet approved or cleared by the [place] FDA and  has been authorized for detection and/or diagnosis of SARS-CoV-2 by FDA under an Emergency Use  Authorization (EUA).  This EUA will remain in effect (meaning this test can be used) for the  duration of  the COVID-19 declaration under Section 564(b)(1) of the Act, 21 U.S.C. section 360bbb-3(b)(1), unless the authorization is terminated or revoked sooner.    Influenza A by PCR NEGATIVE NEGATIVE Final   Influenza B by PCR NEGATIVE NEGATIVE Final    Comment: (NOTE) The Xpert Xpress SARS-CoV-2/FLU/RSV assay is intended as an aid in  the diagnosis of influenza from Nasopharyngeal swab specimens and  should not be used as a sole basis for treatment. Nasal washings and  aspirates are unacceptable for Xpert  Xpress SARS-CoV-2/FLU/RSV  testing. Fact Sheet for Patients: https://www.moore.com/ Fact Sheet for Healthcare Providers: https://www.young.biz/ This test is not yet approved or cleared by the [place] FDA and  has been authorized for detection and/or diagnosis of SARS-CoV-2 by  FDA under an Emergency Use Authorization (EUA). This EUA will remain  in effect (meaning this test can be used) for the duration of the  Covid-19 declaration under Section 564(b)(1) of the Act, 21  U.S.C. section 360bbb-3(b)(1), unless the authorization is  terminated or revoked. Performed at [facility], [address] [place], [place] [address]   Wound or Superficial Culture     Status: None (Preliminary result)   Collection Time: 11/16/19  8:46 PM   Specimen: Heel; Wound  Result Value Ref Range Status   Specimen Description HEEL  Final   Special Requests NONE  Final   Gram Stain   Final    NO WBC SEEN MODERATE GRAM POSITIVE RODS FEW GRAM POSITIVE COCCI    Culture   Final    CULTURE REINCUBATED FOR BETTER GROWTH Performed at [facility], [address] [place], [place] [address]    Report Status PENDING  Incomplete    Radiology Reports DG Orbits  Result Date: 11/17/2019 CLINICAL DATA:  MRI screening. EXAM: ORBITS - COMPLETE 4+ VIEW  COMPARISON:  None. FINDINGS: There is no evidence of fracture or other significant bone abnormality. A 1.03 mm calcified opacity is seen within the soft tissues just above the region of the zygomatic arch on the right. No orbital emphysema or sinus air-fluid levels are seen. No radiopaque foreign bodies are identified. IMPRESSION: No evidence of an acute abnormality or radiopaque foreign body. Electronically Signed   By: Aram Candela M.D.   On: 11/17/2019 03:55   DG Abd 1 View  Result Date: 11/17/2019 CLINICAL DATA:  MRI screening. EXAM: ABDOMEN - 1 VIEW COMPARISON:  None. FINDINGS: The bowel gas pattern is normal. A large amount of stool is seen within the ascending colon. No radio-opaque calculi or other significant radiographic abnormality are seen. No radiopaque foreign bodies are identified. IMPRESSION: 1. No evidence of radiopaque foreign body. 2. Large stool burden. Electronically Signed   By: Aram Candela M.D.   On: 11/17/2019 03:57   DG CHEST PORT 1 VIEW  Result Date: 11/17/2019 CLINICAL DATA:  Right foot infection. EXAM: PORTABLE CHEST 1 VIEW COMPARISON:  July 22, 2019 FINDINGS: Very mild, predominant stable scarring and/or atelectasis is seen within the bilateral lung bases. The heart size and mediastinal contours are within normal limits. The visualized skeletal structures are unremarkable. IMPRESSION: Very mild, predominant stable scarring and/or atelectasis in the bilateral lung bases. Electronically Signed   By: Aram Candela M.D.   On: 11/17/2019 03:51   DG Foot 2 Views Right  Result Date: 11/16/2019 CLINICAL DATA:  Right heel ulceration, evaluate for evidence of osteomyelitis EXAM: RIGHT FOOT - 2 VIEW COMPARISON:  08/24/2013 FINDINGS: Osteopenia. No fracture or dislocation. Joint spaces are preserved. Soft tissue ulceration of the plantar heel without evidence of underlying bony erosion or sclerosis. Diffuse soft tissue edema about the foot and ankle. IMPRESSION: 1.   Osteopenia.  No fracture or dislocation of the right foot. 2. Soft tissue ulceration of the plantar heel without evidence of underlying bony erosion or sclerosis. Consider MRI to more sensitively evaluate for bone marrow edema and osteomyelitis if clinically suspected. 3.  Soft tissue edema about the foot and ankle. Electronically Signed   By: Trinna Post  Jayme Cloud M.D.   On: 11/16/2019 15:27

## 2019-11-18 NOTE — Progress Notes (Signed)
VASCULAR LAB PRELIMINARY  PRELIMINARY  PRELIMINARY  PRELIMINARY  ABIs completed.    Preliminary report:  See CV proc for preliminary results.   Vollie Brunty, RVT 11/18/2019, 5:03 PM

## 2019-11-18 NOTE — Progress Notes (Addendum)
I received a call from MRI to try to get the pt down tonight for the scan that was wanted a couple days ago but had been unable to do because the pt was combative.  MRI called back sometime later this morning explaining that they were unable to get the pt at this time and would try again later today if the pt was cooperative.    Pt has been out of two-point restraints all evening.  The safety mittens have not been used, since they only agitate him further.  Pt was cooperative with most nursing interventions.  He slept/rested for most of the evening.  No acute events overnight.

## 2019-11-18 NOTE — Progress Notes (Signed)
Attempted MRI of Right heel. Patient uncooperative and refusing to go into MRI scanner or stay on scanning table. Unable to keep facemask in place to maintain isolation precautions. RN notified that MRI scan attempt was unsuccessful.

## 2019-11-18 NOTE — Progress Notes (Signed)
Pt with transport to MRI 

## 2019-11-19 DIAGNOSIS — I480 Paroxysmal atrial fibrillation: Secondary | ICD-10-CM | POA: Diagnosis not present

## 2019-11-19 DIAGNOSIS — E43 Unspecified severe protein-calorie malnutrition: Secondary | ICD-10-CM | POA: Diagnosis not present

## 2019-11-19 DIAGNOSIS — L97516 Non-pressure chronic ulcer of other part of right foot with bone involvement without evidence of necrosis: Secondary | ICD-10-CM

## 2019-11-19 DIAGNOSIS — L03115 Cellulitis of right lower limb: Principal | ICD-10-CM

## 2019-11-19 DIAGNOSIS — F419 Anxiety disorder, unspecified: Secondary | ICD-10-CM | POA: Diagnosis not present

## 2019-11-19 DIAGNOSIS — E039 Hypothyroidism, unspecified: Secondary | ICD-10-CM | POA: Diagnosis not present

## 2019-11-19 LAB — CBC
HCT: 40.9 % (ref 39.0–52.0)
Hemoglobin: 13.4 g/dL (ref 13.0–17.0)
MCH: 30.5 pg (ref 26.0–34.0)
MCHC: 32.8 g/dL (ref 30.0–36.0)
MCV: 93 fL (ref 80.0–100.0)
Platelets: 160 10*3/uL (ref 150–400)
RBC: 4.4 MIL/uL (ref 4.22–5.81)
RDW: 13.6 % (ref 11.5–15.5)
WBC: 6.9 10*3/uL (ref 4.0–10.5)
nRBC: 0 % (ref 0.0–0.2)

## 2019-11-19 LAB — BASIC METABOLIC PANEL
Anion gap: 8 (ref 5–15)
BUN: 16 mg/dL (ref 8–23)
CO2: 23 mmol/L (ref 22–32)
Calcium: 7.9 mg/dL — ABNORMAL LOW (ref 8.9–10.3)
Chloride: 108 mmol/L (ref 98–111)
Creatinine, Ser: 1.08 mg/dL (ref 0.61–1.24)
GFR calc Af Amer: >60 mL/min (ref >60)
GFR calc non Af Amer: >60 mL/min (ref >60)
Glucose, Bld: 98 mg/dL (ref 70–99)
Potassium: 4.5 mmol/L (ref 3.5–5.1)
Sodium: 139 mmol/L (ref 135–145)

## 2019-11-19 LAB — SEDIMENTATION RATE: Sed Rate: 45 mm/hr — ABNORMAL HIGH (ref 0–16)

## 2019-11-19 MED ORDER — COLLAGENASE 250 UNIT/GM EX OINT
TOPICAL_OINTMENT | Freq: Every day | CUTANEOUS | Status: DC
  Filled 2019-11-19: qty 30

## 2019-11-19 MED ORDER — DOXYCYCLINE HYCLATE 100 MG PO TABS
100.0000 mg | ORAL_TABLET | Freq: Two times a day (BID) | ORAL | Status: DC
  Administered 2019-11-19 – 2019-11-21 (×3): 100 mg via ORAL
  Filled 2019-11-19 (×5): qty 1

## 2019-11-19 NOTE — Evaluation (Addendum)
Physical Therapy Evaluation Patient Details Name: Eric [name] MRN: [id number] DOB: May 18, 1946 Today's Date: 11/19/2019   History of Present Illness  74 year old male brought in by EMS for the concern of right foot ulcer/infection with cellulitis. Patient admitted 11/16/19. Xray R foot showed osteopenia. MRI R foot cancelled. 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 Patient is not a good surgical candidate and recommendation is for long term antibiotics, PRAFO boot at all times to offload ulcerative area, TDWB RLE, confirmed with Dr. Sharol Given via secure message on 11/19/19. PMH: paroxysmal A. fib, hypertension, hypothyroidism, dementia, chronic combined systolic and diastolic congestive heart failure, hypertension       Clinical Impression  Patient presents with decreased activity tolerance and decreased strength. He was able to minimally participate in physical therapy evaluation. Patient required maxA x 2 for rolling bilaterally in bed. Patient is still awaiting PRAFO boot RLE as ordered by orthopedics. Secretary aware and assisting in obtaining PRAFO boot for patient. Patient would required [facility] lift for transfers at this time.  Per phone discussion with patient's ALF ([facility]) after PT evaluation was completed, patient's PLOF: patient would pivot to his wheelchair usually on his own. He sometimes requires assistance for transfers but does not ring call bell for assistance. He has a wheelchair and hospital bed but spends much of his time in the wheelchair. He sometimes requires assistance for toilet transfers wheelchair level. Patient receives assistance for bathing and dressing at baseline.  Patient does not have any other DME. He does not walk at baseline.    Follow Up Recommendations SNF    Equipment Recommendations  Hospital bed;Other (comment)(hoyer lift)       Precautions / Restrictions  Precautions Precautions: Fall;Other (comment) Precaution Comments: history of dementia Required Braces or Orthoses: Other Brace Other Brace: PRAFO R foot Restrictions Weight Bearing Restrictions: Yes RLE Weight Bearing: Touchdown weight bearing Other Position/Activity Restrictions: with PRAFO boot at all times      Mobility  Bed Mobility Overal bed mobility: Needs Assistance Bed Mobility: Rolling Rolling: Max assist;+2 for physical assistance;+2 for safety/equipment  General bed mobility comments: rolling bilaterally with maxA x 2, encouragement for patient to use with UEs using bedrail  Transfers   General transfer comment: Patient too lethargic to attempt         Pertinent Vitals/Pain Pain Assessment: Faces Faces Pain Scale: No hurt    Home Living Family/patient expects to be discharged to:: Assisted living Living Arrangements: Alone    Additional Comments: [facility]    Prior Function  Comments: Patient poor historian. He reports he dresses on his own and ambulates with a walker. Need to confirm with his ALF about PLOF.        Extremity/Trunk Assessment     Lower Extremity Assessment Lower Extremity Assessment: RLE deficits/detail;LLE deficits/detail;Generalized weakness RLE Deficits / Details: grossly 2-/5, patient unable to fully participate in evaluation LLE Deficits / Details: grossly 2-/5, patient unable to fully participate in evaluation     Communication     Cognition Arousal/Alertness: Lethargic   Overall Cognitive Status: History of cognitive impairments - at baseline    General Comments: history of dementia per chart review      General Comments General comments (skin integrity, edema, etc.): Patient on 2L [place]. Despite changing finger probe, unable to get accurate oxygen saturation reading. Patient noted to breathe through his mouth.        Assessment/Plan  PT Assessment Patient needs continued PT services  PT Problem List  Decreased strength;Decreased range of motion;Decreased activity tolerance;Decreased balance;Decreased mobility;Decreased cognition;Decreased knowledge of use of DME;Decreased safety awareness;Decreased knowledge of precautions;Cardiopulmonary status limiting activity;Decreased skin integrity       PT Treatment Interventions DME instruction;Gait training;Functional mobility training;Therapeutic activities;Therapeutic exercise;Balance training;Patient/family education;Wheelchair mobility training    PT Goals (Current goals can be found in the Care Plan section)  Acute Rehab PT Goals PT Goal Formulation: Patient unable to participate in goal setting Time For Goal Achievement: 12/02/19 Potential to Achieve Goals: Fair    Frequency Min 2X/week           AM-PAC PT "6 Clicks" Mobility  Outcome Measure Help needed turning from your back to your side while in a flat bed without using bedrails?: A Lot Help needed moving from lying on your back to sitting on the side of a flat bed without using bedrails?: A Lot Help needed moving to and from a bed to a chair (including a wheelchair)?: Total Help needed standing up from a chair using your arms (e.g., wheelchair or bedside chair)?: Total Help needed to walk in hospital room?: Total Help needed climbing 3-5 steps with a railing? : Total 6 Click Score: 8    End of Session Equipment Utilized During Treatment: Oxygen   Patient left: in bed;with call bell/phone within reach;with bed alarm set Nurse Communication: Other (comment)(nurse present during mobility portion) PT Visit Diagnosis: Other abnormalities of gait and mobility (R26.89);Muscle weakness (generalized) (M62.81)    Time: 4196-2229 PT Time Calculation (min) (ACUTE ONLY): 38 min   Charges:   PT Evaluation $PT Eval Moderate Complexity: 1 Mod          Angelene Giovanni, PT, DPT Acute Rehab [phone] office    Angelene Giovanni 11/19/2019, 11:58 AM

## 2019-11-19 NOTE — Progress Notes (Signed)
Orthopedic Tech Progress Note Patient Details:  Eric [name] 1945-12-20 [id number]  Ortho Devices Type of Ortho Device: Prafo boot/shoe Ortho Device/Splint Location: RLE Ortho Device/Splint Interventions: Ordered, Application   Post Interventions Patient Tolerated: Well Instructions Provided: Care of device   Donald Pore 11/19/2019, 12:15 PM

## 2019-11-19 NOTE — NC FL2 (Signed)
[place] LEVEL OF CARE SCREENING TOOL     IDENTIFICATION  Patient Name: Eric [name] Birthdate: 1946-06-07 Sex: male Admission Date (Current Location): 11/16/2019  [facility] and [place] Number:  Engineering geologist and Address:  [facility], [place] [address] [place], [place] [address]      Provider Number: [id number]  Attending Physician Name and Address:  Jonetta Osgood, MD  Relative Name and Phone Number:  [name]    Current Level of Care: Hospital Recommended Level of Care: [place] Prior Approval Number:    Date Approved/Denied:   PASRR Number: [id number]  Discharge Plan: SNF    Current Diagnoses: Patient Active Problem List   Diagnosis Date Noted  . Cellulitis of right lower extremity   . Severe protein-calorie malnutrition ([place])   . Right foot ulcer ([place]) 11/16/2019  . Anxiety and depression   . Cellulitis of right foot   . 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 Chronic combined systolic and diastolic CHF (congestive heart failure) ([place]) 11/16/2018  . Anasarca 11/16/2018  . Hypothyroidism 11/16/2018  . PAF (paroxysmal atrial fibrillation) ([place]) 11/16/2018  . HTN (hypertension) 11/16/2018  . Generalized anxiety disorder 11/03/2018  . Major depression in partial remission ([place]) 11/03/2018  . Acute hypoxemic respiratory failure ([place]) 10/29/2018  . Suspected COVID-19 virus infection 10/29/2018    Orientation RESPIRATION BLADDER Height & Weight     Self  O2(Nasal cannula 2L) Incontinent, External catheter Weight: 203 lb 0.7 oz (92.1 kg) Height:     BEHAVIORAL SYMPTOMS/MOOD NEUROLOGICAL BOWEL NUTRITION STATUS  (Dementia)   Continent Diet(Regular)  AMBULATORY STATUS COMMUNICATION OF NEEDS Skin   Extensive Assist(TDWB with PRAFO boot) Verbally Other (Comment)(Santyl dressing wound care changes and a PRAFO boot on foot to be worn at  all times to unload the ulcerative area)                       Personal Care Assistance Level of Assistance  Bathing, Feeding, Dressing Bathing Assistance: Maximum assistance Feeding assistance: Limited assistance Dressing Assistance: Limited assistance     Functional Limitations Info  Sight, Hearing, Speech Sight Info: Adequate Hearing Info: Adequate Speech Info: Adequate    SPECIAL CARE FACTORS FREQUENCY  PT (By licensed PT), OT (By licensed OT)     PT Frequency: 5x/week OT Frequency: 5x/week            Contractures Contractures Info: Not present    Additional Factors Info  Code Status, Allergies, Isolation Precautions Code Status Info: Full Allergies Info: Demerol (Meperidine), Epinephrine, Nardil (Phenelzine), Morphine And Related, Ondansetron, Shellfish Allergy     Isolation Precautions Info: HX of ESBL on 05/18/19; COVID positive since 1/15 without symptoms and not on isolation     Current Medications (11/19/2019):  This is the current hospital active medication list Current Facility-Administered Medications  Medication Dose Route Frequency Provider Last Rate Last Admin  . acetaminophen (TYLENOL) tablet 650 mg  650 mg Oral Q6H PRN Pahwani, Rinka R, MD       Or  . acetaminophen (TYLENOL) suppository 650 mg  650 mg Rectal Q6H PRN Pahwani, Rinka R, MD      . albuterol (PROVENTIL) (2.5 MG/3ML) 0.083% nebulizer solution 0.63 mg  0.63 mg Nebulization Q6H PRN Pahwani, Rinka R, MD      . collagenase (SANTYL) ointment   Topical Daily Newt Minion, MD   Given at  11/19/19 1334  . diazepam (VALIUM) tablet 1 mg  1 mg Oral Daily Legrand Pitts, RPH   1 mg at 11/19/19 3382  . diazepam (VALIUM) tablet 2 mg  2 mg Oral QHS Legrand Pitts, RPH   2 mg at 11/17/19 2316  . doxycycline (VIBRA-TABS) tablet 100 mg  100 mg Oral Q12H Maretta Bees, MD   100 mg at 11/19/19 1649  . enoxaparin (LOVENOX) injection 40 mg  40 mg Subcutaneous Q24H Pahwani, Rinka R, MD   40 mg at  11/18/19 1703  . haloperidol lactate (HALDOL) injection 2 mg  2 mg Intravenous Q6H PRN Sherryll Burger, Pratik D, DO   2 mg at 11/18/19 1402  . levothyroxine (SYNTHROID) tablet 175 mcg  175 mcg Oral QAC breakfast Legrand Pitts, RPH   175 mcg at 11/19/19 5053  . OLANZapine (ZYPREXA) tablet 5 mg  5 mg Oral Daily PRN Maurilio Lovely D, DO   5 mg at 11/19/19 0606  . OLANZapine zydis (ZYPREXA) disintegrating tablet 10 mg  10 mg Oral BID Sherryll Burger, Pratik D, DO   10 mg at 11/19/19 9767  . sertraline (ZOLOFT) tablet 25 mg  25 mg Oral Daily Pahwani, Rinka R, MD   25 mg at 11/19/19 3419     Discharge Medications: Please see discharge summary for a list of discharge medications.  Relevant Imaging Results:  Relevant Lab Results:   Additional Information ss # [id number]  Mearl Latin, LCSW

## 2019-11-19 NOTE — Progress Notes (Signed)
PROGRESS NOTE                                                                                                                                                                                                             Patient Demographics:    Eric [name], is a 74 y.o. male, DOB - 1946-04-15, WJX:[id number]  Outpatient Primary MD for the patient is System, Pcp Not In   Admit date - 11/16/2019   LOS - 3  Chief Complaint  Patient presents with  . Wound Infection       Brief Narrative: Patient is a 74 y.o. male with PMHx of PAF, HTN, hypothyroidism, chronic combined systolic/diastolic heart failure, dementia-who was brought from SNF for concern of right foot/heel ulcer with infection.  Significant Events: 4/30>> admit to [facility] from SNF-with right foot cellulitis/wound infection.  COVID-19 medications: None  Antibiotics: Doxycycline: 5/3>> Vancomycin: 4/30>>5/3 Zosyn: 4/30>>5/3  Microbiology data: 4/30: Blood culture>> negative so far 4/30: Right heel wound swab>> no growth so far   DVT prophylaxis: SQ Lovenox  Procedures: None  Consults: Ortho    Subjective:   Pleasantly confused-answer some questions appropriately.   Assessment  & Plan :   Right leg soft tissue infection/cellulitis with heel ulceration: Erythema/swelling improved-concern for underlying osteomyelitis-unfortunately patient not cooperative for MRI-hands CT scan foot performed-no evidence of osteomyelitis-awaiting formal ABI results.  However evaluated by Dr. Fidela Juneauuda-recommendations are for wound care and transitioning to long-term doxycycline.    Chronic combined systolic and diastolic heart failure: Euvolemic-no echocardiogram on file.  BP appears to be soft-continue to hold diuretics.  ?  Chronic hypoxic respiratory failure: SNF medication lists O2 2 L/min as needed-suspect that he probably has some amount of chronic hypoxic respiratory  failure from CHF.  He does not have cough or shortness of breath-to suggest a acute component to chronic hypoxic respiratory failure.  HTN: Blood pressure soft-continue to hold all antihypertensives for now.  History of PAF: Sinus rhythm-suspect not on anticoagulation given advanced dementia/poor candidate.   Hypothyroidism: TSH elevated at 27.0-not sure when dosage of levothyroxine adjusted last-we will go ahead and increase levothyroxine to 200 mcg-repeat TSH in a few weeks.  Dementia with delirium: Somewhat confused last night-at risk for worsening delirium-continue olanzapine, Zoloft, and Valium.  History of COVID-19 infection: Social work confirmed that patient had a positive COVID-19 test  on August 03, 2019.  He does not have any signs of active infection-suspect that this is a residue of his prior infection.  I spoke with the microbiology lab-CT threshold for PCR is 38.5-which is in the noninfectious range.  I spoke with Dr. Doristine Church to discontinue isolation.  Dr. Ilsa Iha ordered a IgG for COVID-19-we will follow.   Fever: afebrile  O2 requirements:  SpO2: 94 % O2 Flow Rate (L/min): 2 L/min   COVID-19 Labs: Recent Labs    11/16/19 1633 11/17/19 1210 11/18/19 0538  CRP 1.1* 2.5* 2.6*       Component Value Date/Time   BNP 21.0 06/20/2019 1500    No results for input(s): PROCALCITON in the last 168 hours.  Lab Results  Component Value Date   SARSCOV2NAA POSITIVE (A) 11/16/2019   SARSCOV2NAA NEGATIVE 01/31/2019   SARSCOV2NAA NEGATIVE 01/18/2019   SARSCOV2NAA NEGATIVE 11/15/2018      Obesity: Estimated body mass index is 29.98 kg/m as calculated from the following:   Height as of 07/22/19: 5\' 9"  (1.753 m).   Weight as of this encounter: 92.1 kg.   RN pressure injury documentation: Pressure Injury 11/18/18 Stage II -  Partial thickness loss of dermis presenting as a shallow open ulcer with a red, pink wound bed without slough. (Active)  11/18/18 0956  Location:  Buttocks  Location Orientation: Right;Lower  Staging: Stage II -  Partial thickness loss of dermis presenting as a shallow open ulcer with a red, pink wound bed without slough.  Wound Description (Comments):   Present on Admission:     ABG: No results found for: PHART, PCO2ART, PO2ART, HCO3, TCO2, ACIDBASEDEF, O2SAT  Vent Settings: N/  Condition - Stable  Family Communication  : Called son at 01/18/19 to leave message-on 5/3  Code Status :  Full Code  Diet :  Diet Order            Diet regular Room service appropriate? Yes; Fluid consistency: Thin  Diet effective now               Disposition Plan  :   Status is: Inpatient  Remains inpatient appropriate because:Inpatient level of care appropriate due to severity of illness  Dispo: The patient is from: SNF              Anticipated d/c is to: SNF              Anticipated d/c date is: > 3 days              Patient currently is not medically stable to d/c.   Barriers to discharge: Right heel soft tissue infection with ulceration-requiring IV antimicrobial therapy.  Antimicorbials  :    Anti-infectives (From admission, onward)   Start     Dose/Rate Route Frequency Ordered Stop   11/17/19 0600  vancomycin (VANCOREADY) IVPB 750 mg/150 mL     750 mg 150 mL/hr over 60 Minutes Intravenous Every 12 hours 11/16/19 1742     11/17/19 0000  piperacillin-tazobactam (ZOSYN) IVPB 3.375 g     3.375 g 12.5 mL/hr over 240 Minutes Intravenous Every 8 hours 11/16/19 1742     11/16/19 1800  vancomycin (VANCOREADY) IVPB 2000 mg/400 mL     2,000 mg 200 mL/hr over 120 Minutes Intravenous  Once 11/16/19 1737 11/17/19 0041   11/16/19 1745  piperacillin-tazobactam (ZOSYN) IVPB 3.375 g  Status:  Discontinued     3.375 g 100 mL/hr over 30 Minutes Intravenous  Once 11/16/19 1732  11/16/19 1732   11/16/19 1745  vancomycin (VANCOCIN) IVPB 1000 mg/200 mL premix  Status:  Discontinued     1,000 mg 200 mL/hr over 60 Minutes Intravenous   Once 11/16/19 1732 11/16/19 1732   11/16/19 1630  piperacillin-tazobactam (ZOSYN) IVPB 3.375 g     3.375 g 100 mL/hr over 30 Minutes Intravenous  Once 11/16/19 1629 11/16/19 1754      Inpatient Medications  Scheduled Meds: . collagenase   Topical Daily  . diazepam  1 mg Oral Daily  . diazepam  2 mg Oral QHS  . enoxaparin (LOVENOX) injection  40 mg Subcutaneous Q24H  . levothyroxine  175 mcg Oral QAC breakfast  . OLANZapine zydis  10 mg Oral BID  . sertraline  25 mg Oral Daily   Continuous Infusions: . piperacillin-tazobactam (ZOSYN)  IV 3.375 g (11/19/19 0942)  . vancomycin 750 mg (11/19/19 0603)   PRN Meds:.acetaminophen **OR** acetaminophen, albuterol, haloperidol lactate, OLANZapine   Time Spent in minutes  25  See all Orders from today for further details   Jeoffrey Massed M.D on 11/19/2019 at 2:54 PM  To page go to [email or web address] - use universal password  [facility] -  Office  [phone]    Objective:   Vitals:   11/18/19 0530 11/18/19 1357 11/18/19 1954 11/19/19 1300  BP: (!) 94/57 103/88 104/63 119/60  Pulse: 78 94 74   Resp: 14 16 14    Temp: 98.9 F (37.2 C) 98.2 F (36.8 C) 97.9 F (36.6 C) 97.6 F (36.4 C)  TempSrc: Axillary Axillary Axillary Oral  SpO2: 99% 91% 94%   Weight: 92.1 kg       Wt Readings from Last 3 Encounters:  11/18/19 92.1 kg  07/22/19 104.3 kg  06/20/19 90.7 kg     Intake/Output Summary (Last 24 hours) at 11/19/2019 1454 Last data filed at 11/19/2019 0400 Gross per 24 hour  Intake 286.78 ml  Output --  Net 286.78 ml     Physical Exam Gen Exam: Mildly confused but mostly awake and alert-not in any distress.   HEENT:atraumatic, normocephalic Chest: B/L clear to auscultation anteriorly CVS:S1S2 regular Abdomen:soft non tender, non distended Extremities:no edema Neurology: Difficult exam given some confusion but seems to be moving all 4 extremities. Skin: no rash   Data Review:    CBC Recent Labs  Lab  11/16/19 1517 11/16/19 1733 11/18/19 0538 11/19/19 1417  WBC 18.7* 16.8* 9.6 6.9  HGB 14.5 13.6 12.5* 13.4  HCT 43.5 42.0 38.7* 40.9  PLT 271 239 203 160  MCV 91.8 94.4 93.0 93.0  MCH 30.6 30.6 30.0 30.5  MCHC 33.3 32.4 32.3 32.8  RDW 13.6 13.5 13.8 13.6  LYMPHSABS 0.5*  --   --   --   MONOABS 1.4*  --   --   --   EOSABS 0.1  --   --   --   BASOSABS 0.1  --   --   --     Chemistries  Recent Labs  Lab 11/16/19 1517 11/16/19 1733 11/18/19 0538 11/19/19 0541  NA 133*  --  139 139  K 4.6  --  3.8 4.5  CL 103  --  105 108  CO2 20*  --  26 23  GLUCOSE 124*  --  134* 98  BUN 27*  --  18 16  CREATININE 1.15 1.24 1.03 1.08  CALCIUM 8.1*  --  7.9* 7.9*  MG  --  2.3  --   --   AST  --   --  17  --   ALT  --   --  15  --   ALKPHOS  --   --  51  --   BILITOT  --   --  0.3  --    ------------------------------------------------------------------------------------------------------------------ No results for input(s): CHOL, HDL, LDLCALC, TRIG, CHOLHDL, LDLDIRECT in the last 72 hours.  No results found for: HGBA1C ------------------------------------------------------------------------------------------------------------------ Recent Labs    11/17/19 1210  TSH 27.010*   ------------------------------------------------------------------------------------------------------------------ No results for input(s): VITAMINB12, FOLATE, FERRITIN, TIBC, IRON, RETICCTPCT in the last 72 hours.  Coagulation profile No results for input(s): INR, PROTIME in the last 168 hours.  No results for input(s): DDIMER in the last 72 hours.  Cardiac Enzymes No results for input(s): CKMB, TROPONINI, MYOGLOBIN in the last 168 hours.  Invalid input(s): CK ------------------------------------------------------------------------------------------------------------------    Component Value Date/Time   BNP 21.0 06/20/2019 1500    Micro Results Recent Results (from the past 240 hour(s))  Blood  culture (routine x 2)     Status: None (Preliminary result)   Collection Time: 11/16/19  4:45 PM   Specimen: BLOOD LEFT FOREARM  Result Value Ref Range Status   Specimen Description BLOOD LEFT FOREARM  Final   Special Requests   Final    BOTTLES DRAWN AEROBIC AND ANAEROBIC Blood Culture results may not be optimal due to an inadequate volume of blood received in culture bottles   Culture   Final    NO GROWTH 3 DAYS Performed at [facility], [address], [place], [place] [address]    Report Status PENDING  Incomplete  Blood culture (routine x 2)     Status: None (Preliminary result)   Collection Time: 11/16/19  4:45 PM   Specimen: BLOOD  Result Value Ref Range Status   Specimen Description BLOOD LEFT ANTECUBITAL  Final   Special Requests   Final    BOTTLES DRAWN AEROBIC ONLY Blood Culture results may not be optimal due to an inadequate volume of blood received in culture bottles   Culture   Final    NO GROWTH 3 DAYS Performed at [facility], [address], [place], [place] [address]    Report Status PENDING  Incomplete  Respiratory Panel by RT PCR (Flu A&B, Covid) - Nasopharyngeal Swab     Status: Abnormal   Collection Time: 11/16/19  6:32 PM   Specimen: Nasopharyngeal Swab  Result Value Ref Range Status   SARS Coronavirus 2 by RT PCR POSITIVE (A) NEGATIVE Final    Comment: RESULT CALLED TO, READ BACK BY AND VERIFIED WITH: Mayer Camel RN 11/16/19 1955 JDW (NOTE) SARS-CoV-2 target nucleic acids are DETECTED. SARS-CoV-2 RNA is generally detectable in upper respiratory specimens  during the acute phase of infection. Positive results are indicative of the presence of the identified virus, but do not rule out bacterial infection or co-infection with other pathogens not detected by the test. Clinical correlation with patient history and other diagnostic information is necessary to determine patient infection status. The expected result is Negative. Fact Sheet for  Patients:  https://www.moore.com/ Fact Sheet for Healthcare Providers: https://www.young.biz/ This test is not yet approved or cleared by the [place] FDA and  has been authorized for detection and/or diagnosis of SARS-CoV-2 by FDA under an Emergency Use Authorization (EUA).  This EUA will remain in effect (meaning this test can be used) for the  duration of  the COVID-19 declaration under Section 564(b)(1) of the Act, 21 U.S.C. section 360bbb-3(b)(1), unless the authorization  is terminated or revoked sooner.    Influenza A by PCR NEGATIVE NEGATIVE Final   Influenza B by PCR NEGATIVE NEGATIVE Final    Comment: (NOTE) The Xpert Xpress SARS-CoV-2/FLU/RSV assay is intended as an aid in  the diagnosis of influenza from Nasopharyngeal swab specimens and  should not be used as a sole basis for treatment. Nasal washings and  aspirates are unacceptable for Xpert Xpress SARS-CoV-2/FLU/RSV  testing. Fact Sheet for Patients: https://www.moore.com/ Fact Sheet for Healthcare Providers: https://www.young.biz/ This test is not yet approved or cleared by the [place] FDA and  has been authorized for detection and/or diagnosis of SARS-CoV-2 by  FDA under an Emergency Use Authorization (EUA). This EUA will remain  in effect (meaning this test can be used) for the duration of the  Covid-19 declaration under Section 564(b)(1) of the Act, 21  U.S.C. section 360bbb-3(b)(1), unless the authorization is  terminated or revoked. Performed at [facility], [address], [place], [place] [address]   Wound or Superficial Culture     Status: None (Preliminary result)   Collection Time: 11/16/19  8:46 PM   Specimen: Heel; Wound  Result Value Ref Range Status   Specimen Description HEEL  Final   Special Requests NONE  Final   Gram Stain   Final    NO WBC SEEN MODERATE GRAM POSITIVE RODS FEW GRAM POSITIVE COCCI      Culture   Final    CULTURE REINCUBATED FOR BETTER GROWTH Performed at [facility], [address], [place], [place] [address]    Report Status PENDING  Incomplete    Radiology Reports DG Orbits  Result Date: 11/17/2019 CLINICAL DATA:  MRI screening. EXAM: ORBITS - COMPLETE 4+ VIEW COMPARISON:  None. FINDINGS: There is no evidence of fracture or other significant bone abnormality. A 1.03 mm calcified opacity is seen within the soft tissues just above the region of the zygomatic arch on the right. No orbital emphysema or sinus air-fluid levels are seen. No radiopaque foreign bodies are identified. IMPRESSION: No evidence of an acute abnormality or radiopaque foreign body. Electronically Signed   By: Aram Candela M.D.   On: 11/17/2019 03:55   DG Abd 1 View  Result Date: 11/17/2019 CLINICAL DATA:  MRI screening. EXAM: ABDOMEN - 1 VIEW COMPARISON:  None. FINDINGS: The bowel gas pattern is normal. A large amount of stool is seen within the ascending colon. No radio-opaque calculi or other significant radiographic abnormality are seen. No radiopaque foreign bodies are identified. IMPRESSION: 1. No evidence of radiopaque foreign body. 2. Large stool burden. Electronically Signed   By: Aram Candela M.D.   On: 11/17/2019 03:57   CT FOOT RIGHT WO CONTRAST  Result Date: 11/18/2019 CLINICAL DATA:  Right leg soft tissue infection/cellulitis with heel ulceration. EXAM: CT OF THE RIGHT FOOT WITHOUT CONTRAST TECHNIQUE: Multidetector CT imaging of the right foot was performed according to the standard protocol. Multiplanar CT image reconstructions were also generated. COMPARISON:  Foot radiographs 11/16/2019 FINDINGS: Bones/Joint/Cartilage No bony destructive findings to suggest active osteomyelitis. Mild bony demineralization. Ligaments Suboptimally assessed by CT. Muscles and Tendons Probable distal tibialis posterior tendinopathy. Flexor hallucis longus tenosynovitis in the vicinity of the knot of  Henry. Soft tissues Subcutaneous edema circumferentially around the ankle and tracking in the dorsum of the foot and to a lesser extent along the heel. There is a heel ulceration with the crater extending into the superficial subcutaneous tissues but not into the deep subcutaneous tissues  or to the level of the plantar fascia; there appears to be about 1.6 cm of subcutaneous adipose tissue between the deep margin of this ulcer crater and the plantar fascia superficial fascia margin. I do not discern a drainable abscess. No tibiotalar joint effusion. IMPRESSION: 1. Subcutaneous edema circumferentially around the ankle and tracking in the dorsum of the foot and to a lesser extent along the heel, compatible with cellulitis. No drainable abscess identified. 2. Plantar heel ulceration, with or out 1.6 cm of subcutaneous adipose tissue between deep margin of the ulcer crater and plantar fascia. 3. Probable distal tibialis posterior tendinopathy. Flexor hallucis longus tenosynovitis in the vicinity of the knot of Henry. 4. No bony destructive findings to suggest active osteomyelitis. Electronically Signed   By: Gaylyn Rong M.D.   On: 11/18/2019 16:06   DG CHEST PORT 1 VIEW  Result Date: 11/17/2019 CLINICAL DATA:  Right foot infection. EXAM: PORTABLE CHEST 1 VIEW COMPARISON:  July 22, 2019 FINDINGS: Very mild, predominant stable scarring and/or atelectasis is seen within the bilateral lung bases. The heart size and mediastinal contours are within normal limits. The visualized skeletal structures are unremarkable. IMPRESSION: Very mild, predominant stable scarring and/or atelectasis in the bilateral lung bases. Electronically Signed   By: Aram Candela M.D.   On: 11/17/2019 03:51   DG Foot 2 Views Right  Result Date: 11/16/2019 CLINICAL DATA:  Right heel ulceration, evaluate for evidence of osteomyelitis EXAM: RIGHT FOOT - 2 VIEW COMPARISON:  08/24/2013 FINDINGS: Osteopenia. No fracture or dislocation.  Joint spaces are preserved. Soft tissue ulceration of the plantar heel without evidence of underlying bony erosion or sclerosis. Diffuse soft tissue edema about the foot and ankle. IMPRESSION: 1.  Osteopenia.  No fracture or dislocation of the right foot. 2. Soft tissue ulceration of the plantar heel without evidence of underlying bony erosion or sclerosis. Consider MRI to more sensitively evaluate for bone marrow edema and osteomyelitis if clinically suspected. 3.  Soft tissue edema about the foot and ankle. Electronically Signed   By: Lauralyn Primes M.D.   On: 11/16/2019 15:27   VAS [place] ABI WITH/WO TBI  Result Date: 11/18/2019 LOWER EXTREMITY DOPPLER STUDY Indications: Ulceration, and gangrene. High Risk Factors: Hypertension.  Limitations: Today's exam was limited due to altered mental status, skin texture              and involuntary patient movement. Comparison Study: No prior study Performing Technologist: Sherren Kerns RVS  Examination Guidelines: A complete evaluation includes at minimum, Doppler waveform signals and systolic blood pressure reading at the level of bilateral brachial, anterior tibial, and posterior tibial arteries, when vessel segments are accessible. Bilateral testing is considered an integral part of a complete examination. Photoelectric Plethysmograph (PPG) waveforms and toe systolic pressure readings are included as required and additional duplex testing as needed. Limited examinations for reoccurring indications may be performed as noted.  ABI Findings: +---------+------------------+-----+--------+----------------------+ Right    Rt Pressure (mmHg)IndexWaveformComment                +---------+------------------+-----+--------+----------------------+ Brachial                                pt positioning and AMS +---------+------------------+-----+--------+----------------------+ PTA      139               1.40 biphasic                        +---------+------------------+-----+--------+----------------------+  DP       110               1.11 biphasic                       +---------+------------------+-----+--------+----------------------+ Great Toe50                0.51                                +---------+------------------+-----+--------+----------------------+ +---------+------------------+-----+--------+-----------------------------+ Left     Lt Pressure (mmHg)IndexWaveformComment                       +---------+------------------+-----+--------+-----------------------------+ Brachial 99                                                           +---------+------------------+-----+--------+-----------------------------+ PTA                                     pt refusal to keep foot still +---------+------------------+-----+--------+-----------------------------+ DP       143               1.44 biphasic                              +---------+------------------+-----+--------+-----------------------------+ Great Toe85                0.86                                       +---------+------------------+-----+--------+-----------------------------+ +-------+-----------+-----------+------------+------------+ ABI/TBIToday's ABIToday's TBIPrevious ABIPrevious TBI +-------+-----------+-----------+------------+------------+ Right  1.4        0.51                                +-------+-----------+-----------+------------+------------+ Left   1.4        0.86                                +-------+-----------+-----------+------------+------------+ Arterial wall calcification precludes accurate ankle pressures and ABIs.  Summary: Right: Resting right ankle-brachial index indicates noncompressible right lower extremity arteries. The right toe-brachial index is abnormal. Left: Resting left ankle-brachial index indicates noncompressible left lower extremity arteries. The left toe-brachial  index is normal.  *See table(s) above for measurements and observations.     Preliminary

## 2019-11-19 NOTE — TOC Initial Note (Signed)
Transition of Care [facility]) - Initial/Assessment Note    Patient Details  Name: Eric [name] MRN: [id number] Date of Birth: July 20, 1945  Transition of Care [facility]) CM/SW Contact:    Mearl Latin, LCSW Phone Number: 11/19/2019, 4:40 PM  Clinical Narrative:                 CSW spoke with ALF. They reported that patient will require SNF prior to returning there. They alerted CSW that patient's legal guardian is [facility]. CSW spoke with Saintclair Halsted; she is providing CSW with patient's social worker contact info but states that patient can go to SNF and they will assist with signing admission paperwork. CSW sent referral out for review and will discuss bed offers with Guardian tomorrow.   Expected Discharge Plan: Skilled Nursing Facility Barriers to Discharge: SNF Pending bed offer   Patient Goals and CMS Choice Patient states their goals for this hospitalization and ongoing recovery are:: Rehab CMS Medicare.gov Compare Post Acute Care list provided to:: Legal Guardian([facility]) Choice offered to / list presented to : [facility] POA / Guardian  Expected Discharge Plan and Services Expected Discharge Plan: Skilled Nursing Facility In-house Referral: Clinical Social Work   Post Acute Care Choice: Skilled Nursing Facility Living arrangements for the past 2 months: Assisted Living Facility                                      Prior Living Arrangements/Services Living arrangements for the past 2 months: Assisted Living Facility Lives with:: Facility Resident Patient language and need for interpreter reviewed:: Yes Do you feel safe going back to the place where you live?: Yes      Need for Family Participation in Patient Care: Yes (Comment) Care giver support system in place?: Yes (comment)   Criminal Activity/Legal Involvement Pertinent to Current Situation/Hospitalization: No - Comment as needed  Activities of Daily Living      Permission  Sought/Granted Permission sought to share information with : Facility Medical sales representative, Family Supports Permission granted to share information with : No  Share Information with NAME: [facility] DSS  Permission granted to share info w AGENCY: SNFs  Permission granted to share info w Relationship: Legal Guardian     Emotional Assessment   Attitude/Demeanor/Rapport: Unable to Assess Affect (typically observed): Unable to Assess Orientation: : Oriented to Self Alcohol / Substance Use: Not Applicable Psych Involvement: No (comment)  Admission diagnosis:  Pneumonia [J18.9] Encounter for imaging to screen for metal prior to MRI [Z13.89] Cellulitis of right lower extremity [L03.115] Right foot ulcer (HCC) [L97.519] Leukocytosis, unspecified type [D72.829] Diabetic ulcer of right heel associated with type 2 diabetes mellitus, unspecified ulcer stage (HCC) [N39.767, L97.419] Patient Active Problem List   Diagnosis Date Noted  . Cellulitis of right lower extremity   . Severe protein-calorie malnutrition (HCC)   . Right foot ulcer (HCC) 11/16/2019  . Anxiety and depression   . Cellulitis of right foot   . 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 Chronic combined systolic and diastolic CHF (congestive heart failure) (HCC) 11/16/2018  . Anasarca 11/16/2018  . Hypothyroidism 11/16/2018  . PAF (paroxysmal atrial fibrillation) (HCC) 11/16/2018  . HTN (hypertension) 11/16/2018  . Generalized anxiety disorder 11/03/2018  . Major depression in partial remission (HCC) 11/03/2018  . Acute hypoxemic respiratory failure (HCC) 10/29/2018  . Suspected COVID-19 virus infection 10/29/2018  PCP:  System, Pcp Not In Pharmacy:   [id number] [address] [place] [address] Phone: [phone] Fax: [phone]     Social Determinants of Health (SDOH)  Interventions    Readmission Risk Interventions Readmission Risk Prevention Plan 11/03/2018 10/31/2018  Transportation Screening Complete Complete  PCP or Specialist Appt within 5-7 Days Complete -  Home Care Screening Complete Complete  Medication Review (RN CM) Complete -  Some recent data might be hidden

## 2019-11-19 NOTE — Progress Notes (Addendum)
9:58am-CSW notes patient resides at [facility]. CSW left a voicemail for their administrator to see if patient can return COVID+.   10:05am-Per facility administrator, patient tested positive on January 15th for COVID. He refused the vaccines. Patient is able to return at discharge and facility will need FL2 and DC Summary. Patient has PRN oxygen at the ALF.   Pearlina Friedly LCSW

## 2019-11-19 NOTE — Progress Notes (Addendum)
Pt slept for almost the entire shift.  Pt was cooperative and generally pleasant. Heart rate was occasionally brady while sleeping. This morning, pt complained of some discomfort in his lower right abdomen. No acute events overnight.

## 2019-11-19 NOTE — Progress Notes (Signed)
Brief note (full progress note to follow)  Per social work documentation-patient tested positive for COVID-19 on August 03, 2019.  Called microbiology lab-CT threshold on the PCR is 38.5.  Patient has no respiratory symptoms.  Spoke with infectious disease MD-Dr. Malena Edman the test is still positive-it is in the noninfectious range-likely due to past infection and not due to active infection.  Okay to discontinue isolation.

## 2019-11-19 NOTE — Progress Notes (Signed)
Pt has 1+ pitting edema in LUE, worsening to 2+ in left hand. Pt currently wearing a watch that has a tight band and leaving an indention in wrist, pt refuses to remove watch at this time. Elevated pts extremity, will CTM.

## 2019-11-19 NOTE — Progress Notes (Signed)
Pharmacy Antibiotic Note  Eric [name] is a 74 y.o. male admitted on 11/16/2019 with cellulitis and foot ulcer.  Pharmacy has been consulted for Zosyn and vancomycin dosing. WBC 9.6. SCr 1.08. AF.  Per Ortho, patient not a surgical candidate and recommending long term PO abx.   Plan: -Continue Zosyn 3.375 gm IV Q 8 hours (EI infusion) -Continue Vancomycin 750 mg IV Q 12 hours per traditional nomogram  -Monitor CBC, renal fx, cultures and clinical progress -VT at [facility]   Weight: 92.1 kg (203 lb 0.7 oz)  Temp (24hrs), Avg:98.1 F (36.7 C), Min:97.9 F (36.6 C), Max:98.2 F (36.8 C)  Recent Labs  Lab 11/16/19 1517 11/16/19 1733 11/16/19 1800 11/18/19 0538 11/19/19 0541  WBC 18.7* 16.8*  --  9.6  --   CREATININE 1.15 1.24  --  1.03 1.08  LATICACIDVEN  --   --  1.6  --   --     Estimated Creatinine Clearance: 68.3 mL/min (by C-G formula based on SCr of 1.08 mg/dL).    Allergies  Allergen Reactions  . Demerol [Meperidine] Other (See Comments)    Will counteract with a drug he is taking causing fatal reaction with nardil  . Epinephrine Other (See Comments)    "Nardil reaction-With epi?" "Allergic," per MAR  . Nardil [Phenelzine] Other (See Comments)    "Allergic," per MAR  . Morphine And Related Other (See Comments)    Reacts with nardil- "Allergic," per MAR  . Ondansetron Other (See Comments)    Made patient want to climb the walls.  . Shellfish Allergy Hives    Antimicrobials this admission: Zosyn 4/30 >>  Vancomycin 4/30 >>   Dose adjustments this admission:   Microbiology results: 4/30 BCx:  4/30: Heel wound: GPC and GPR   [name] [name], PharmD, BCPS, FNKF Clinical Pharmacist [place] Please utilize Amion for appropriate phone number to reach the unit pharmacist [facility])

## 2019-11-19 NOTE — Consult Note (Signed)
ORTHOPAEDIC CONSULTATION  REQUESTING PHYSICIAN: Maretta Bees, MD  Chief Complaint: Necrotic ulcer plantar aspect right heel with cellulitis of the distal leg.  HPI: Eric [name] is a 74 y.o. male who presents with a chronic ulcer plantar aspect right calcaneus.  Patient has dementia peripheral vascular disease and a history of A. fib.  Past Medical History:  Diagnosis Date  . Acute respiratory failure with hypoxia (HCC)    multiple prior admissions to [facility] and [facility] for CHF exacerbation / respiratory failure  . Anxiety and depression   . Atrial fibrillation (HCC) 07/25/2018   new-onset during [facility] admission in Jan 2020  . Cataract   . CHF (congestive heart failure) (HCC)    EF <= 50% as of Jan 2020  . Current use of long term anticoagulation 07/2018   Started on apixaban for new-onset a-fib in Jan 2020  . Dementia (HCC)    possible, likely age-related, documented decreased cognitive function on prior hospitalizations  . Gallstones   . Glaucoma   . Hypertension   . Hypoglycemia   . Hypothyroidism   . Inguinal hernia   . Sleep apnea   . Thyroid disease    Past Surgical History:  Procedure Laterality Date  . FINGER AMPUTATION Right    Social History   Socioeconomic History  . Marital status: Divorced    Spouse name: Not on file  . Number of children: 3  . Years of education: Not on file  . Highest education level: Not on file  Occupational History  . Not on file  Tobacco Use  . Smoking status: Former Smoker    Quit date: 10/18/2010    Years since quitting: 9.0  . Smokeless tobacco: Never Used  Substance and Sexual Activity  . Alcohol use: No  . Drug use: No  . Sexual activity: Not on file  Other Topics Concern  . Not on file  Social History Narrative  . Not on file   Social Determinants of Health   Financial Resource Strain:   . Difficulty of Paying Living Expenses:   Food Insecurity:   . Worried About Programme researcher, broadcasting/film/video in the Last Year:     . Barista in the Last Year:   Transportation Needs:   . Freight forwarder (Medical):   Marland Kitchen Lack of Transportation (Non-Medical):   Physical Activity:   . Days of Exercise per Week:   . Minutes of Exercise per Session:   Stress:   . Feeling of Stress :   Social Connections:   . Frequency of Communication with Friends and Family:   . Frequency of Social Gatherings with Friends and Family:   . Attends Religious Services:   . Active Member of Clubs or Organizations:   . Attends Banker Meetings:   Marland Kitchen Marital Status:    Family History  Problem Relation Age of Onset  . Alzheimer's disease Mother    - negative except otherwise stated in the family history section Allergies  Allergen Reactions  . Demerol [Meperidine] Other (See Comments)    Will counteract with a drug he is taking causing fatal reaction with nardil  . Epinephrine Other (See Comments)    "Nardil reaction-With epi?" "Allergic," per MAR  . Nardil [Phenelzine] Other (See Comments)    "Allergic," per MAR  . Morphine And Related Other (See Comments)    Reacts with nardil- "Allergic," per MAR  . Ondansetron Other (See Comments)    Made patient  want to climb the walls.  . Shellfish Allergy Hives   Prior to Admission medications   Medication Sig Start Date End Date Taking? Authorizing Provider  acetaminophen (TYLENOL) 325 MG tablet Take 2 tablets (650 mg total) by mouth every 6 (six) hours as needed for mild pain (or Fever >/= 101). Patient taking differently: Take 650 mg by mouth every 6 (six) hours as needed for mild pain or headache (fever >/= 101, or minor discomfort).  11/03/18  Yes Wieting, Richard, MD  albuterol (ACCUNEB) 0.63 MG/3ML nebulizer solution Take 1 ampule by nebulization every 6 (six) hours as needed for shortness of breath.   Yes [provider]  alum & mag hydroxide-simeth (ANTACID) 200-200-20 MG/5ML suspension Take 30 mLs by mouth 4 (four) times daily as needed for  indigestion or heartburn.   Yes [provider]  diazepam (VALIUM) 2 MG tablet Take 1-2 mg by mouth See admin instructions. Take 1 mg by mouth in the morning and 2 mg at bedtime   Yes [provider]  diphenhydrAMINE (DIPHENHIST) 25 MG tablet Take 25 mg by mouth every 6 (six) hours as needed (for allergic reactions).   Yes [provider]  furosemide (LASIX) 40 MG tablet Take 40 mg by mouth 2 (two) times daily.   Yes [provider]  guaiFENesin (ROBITUSSIN) 100 MG/5ML liquid Take 300 mg by mouth every 6 (six) hours as needed for cough.   Yes [provider]  levothyroxine (SYNTHROID) 175 MCG tablet Take 175 mcg by mouth daily before breakfast.   Yes [provider]  liver oil-zinc oxide (DESITIN) 40 % ointment Apply topically daily. Patient taking differently: Apply 1 application topically See admin instructions. Apply to buttocks once daily 11/04/18  Yes Wieting, Richard, MD  loperamide (IMODIUM A-D) 2 MG tablet Take 4 mg by mouth every 3 (three) hours as needed for diarrhea or loose stools (CANNOT EXCEED 8 DOSES/24 HOURS).   Yes [provider]  magnesium hydroxide (MILK OF MAGNESIA) 400 MG/5ML suspension Take 30 mLs by mouth daily as needed for mild constipation.   Yes [provider]  metoCLOPramide (REGLAN) 5 MG tablet Take 5 mg by mouth every 6 (six) hours as needed for nausea or vomiting.   Yes [provider]  Multiple Vitamins-Minerals (THERATRUM COMPLETE) TABS Take 1 tablet by mouth daily with breakfast.   Yes [provider]  nystatin cream (MYCOSTATIN) Apply 1 application topically See admin instructions. Apply to right lower leg 2 times a week   Yes [provider]  OLANZapine (ZYPREXA) 5 MG tablet Take 1 tablet (5 mg total) by mouth once as needed (agitation). Patient taking differently: Take 5 mg by mouth daily as needed (for agitation).  06/09/19  Yes Lord, Herminio Heads, NP  OLANZapine zydis  (ZYPREXA) 10 MG disintegrating tablet Take 1 tablet (10 mg total) by mouth 2 (two) times daily. 11/03/18  Yes Wieting, Richard, MD  OXYGEN Inhale 2 L/min into the lungs See admin instructions. "2 L/min, as tolerated"   Yes [provider]  sertraline (ZOLOFT) 25 MG tablet Take 1 tablet (25 mg total) by mouth daily. 06/10/19  Yes Charm Rings, NP  Skin Protectants, Misc. (EUCERIN) cream Apply 1 application topically See admin instructions. Apply to both legs 2 times a day   Yes [provider]  diazepam (VALIUM) 10 MG tablet Take 1 tablet (10 mg total) by mouth every 6 (six) hours as needed for anxiety. Patient not taking: Reported on 11/16/2019 11/03/18  Loletha Grayer, MD  furosemide (LASIX) 20 MG tablet Take 1 tablet (20 mg total) by mouth daily. Patient not taking: Reported on 11/16/2019 11/04/18   Loletha Grayer, MD  levothyroxine (SYNTHROID) 150 MCG tablet Take 1 tablet (150 mcg total) by mouth daily. Patient not taking: Reported on 11/16/2019 11/03/18 11/16/19  Loletha Grayer, MD  metoprolol succinate (TOPROL-XL) 25 MG 24 hr tablet Take 1 tablet (25 mg total) by mouth daily. Patient not taking: Reported on 11/16/2019 11/04/18   Loletha Grayer, MD  mupirocin ointment Drue Stager) 2 % Apply to affected area 3 times daily Patient not taking: Reported on 11/16/2019 07/22/19 07/21/20  Earleen Newport, MD  predniSONE (DELTASONE) 50 MG tablet Take 1 tablet by mouth daily Patient not taking: Reported on 11/16/2019 07/22/19   Earleen Newport, MD  sulfamethoxazole-trimethoprim (BACTRIM DS) 800-160 MG tablet Take 1 tablet by mouth 2 (two) times daily. Patient not taking: Reported on 11/16/2019 07/22/19   Earleen Newport, MD   CT FOOT RIGHT WO CONTRAST  Result Date: 11/18/2019 CLINICAL DATA:  Right leg soft tissue infection/cellulitis with heel ulceration. EXAM: CT OF THE RIGHT FOOT WITHOUT CONTRAST TECHNIQUE: Multidetector CT imaging of the right foot was performed according to  the standard protocol. Multiplanar CT image reconstructions were also generated. COMPARISON:  Foot radiographs 11/16/2019 FINDINGS: Bones/Joint/Cartilage No bony destructive findings to suggest active osteomyelitis. Mild bony demineralization. Ligaments Suboptimally assessed by CT. Muscles and Tendons Probable distal tibialis posterior tendinopathy. Flexor hallucis longus tenosynovitis in the vicinity of the knot of Henry. Soft tissues Subcutaneous edema circumferentially around the ankle and tracking in the dorsum of the foot and to a lesser extent along the heel. There is a heel ulceration with the crater extending into the superficial subcutaneous tissues but not into the deep subcutaneous tissues or to the level of the plantar fascia; there appears to be about 1.6 cm of subcutaneous adipose tissue between the deep margin of this ulcer crater and the plantar fascia superficial fascia margin. I do not discern a drainable abscess. No tibiotalar joint effusion. IMPRESSION: 1. Subcutaneous edema circumferentially around the ankle and tracking in the dorsum of the foot and to a lesser extent along the heel, compatible with cellulitis. No drainable abscess identified. 2. Plantar heel ulceration, with or out 1.6 cm of subcutaneous adipose tissue between deep margin of the ulcer crater and plantar fascia. 3. Probable distal tibialis posterior tendinopathy. Flexor hallucis longus tenosynovitis in the vicinity of the knot of Henry. 4. No bony destructive findings to suggest active osteomyelitis. Electronically Signed   By: Van Clines M.D.   On: 11/18/2019 16:06   VAS [place] ABI WITH/WO TBI  Result Date: 11/18/2019 LOWER EXTREMITY DOPPLER STUDY Indications: Ulceration, and gangrene. High Risk Factors: Hypertension.  Limitations: Today's exam was limited due to altered mental status, skin texture              and involuntary patient movement. Comparison Study: No prior study Performing Technologist: Sharion Dove RVS   Examination Guidelines: A complete evaluation includes at minimum, Doppler waveform signals and systolic blood pressure reading at the level of bilateral brachial, anterior tibial, and posterior tibial arteries, when vessel segments are accessible. Bilateral testing is considered an integral part of a complete examination. Photoelectric Plethysmograph (PPG) waveforms and toe systolic pressure readings are included as required and additional duplex testing as needed. Limited examinations for reoccurring indications may be performed as noted.  ABI Findings: +---------+------------------+-----+--------+----------------------+ Right    Rt Pressure (mmHg)IndexWaveformComment                +---------+------------------+-----+--------+----------------------+  Brachial                                pt positioning and AMS +---------+------------------+-----+--------+----------------------+ PTA      139               1.40 biphasic                       +---------+------------------+-----+--------+----------------------+ DP       110               1.11 biphasic                       +---------+------------------+-----+--------+----------------------+ Great Toe50                0.51                                +---------+------------------+-----+--------+----------------------+ +---------+------------------+-----+--------+-----------------------------+ Left     Lt Pressure (mmHg)IndexWaveformComment                       +---------+------------------+-----+--------+-----------------------------+ Brachial 99                                                           +---------+------------------+-----+--------+-----------------------------+ PTA                                     pt refusal to keep foot still +---------+------------------+-----+--------+-----------------------------+ DP       143               1.44 biphasic                               +---------+------------------+-----+--------+-----------------------------+ Great Toe85                0.86                                       +---------+------------------+-----+--------+-----------------------------+ +-------+-----------+-----------+------------+------------+ ABI/TBIToday's ABIToday's TBIPrevious ABIPrevious TBI +-------+-----------+-----------+------------+------------+ Right  1.4        0.51                                +-------+-----------+-----------+------------+------------+ Left   1.4        0.86                                +-------+-----------+-----------+------------+------------+ Arterial wall calcification precludes accurate ankle pressures and ABIs.  Summary: Right: Resting right ankle-brachial index indicates noncompressible right lower extremity arteries. The right toe-brachial index is abnormal. Left: Resting left ankle-brachial index indicates noncompressible left lower extremity arteries. The left toe-brachial index is normal.  *See table(s) above for measurements and observations.     Preliminary    - pertinent xrays, CT, MRI studies were reviewed and independently interpreted  Positive ROS: All other  systems have been reviewed and were otherwise negative with the exception of those mentioned in the HPI and as above.  Physical Exam: General: Patient is confused and disoriented. Psychiatric: Patient is not competent for consent with agitated mood and affect Lymphatic: No axillary or cervical lymphadenopathy Cardiovascular: No pedal edema Respiratory: No cyanosis, no use of accessory musculature GI: No organomegaly, abdomen is soft and non-tender    Images:  @ENCIMAGES @  Labs:  Lab Results  Component Value Date   ESRSEDRATE 45 (H) 11/18/2019   ESRSEDRATE 44 (H) 11/16/2019   CRP 2.6 (H) 11/18/2019   CRP 2.5 (H) 11/17/2019   CRP 1.1 (H) 11/16/2019   REPTSTATUS PENDING 11/16/2019   GRAMSTAIN  11/16/2019    NO WBC  SEEN MODERATE GRAM POSITIVE RODS FEW GRAM POSITIVE COCCI    CULT  11/16/2019    CULTURE REINCUBATED FOR BETTER GROWTH Performed at [facility], [address], [place], [place]    LABORGA ESCHERICHIA COLI (A) 05/11/2019    Lab Results  Component Value Date   ALBUMIN 2.2 (L) 11/18/2019   ALBUMIN 3.5 06/08/2019   ALBUMIN 3.7 01/31/2019   PREALBUMIN 14.3 (L) 10/30/2018    Neurologic: Patient does not have protective sensation bilateral lower extremities.   MUSCULOSKELETAL:   Skin: Examination patient has cellulitis of the distal leg.  There is a ulcer on the plantar aspect of the right calcaneus with necrotic tissue at the wound bed.  Patient's leg and ulcer are tender to palpation.  Patient does have palpable pulses.  Review of the ankle-brachial indices shows calcified vessels with biphasic flow with ABI greater than 1.  The MRI scan was canceled the CT scan does not show definite osteomyelitis.  No signs of abscess.  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  Plan: Plan: With patient's multiple medical problems and dementia he is not a good surgical candidate.  Recommend long-term oral antibiotics such as doxycycline.  I will write orders for Santyl dressing wound care changes and a PRAFO boot to be worn at all times to unload the ulcerative area.  I will follow-up in the office.  Thank you for the consult and the opportunity to see Mr. Kwadwo Taras, MD [facility] [phone] 8:00 AM

## 2019-11-20 DIAGNOSIS — E039 Hypothyroidism, unspecified: Secondary | ICD-10-CM | POA: Diagnosis not present

## 2019-11-20 DIAGNOSIS — L03115 Cellulitis of right lower limb: Secondary | ICD-10-CM | POA: Diagnosis not present

## 2019-11-20 DIAGNOSIS — F419 Anxiety disorder, unspecified: Secondary | ICD-10-CM | POA: Diagnosis not present

## 2019-11-20 DIAGNOSIS — I480 Paroxysmal atrial fibrillation: Secondary | ICD-10-CM | POA: Diagnosis not present

## 2019-11-20 LAB — BASIC METABOLIC PANEL
Anion gap: 6 (ref 5–15)
BUN: 18 mg/dL (ref 8–23)
CO2: 28 mmol/L (ref 22–32)
Calcium: 8 mg/dL — ABNORMAL LOW (ref 8.9–10.3)
Chloride: 106 mmol/L (ref 98–111)
Creatinine, Ser: 0.98 mg/dL (ref 0.61–1.24)
GFR calc Af Amer: >60 mL/min (ref >60)
GFR calc non Af Amer: >60 mL/min (ref >60)
Glucose, Bld: 106 mg/dL — ABNORMAL HIGH (ref 70–99)
Potassium: 4.1 mmol/L (ref 3.5–5.1)
Sodium: 140 mmol/L (ref 135–145)

## 2019-11-20 LAB — SAR COV2 SEROLOGY (COVID19)AB(IGG),IA: SARS-CoV-2 Ab, IgG: REACTIVE — AB

## 2019-11-20 MED ORDER — FUROSEMIDE 40 MG PO TABS
40.0000 mg | ORAL_TABLET | Freq: Every day | ORAL | Status: DC
  Administered 2019-11-21: 40 mg via ORAL
  Filled 2019-11-20 (×2): qty 1

## 2019-11-20 NOTE — TOC Progression Note (Addendum)
Transition of Care [facility]) - Progression Note    Patient Details  Name: Eric [name] MRN: [id number] Date of Birth: Jun 02, 1946  [facility] CM/SW Contact  Mearl Latin, LCSW Phone Number: 11/20/2019, 12:30 PM  Clinical Narrative:    12:30pm-CSW spoke with patient's DSS Guardian, Carly. CSW presented SNF bed offers. She selected [facility]. CSW awaiting response from [facility] with bed availability.   3:45pm-[facility] able to accept patient tomorrow. CSW made Carly aware and SNF will contact her to complete paperwork.   Expected Discharge Plan: Skilled Nursing Facility Barriers to Discharge: SNF Pending bed offer  Expected Discharge Plan and Services Expected Discharge Plan: Skilled Nursing Facility In-house Referral: Clinical Social Work   Post Acute Care Choice: Skilled Nursing Facility Living arrangements for the past 2 months: Assisted Living Facility                                       Social Determinants of Health (SDOH) Interventions    Readmission Risk Interventions Readmission Risk Prevention Plan 11/03/2018 10/31/2018  Transportation Screening Complete Complete  PCP or Specialist Appt within 5-7 Days Complete -  Home Care Screening Complete Complete  Medication Review (RN CM) Complete -  Some recent data might be hidden

## 2019-11-20 NOTE — Progress Notes (Signed)
PROGRESS NOTE                                                                                                                                                                                                             Patient Demographics:    Eric [name], is a 73 y.o. male, DOB - 01/08/1946, ZOX:[id number]  Outpatient Primary MD for the patient is System, Pcp Not In   Admit date - 11/16/2019   LOS - 4  Chief Complaint  Patient presents with  . Wound Infection       Brief Narrative: Patient is a 74 y.o. male with PMHx of PAF, HTN, hypothyroidism, chronic combined systolic/diastolic heart failure, dementia-who was brought from SNF for concern of right foot/heel ulcer with infection.  Significant Events: 4/30>> admit to [facility] from SNF-with right foot cellulitis/wound infection.  COVID-19 medications: None  Antibiotics: Doxycycline: 5/3>> Vancomycin: 4/30>>5/3 Zosyn: 4/30>>5/3  Microbiology data: 4/30: Blood culture>> negative so far 4/30: Right heel wound swab>> no growth so far   DVT prophylaxis: SQ Lovenox  Procedures: None  Consults: Ortho   Subjective:   Pleasantly confused-answer some questions appropriately.   Assessment  & Plan :   Right leg soft tissue infection/cellulitis with heel ulceration: Erythema/swelling improved-concern for underlying osteomyelitis-unfortunately patient not cooperative for MRI-subsequently underwent a CT scan foot which showed no evidence of osteomyelitis.  ABI with noncompressible right/left lower vessels-right toe brachial index was abnormal, however left toe brachial index was normal.  Evaluated by Dr. Fidela Juneau are for doxycycline, wound care dressing with Santyl-and a PRAFO boot  Chronic combined systolic and diastolic heart failure: Euvolemic-no echocardiogram on file.  Now that BP is more stable-resume diuretics.  ?  Chronic hypoxic respiratory failure:  SNF medication lists O2 2 L/min as needed-suspect that he probably has some amount of chronic hypoxic respiratory failure from CHF.  He does not have cough or shortness of breath-to suggest a acute component to chronic hypoxic respiratory failure.  HTN: Blood pressure now stable-resuming diuretics.  History of PAF: Sinus rhythm-suspect not on anticoagulation given advanced dementia/poor candidate.   Hypothyroidism: TSH elevated at 27.0-not sure when dosage of levothyroxine adjusted last-levothyroxine increased to 200 mcg-repeat TSH in 3-4 weeks.    Dementia with delirium: Somewhat confused last night-at risk for worsening delirium-continue olanzapine, Zoloft, and Valium.  History of COVID-19 infection: Social  work confirmed that patient had a positive COVID-19 test on August 03, 2019.  He does not have any signs of active infection-suspect that this is a residue of his prior infection.  I spoke with the microbiology lab-CT threshold for PCR is 38.5-which is in the noninfectious range.  I spoke with Dr. Doristine ChurchSnider-okay to discontinue isolation.  IgG 4 SARS-CoV-2 reactive-confirming prior infection.     Fever: afebrile  O2 requirements:  SpO2: 96 % O2 Flow Rate (L/min): 2 L/min   COVID-19 Labs: Recent Labs    11/17/19 1210 11/18/19 0538  CRP 2.5* 2.6*       Component Value Date/Time   BNP 21.0 06/20/2019 1500    No results for input(s): PROCALCITON in the last 168 hours.  Lab Results  Component Value Date   SARSCOV2NAA POSITIVE (A) 11/16/2019   SARSCOV2NAA NEGATIVE 01/31/2019   SARSCOV2NAA NEGATIVE 01/18/2019   SARSCOV2NAA NEGATIVE 11/15/2018      Obesity: Estimated body mass index is 29.98 kg/m as calculated from the following:   Height as of 07/22/19: 5\' 9"  (1.753 m).   Weight as of this encounter: 92.1 kg.   RN pressure injury documentation: Pressure Injury 11/18/18 Stage II -  Partial thickness loss of dermis presenting as a shallow open ulcer with a red, pink wound bed  without slough. (Active)  11/18/18 0956  Location: Buttocks  Location Orientation: Right;Lower  Staging: Stage II -  Partial thickness loss of dermis presenting as a shallow open ulcer with a red, pink wound bed without slough.  Wound Description (Comments):   Present on Admission:     ABG: No results found for: PHART, PCO2ART, PO2ART, HCO3, TCO2, ACIDBASEDEF, O2SAT  Vent Settings: N/  Condition - Stable  Family Communication  : Called son at [phone]-unable to leave message-on 5/3.  Per social work-this patient has a legal guardian-she is awaiting to hear from him.  Code Status :  Full Code  Diet :  Diet Order            Diet regular Room service appropriate? Yes; Fluid consistency: Thin  Diet effective now               Disposition Plan  :   Status is: Inpatient  Remains inpatient appropriate because:Inpatient level of care appropriate due to severity of illness  Dispo: The patient is from: SNF              Anticipated d/c is to: SNF              Anticipated d/c date is: > 3 days              Patient currently is not medically stable to d/c.   Barriers to discharge: Awaiting SNF  Antimicorbials  :    Anti-infectives (From admission, onward)   Start     Dose/Rate Route Frequency Ordered Stop   11/19/19 1500  doxycycline (VIBRA-TABS) tablet 100 mg     100 mg Oral Every 12 hours 11/19/19 1457     11/17/19 0600  vancomycin (VANCOREADY) IVPB 750 mg/150 mL  Status:  Discontinued     750 mg 150 mL/hr over 60 Minutes Intravenous Every 12 hours 11/16/19 1742 11/19/19 1457   11/17/19 0000  piperacillin-tazobactam (ZOSYN) IVPB 3.375 g  Status:  Discontinued     3.375 g 12.5 mL/hr over 240 Minutes Intravenous Every 8 hours 11/16/19 1742 11/19/19 1457   11/16/19 1800  vancomycin (VANCOREADY) IVPB 2000 mg/400 mL  2,000 mg 200 mL/hr over 120 Minutes Intravenous  Once 11/16/19 1737 11/17/19 0041   11/16/19 1745  piperacillin-tazobactam (ZOSYN) IVPB 3.375 g  Status:   Discontinued     3.375 g 100 mL/hr over 30 Minutes Intravenous  Once 11/16/19 1732 11/16/19 1732   11/16/19 1745  vancomycin (VANCOCIN) IVPB 1000 mg/200 mL premix  Status:  Discontinued     1,000 mg 200 mL/hr over 60 Minutes Intravenous  Once 11/16/19 1732 11/16/19 1732   11/16/19 1630  piperacillin-tazobactam (ZOSYN) IVPB 3.375 g     3.375 g 100 mL/hr over 30 Minutes Intravenous  Once 11/16/19 1629 11/16/19 1754      Inpatient Medications  Scheduled Meds: . collagenase   Topical Daily  . diazepam  1 mg Oral Daily  . diazepam  2 mg Oral QHS  . doxycycline  100 mg Oral Q12H  . enoxaparin (LOVENOX) injection  40 mg Subcutaneous Q24H  . levothyroxine  175 mcg Oral QAC breakfast  . OLANZapine zydis  10 mg Oral BID  . sertraline  25 mg Oral Daily   Continuous Infusions:  PRN Meds:.acetaminophen **OR** acetaminophen, albuterol, haloperidol lactate, OLANZapine   Time Spent in minutes  25  See all Orders from today for further details   Jeoffrey Massed M.D on 11/20/2019 at 11:26 AM  To page go to [email or web address] - use universal password  [facility] -  Office  [phone]    Objective:   Vitals:   11/19/19 1300 11/19/19 2112 11/20/19 0223 11/20/19 0522  BP: 119/60 119/65  132/72  Pulse:  75 81 82  Resp:  Temp: 97.6 F (36.4 C) 98.1 F (36.7 C)  98 F (36.7 C)  TempSrc: Oral Oral  Oral  SpO2:  98% 93% 96%  Weight:        Wt Readings from Last 3 Encounters:  11/18/19 92.1 kg  07/22/19 104.3 kg  06/20/19 90.7 kg     Intake/Output Summary (Last 24 hours) at 11/20/2019 1126 Last data filed at 11/20/2019 0622 Gross per 24 hour  Intake 240 ml  Output 800 ml  Net -560 ml     Physical Exam Gen Exam: Mildly confused but mostly awake and alert-not in any distress.   HEENT:atraumatic, normocephalic Chest: B/L clear to auscultation anteriorly CVS:S1S2 regular Abdomen:soft non tender, non distended Extremities:no edema Neurology: Difficult exam given  some confusion but seems to be moving all 4 extremities. Skin: no rash   Data Review:    CBC Recent Labs  Lab 11/16/19 1517 11/16/19 1733 11/18/19 0538 11/19/19 1417  WBC 18.7* 16.8* 9.6 6.9  HGB 14.5 13.6 12.5* 13.4  HCT 43.5 42.0 38.7* 40.9  PLT 271 239 203 160  MCV 91.8 94.4 93.0 93.0  MCH 30.6 30.6 30.0 30.5  MCHC 33.3 32.4 32.3 32.8  RDW 13.6 13.5 13.8 13.6  LYMPHSABS 0.5*  --   --   --   MONOABS 1.4*  --   --   --   EOSABS 0.1  --   --   --   BASOSABS 0.1  --   --   --     Chemistries  Recent Labs  Lab 11/16/19 1517 11/16/19 1733 11/18/19 0538 11/19/19 0541 11/20/19 0552  NA 133*  --  139 139 140  K 4.6  --  3.8 4.5 4.1  CL 103  --  105 108 106  CO2 20*  --  GLUCOSE 124*  --  134* 98  106*  BUN 27*  --  18 16 18   CREATININE 1.15 1.24 1.03 1.08 0.98  CALCIUM 8.1*  --  7.9* 7.9* 8.0*  MG  --  2.3  --   --   --   AST  --   --  17  --   --   ALT  --   --  15  --   --   ALKPHOS  --   --  51  --   --   BILITOT  --   --  0.3  --   --    ------------------------------------------------------------------------------------------------------------------ No results for input(s): CHOL, HDL, LDLCALC, TRIG, CHOLHDL, LDLDIRECT in the last 72 hours.  No results found for: HGBA1C ------------------------------------------------------------------------------------------------------------------ Recent Labs    11/17/19 1210  TSH 27.010*   ------------------------------------------------------------------------------------------------------------------ No results for input(s): VITAMINB12, FOLATE, FERRITIN, TIBC, IRON, RETICCTPCT in the last 72 hours.  Coagulation profile No results for input(s): INR, PROTIME in the last 168 hours.  No results for input(s): DDIMER in the last 72 hours.  Cardiac Enzymes No results for input(s): CKMB, TROPONINI, MYOGLOBIN in the last 168 hours.  Invalid input(s):  CK ------------------------------------------------------------------------------------------------------------------    Component Value Date/Time   BNP 21.0 06/20/2019 1500    Micro Results Recent Results (from the past 240 hour(s))  Blood culture (routine x 2)     Status: None (Preliminary result)   Collection Time: 11/16/19  4:45 PM   Specimen: BLOOD LEFT FOREARM  Result Value Ref Range Status   Specimen Description BLOOD LEFT FOREARM  Final   Special Requests   Final    BOTTLES DRAWN AEROBIC AND ANAEROBIC Blood Culture results may not be optimal due to an inadequate volume of blood received in culture bottles   Culture   Final    NO GROWTH 4 DAYS Performed at [facility], [place] [address] [place], [place] [address]    Report Status PENDING  Incomplete  Blood culture (routine x 2)     Status: None (Preliminary result)   Collection Time: 11/16/19  4:45 PM   Specimen: BLOOD  Result Value Ref Range Status   Specimen Description BLOOD LEFT ANTECUBITAL  Final   Special Requests   Final    BOTTLES DRAWN AEROBIC ONLY Blood Culture results may not be optimal due to an inadequate volume of blood received in culture bottles   Culture   Final    NO GROWTH 4 DAYS Performed at [facility], [place] [address], [place], [place] [address]    Report Status PENDING  Incomplete  Respiratory Panel by RT PCR (Flu A&B, Covid) - Nasopharyngeal Swab     Status: Abnormal   Collection Time: 11/16/19  6:32 PM   Specimen: Nasopharyngeal Swab  Result Value Ref Range Status   SARS Coronavirus 2 by RT PCR POSITIVE (A) NEGATIVE Final    Comment: RESULT CALLED TO, READ BACK BY AND VERIFIED WITH: Chalmers Cater RN 11/16/19 1955 JDW (NOTE) SARS-CoV-2 target nucleic acids are DETECTED. SARS-CoV-2 RNA is generally detectable in upper respiratory specimens  during the acute phase of infection. Positive results are indicative of the presence of the identified virus, but do not rule out bacterial infection  or co-infection with other pathogens not detected by the test. Clinical correlation with patient history and other diagnostic information is necessary to determine patient infection status. The expected result is Negative. Fact Sheet for Patients:  PinkCheek.be Fact Sheet for Healthcare Providers: GravelBags.it This test is not yet approved  or cleared by the [place] and  has been authorized for detection and/or diagnosis of SARS-CoV-2 by FDA under an Emergency Use Authorization (EUA).  This EUA will remain in effect (meaning this test can be used) for the  duration of  the COVID-19 declaration under Section 564(b)(1) of the Act, 21 U.S.C. section 360bbb-3(b)(1), unless the authorization is terminated or revoked sooner.    Influenza A by PCR NEGATIVE NEGATIVE Final   Influenza B by PCR NEGATIVE NEGATIVE Final    Comment: (NOTE) The Xpert Xpress SARS-CoV-2/FLU/RSV assay is intended as an aid in  the diagnosis of influenza from Nasopharyngeal swab specimens and  should not be used as a sole basis for treatment. Nasal washings and  aspirates are unacceptable for Xpert Xpress SARS-CoV-2/FLU/RSV  testing. Fact Sheet for Patients: https://www.moore.com/ Fact Sheet for Healthcare Providers: https://www.young.biz/ This test is not yet approved or cleared by the [place] FDA and  has been authorized for detection and/or diagnosis of SARS-CoV-2 by  FDA under an Emergency Use Authorization (EUA). This EUA will remain  in effect (meaning this test can be used) for the duration of the  Covid-19 declaration under Section 564(b)(1) of the Act, 21  U.S.C. section 360bbb-3(b)(1), unless the authorization is  terminated or revoked. Performed at [facility], [address], [place], [place] [address]   Wound or Superficial Culture     Status: None (Preliminary result)   Collection  Time: 11/16/19  8:46 PM   Specimen: Heel; Wound  Result Value Ref Range Status   Specimen Description HEEL  Final   Special Requests NONE  Final   Gram Stain   Final    NO WBC SEEN MODERATE GRAM POSITIVE RODS FEW GRAM POSITIVE COCCI    Culture   Final    FEW STAPHYLOCOCCUS AUREUS SUSCEPTIBILITIES TO FOLLOW Performed at [facility], [address] [place], [place] [address]    Report Status PENDING  Incomplete    Radiology Reports DG Orbits  Result Date: 11/17/2019 CLINICAL DATA:  MRI screening. EXAM: ORBITS - COMPLETE 4+ VIEW COMPARISON:  None. FINDINGS: There is no evidence of fracture or other significant bone abnormality. A 1.03 mm calcified opacity is seen within the soft tissues just above the region of the zygomatic arch on the right. No orbital emphysema or sinus air-fluid levels are seen. No radiopaque foreign bodies are identified. IMPRESSION: No evidence of an acute abnormality or radiopaque foreign body. Electronically Signed   By: Aram Candela M.D.   On: 11/17/2019 03:55   DG Abd 1 View  Result Date: 11/17/2019 CLINICAL DATA:  MRI screening. EXAM: ABDOMEN - 1 VIEW COMPARISON:  None. FINDINGS: The bowel gas pattern is normal. A large amount of stool is seen within the ascending colon. No radio-opaque calculi or other significant radiographic abnormality are seen. No radiopaque foreign bodies are identified. IMPRESSION: 1. No evidence of radiopaque foreign body. 2. Large stool burden. Electronically Signed   By: Aram Candela M.D.   On: 11/17/2019 03:57   CT FOOT RIGHT WO CONTRAST  Result Date: 11/18/2019 CLINICAL DATA:  Right leg soft tissue infection/cellulitis with heel ulceration. EXAM: CT OF THE RIGHT FOOT WITHOUT CONTRAST TECHNIQUE: Multidetector CT imaging of the right foot was performed according to the standard protocol. Multiplanar CT image reconstructions were also generated. COMPARISON:  Foot radiographs 11/16/2019 FINDINGS: Bones/Joint/Cartilage No  bony destructive findings to suggest active osteomyelitis. Mild bony demineralization. Ligaments Suboptimally assessed by CT. Muscles and Tendons Probable  distal tibialis posterior tendinopathy. Flexor hallucis longus tenosynovitis in the vicinity of the knot of Henry. Soft tissues Subcutaneous edema circumferentially around the ankle and tracking in the dorsum of the foot and to a lesser extent along the heel. There is a heel ulceration with the crater extending into the superficial subcutaneous tissues but not into the deep subcutaneous tissues or to the level of the plantar fascia; there appears to be about 1.6 cm of subcutaneous adipose tissue between the deep margin of this ulcer crater and the plantar fascia superficial fascia margin. I do not discern a drainable abscess. No tibiotalar joint effusion. IMPRESSION: 1. Subcutaneous edema circumferentially around the ankle and tracking in the dorsum of the foot and to a lesser extent along the heel, compatible with cellulitis. No drainable abscess identified. 2. Plantar heel ulceration, with or out 1.6 cm of subcutaneous adipose tissue between deep margin of the ulcer crater and plantar fascia. 3. Probable distal tibialis posterior tendinopathy. Flexor hallucis longus tenosynovitis in the vicinity of the knot of Henry. 4. No bony destructive findings to suggest active osteomyelitis. Electronically Signed   By: Gaylyn Rong M.D.   On: 11/18/2019 16:06   DG CHEST PORT 1 VIEW  Result Date: 11/17/2019 CLINICAL DATA:  Right foot infection. EXAM: PORTABLE CHEST 1 VIEW COMPARISON:  July 22, 2019 FINDINGS: Very mild, predominant stable scarring and/or atelectasis is seen within the bilateral lung bases. The heart size and mediastinal contours are within normal limits. The visualized skeletal structures are unremarkable. IMPRESSION: Very mild, predominant stable scarring and/or atelectasis in the bilateral lung bases. Electronically Signed   By: Aram Candela M.D.   On: 11/17/2019 03:51   DG Foot 2 Views Right  Result Date: 11/16/2019 CLINICAL DATA:  Right heel ulceration, evaluate for evidence of osteomyelitis EXAM: RIGHT FOOT - 2 VIEW COMPARISON:  08/24/2013 FINDINGS: Osteopenia. No fracture or dislocation. Joint spaces are preserved. Soft tissue ulceration of the plantar heel without evidence of underlying bony erosion or sclerosis. Diffuse soft tissue edema about the foot and ankle. IMPRESSION: 1.  Osteopenia.  No fracture or dislocation of the right foot. 2. Soft tissue ulceration of the plantar heel without evidence of underlying bony erosion or sclerosis. Consider MRI to more sensitively evaluate for bone marrow edema and osteomyelitis if clinically suspected. 3.  Soft tissue edema about the foot and ankle. Electronically Signed   By: Lauralyn Primes M.D.   On: 11/16/2019 15:27   VAS [place] ABI WITH/WO TBI  Result Date: 11/19/2019 LOWER EXTREMITY DOPPLER STUDY Indications: Ulceration, and gangrene. High Risk Factors: Hypertension.  Limitations: Today's exam was limited due to altered mental status, skin texture              and involuntary patient movement. Comparison Study: No prior study Performing Technologist: Sherren Kerns RVS  Examination Guidelines: A complete evaluation includes at minimum, Doppler waveform signals and systolic blood pressure reading at the level of bilateral brachial, anterior tibial, and posterior tibial arteries, when vessel segments are accessible. Bilateral testing is considered an integral part of a complete examination. Photoelectric Plethysmograph (PPG) waveforms and toe systolic pressure readings are included as required and additional duplex testing as needed. Limited examinations for reoccurring indications may be performed as noted.  ABI Findings: +---------+------------------+-----+--------+----------------------+ Right    Rt Pressure (mmHg)IndexWaveformComment                 +---------+------------------+-----+--------+----------------------+ Brachial  pt positioning and AMS +---------+------------------+-----+--------+----------------------+ PTA      139               1.40 biphasic                       +---------+------------------+-----+--------+----------------------+ DP       110               1.11 biphasic                       +---------+------------------+-----+--------+----------------------+ Great Toe50                0.51                                +---------+------------------+-----+--------+----------------------+ +---------+------------------+-----+--------+-----------------------------+ Left     Lt Pressure (mmHg)IndexWaveformComment                       +---------+------------------+-----+--------+-----------------------------+ Brachial 99                                                           +---------+------------------+-----+--------+-----------------------------+ PTA                                     pt refusal to keep foot still +---------+------------------+-----+--------+-----------------------------+ DP       143               1.44 biphasic                              +---------+------------------+-----+--------+-----------------------------+ Great Toe85                0.86                                       +---------+------------------+-----+--------+-----------------------------+ +-------+-----------+-----------+------------+------------+ ABI/TBIToday's ABIToday's TBIPrevious ABIPrevious TBI +-------+-----------+-----------+------------+------------+ Right  1.4        0.51                                +-------+-----------+-----------+------------+------------+ Left   1.4        0.86                                +-------+-----------+-----------+------------+------------+ Arterial wall calcification precludes accurate ankle pressures and ABIs.   Summary: Right: Resting right ankle-brachial index indicates noncompressible right lower extremity arteries. The right toe-brachial index is abnormal. Left: Resting left ankle-brachial index indicates noncompressible left lower extremity arteries. The left toe-brachial index is normal.  *See table(s) above for measurements and observations.  Electronically signed by Fabienne Bruns MD on 11/19/2019 at 3:45:51 PM.    Final

## 2019-11-21 DIAGNOSIS — I10 Essential (primary) hypertension: Secondary | ICD-10-CM

## 2019-11-21 DIAGNOSIS — I480 Paroxysmal atrial fibrillation: Secondary | ICD-10-CM | POA: Diagnosis not present

## 2019-11-21 DIAGNOSIS — L03115 Cellulitis of right lower limb: Secondary | ICD-10-CM | POA: Diagnosis not present

## 2019-11-21 DIAGNOSIS — F419 Anxiety disorder, unspecified: Secondary | ICD-10-CM | POA: Diagnosis not present

## 2019-11-21 LAB — AEROBIC CULTURE W GRAM STAIN (SUPERFICIAL SPECIMEN): Gram Stain: NONE SEEN

## 2019-11-21 LAB — CULTURE, BLOOD (ROUTINE X 2)
Culture: NO GROWTH
Culture: NO GROWTH

## 2019-11-21 MED ORDER — LEVOTHYROXINE SODIUM 200 MCG PO TABS: 200.0000 ug | ORAL_TABLET | Freq: Every day | ORAL | Status: AC

## 2019-11-21 MED ORDER — DOXYCYCLINE HYCLATE 100 MG PO TABS: 100.0000 mg | ORAL_TABLET | Freq: Two times a day (BID) | ORAL | 0 refills | Status: AC

## 2019-11-21 MED ORDER — COLLAGENASE 250 UNIT/GM EX OINT: TOPICAL_OINTMENT | Freq: Every day | CUTANEOUS | 0 refills | Status: DC

## 2019-11-21 MED ORDER — FUROSEMIDE 20 MG PO TABS: 40.0000 mg | ORAL_TABLET | Freq: Every day | ORAL | 0 refills | Status: DC

## 2019-11-21 MED ORDER — ALBUTEROL SULFATE HFA 108 (90 BASE) MCG/ACT IN AERS
2.0000 | INHALATION_SPRAY | Freq: Four times a day (QID) | RESPIRATORY_TRACT | Status: DC | PRN
Start: 2019-11-21 — End: 2020-12-23

## 2019-11-21 MED ORDER — OLANZAPINE 5 MG PO TABS: 5.0000 mg | ORAL_TABLET | Freq: Every day | ORAL | Status: DC | PRN

## 2019-11-21 MED ORDER — DIAZEPAM 2 MG PO TABS: 1.0000 mg | ORAL_TABLET | ORAL | 0 refills | Status: DC

## 2019-11-21 NOTE — Progress Notes (Signed)
[name] III to be D/C'd Skilled nursing facility  [facility]  per MD order.  Discussed with the patient and all questions fully answered.  VSS, Skin clean, dry and intact without evidence of skin break down, no evidence of skin tears noted. IV catheter discontinued intact. Site without signs and symptoms of complications. Dressing and pressure applied.  An After Visit Summary was printed and given to the patient. Patient received prescription.  D/c education completed with patient/family including follow up instructions, medication list, d/c activities limitations if indicated, with other d/c instructions as indicated by MD - patient able to verbalize understanding, all questions fully answered.   Patient instructed to return to ED, call 911, or call MD for any changes in condition.   Patient escorted via [facility] , and D/C to  [facility]. Report Given to nurse [facility] from  [facility] facility.   Conley Canal Montejano 11/21/2019 5:10 PM

## 2019-11-21 NOTE — TOC Transition Note (Signed)
Transition of Care [facility]) - CM/SW Discharge Note   Patient Details  Name: Eric [name] MRN: [id number] Date of Birth: 03/20/1946  Transition of Care [facility]) CM/SW Contact:  Mearl Latin, LCSW Phone Number: 11/21/2019, 12:32 PM   Clinical Narrative:    Patient will DC to: [facility] [place] Anticipated DC date: 11/21/19 Family notified: Kermit Balo [facility] DSS) Transport by: Sharin Mons   Per MD patient ready for DC to [facility]. RN, patient, patient's family, and facility notified of DC. Discharge Summary and FL2 sent to facility. RN to call report prior to discharge [phone] Room 601). DC packet on chart. Ambulance transport requested for patient.   CSW will sign off for now as social work intervention is no longer needed. Please consult [place] again if new needs arise.      Final next level of care: Skilled Nursing Facility Barriers to Discharge: Barriers Resolved   Patient Goals and CMS Choice Patient states their goals for this hospitalization and ongoing recovery are:: Rehab CMS Medicare.gov Compare Post Acute Care list provided to:: Legal Guardian([facility]) Choice offered to / list presented to : [facility] POA / Guardian  Discharge Placement   Existing PASRR number confirmed : 11/21/19          Patient chooses bed at: [facility] [place] Patient to be transferred to facility by: [facility] Name of family member notified: Guardian, Wenda Low [facility]) Patient and family notified of of transfer: 11/21/19  Discharge Plan and Services In-house Referral: Clinical Social Work   Post Acute Care Choice: Skilled Nursing Facility                               Social Determinants of Health (SDOH) Interventions     Readmission Risk Interventions Readmission Risk Prevention Plan 11/03/2018 10/31/2018  Transportation Screening Complete Complete  PCP or Specialist Appt within 5-7 Days Complete -  Home Care Screening Complete Complete  Medication  Review (RN CM) Complete -  Some recent data might be hidden

## 2019-11-21 NOTE — Discharge Summary (Addendum)
PATIENT DETAILS Name: Eric [name] Age: 74 y.o. Sex: male Date of Birth: 1946-05-27 MRN: [id number]. Admitting Physician: Ollen Bowl, MD WUJ:[id number], Pcp Not In  Admit Date: 11/16/2019 Discharge date: 11/21/2019  Recommendations for Outpatient Follow-up:  1. Follow up with PCP in 1-2 weeks 2. Please obtain CMP/CBC in one week 3. Please ensure follow-up with orthopedics-Dr. Lajoyce Corners 4. Repeat TSH in 3 to 4 weeks-levothyroxine dose adjusted during this hospital stay.  Admitted From:  SNF  Disposition: SNF   Home Health: No  Equipment/Devices: None  Discharge Condition: Stable  CODE STATUS: FULL CODE  Diet recommendation:  Diet Order            Diet - low sodium heart healthy        Diet regular Room service appropriate? Yes; Fluid consistency: Thin  Diet effective now              Brief Narrative: Patient is a 74 y.o. male with PMHx of PAF, HTN, hypothyroidism, chronic combined systolic/diastolic heart failure, dementia-who was brought from SNF for concern of right foot/heel ulcer with infection.  Significant Events: 4/30>> admit to [facility] from SNF-with right foot cellulitis/wound infection.  COVID-19 medications: None  Antibiotics: Doxycycline: 5/3>> Vancomycin: 4/30>>5/3 Zosyn: 4/30>>5/3  Microbiology data: 4/30: Blood culture>> MRSA 4/30: Right heel wound swab>> no growth so far   Procedures: None  Consults: Ortho  Brief Hospital Course: Right leg soft tissue infection/cellulitis with heel ulceration: Erythema/swelling improved-concern for underlying osteomyelitis-unfortunately patient not cooperative for MRI-subsequently underwent a CT scan foot which showed no evidence of osteomyelitis.  ABI with noncompressible right/left lower vessels-right toe brachial index was abnormal, however left toe brachial index was normal.  Evaluated by Dr. Fidela Juneau are for doxycycline x 2 weeks, wound care dressing with Santyl-and a PRAFO boot.   Please ensure follow-up with Dr. Lajoyce Corners.  Chronic combined systolic and diastolic heart failure: Euvolemic-no echocardiogram on file.  Now that BP is more stable-continue diuretics.  ?  Chronic hypoxic respiratory failure: SNF medication lists O2 2 L/min as needed-suspect that he probably has some amount of chronic hypoxic respiratory failure from CHF.  He does not have cough or shortness of breath-to suggest a acute component to chronic hypoxic respiratory failure.  HTN: Blood pressure now stable-resuming diuretics.  History of PAF: Sinus rhythm-suspect not on anticoagulation given advanced dementia/poor candidate.   Hypothyroidism: TSH elevated at 27.0-not sure when dosage of levothyroxine adjusted last-levothyroxine increased to 200 mcg-repeat TSH in 3-4 weeks.    Dementia with delirium: Somewhat confused last night-at risk for worsening delirium-continue olanzapine, Zoloft, and Valium.  History of COVID-19 infection: Social work confirmed that patient had a positive COVID-19 test on August 03, 2019.  He does not have any signs of active infection-suspect that this is a residue of his prior infection.  I spoke with the microbiology lab-CT threshold for PCR is 38.5-which is in the noninfectious range.  I spoke with Dr. Doristine Church to discontinue isolation.  IgG 4 SARS-CoV-2 reactive-confirming prior infection   RN pressure injury documentation: Pressure Injury 11/18/18 Stage II -  Partial thickness loss of dermis presenting as a shallow open ulcer with a red, pink wound bed without slough. (Active)  11/18/18 0956  Location: Buttocks  Location Orientation: Right;Lower  Staging: Stage II -  Partial thickness loss of dermis presenting as a shallow open ulcer with a red, pink wound bed without slough.  Wound Description (Comments):   Present on Admission:     Discharge Diagnoses:  Principal  Problem:   Cellulitis of right foot Active Problems:   Hypothyroidism   PAF (paroxysmal  atrial fibrillation) (HCC)   HTN (hypertension)   Anxiety and depression   Right foot ulcer (HCC)   Cellulitis of right lower extremity   Severe protein-calorie malnutrition [facility])   Discharge Instructions:  Activity:  As tolerated with Full fall precautions use walker/cane & assistance as needed   Discharge Instructions    Call MD for:  redness, tenderness, or signs of infection (pain, swelling, redness, odor or green/yellow discharge around incision site)   Complete by: As directed    Diet - low sodium heart healthy   Complete by: As directed    Increase activity slowly   Complete by: As directed      Allergies as of 11/21/2019      Reactions   Demerol [meperidine] Other (See Comments)   Will counteract with a drug he is taking causing fatal reaction with nardil   Epinephrine Other (See Comments)   "Nardil reaction-With epi?" "Allergic," per MAR   Nardil [phenelzine] Other (See Comments)   "Allergic," per MAR   Morphine And Related Other (See Comments)   Reacts with nardil- "Allergic," per MAR   Ondansetron Other (See Comments)   Made patient want to climb the walls.   Shellfish Allergy Hives      Medication List    STOP taking these medications   albuterol 0.63 MG/3ML nebulizer solution Commonly known as: ACCUNEB Replaced by: albuterol 108 (90 Base) MCG/ACT inhaler   levofloxacin 750 MG tablet Commonly known as: LEVAQUIN   metoprolol succinate 25 MG 24 hr tablet Commonly known as: TOPROL-XL   mupirocin ointment 2 % Commonly known as: BACTROBAN   predniSONE 50 MG tablet Commonly known as: DELTASONE   sulfamethoxazole-trimethoprim 800-160 MG tablet Commonly known as: Bactrim DS     TAKE these medications   acetaminophen 325 MG tablet Commonly known as: TYLENOL Take 2 tablets (650 mg total) by mouth every 6 (six) hours as needed for mild pain (or Fever >/= 101). What changed: reasons to take this   albuterol 108 (90 Base) MCG/ACT inhaler Commonly  known as: VENTOLIN HFA Inhale 2 puffs into the lungs every 6 (six) hours as needed for wheezing or shortness of breath. Replaces: albuterol 0.63 MG/3ML nebulizer solution   Antacid 200-200-20 MG/5ML suspension Generic drug: alum & mag hydroxide-simeth Take 30 mLs by mouth 4 (four) times daily as needed for indigestion or heartburn.   collagenase ointment Commonly known as: SANTYL Apply topically daily. Apply to right heel ulcer plus dry dressing change daily Start taking on: Nov 22, 2019   diazepam 2 MG tablet Commonly known as: VALIUM Take 0.5-1 tablets (1-2 mg total) by mouth See admin instructions. Take 1 mg by mouth in the morning and 2 mg at bedtime What changed: Another medication with the same name was removed. Continue taking this medication, and follow the directions you see here.   Diphenhist 25 MG tablet Generic drug: diphenhydrAMINE Take 25 mg by mouth every 6 (six) hours as needed (for allergic reactions).   doxycycline 100 MG tablet Commonly known as: VIBRA-TABS Take 1 tablet (100 mg total) by mouth every 12 (twelve) hours for 14 days.   eucerin cream Apply 1 application topically See admin instructions. Apply to both legs 2 times a day   furosemide 20 MG tablet Commonly known as: LASIX Take 2 tablets (40 mg total) by mouth daily. What changed:   how much to take  Another  medication with the same name was removed. Continue taking this medication, and follow the directions you see here.   guaiFENesin 100 MG/5ML liquid Commonly known as: ROBITUSSIN Take 300 mg by mouth every 6 (six) hours as needed for cough.   levothyroxine 200 MCG tablet Commonly known as: SYNTHROID Take 1 tablet (200 mcg total) by mouth daily before breakfast. What changed:   medication strength  how much to take  Another medication with the same name was removed. Continue taking this medication, and follow the directions you see here.   liver oil-zinc oxide 40 % ointment Commonly  known as: DESITIN Apply topically daily. What changed:   how much to take  when to take this  additional instructions   loperamide 2 MG tablet Commonly known as: IMODIUM A-D Take 4 mg by mouth every 3 (three) hours as needed for diarrhea or loose stools (CANNOT EXCEED 8 DOSES/24 HOURS).   metoCLOPramide 5 MG tablet Commonly known as: REGLAN Take 5 mg by mouth every 6 (six) hours as needed for nausea or vomiting.   Milk of Magnesia 400 MG/5ML suspension Generic drug: magnesium hydroxide Take 30 mLs by mouth daily as needed for mild constipation.   nystatin cream Commonly known as: MYCOSTATIN Apply 1 application topically See admin instructions. Apply to right lower leg 2 times a week   OLANZapine zydis 10 MG disintegrating tablet Commonly known as: ZYPREXA Take 1 tablet (10 mg total) by mouth 2 (two) times daily.   OLANZapine 5 MG tablet Commonly known as: ZYPREXA Take 1 tablet (5 mg total) by mouth daily as needed (for agitation).   OXYGEN Inhale 2 L/min into the lungs See admin instructions. "2 L/min, as tolerated"   sertraline 25 MG tablet Commonly known as: ZOLOFT Take 1 tablet (25 mg total) by mouth daily.   Theratrum Complete Tabs Take 1 tablet by mouth daily with breakfast.       Contact information for follow-up providers    Primary MD. Schedule an appointment as soon as possible for a visit in 2 week(s).        Nadara Mustard, MD. Schedule an appointment as soon as possible for a visit in 2 week(s).   Specialty: Orthopedic Surgery Contact information: [address] [place] [address] [phone]            Contact information for after-discharge care    Destination    [facility] Preferred SNF .   Service: Skilled Nursing Contact information: [address] [place] [address] [phone]                 Allergies  Allergen Reactions  . Demerol [Meperidine] Other (See Comments)    Will  counteract with a drug he is taking causing fatal reaction with nardil  . Epinephrine Other (See Comments)    "Nardil reaction-With epi?" "Allergic," per MAR  . Nardil [Phenelzine] Other (See Comments)    "Allergic," per MAR  . Morphine And Related Other (See Comments)    Reacts with nardil- "Allergic," per MAR  . Ondansetron Other (See Comments)    Made patient want to climb the walls.  . Shellfish Allergy Hives     Other Procedures/Studies: DG Orbits  Result Date: 11/20/2019 CLINICAL DATA:  MRI screening. EXAM: ORBITS - COMPLETE 4+ VIEW COMPARISON:  None. FINDINGS: There is no evidence of fracture or other significant bone abnormality. A 1.03 mm calcified opacity is seen within the soft tissues just above the region of  the zygomatic arch on the right. No orbital emphysema or sinus air-fluid levels are seen. No radiopaque foreign bodies are identified. IMPRESSION: No evidence of an acute abnormality or radiopaque foreign body. Electronically Signed   By: Aram Candela M.D.   On: 11/17/2019 03:55   DG Abd 1 View  Result Date: 11/17/2019 CLINICAL DATA:  MRI screening. EXAM: ABDOMEN - 1 VIEW COMPARISON:  None. FINDINGS: The bowel gas pattern is normal. A large amount of stool is seen within the ascending colon. No radio-opaque calculi or other significant radiographic abnormality are seen. No radiopaque foreign bodies are identified. IMPRESSION: 1. No evidence of radiopaque foreign body. 2. Large stool burden. Electronically Signed   By: Aram Candela M.D.   On: 11/17/2019 03:57   CT FOOT RIGHT WO CONTRAST  Result Date: 11/18/2019 CLINICAL DATA:  Right leg soft tissue infection/cellulitis with heel ulceration. EXAM: CT OF THE RIGHT FOOT WITHOUT CONTRAST TECHNIQUE: Multidetector CT imaging of the right foot was performed according to the standard protocol. Multiplanar CT image reconstructions were also generated. COMPARISON:  Foot radiographs 11/16/2019 FINDINGS: Bones/Joint/Cartilage No  bony destructive findings to suggest active osteomyelitis. Mild bony demineralization. Ligaments Suboptimally assessed by CT. Muscles and Tendons Probable distal tibialis posterior tendinopathy. Flexor hallucis longus tenosynovitis in the vicinity of the knot of Henry. Soft tissues Subcutaneous edema circumferentially around the ankle and tracking in the dorsum of the foot and to a lesser extent along the heel. There is a heel ulceration with the crater extending into the superficial subcutaneous tissues but not into the deep subcutaneous tissues or to the level of the plantar fascia; there appears to be about 1.6 cm of subcutaneous adipose tissue between the deep margin of this ulcer crater and the plantar fascia superficial fascia margin. I do not discern a drainable abscess. No tibiotalar joint effusion. IMPRESSION: 1. Subcutaneous edema circumferentially around the ankle and tracking in the dorsum of the foot and to a lesser extent along the heel, compatible with cellulitis. No drainable abscess identified. 2. Plantar heel ulceration, with or out 1.6 cm of subcutaneous adipose tissue between deep margin of the ulcer crater and plantar fascia. 3. Probable distal tibialis posterior tendinopathy. Flexor hallucis longus tenosynovitis in the vicinity of the knot of Henry. 4. No bony destructive findings to suggest active osteomyelitis. Electronically Signed   By: Gaylyn Rong M.D.   On: 11/18/2019 16:06   DG CHEST PORT 1 VIEW  Result Date: 11/17/2019 CLINICAL DATA:  Right foot infection. EXAM: PORTABLE CHEST 1 VIEW COMPARISON:  July 22, 2019 FINDINGS: Very mild, predominant stable scarring and/or atelectasis is seen within the bilateral lung bases. The heart size and mediastinal contours are within normal limits. The visualized skeletal structures are unremarkable. IMPRESSION: Very mild, predominant stable scarring and/or atelectasis in the bilateral lung bases. Electronically Signed   By: Aram Candela M.D.   On: 11/17/2019 03:51   DG Foot 2 Views Right  Result Date: 11/16/2019 CLINICAL DATA:  Right heel ulceration, evaluate for evidence of osteomyelitis EXAM: RIGHT FOOT - 2 VIEW COMPARISON:  08/24/2013 FINDINGS: Osteopenia. No fracture or dislocation. Joint spaces are preserved. Soft tissue ulceration of the plantar heel without evidence of underlying bony erosion or sclerosis. Diffuse soft tissue edema about the foot and ankle. IMPRESSION: 1.  Osteopenia.  No fracture or dislocation of the right foot. 2. Soft tissue ulceration of the plantar heel without evidence of underlying bony erosion or sclerosis. Consider MRI to more sensitively evaluate for bone marrow edema  and osteomyelitis if clinically suspected. 3.  Soft tissue edema about the foot and ankle. Electronically Signed   By: Lauralyn Primes M.D.   On: 11/16/2019 15:27   VAS [place] ABI WITH/WO TBI  Result Date: 11/19/2019 LOWER EXTREMITY DOPPLER STUDY Indications: Ulceration, and gangrene. High Risk Factors: Hypertension.  Limitations: Today's exam was limited due to altered mental status, skin texture              and involuntary patient movement. Comparison Study: No prior study Performing Technologist: Sherren Kerns RVS  Examination Guidelines: A complete evaluation includes at minimum, Doppler waveform signals and systolic blood pressure reading at the level of bilateral brachial, anterior tibial, and posterior tibial arteries, when vessel segments are accessible. Bilateral testing is considered an integral part of a complete examination. Photoelectric Plethysmograph (PPG) waveforms and toe systolic pressure readings are included as required and additional duplex testing as needed. Limited examinations for reoccurring indications may be performed as noted.  ABI Findings: +---------+------------------+-----+--------+----------------------+ Right    Rt Pressure (mmHg)IndexWaveformComment                 +---------+------------------+-----+--------+----------------------+ Brachial                                pt positioning and AMS +---------+------------------+-----+--------+----------------------+ PTA      139               1.40 biphasic                       +---------+------------------+-----+--------+----------------------+ DP       110               1.11 biphasic                       +---------+------------------+-----+--------+----------------------+ Great Toe50                0.51                                +---------+------------------+-----+--------+----------------------+ +---------+------------------+-----+--------+-----------------------------+ Left     Lt Pressure (mmHg)IndexWaveformComment                       +---------+------------------+-----+--------+-----------------------------+ Brachial 99                                                           +---------+------------------+-----+--------+-----------------------------+ PTA                                     pt refusal to keep foot still +---------+------------------+-----+--------+-----------------------------+ DP       143               1.44 biphasic                              +---------+------------------+-----+--------+-----------------------------+ Great Toe85                0.86                                       +---------+------------------+-----+--------+-----------------------------+ +-------+-----------+-----------+------------+------------+  ABI/TBIToday's ABIToday's TBIPrevious ABIPrevious TBI +-------+-----------+-----------+------------+------------+ Right  1.4        0.51                                +-------+-----------+-----------+------------+------------+ Left   1.4        0.86                                +-------+-----------+-----------+------------+------------+ Arterial wall calcification precludes accurate ankle pressures and ABIs.   Summary: Right: Resting right ankle-brachial index indicates noncompressible right lower extremity arteries. The right toe-brachial index is abnormal. Left: Resting left ankle-brachial index indicates noncompressible left lower extremity arteries. The left toe-brachial index is normal.  *See table(s) above for measurements and observations.  Electronically signed by Fabienne Bruns MD on 11/19/2019 at 3:45:51 PM.    Final      TODAY-DAY OF DISCHARGE:  Subjective:   [name] today has no headache,no chest abdominal pain,no new weakness tingling or numbness, feels much better wants to go home today.   Objective:   Blood pressure 118/65, pulse 60, temperature 97.8 F (36.6 C), temperature source Oral, resp. rate 11, weight 92.1 kg, SpO2 99 %.  Intake/Output Summary (Last 24 hours) at 11/21/2019 1312 Last data filed at 11/21/2019 1017 Gross per 24 hour  Intake 120 ml  Output 400 ml  Net -280 ml   Filed Weights   11/18/19 0530  Weight: 92.1 kg    Exam: Awake Alert, Oriented *3, No new F.N deficits, Normal affect [place].AT,PERRAL Supple Neck,No JVD, No cervical lymphadenopathy appriciated.  Symmetrical Chest wall movement, Good air movement bilaterally, CTAB RRR,No Gallops,Rubs or new Murmurs, No Parasternal Heave +ve B.Sounds, Abd Soft, Non tender, No organomegaly appriciated, No rebound -guarding or rigidity. No Cyanosis, Clubbing or edema, No new Rash or bruise   PERTINENT RADIOLOGIC STUDIES: No results found.   PERTINENT LAB RESULTS: CBC: Recent Labs    11/19/19 1417  WBC 6.9  HGB 13.4  HCT 40.9  PLT 160   CMET CMP     Component Value Date/Time   NA 140 11/20/2019 0552   K 4.1 11/20/2019 0552   CL 106 11/20/2019 0552   CO2 28 11/20/2019 0552   GLUCOSE 106 (H) 11/20/2019 0552   BUN 18 11/20/2019 0552   CREATININE 0.98 11/20/2019 0552   CALCIUM 8.0 (L) 11/20/2019 0552   PROT 5.6 (L) 11/18/2019 0538   ALBUMIN 2.2 (L) 11/18/2019 0538   AST 17 11/18/2019 0538   ALT  15 11/18/2019 0538   ALKPHOS 51 11/18/2019 0538   BILITOT 0.3 11/18/2019 0538   GFRNONAA >60 11/20/2019 0552   GFRAA >60 11/20/2019 0552    GFR Estimated Creatinine Clearance: 75.3 mL/min (by C-G formula based on SCr of 0.98 mg/dL). No results for input(s): LIPASE, AMYLASE in the last 72 hours. No results for input(s): CKTOTAL, CKMB, CKMBINDEX, TROPONINI in the last 72 hours. Invalid input(s): POCBNP No results for input(s): DDIMER in the last 72 hours. No results for input(s): HGBA1C in the last 72 hours. No results for input(s): CHOL, HDL, LDLCALC, TRIG, CHOLHDL, LDLDIRECT in the last 72 hours. No results for input(s): TSH, T4TOTAL, T3FREE, THYROIDAB in the last 72 hours.  Invalid input(s): FREET3 No results for input(s): VITAMINB12, FOLATE, FERRITIN, TIBC, IRON, RETICCTPCT in the last 72 hours. Coags: No results for input(s): INR in the last 72 hours.  Invalid input(s): PT Microbiology: Recent Results (from the past 240 hour(s))  Blood culture (routine x 2)     Status: None   Collection Time: 11/16/19  4:45 PM   Specimen: BLOOD LEFT FOREARM  Result Value Ref Range Status   Specimen Description BLOOD LEFT FOREARM  Final   Special Requests   Final    BOTTLES DRAWN AEROBIC AND ANAEROBIC Blood Culture results may not be optimal due to an inadequate volume of blood received in culture bottles   Culture   Final    NO GROWTH 5 DAYS Performed at [facility], [address] [place], [place] [address]    Report Status 11/21/2019 FINAL  Final  Blood culture (routine x 2)     Status: None   Collection Time: 11/16/19  4:45 PM   Specimen: BLOOD  Result Value Ref Range Status   Specimen Description BLOOD LEFT ANTECUBITAL  Final   Special Requests   Final    BOTTLES DRAWN AEROBIC ONLY Blood Culture results may not be optimal due to an inadequate volume of blood received in culture bottles   Culture   Final    NO GROWTH 5 DAYS Performed at [facility], [address] [place], [place] [address]    Report Status 11/21/2019 FINAL  Final  Respiratory Panel by RT PCR (Flu A&B, Covid) - Nasopharyngeal Swab     Status: Abnormal   Collection Time: 11/16/19  6:32 PM   Specimen: Nasopharyngeal Swab  Result Value Ref Range Status   SARS Coronavirus 2 by RT PCR POSITIVE (A) NEGATIVE Final    Comment: RESULT CALLED TO, READ BACK BY AND VERIFIED WITH: Mayer CamelS TOWNES RN 11/16/19 1955 JDW (NOTE) SARS-CoV-2 target nucleic acids are DETECTED. SARS-CoV-2 RNA is generally detectable in upper respiratory specimens  during the acute phase of infection. Positive results are indicative of the presence of the identified virus, but do not rule out bacterial infection or co-infection with other pathogens not detected by the test. Clinical correlation with patient history and other diagnostic information is necessary to determine patient infection status. The expected result is Negative. Fact Sheet for Patients:  https://www.moore.com/https://www.fda.gov/media/142436/download Fact Sheet for Healthcare Providers: https://www.young.biz/https://www.fda.gov/media/142435/download This test is not yet approved or cleared by the [place] FDA and  has been authorized for detection and/or diagnosis of SARS-CoV-2 by FDA under an Emergency Use Authorization (EUA).  This EUA will remain in effect (meaning this test can be used) for the  duration of  the COVID-19 declaration under Section 564(b)(1) of the Act, 21 U.S.C. section 360bbb-3(b)(1), unless the authorization is terminated or revoked sooner.    Influenza A by PCR NEGATIVE NEGATIVE Final   Influenza B by PCR NEGATIVE NEGATIVE Final    Comment: (NOTE) The Xpert Xpress SARS-CoV-2/FLU/RSV assay is intended as an aid in  the diagnosis of influenza from Nasopharyngeal swab specimens and  should not be used as a sole basis for treatment. Nasal washings and  aspirates are unacceptable for Xpert Xpress SARS-CoV-2/FLU/RSV  testing. Fact Sheet for  Patients: https://www.moore.com/https://www.fda.gov/media/142436/download Fact Sheet for Healthcare Providers: https://www.young.biz/https://www.fda.gov/media/142435/download This test is not yet approved or cleared by the [place] FDA and  has been authorized for detection and/or diagnosis of SARS-CoV-2 by  FDA under an Emergency Use Authorization (EUA). This EUA will remain  in effect (meaning this test can be used) for the duration of the  Covid-19 declaration under Section 564(b)(1) of the Act, 21  U.S.C. section 360bbb-3(b)(1), unless the authorization  is  terminated or revoked. Performed at [facility], [address] [place], [place] [address]   Wound or Superficial Culture     Status: None   Collection Time: 11/16/19  8:46 PM   Specimen: Heel; Wound  Result Value Ref Range Status   Specimen Description HEEL  Final   Special Requests NONE  Final   Gram Stain   Final    NO WBC SEEN MODERATE GRAM POSITIVE RODS FEW GRAM POSITIVE COCCI    Culture   Final    FEW METHICILLIN RESISTANT STAPHYLOCOCCUS AUREUS WITH IN MIXED CULTURE Performed at [facility], [address], [place], [place] [address]    Report Status 11/21/2019 FINAL  Final   Organism ID, Bacteria METHICILLIN RESISTANT STAPHYLOCOCCUS AUREUS  Final      Susceptibility   Methicillin resistant staphylococcus aureus - MIC*    CIPROFLOXACIN >=8 RESISTANT Resistant     ERYTHROMYCIN >=8 RESISTANT Resistant     GENTAMICIN <=0.5 SENSITIVE Sensitive     OXACILLIN >=4 RESISTANT Resistant     TETRACYCLINE <=1 SENSITIVE Sensitive     VANCOMYCIN 1 SENSITIVE Sensitive     TRIMETH/SULFA <=10 SENSITIVE Sensitive     CLINDAMYCIN <=0.25 SENSITIVE Sensitive     RIFAMPIN <=0.5 SENSITIVE Sensitive     Inducible Clindamycin NEGATIVE Sensitive     * FEW METHICILLIN RESISTANT STAPHYLOCOCCUS AUREUS    FURTHER DISCHARGE INSTRUCTIONS:  Get Medicines reviewed and adjusted: Please take all your medications with you for your next visit with your Primary  MD  Laboratory/radiological data: Please request your Primary MD to go over all hospital tests and procedure/radiological results at the follow up, please ask your Primary MD to get all Hospital records sent to his/her office.  In some cases, they will be blood work, cultures and biopsy results pending at the time of your discharge. Please request that your primary care M.D. goes through all the records of your hospital data and follows up on these results.  Also Note the following: If you experience worsening of your admission symptoms, develop shortness of breath, life threatening emergency, suicidal or homicidal thoughts you must seek medical attention immediately by calling 911 or calling your MD immediately  if symptoms less severe.  You must read complete instructions/literature along with all the possible adverse reactions/side effects for all the Medicines you take and that have been prescribed to you. Take any new Medicines after you have completely understood and accpet all the possible adverse reactions/side effects.   Do not drive when taking Pain medications or sleeping medications (Benzodaizepines)  Do not take more than prescribed Pain, Sleep and Anxiety Medications. It is not advisable to combine anxiety,sleep and pain medications without talking with your primary care practitioner  Special Instructions: If you have smoked or chewed Tobacco  in the last 2 yrs please stop smoking, stop any regular Alcohol  and or any Recreational drug use.  Wear Seat belts while driving.  Please note: You were cared for by a hospitalist during your hospital stay. Once you are discharged, your primary care physician will handle any further medical issues. Please note that NO REFILLS for any discharge medications will be authorized once you are discharged, as it is imperative that you return to your primary care physician (or establish a relationship with a primary care physician if you do not have  one) for your post hospital discharge needs so that they can reassess your need for medications and monitor your lab  values.  Total Time spent coordinating discharge including counseling, education and face to face time equals 45 minutes.  Signed: Jaeger Trueheart 11/21/2019 1:12 PM

## 2019-11-21 NOTE — Care Management Important Message (Signed)
Important Message  Patient Details  Name: Eric [name] MRN: [id number] Date of Birth: 06-01-1946   Medicare Important Message Given:  Yes - Important Message mailed due to current National Emergency  Verbal consent obtained due to current National Emergency  Relationship to patient: Guardian Contact Name: Jamal Collin Call Date: 11/21/19  Time: 1432 Phone: [phone] Outcome: No Answer/Busy Important Message mailed to: Patient address on file    Orson Aloe 11/21/2019, 2:32 PM

## 2019-12-04 ENCOUNTER — Other Ambulatory Visit: Payer: Self-pay

## 2019-12-04 ENCOUNTER — Emergency Department
Admission: EM | Admit: 2019-12-04 | Discharge: 2019-12-05 | Disposition: A | Discharge status: Home / Self Care | Payer: Medicare Other | Attending: Emergency Medicine | Admitting: Emergency Medicine

## 2019-12-04 DIAGNOSIS — E039 Hypothyroidism, unspecified: Secondary | ICD-10-CM | POA: Insufficient documentation

## 2019-12-04 DIAGNOSIS — I110 Hypertensive heart disease with heart failure: Secondary | ICD-10-CM | POA: Insufficient documentation

## 2019-12-04 DIAGNOSIS — R609 Edema, unspecified: Secondary | ICD-10-CM | POA: Insufficient documentation

## 2019-12-04 DIAGNOSIS — I5042 Chronic combined systolic (congestive) and diastolic (congestive) heart failure: Secondary | ICD-10-CM | POA: Insufficient documentation

## 2019-12-04 DIAGNOSIS — Z87891 Personal history of nicotine dependence: Secondary | ICD-10-CM | POA: Diagnosis not present

## 2019-12-04 DIAGNOSIS — Z79899 Other long term (current) drug therapy: Secondary | ICD-10-CM | POA: Insufficient documentation

## 2019-12-04 DIAGNOSIS — R2243 Localized swelling, mass and lump, lower limb, bilateral: Secondary | ICD-10-CM | POA: Diagnosis present

## 2019-12-04 LAB — CBC WITH DIFFERENTIAL/PLATELET
Abs Immature Granulocytes: 0.03 10*3/uL (ref 0.00–0.07)
Basophils Absolute: 0 10*3/uL (ref 0.0–0.1)
Basophils Relative: 0 %
Eosinophils Absolute: 0.1 10*3/uL (ref 0.0–0.5)
Eosinophils Relative: 1 %
HCT: 37.3 % — ABNORMAL LOW (ref 39.0–52.0)
Hemoglobin: 12.1 g/dL — ABNORMAL LOW (ref 13.0–17.0)
Immature Granulocytes: 0 %
Lymphocytes Relative: 4 %
Lymphs Abs: 0.3 10*3/uL — ABNORMAL LOW (ref 0.7–4.0)
MCH: 31.2 pg (ref 26.0–34.0)
MCHC: 32.4 g/dL (ref 30.0–36.0)
MCV: 96.1 fL (ref 80.0–100.0)
Monocytes Absolute: 0.5 10*3/uL (ref 0.1–1.0)
Monocytes Relative: 7 %
Neutro Abs: 7.4 10*3/uL (ref 1.7–7.7)
Neutrophils Relative %: 88 %
Platelets: 145 10*3/uL — ABNORMAL LOW (ref 150–400)
RBC: 3.88 MIL/uL — ABNORMAL LOW (ref 4.22–5.81)
RDW: 14.6 % (ref 11.5–15.5)
WBC: 8.4 10*3/uL (ref 4.0–10.5)
nRBC: 0 % (ref 0.0–0.2)

## 2019-12-04 LAB — COMPREHENSIVE METABOLIC PANEL
ALT: 11 U/L (ref 0–44)
AST: 16 U/L (ref 15–41)
Albumin: 3 g/dL — ABNORMAL LOW (ref 3.5–5.0)
Alkaline Phosphatase: 68 U/L (ref 38–126)
Anion gap: 8 (ref 5–15)
BUN: 19 mg/dL (ref 8–23)
CO2: 28 mmol/L (ref 22–32)
Calcium: 8.2 mg/dL — ABNORMAL LOW (ref 8.9–10.3)
Chloride: 104 mmol/L (ref 98–111)
Creatinine, Ser: 1.05 mg/dL (ref 0.61–1.24)
GFR calc Af Amer: >60 mL/min (ref >60)
GFR calc non Af Amer: >60 mL/min (ref >60)
Glucose, Bld: 178 mg/dL — ABNORMAL HIGH (ref 70–99)
Potassium: 3.9 mmol/L (ref 3.5–5.1)
Sodium: 140 mmol/L (ref 135–145)
Total Bilirubin: 1.6 mg/dL — ABNORMAL HIGH (ref 0.3–1.2)
Total Protein: 6.5 g/dL (ref 6.5–8.1)

## 2019-12-04 LAB — BRAIN NATRIURETIC PEPTIDE: B Natriuretic Peptide: 23.5 pg/mL (ref 0.0–100.0)

## 2019-12-04 NOTE — ED Notes (Signed)
Contacted Risk analyst at [phone]. Left a voicemail notifying of pt being seen in ER and about being d/c back to facility.

## 2019-12-04 NOTE — Discharge Instructions (Addendum)
Please seek medical attention for any high fevers, chest pain, shortness of breath, change in behavior, persistent vomiting, bloody stool or any other new or concerning symptoms.  

## 2019-12-04 NOTE — ED Notes (Signed)
Called son Trey Paula and uncle Cherre Huger listed in demographics to see about transportation back to [facility]. Neither answered so will set up EMS transport.

## 2019-12-04 NOTE — ED Notes (Signed)
Sarah, RN sent lactic on ice, 1 set of blood cultures, and rainbow of tubes to lab.

## 2019-12-04 NOTE — ED Notes (Signed)
Pt repositioned. Given two fresh warm blankets. Monitor cords reapplied as pt had pulled them off. Bed locked low. Rails up. Call bell within reach. Door open. Pt educated again.

## 2019-12-04 NOTE — ED Provider Notes (Signed)
[facility] Emergency Department Provider Note  ____________________________________________   I have reviewed the triage vital signs and the nursing notes.   HISTORY  Chief Complaint Leg Swelling   History limited by: Dementia   HPI Eric [name] is a 74 y.o. male who presents to the emergency department today from [facility] who comes to the hospital because of concerns of her bilateral leg edema.  The patient is currently being treated for cellulitis related to a right heel ulcer.  Patient himself states that he has had swelling in his legs for a long time.  Apparently the swelling was just worse today.  Patient does prescribe some discomfort in his legs.  He denies any shortness of breath.  Denies any chest pain or fevers.  Patient is currently receiving antibiotics for the cellulitis.  Records reviewed. Per medical record review patient has a history of recent visit with PCP who mentions edema and erythema of lower extremities.   Past Medical History:  Diagnosis Date  . Acute respiratory failure with hypoxia ([place])    multiple prior admissions to [facility] and [facility] for CHF exacerbation / respiratory failure  . Anxiety and depression   . Atrial fibrillation ([place]) 07/25/2018   new-onset during [place] admission in Jan 2020  . Cataract   . CHF (congestive heart failure) (H[facility])    EF <= 50% as of Jan 2020  . Current use of long term anticoagulation 07/2018   Started on apixaban for new-onset a-fib in Jan 2020  . Dementia ([place])    possible, likely age-related, documented decreased cognitive function on prior hospitalizations  . Gallstones   . Glaucoma   . Hypertension   . Hypoglycemia   . Hypothyroidism   . Inguinal hernia   . Sleep apnea   . Thyroid disease     Patient Active Problem List   Diagnosis Date Noted  . Cellulitis of right lower extremity   . Severe protein-calorie malnutrition ([place])   . Right foot ulcer ([place]) 11/16/2019  . Anxiety and  depression   . Cellulitis of right foot   . 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 Chronic combined systolic and diastolic CHF (congestive heart failure) ([place]) 11/16/2018  . Anasarca 11/16/2018  . Hypothyroidism 11/16/2018  . PAF (paroxysmal atrial fibrillation) ([place]) 11/16/2018  . HTN (hypertension) 11/16/2018  . Generalized anxiety disorder 11/03/2018  . Major depression in partial remission ([place]) 11/03/2018  . Acute hypoxemic respiratory failure ([place]) 10/29/2018  . Suspected COVID-19 virus infection 10/29/2018    Past Surgical History:  Procedure Laterality Date  . FINGER AMPUTATION Right     Prior to Admission medications   Medication Sig Start Date End Date Taking? Authorizing Provider  acetaminophen (TYLENOL) 325 MG tablet Take 2 tablets (650 mg total) by mouth every 6 (six) hours as needed for mild pain (or Fever >/= 101). Patient taking differently: Take 650 mg by mouth every 6 (six) hours as needed for mild pain or headache (fever >/= 101, or minor discomfort).  11/03/18   Loletha Grayer, MD  albuterol (VENTOLIN HFA) 108 (90 Base) MCG/ACT inhaler Inhale 2 puffs into the lungs every 6 (six) hours as needed for wheezing or shortness of breath. 11/21/19   Ghimire, Henreitta Leber, MD  alum & mag hydroxide-simeth (ANTACID) 200-200-20 MG/5ML suspension Take 30 mLs by mouth 4 (four) times daily as needed for indigestion or heartburn.    [provider]  collagenase (SANTYL) ointment Apply topically daily.  Apply to right heel ulcer plus dry dressing change daily 11/22/19   Ghimire, Werner Lean, MD  diazepam (VALIUM) 2 MG tablet Take 0.5-1 tablets (1-2 mg total) by mouth See admin instructions. Take 1 mg by mouth in the morning and 2 mg at bedtime 11/21/19   Ghimire, Werner Lean, MD  diphenhydrAMINE (DIPHENHIST) 25 MG tablet Take 25 mg by mouth every 6 (six) hours as needed (for allergic reactions).    [provider]  furosemide (LASIX) 20 MG  tablet Take 2 tablets (40 mg total) by mouth daily. 11/21/19   Ghimire, Werner Lean, MD  guaiFENesin (ROBITUSSIN) 100 MG/5ML liquid Take 300 mg by mouth every 6 (six) hours as needed for cough.    [provider]  levothyroxine (SYNTHROID) 200 MCG tablet Take 1 tablet (200 mcg total) by mouth daily before breakfast. 11/21/19   Ghimire, Werner Lean, MD  liver oil-zinc oxide (DESITIN) 40 % ointment Apply topically daily. Patient taking differently: Apply 1 application topically See admin instructions. Apply to buttocks once daily 11/04/18   Alford Highland, MD  loperamide (IMODIUM A-D) 2 MG tablet Take 4 mg by mouth every 3 (three) hours as needed for diarrhea or loose stools (CANNOT EXCEED 8 DOSES/24 HOURS).    [provider]  magnesium hydroxide (MILK OF MAGNESIA) 400 MG/5ML suspension Take 30 mLs by mouth daily as needed for mild constipation.    [provider]  metoCLOPramide (REGLAN) 5 MG tablet Take 5 mg by mouth every 6 (six) hours as needed for nausea or vomiting.    [provider]  Multiple Vitamins-Minerals (THERATRUM COMPLETE) TABS Take 1 tablet by mouth daily with breakfast.    [provider]  nystatin cream (MYCOSTATIN) Apply 1 application topically See admin instructions. Apply to right lower leg 2 times a week    [provider]  OLANZapine (ZYPREXA) 5 MG tablet Take 1 tablet (5 mg total) by mouth daily as needed (for agitation). 11/21/19   Ghimire, Werner Lean, MD  OLANZapine zydis (ZYPREXA) 10 MG disintegrating tablet Take 1 tablet (10 mg total) by mouth 2 (two) times daily. 11/03/18   Alford Highland, MD  OXYGEN Inhale 2 L/min into the lungs See admin instructions. "2 L/min, as tolerated"    [provider]  sertraline (ZOLOFT) 25 MG tablet Take 1 tablet (25 mg total) by mouth daily. 06/10/19   Charm Rings, NP  Skin Protectants, Misc. (EUCERIN) cream Apply 1 application topically See admin instructions. Apply to both legs 2  times a day    [provider]    Allergies Demerol [meperidine], Epinephrine, Nardil [phenelzine], Morphine and related, Ondansetron, and Shellfish allergy  Family History  Problem Relation Age of Onset  . Alzheimer's disease Mother     Social History Social History   Tobacco Use  . Smoking status: Former Smoker    Quit date: 10/18/2010    Years since quitting: 9.1  . Smokeless tobacco: Never Used  Substance Use Topics  . Alcohol use: No  . Drug use: No    Review of Systems Constitutional: No fever/chills Eyes: No visual changes. ENT: No sore throat. Cardiovascular: Denies chest pain. Respiratory: Denies shortness of breath. Gastrointestinal: No abdominal pain.  No nausea, no vomiting.  No diarrhea.   Genitourinary: Negative for dysuria. Musculoskeletal: Positive for bilateral lower extremity swelling.  Skin: Positive for erythema to lower extremities.  Neurological: Negative for headaches, focal weakness or numbness.  ____________________________________________   PHYSICAL EXAM:  VITAL SIGNS: ED Triage  Vitals  Enc Vitals Group     BP 12/04/19 2039 107/61     Pulse Rate 12/04/19 2039 99     Resp 12/04/19 2043 (!) 21     Temp 12/04/19 2052 98.1 F (36.7 C)     Temp Source 12/04/19 2052 Oral     SpO2 12/04/19 2039 94 %     Weight 12/04/19 2053 232 lb (105.2 kg)     Height 12/04/19 2053 5\' 9"  (1.753 m)     Head Circumference --      [facility] Flow --      Pain Score 12/04/19 2052 0   Constitutional: Alert and oriented.  Eyes: Conjunctivae are normal.  ENT      Head: Normocephalic and atraumatic.      Nose: No congestion/rhinnorhea.      Mouth/Throat: Mucous membranes are moist.      Neck: No stridor. Hematological/Lymphatic/Immunilogical: No cervical lymphadenopathy. Cardiovascular: Normal rate, regular rhythm.  No murmurs, rubs, or gallops.  Respiratory: Normal respiratory effort without tachypnea nor retractions. Breath sounds are clear and equal  bilaterally. No wheezes/rales/rhonchi. Gastrointestinal: Soft and non tender. No rebound. No guarding.  Genitourinary: Deferred Musculoskeletal: Normal range of motion in all extremities. Edema to bilateral lower extremities.  Neurologic:  Normal speech and language. No gross focal neurologic deficits are appreciated.  Skin:  Erythema to bilateral lower extremities.  Psychiatric: Mood and affect are normal. Speech and behavior are normal. Patient exhibits appropriate insight and judgment.  ____________________________________________    LABS (pertinent positives/negatives)  CMP na 140, k 3.9, glu 178, cr 1.05 BNP 23.5 CBC wbc 8.4, hgb 12.1, plt 145  ____________________________________________   EKG  None  ____________________________________________    RADIOLOGY  None  ____________________________________________   PROCEDURES  Procedures  ____________________________________________   INITIAL IMPRESSION / ASSESSMENT AND PLAN / ED COURSE  Pertinent labs & imaging results that were available during my care of the patient were reviewed by me and considered in my medical decision making (see chart for details).   Patient sent from [facility] because of concern for lower extremity edema.  It does appear the patient has some history of baseline edema.  He is currently being treated for his cellulitis and his right leg.  On exam patient does have edema to his legs.  There is some erythema although unclear if it is chronic venous insufficiency versus cellulitis he is being treated for.  Blood work was checked without any leukocytosis.  Patient is afebrile here.  I did check a BNP which was negative.  At this time do think is reasonable for patient be discharged back to [facility] for continued outpatient follow-up.  ____________________________________________   FINAL CLINICAL IMPRESSION(S) / ED DIAGNOSES  Final diagnoses:  Peripheral edema     Note: This  dictation was prepared with Dragon dictation. Any transcriptional errors that result from this process are unintentional     2053, MD 12/04/19 2222

## 2019-12-04 NOTE — ED Notes (Signed)
This RN to bedside as pt pulled call light. Two RNs at bedside helping pt shift backwards in bed as he had pulled out his IV and was scooting to the end of the bed.

## 2019-12-04 NOTE — ED Notes (Signed)
Pt asleep.

## 2019-12-04 NOTE — ED Triage Notes (Addendum)
Pt in via EMS from [facility] d/t bilat leg swelling. Pt A&Ox4 currently. Favors R side. Legs both hot. Denies fever. Ace wrap on R ankle. Wound to pt's R heel under ace wrap noted.  98.1 temp with EMS. BP: 126/71 HR: 98 BPM SpO2: 95% 2L

## 2019-12-05 ENCOUNTER — Other Ambulatory Visit: Payer: Self-pay

## 2019-12-05 ENCOUNTER — Emergency Department: Payer: Medicare Other

## 2019-12-05 ENCOUNTER — Emergency Department
Admission: EM | Admit: 2019-12-05 | Discharge: 2020-01-07 | Disposition: A | Discharge status: Other Acute Inpatient Hospital | Payer: Medicare Other | Attending: Emergency Medicine | Admitting: Emergency Medicine

## 2019-12-05 DIAGNOSIS — U071 COVID-19: Secondary | ICD-10-CM | POA: Insufficient documentation

## 2019-12-05 DIAGNOSIS — I5042 Chronic combined systolic (congestive) and diastolic (congestive) heart failure: Secondary | ICD-10-CM | POA: Diagnosis not present

## 2019-12-05 DIAGNOSIS — F4325 Adjustment disorder with mixed disturbance of emotions and conduct: Secondary | ICD-10-CM | POA: Diagnosis present

## 2019-12-05 DIAGNOSIS — F411 Generalized anxiety disorder: Secondary | ICD-10-CM | POA: Diagnosis present

## 2019-12-05 DIAGNOSIS — F419 Anxiety disorder, unspecified: Secondary | ICD-10-CM | POA: Diagnosis not present

## 2019-12-05 DIAGNOSIS — R0602 Shortness of breath: Secondary | ICD-10-CM

## 2019-12-05 DIAGNOSIS — M6281 Muscle weakness (generalized): Secondary | ICD-10-CM | POA: Insufficient documentation

## 2019-12-05 DIAGNOSIS — F324 Major depressive disorder, single episode, in partial remission: Secondary | ICD-10-CM | POA: Diagnosis present

## 2019-12-05 DIAGNOSIS — I110 Hypertensive heart disease with heart failure: Secondary | ICD-10-CM | POA: Diagnosis not present

## 2019-12-05 DIAGNOSIS — Z87891 Personal history of nicotine dependence: Secondary | ICD-10-CM | POA: Insufficient documentation

## 2019-12-05 DIAGNOSIS — Z046 Encounter for general psychiatric examination, requested by authority: Secondary | ICD-10-CM | POA: Diagnosis present

## 2019-12-05 DIAGNOSIS — E039 Hypothyroidism, unspecified: Secondary | ICD-10-CM | POA: Diagnosis present

## 2019-12-05 DIAGNOSIS — I4891 Unspecified atrial fibrillation: Secondary | ICD-10-CM | POA: Diagnosis not present

## 2019-12-05 DIAGNOSIS — L97519 Non-pressure chronic ulcer of other part of right foot with unspecified severity: Secondary | ICD-10-CM | POA: Diagnosis present

## 2019-12-05 DIAGNOSIS — Z7901 Long term (current) use of anticoagulants: Secondary | ICD-10-CM | POA: Insufficient documentation

## 2019-12-05 DIAGNOSIS — Z20822 Contact with and (suspected) exposure to covid-19: Secondary | ICD-10-CM | POA: Diagnosis present

## 2019-12-05 DIAGNOSIS — E43 Unspecified severe protein-calorie malnutrition: Secondary | ICD-10-CM | POA: Diagnosis present

## 2019-12-05 DIAGNOSIS — F0391 Unspecified dementia with behavioral disturbance: Secondary | ICD-10-CM | POA: Insufficient documentation

## 2019-12-05 DIAGNOSIS — Z79899 Other long term (current) drug therapy: Secondary | ICD-10-CM | POA: Diagnosis not present

## 2019-12-05 DIAGNOSIS — I480 Paroxysmal atrial fibrillation: Secondary | ICD-10-CM | POA: Diagnosis present

## 2019-12-05 DIAGNOSIS — L03115 Cellulitis of right lower limb: Secondary | ICD-10-CM | POA: Diagnosis not present

## 2019-12-05 DIAGNOSIS — J9601 Acute respiratory failure with hypoxia: Secondary | ICD-10-CM | POA: Diagnosis present

## 2019-12-05 DIAGNOSIS — R2689 Other abnormalities of gait and mobility: Secondary | ICD-10-CM | POA: Diagnosis not present

## 2019-12-05 DIAGNOSIS — I10 Essential (primary) hypertension: Secondary | ICD-10-CM | POA: Diagnosis present

## 2019-12-05 DIAGNOSIS — R601 Generalized edema: Secondary | ICD-10-CM | POA: Diagnosis present

## 2019-12-05 LAB — CBC WITH DIFFERENTIAL/PLATELET
Abs Immature Granulocytes: 0.03 10*3/uL (ref 0.00–0.07)
Basophils Absolute: 0 10*3/uL (ref 0.0–0.1)
Basophils Relative: 0 %
Eosinophils Absolute: 0.2 10*3/uL (ref 0.0–0.5)
Eosinophils Relative: 2 %
HCT: 36.5 % — ABNORMAL LOW (ref 39.0–52.0)
Hemoglobin: 11.9 g/dL — ABNORMAL LOW (ref 13.0–17.0)
Immature Granulocytes: 0 %
Lymphocytes Relative: 5 %
Lymphs Abs: 0.3 10*3/uL — ABNORMAL LOW (ref 0.7–4.0)
MCH: 31.4 pg (ref 26.0–34.0)
MCHC: 32.6 g/dL (ref 30.0–36.0)
MCV: 96.3 fL (ref 80.0–100.0)
Monocytes Absolute: 0.7 10*3/uL (ref 0.1–1.0)
Monocytes Relative: 10 %
Neutro Abs: 5.7 10*3/uL (ref 1.7–7.7)
Neutrophils Relative %: 83 %
Platelets: 162 10*3/uL (ref 150–400)
RBC: 3.79 MIL/uL — ABNORMAL LOW (ref 4.22–5.81)
RDW: 14.7 % (ref 11.5–15.5)
WBC: 6.9 10*3/uL (ref 4.0–10.5)
nRBC: 0 % (ref 0.0–0.2)

## 2019-12-05 LAB — COMPREHENSIVE METABOLIC PANEL
ALT: 10 U/L (ref 0–44)
AST: 15 U/L (ref 15–41)
Albumin: 2.6 g/dL — ABNORMAL LOW (ref 3.5–5.0)
Alkaline Phosphatase: 55 U/L (ref 38–126)
Anion gap: 6 (ref 5–15)
BUN: 14 mg/dL (ref 8–23)
CO2: 28 mmol/L (ref 22–32)
Calcium: 7.8 mg/dL — ABNORMAL LOW (ref 8.9–10.3)
Chloride: 105 mmol/L (ref 98–111)
Creatinine, Ser: 0.84 mg/dL (ref 0.61–1.24)
GFR calc Af Amer: >60 mL/min (ref >60)
GFR calc non Af Amer: >60 mL/min (ref >60)
Glucose, Bld: 138 mg/dL — ABNORMAL HIGH (ref 70–99)
Potassium: 3.4 mmol/L — ABNORMAL LOW (ref 3.5–5.1)
Sodium: 139 mmol/L (ref 135–145)
Total Bilirubin: 1.5 mg/dL — ABNORMAL HIGH (ref 0.3–1.2)
Total Protein: 5.8 g/dL — ABNORMAL LOW (ref 6.5–8.1)

## 2019-12-05 LAB — SARS CORONAVIRUS 2 BY RT PCR (HOSPITAL ORDER, PERFORMED IN ~~LOC~~ HOSPITAL LAB): SARS Coronavirus 2: NEGATIVE

## 2019-12-05 LAB — PROTIME-INR
INR: 1.1 (ref 0.8–1.2)
Prothrombin Time: 13.7 seconds (ref 11.4–15.2)

## 2019-12-05 LAB — SEDIMENTATION RATE: Sed Rate: 43 mm/hr — ABNORMAL HIGH (ref 0–20)

## 2019-12-05 LAB — LACTIC ACID, PLASMA
Lactic Acid, Venous: 2.1 mmol/L (ref 0.5–1.9)
Lactic Acid, Venous: 1 mmol/L (ref 0.5–1.9)

## 2019-12-05 MED ORDER — SULFAMETHOXAZOLE-TRIMETHOPRIM 800-160 MG PO TABS
1.0000 | ORAL_TABLET | Freq: Two times a day (BID) | ORAL | Status: AC
  Administered 2019-12-05 – 2019-12-11 (×6): 1 via ORAL
  Filled 2019-12-05 (×11): qty 1

## 2019-12-05 MED ORDER — COLLAGENASE 250 UNIT/GM EX OINT
TOPICAL_OINTMENT | Freq: Every day | CUTANEOUS | Status: DC
  Administered 2019-12-05: 1 via TOPICAL
  Filled 2019-12-05 (×2): qty 30

## 2019-12-05 MED ORDER — CEPHALEXIN 500 MG PO CAPS
500.0000 mg | ORAL_CAPSULE | Freq: Two times a day (BID) | ORAL | Status: DC
  Administered 2019-12-05 (×2): 500 mg via ORAL
  Filled 2019-12-05 (×8): qty 1

## 2019-12-05 MED ORDER — VANCOMYCIN HCL IN DEXTROSE 1-5 GM/200ML-% IV SOLN
1000.0000 mg | Freq: Once | INTRAVENOUS | Status: AC
  Administered 2019-12-05: 1000 mg via INTRAVENOUS
  Filled 2019-12-05: qty 200

## 2019-12-05 NOTE — ED Triage Notes (Signed)
Patient arrives from [facility] IVC'd due to aggression and refusing to take meds.

## 2019-12-05 NOTE — ED Notes (Signed)
Patient sleeping comfortably. Vss, xrays completed. ABX infusing. Awaiting wound care consult.

## 2019-12-05 NOTE — Consult Note (Addendum)
WOC Nurse Consult Note: Reason for Consult:Chronic nonhealing wound to right heel with cellulitis to bilateral lower legs with edema and tenderness and now atypical behavior.  Is on vancomycin for this infection.  Wound type:infectious Pressure Injury POA: Yes unstageable right heel chronic nonhealing cellulitis Wound bed:  2 cm x 2 cm 100% slough to wound bed.  Drainage (amount, consistency, odor) minimal serosanguinous   Periwound:edema and erythema Dressing procedure/placement/frequency: Bedside RN to perform.  Cleanse bilateral lower legs with soap and water and pat dry. Apply Santyl to right heel wound.  Cover with NS moist gauze  Secure with dry gauze and silicone foam.  Wrap both legs with kerlix and ace wrap.  Change daily.   Will not follow at this time.  Please re-consult if needed.  Maple Hudson MSN, RN, FNP-BC CWON Wound, Ostomy, Continence Nurse Pager [phone]

## 2019-12-05 NOTE — ED Provider Notes (Signed)
Repeat lactic acid downtrending and improved to 1.  No WBC count.  ESR mildly elevated at 43, but this appears consistent with his priors.  XR without evidence of osteomyelitis.  Has received a dose of IV antibiotics.  Do not feel he necessitates admission with regards to his wound.  He has been seen by wound consult who provided recommendations, appreciate their assistance.  Ordered PO course of Keflex and Bactrim for treatment of cellulitis.  He remains under IVC with psych consult pending.   Lilia Pro., MD 12/05/19 1911

## 2019-12-05 NOTE — ED Notes (Signed)
IVC PENDING  CONSULT ?

## 2019-12-05 NOTE — ED Notes (Signed)
Psych at bedside.

## 2019-12-05 NOTE — ED Provider Notes (Signed)
[facility] Emergency Department Provider Note       Time seen: ----------------------------------------- 1:48 PM on 12/05/2019 -----------------------------------------   I have reviewed the vital signs and the nursing notes.  HISTORY   Chief Complaint Aggressive Behavior and Foot Pain    HPI Eric [name] is a 74 y.o. male with a history of acute respiratory failure, anxiety, depression, A. fib, CHF, dementia, hypertension, hypothyroidism who presents today for aggression and refusing to take his medication.  Patient was not addressing the right foot wound as well.  No further information is available.  He received Versed and Haldol in route by EMS.  Past Medical History:  Diagnosis Date  . Acute respiratory failure with hypoxia (H[facility])    multiple prior admissions to [facility] and [facility] for CHF exacerbation / respiratory failure  . Anxiety and depression   . Atrial fibrillation (H[facility]) 07/25/2018   new-onset during [facility] admission in Jan 2020  . Cataract   . CHF (congestive heart failure) (HCC)    EF <= 50% as of Jan 2020  . Current use of long term anticoagulation 07/2018   Started on apixaban for new-onset a-fib in Jan 2020  . Dementia ([facility])    possible, likely age-related, documented decreased cognitive function on prior hospitalizations  . Gallstones   . Glaucoma   . Hypertension   . Hypoglycemia   . Hypothyroidism   . Inguinal hernia   . Sleep apnea   . Thyroid disease     Past Surgical History:  Procedure Laterality Date  . FINGER AMPUTATION Right     Allergies Demerol [meperidine], Epinephrine, Nardil [phenelzine], Morphine and related, Ondansetron, and Shellfish allergy  Review of Systems Constitutional: Negative for fever. Cardiovascular: Negative for chest pain. Respiratory: Negative for shortness of breath. Gastrointestinal: Negative for abdominal pain Musculoskeletal: Positive for lower extremity swelling Skin: Positive for right foot  wound Neurological: Negative for headaches, focal weakness or numbness.  All systems negative/normal/unremarkable except as stated in the HPI  ____________________________________________   PHYSICAL EXAM:  VITAL SIGNS: ED Triage Vitals  Enc Vitals Group     BP 12/05/19 1323 103/62     Pulse Rate 12/05/19 1323 79     Resp 12/05/19 1323 20     Temp 12/05/19 1323 97.8 F (36.6 C)     Temp Source 12/05/19 1323 Oral     SpO2 12/05/19 1322 96 %     Weight 12/05/19 1324 290 lb (131.5 kg)     Height 12/05/19 1324 6' (1.829 m)     Head Circumference --      [facility] Flow --      Pain Score 12/05/19 1324 0     Pain Loc --      Pain Edu? --      Excl. in GC? --    Constitutional: No acute distress, somewhat combative Eyes: Conjunctivae are normal. Normal extraocular movements. Cardiovascular: Normal rate, regular rhythm. No murmurs, rubs, or gallops. Respiratory: Normal respiratory effort without tachypnea nor retractions. Breath sounds are clear and equal bilaterally. No wheezes/rales/rhonchi. Gastrointestinal: Soft and nontender. Normal bowel sounds Musculoskeletal: Bilateral lower extremity edema with erythema and right heel ulceration.  Limited range of motion of the lower extremities Neurologic:  Normal speech and language. No gross focal neurologic deficits are appreciated.  Skin: Right heel ulceration, bilateral lower extremity erythema from the mid tibia down Psychiatric: Aggressive mood and affect ____________________________________________  EKG: Interpreted by me.  Sinus rhythm with first-degree AV block, rate of 67 bpm, normal  PR interval, normal QRS, normal QT  ____________________________________________   LABS (pertinent positives/negatives)  Labs Reviewed  LACTIC ACID, PLASMA - Abnormal; Notable for the following components:      Result Value   Lactic Acid, Venous 2.1 (*)    All other components within normal limits  COMPREHENSIVE METABOLIC PANEL - Abnormal;  Notable for the following components:   Potassium 3.4 (*)    Glucose, Bld 138 (*)    Calcium 7.8 (*)    Total Protein 5.8 (*)    Albumin 2.6 (*)    Total Bilirubin 1.5 (*)    All other components within normal limits  CBC WITH DIFFERENTIAL/PLATELET - Abnormal; Notable for the following components:   RBC 3.79 (*)    Hemoglobin 11.9 (*)    HCT 36.5 (*)    Lymphs Abs 0.3 (*)    All other components within normal limits  SEDIMENTATION RATE - Abnormal; Notable for the following components:   Sed Rate 43 (*)    All other components within normal limits  CULTURE, BLOOD (ROUTINE X 2)  CULTURE, BLOOD (ROUTINE X 2)  URINE CULTURE  SARS CORONAVIRUS 2 BY RT PCR (HOSPITAL ORDER, [facility])  PROTIME-INR  LACTIC ACID, PLASMA  URINALYSIS, ROUTINE W REFLEX MICROSCOPIC    RADIOLOGY  Images were viewed by me Chest x-ray, right foot x-ray IMPRESSION: Unchanged cardiomegaly with vascular congestion. Stable bibasilar scarring/atelectasis. IMPRESSION: Soft tissue wound in the plantar posterior foot, unchanged from prior imaging. No radiographic findings of osteomyelitis. Generalized soft tissue edema.   DIFFERENTIAL DIAGNOSIS  Cellulitis, abscess, diabetic foot ulceration, dementia with behavioral disturbance, sepsis  ASSESSMENT AND PLAN  Dementia with behavioral disturbance, pressure ulcer   Plan: The patient had presented for involuntary commitment due to aggressive behavior. Patient's labs thus far have mostly reassuring although lactic acid is slightly elevated and sed rate is somewhat elevated. Patient's imaging does not reveal evidence of osteomyelitis.  We have put in for a wound nurse consult.  He is received IV antibiotics.  He remains under involuntary commitment.  Lenise Arena MD    Note: This note was generated in part or whole with voice recognition software. Voice recognition is usually quite accurate but there are transcription errors  that can and very often do occur. I apologize for any typographical errors that were not detected and corrected.     Earleen Newport, MD 12/05/19 [phone]

## 2019-12-05 NOTE — ED Notes (Signed)
Pt asleep in bed at this time, waiting on psych consult.

## 2019-12-05 NOTE — ED Provider Notes (Signed)
-----------------------------------------   11:59 PM on 12/05/2019 -----------------------------------------  The patient has been placed in psychiatric observation due to the need to provide a safe environment for the patient while obtaining psychiatric consultation and evaluation, as well as ongoing medical and medication management to treat the patient's condition.  The patient has been placed under full IVC at this time.  Will order meds after med reconciliation.    Loleta Rose, MD 12/05/19 [phone]

## 2019-12-05 NOTE — ED Notes (Signed)
covid swab sent to lab

## 2019-12-05 NOTE — ED Notes (Signed)
Patient incontinent of bladder condum cath placed. Repeat lactate drawn and sent.

## 2019-12-06 ENCOUNTER — Ambulatory Visit: Payer: Medicare Other | Admitting: Orthopedic Surgery

## 2019-12-06 DIAGNOSIS — F0391 Unspecified dementia with behavioral disturbance: Secondary | ICD-10-CM | POA: Diagnosis not present

## 2019-12-06 MED ORDER — DIAZEPAM 2 MG PO TABS
1.0000 mg | ORAL_TABLET | Freq: Every day | ORAL | Status: DC
  Administered 2019-12-07 – 2019-12-13 (×5): 1 mg via ORAL
  Filled 2019-12-06 (×8): qty 1

## 2019-12-06 MED ORDER — SERTRALINE HCL 50 MG PO TABS
50.0000 mg | ORAL_TABLET | Freq: Every day | ORAL | Status: DC
  Administered 2019-12-07 – 2020-01-07 (×13): 50 mg via ORAL
  Filled 2019-12-06 (×21): qty 1

## 2019-12-06 MED ORDER — ZINC SULFATE 220 (50 ZN) MG PO CAPS
220.0000 mg | ORAL_CAPSULE | Freq: Every day | ORAL | Status: DC
  Administered 2019-12-12 – 2020-01-07 (×9): 220 mg via ORAL
  Filled 2019-12-06 (×20): qty 1

## 2019-12-06 MED ORDER — LEVOTHYROXINE SODIUM 50 MCG PO TABS
200.0000 ug | ORAL_TABLET | Freq: Every day | ORAL | Status: DC
  Administered 2019-12-06 – 2020-01-07 (×13): 200 ug via ORAL
  Filled 2019-12-06 (×17): qty 4

## 2019-12-06 MED ORDER — ADULT MULTIVITAMIN W/MINERALS CH
1.0000 | ORAL_TABLET | Freq: Every day | ORAL | Status: DC
  Administered 2019-12-06 – 2020-01-07 (×13): 1 via ORAL
  Filled 2019-12-06 (×24): qty 1

## 2019-12-06 MED ORDER — ALBUTEROL SULFATE (2.5 MG/3ML) 0.083% IN NEBU: 2.5000 mg | INHALATION_SOLUTION | Freq: Four times a day (QID) | RESPIRATORY_TRACT | Status: DC | PRN

## 2019-12-06 MED ORDER — OLANZAPINE 5 MG PO TBDP
10.0000 mg | ORAL_TABLET | Freq: Two times a day (BID) | ORAL | Status: DC
  Administered 2019-12-07 – 2019-12-12 (×5): 10 mg via ORAL
  Filled 2019-12-06 (×12): qty 2

## 2019-12-06 MED ORDER — ACETAMINOPHEN 325 MG PO TABS
650.0000 mg | ORAL_TABLET | Freq: Four times a day (QID) | ORAL | Status: DC | PRN
  Administered 2019-12-08 – 2020-01-04 (×5): 650 mg via ORAL
  Filled 2019-12-06 (×5): qty 2

## 2019-12-06 MED ORDER — METOCLOPRAMIDE HCL 10 MG PO TABS
5.0000 mg | ORAL_TABLET | Freq: Four times a day (QID) | ORAL | Status: DC | PRN
  Administered 2019-12-08: 5 mg via ORAL
  Filled 2019-12-06 (×2): qty 1

## 2019-12-06 MED ORDER — SERTRALINE HCL 50 MG PO TABS
25.0000 mg | ORAL_TABLET | Freq: Every day | ORAL | Status: DC
  Filled 2019-12-06: qty 1

## 2019-12-06 MED ORDER — DIAZEPAM 2 MG PO TABS
2.0000 mg | ORAL_TABLET | Freq: Every day | ORAL | Status: DC
  Administered 2019-12-08 – 2019-12-26 (×8): 2 mg via ORAL
  Filled 2019-12-06 (×14): qty 1

## 2019-12-06 MED ORDER — FUROSEMIDE 40 MG PO TABS
40.0000 mg | ORAL_TABLET | Freq: Every day | ORAL | Status: DC
  Administered 2019-12-07 – 2020-01-07 (×12): 40 mg via ORAL
  Filled 2019-12-06 (×21): qty 1

## 2019-12-06 MED ORDER — COLLAGENASE 250 UNIT/GM EX OINT
TOPICAL_OINTMENT | Freq: Every day | CUTANEOUS | Status: DC
  Filled 2019-12-06 (×2): qty 30

## 2019-12-06 MED ORDER — ASCORBIC ACID 500 MG PO TABS
500.0000 mg | ORAL_TABLET | Freq: Two times a day (BID) | ORAL | Status: DC
  Administered 2019-12-09 – 2020-01-07 (×24): 500 mg via ORAL
  Filled 2019-12-06 (×41): qty 1

## 2019-12-06 NOTE — ED Notes (Signed)
Pts linens changes at this time, new gown and brief placed on pt. Pt resting comfortably in bed at this time

## 2019-12-06 NOTE — Consult Note (Signed)
[facility] Face-to-Face Psychiatry Consult   Reason for Consult: Aggressive Behavior and Foot Pain Referring Physician:   Patient Identification: Eric [name] MRN:  [id number] Principal Diagnosis: <principal problem not specified> Diagnosis:  Active Problems:   Acute hypoxemic respiratory failure (HCC)   Suspected COVID-19 virus infection   Generalized anxiety disorder   Major depression in partial remission (HCC)   Chronic combined systolic and diastolic CHF (congestive heart failure) (HCC)   Anasarca   Hypothyroidism   PAF (paroxysmal atrial fibrillation) (HCC)   HTN (hypertension)   Adjustment disorder with mixed disturbance of emotions and conduct   Anxiety and depression   Cellulitis of right foot   Right foot ulcer (HCC)   Cellulitis of right lower extremity   Severe protein-calorie malnutrition (HCC)   Total Time spent with patient: 15 minutes  Subjective:   Eric [name] is a 74 y.o. male patient presented to [facility] ED via EMS under involuntary commitment status (IVC). The patient arrives from [facility] under IVC'd due to aggression and refusing to take meds.    The patient was seen face-to-face by this provider; chart reviewed and consulted with Dr. Colon Branch on 12/05/2019 due to the patient's care. It was discussed with the EDP  that the patient does meet the criteria to be admitted to the geriatrics psychiatric inpatient unit.  During the patient's psychiatric evaluation, the patient is alert, verbally aggressive, uncooperative, and mood-congruent with affect.  The patient does not appear to be responding to internal or external stimuli. The patient is presenting with some delusional thinking. The patient is unable to participate in the psychiatric assessment.  The patient remains verbally aggressive, using profanity when attempts are made to arouse him to participate in the assessment process.  Plan: The patient is a safety risk to himself, others and does require geriatrics  psychiatric inpatient admission for stabilization and treatment.  HPI: Per Dr. Mayford Knife: Eric [name] is a 74 y.o. male with a history of acute respiratory failure, anxiety, depression, A. fib, CHF, dementia, hypertension, hypothyroidism who presents today for aggression and refusing to take his medication.  Patient was not addressing the right foot wound as well.  No further information is available.  He received Versed and Haldol in route by EMS..  Past Psychiatric History:  Anxiety and depression Risk to Self:   Yes Risk to Others:   Yes Prior Inpatient Therapy:   Unknown Prior Outpatient Therapy:   Unknown  Past Medical History:  Past Medical History:  Diagnosis Date  . Acute respiratory failure with hypoxia (HCC)    multiple prior admissions to [facility] and [facility] for CHF exacerbation / respiratory failure  . Anxiety and depression   . Atrial fibrillation (HCC) 07/25/2018   new-onset during [facility] admission in Jan 2020  . Cataract   . CHF (congestive heart failure) (HCC)    EF <= 50% as of Jan 2020  . Current use of long term anticoagulation 07/2018   Started on apixaban for new-onset a-fib in Jan 2020  . Dementia (HCC)    possible, likely age-related, documented decreased cognitive function on prior hospitalizations  . Gallstones   . Glaucoma   . Hypertension   . Hypoglycemia   . Hypothyroidism   . Inguinal hernia   . Sleep apnea   . Thyroid disease     Past Surgical History:  Procedure Laterality Date  . FINGER AMPUTATION Right    Family History:  Family History  Problem Relation Age of Onset  .  Alzheimer's disease Mother    Family Psychiatric  History:  Social History:  Social History   Substance and Sexual Activity  Alcohol Use No     Social History   Substance and Sexual Activity  Drug Use No    Social History   Socioeconomic History  . Marital status: Divorced    Spouse name: Not on file  . Number of children: 3  . Years of education: Not on file   . Highest education level: Not on file  Occupational History  . Not on file  Tobacco Use  . Smoking status: Former Smoker    Quit date: 10/18/2010    Years since quitting: 9.1  . Smokeless tobacco: Never Used  Substance and Sexual Activity  . Alcohol use: No  . Drug use: No  . Sexual activity: Not on file  Other Topics Concern  . Not on file  Social History Narrative  . Not on file   Social Determinants of Health   Financial Resource Strain:   . Difficulty of Paying Living Expenses:   Food Insecurity:   . Worried About Programme researcher, broadcasting/film/video in the Last Year:   . Barista in the Last Year:   Transportation Needs:   . Freight forwarder (Medical):   Marland Kitchen Lack of Transportation (Non-Medical):   Physical Activity:   . Days of Exercise per Week:   . Minutes of Exercise per Session:   Stress:   . Feeling of Stress :   Social Connections:   . Frequency of Communication with Friends and Family:   . Frequency of Social Gatherings with Friends and Family:   . Attends Religious Services:   . Active Member of Clubs or Organizations:   . Attends Banker Meetings:   Marland Kitchen Marital Status:    Additional Social History:    Allergies:   Allergies  Allergen Reactions  . Demerol [Meperidine] Other (See Comments)    Will counteract with a drug he is taking causing fatal reaction with nardil  . Epinephrine Other (See Comments)    "Nardil reaction-With epi?" "Allergic," per MAR  . Nardil [Phenelzine] Other (See Comments)    "Allergic," per MAR  . Morphine And Related Other (See Comments)    Reacts with nardil- "Allergic," per MAR  . Ondansetron Other (See Comments)    Made patient want to climb the walls.  . Shellfish Allergy Hives    Labs:  Results for orders placed or performed during the hospital encounter of 12/05/19 (from the past 48 hour(s))  Comprehensive metabolic panel     Status: Abnormal   Collection Time: 12/05/19  1:28 PM  Result Value Ref Range    Sodium 139 135 - 145 mmol/L   Potassium 3.4 (L) 3.5 - 5.1 mmol/L   Chloride 105 98 - 111 mmol/L   CO2 28 22 - 32 mmol/L   Glucose, Bld 138 (H) 70 - 99 mg/dL    Comment: Glucose reference range applies only to samples taken after fasting for at least 8 hours.   BUN 14 8 - 23 mg/dL   Creatinine, Ser 3.41 0.61 - 1.24 mg/dL   Calcium 7.8 (L) 8.9 - 10.3 mg/dL   Total Protein 5.8 (L) 6.5 - 8.1 g/dL   Albumin 2.6 (L) 3.5 - 5.0 g/dL   AST 15 15 - 41 U/L   ALT 10 0 - 44 U/L   Alkaline Phosphatase 55 38 - 126 U/L   Total Bilirubin 1.5 (H)  0.3 - 1.2 mg/dL   GFR calc non Af Amer >60 >60 mL/min   GFR calc Af Amer >60 >60 mL/min   Anion gap 6 5 - 15    Comment: Performed at [facility], [address] [place], [place] [address]  CBC WITH DIFFERENTIAL     Status: Abnormal   Collection Time: 12/05/19  1:28 PM  Result Value Ref Range   WBC 6.9 4.0 - 10.5 K/uL   RBC 3.79 (L) 4.22 - 5.81 MIL/uL   Hemoglobin 11.9 (L) 13.0 - 17.0 g/dL   HCT 60.436.5 (L) 54.039.0 - 98.152.0 %   MCV 96.3 80.0 - 100.0 fL   MCH 31.4 26.0 - 34.0 pg   MCHC 32.6 30.0 - 36.0 g/dL   RDW 19.114.7 47.811.5 - 29.515.5 %   Platelets 162 150 - 400 K/uL   nRBC 0.0 0.0 - 0.2 %   Neutrophils Relative % 83 %   Neutro Abs 5.7 1.7 - 7.7 K/uL   Lymphocytes Relative 5 %   Lymphs Abs 0.3 (L) 0.7 - 4.0 K/uL   Monocytes Relative 10 %   Monocytes Absolute 0.7 0.1 - 1.0 K/uL   Eosinophils Relative 2 %   Eosinophils Absolute 0.2 0.0 - 0.5 K/uL   Basophils Relative 0 %   Basophils Absolute 0.0 0.0 - 0.1 K/uL   Immature Granulocytes 0 %   Abs Immature Granulocytes 0.03 0.00 - 0.07 K/uL    Comment: Performed at [facility], [address] [place], [place] [address]  Protime-INR     Status: None   Collection Time: 12/05/19  1:28 PM  Result Value Ref Range   Prothrombin Time 13.7 11.4 - 15.2 seconds   INR 1.1 0.8 - 1.2    Comment: (NOTE) INR goal varies based on device and disease states. Performed at [facility], [address] [place], [place] [address]   Sedimentation rate     Status: Abnormal   Collection Time: 12/05/19  1:28 PM  Result Value Ref Range   Sed Rate 43 (H) 0 - 20 mm/hr    Comment: Performed at [facility], [address] [place], [place] [address]  Lactic acid, plasma     Status: Abnormal   Collection Time: 12/05/19  1:29 PM  Result Value Ref Range   Lactic Acid, Venous 2.1 (HH) 0.5 - 1.9 mmol/L    Comment: CRITICAL RESULT CALLED TO, READ BACK BY AND VERIFIED WITH JENNIFER WHITLEY 1400 12/05/2019 DB Performed at [facility], [address] [place], [place] [address]   SARS Coronavirus 2 by RT PCR (hospital order, performed in [facility] hospital lab) Nasopharyngeal Nasopharyngeal Swab     Status: None   Collection Time: 12/05/19  1:56 PM   Specimen: Nasopharyngeal Swab  Result Value Ref Range   SARS Coronavirus 2 NEGATIVE NEGATIVE    Comment: (NOTE) SARS-CoV-2 target nucleic acids are NOT DETECTED. The SARS-CoV-2 RNA is generally detectable in upper and lower respiratory specimens during the acute phase of infection. The lowest concentration of SARS-CoV-2 viral copies this assay can detect is 250 copies / mL. A negative result does not preclude SARS-CoV-2 infection and should not be used as the sole basis for treatment or other patient management decisions.  A negative result may occur with improper specimen collection / handling, submission of specimen other than nasopharyngeal swab, presence of viral mutation(s) within the areas targeted by this assay, and inadequate number of viral copies (<250 copies / mL).  A negative result must be combined with clinical observations, patient history, and epidemiological information. Fact Sheet for Patients:   StrictlyIdeas.no Fact Sheet for Healthcare Providers: BankingDealers.co.za This test is not yet approved or cleared  by the [place] FDA and has been  authorized for detection and/or diagnosis of SARS-CoV-2 by FDA under an Emergency Use Authorization (EUA).  This EUA will remain in effect (meaning this test can be used) for the duration of the COVID-19 declaration under Section 564(b)(1) of the Act, 21 U.S.C. section 360bbb-3(b)(1), unless the authorization is terminated or revoked sooner. Performed at [facility], [address] [place], [place] [address]   Lactic acid, plasma     Status: None   Collection Time: 12/05/19  6:38 PM  Result Value Ref Range   Lactic Acid, Venous 1.0 0.5 - 1.9 mmol/L    Comment: Performed at [facility], [address] [place], [place] [address]    Current Facility-Administered Medications  Medication Dose Route Frequency Provider Last Rate Last Admin  . cephALEXin (KEFLEX) capsule 500 mg  500 mg Oral Q12H Lilia Pro., MD   500 mg at 12/05/19 1613  . collagenase (SANTYL) ointment   Topical Daily Earleen Newport, MD   1 application at 11/91/47 1445  . sulfamethoxazole-trimethoprim (BACTRIM DS) 800-160 MG per tablet 1 tablet  1 tablet Oral Q12H Lilia Pro., MD   1 tablet at 12/05/19 1613   Current Outpatient Medications  Medication Sig Dispense Refill  . acetaminophen (TYLENOL) 325 MG tablet Take 2 tablets (650 mg total) by mouth every 6 (six) hours as needed for mild pain (or Fever >/= 101). (Patient taking differently: Take 650 mg by mouth every 6 (six) hours as needed for mild pain or headache (fever >/= 101, or minor discomfort). )    . albuterol (VENTOLIN HFA) 108 (90 Base) MCG/ACT inhaler Inhale 2 puffs into the lungs every 6 (six) hours as needed for wheezing or shortness of breath. (Patient taking differently: Inhale 1 puff into the lungs every 6 (six) hours as needed for wheezing or shortness of breath. )    . alum & mag hydroxide-simeth (ANTACID) 200-200-20 MG/5ML suspension Take 30 mLs by mouth 4 (four) times daily as needed for indigestion or heartburn.    Marland Kitchen  ascorbic acid (VITAMIN C) 500 MG tablet Take 500 mg by mouth 2 (two) times daily.    . collagenase (SANTYL) ointment Apply topically daily. Apply to right heel ulcer plus dry dressing change daily 15 g 0  . diazepam (VALIUM) 2 MG tablet Take 0.5-1 tablets (1-2 mg total) by mouth See admin instructions. Take 1 mg by mouth in the morning and 2 mg at bedtime 10 tablet 0  . diphenhydrAMINE (DIPHENHIST) 25 MG tablet Take 25 mg by mouth every 6 (six) hours as needed (for allergic reactions).    . furosemide (LASIX) 20 MG tablet Take 2 tablets (40 mg total) by mouth daily. 30 tablet 0  . guaiFENesin (ROBITUSSIN) 100 MG/5ML liquid Take 300 mg by mouth every 6 (six) hours as needed for cough.    . levothyroxine (SYNTHROID) 200 MCG tablet Take 1 tablet (200 mcg total) by mouth daily before breakfast.    . liver oil-zinc oxide (DESITIN) 40 % ointment Apply topically daily. (Patient taking differently: Apply 1 application topically See admin instructions. Apply to affected area once daily) 56.7 g 0  . loperamide (IMODIUM A-D) 2 MG tablet Take 4 mg by mouth every 3 (three) hours as  needed for diarrhea or loose stools (CANNOT EXCEED 8 DOSES/24 HOURS).    . magnesium hydroxide (MILK OF MAGNESIA) 400 MG/5ML suspension Take 30 mLs by mouth daily as needed for mild constipation.    . metoCLOPramide (REGLAN) 5 MG tablet Take 5 mg by mouth every 6 (six) hours as needed for nausea or vomiting.    . Multiple Vitamins-Minerals (THERATRUM COMPLETE) TABS Take 1 tablet by mouth daily with breakfast.    . OLANZapine zydis (ZYPREXA) 10 MG disintegrating tablet Take 1 tablet (10 mg total) by mouth 2 (two) times daily. 60 tablet 0  . OXYGEN Inhale 2 L/min into the lungs See admin instructions. "2 L/min, as tolerated"    . sertraline (ZOLOFT) 25 MG tablet Take 1 tablet (25 mg total) by mouth daily. 30 tablet 0  . Skin Protectants, Misc. (EUCERIN) cream Apply 1 application topically See admin instructions. Apply to both legs 2 times  a day    . zinc sulfate (ZINC-220) 220 (50 Zn) MG capsule Take 220 mg by mouth daily.      Musculoskeletal: Strength & Muscle Tone: decreased Gait & Station: unsteady Patient leans: Backward  Psychiatric Specialty Exam: Physical Exam  Nursing note and vitals reviewed. Constitutional: He appears well-developed.  Cardiovascular: Normal rate.  Respiratory: Effort normal.  Musculoskeletal:        General: Tenderness present.     Cervical back: Normal range of motion and neck supple.  Neurological: He is alert.    Review of Systems  Psychiatric/Behavioral: Positive for agitation, behavioral problems, confusion and decreased concentration. The patient is nervous/anxious.   All other systems reviewed and are negative.   Blood pressure 103/64, pulse 93, temperature 97.8 F (36.6 C), temperature source Oral, resp. rate 20, height 6' (1.829 m), weight 131.5 kg, SpO2 96 %.Body mass index is 39.33 kg/m.  General Appearance: Fairly Groomed  Eye Contact:  Poor  Speech:  Garbled, Slow and Slurred  Volume:  Decreased  Mood:  Anxious, Dysphoric and Irritable  Affect:  Congruent  Thought Process:  Disorganized  Orientation:  Full (Time, Place, and Person)  Thought Content:  Illogical and Delusions  Suicidal Thoughts:  No  Homicidal Thoughts:  No  Memory:  Immediate;   Poor Recent;   Poor Remote;   Poor  Judgement:  Impaired  Insight:  Lacking  Psychomotor Activity:  Increased  Concentration:  Concentration: Poor and Attention Span: Poor  Recall:  Poor  Fund of Knowledge:  Poor  Language:  Poor  Akathisia:  NA  Handed:  Right  AIMS (if indicated):     Assets:  Communication Skills Desire for Improvement Physical Health Resilience Social Support  ADL's:  Impaired  Cognition:  Impaired,  Moderate  Sleep:        Treatment Plan Summary: Medication management and Plan Patient meets criteria for geriatric psychiatric inpatient admission.  Disposition: Recommend psychiatric  Inpatient admission when medically cleared. Supportive therapy provided about ongoing stressors.  Gillermo Murdoch, NP 12/06/2019 3:16 AM

## 2019-12-06 NOTE — ED Notes (Signed)
Pt yelling out "help me" repeatedly. Multiple staff members have redirected and assisted pt with care. However pt is continuing to yell out. Pt has also taken nasal canula off several times throughout the day and oxygen saturation would drop down into the 80's. Nasal canula placed back on pt again at this time.

## 2019-12-06 NOTE — ED Notes (Signed)
Pt given graham crackers and juice

## 2019-12-06 NOTE — ED Notes (Signed)
p refusing to take medications at this time. Pt also refusing to allow RN to change dressing on heel. RN attempted to convince pt to take meds and pt states " how many times do I have to tell you I aint taking any damn medication"

## 2019-12-06 NOTE — ED Notes (Signed)
Pt removed IV when staff not present in room.

## 2019-12-06 NOTE — BH Assessment (Signed)
Assessment Note  Eric [name] is an 74 y.o. male who presented to [facility] ED involuntarily for treatment. Per triage note, Patient arrives from [facility] IVC'd due to aggression and refusing to take meds.   During TTS Pt presented oriented x 1, irritable and very short on words. Pt was observed slouching while sitting up in the hospital bed with his head down. Pt was not able to maintain eye contact and speech was slow and low. Pt originally reported to be unaware of where he was, month or why he was in the hospital. Pt stated "I don't feel good" but was unable to identify specifics. Pt denied SI/HI/AH/VH. Pt grew more irritable during Dr. Janese Banks attempt to assess his living arrangements stating "if it's any of your damn business". Assessment ended due to pt current mentation.     Per Dr. Janese Banks is recommended for gero psych inpatient treatment.   Diagnosis: Major Depressive Disorder  Past Medical History:  Past Medical History:  Diagnosis Date  . Acute respiratory failure with hypoxia ([place])    multiple prior admissions to [facility] and [facility] for CHF exacerbation / respiratory failure  . Anxiety and depression   . Atrial fibrillation ([place]) 07/25/2018   new-onset during [facility] admission in Jan 2020  . Cataract   . CHF (congestive heart failure) (HCC)    EF <= 50% as of Jan 2020  . Current use of long term anticoagulation 07/2018   Started on apixaban for new-onset a-fib in Jan 2020  . Dementia ([place])    possible, likely age-related, documented decreased cognitive function on prior hospitalizations  . Gallstones   . Glaucoma   . Hypertension   . Hypoglycemia   . Hypothyroidism   . Inguinal hernia   . Sleep apnea   . Thyroid disease     Past Surgical History:  Procedure Laterality Date  . FINGER AMPUTATION Right     Family History:  Family History  Problem Relation Age of Onset  . Alzheimer's disease Mother     Social History:  reports that he quit smoking about 9 years ago. He has  never used smokeless tobacco. He reports that he does not drink alcohol or use drugs.  Additional Social History:  Alcohol / Drug Use Pain Medications: see mar Prescriptions: see mar Over the Counter: see mar History of alcohol / drug use?: (UTA)  CIWA: CIWA-Ar BP: 127/68 Pulse Rate: 75 COWS:    Allergies:  Allergies  Allergen Reactions  . Demerol [Meperidine] Other (See Comments)    Will counteract with a drug he is taking causing fatal reaction with nardil  . Epinephrine Other (See Comments)    "Nardil reaction-With epi?" "Allergic," per MAR  . Nardil [Phenelzine] Other (See Comments)    "Allergic," per MAR  . Morphine And Related Other (See Comments)    Reacts with nardil- "Allergic," per MAR  . Ondansetron Other (See Comments)    Made patient want to climb the walls.  Marland Kitchen Shellfish Allergy Hives    Home Medications: (Not in a hospital admission)   OB/GYN Status:  No LMP for male patient.  General Assessment Data Location of Assessment: [facility] ED TTS Assessment: In system Is this a Tele or Face-to-Face Assessment?: Face-to-Face Is this an Initial Assessment or a Re-assessment for this encounter?: Initial Assessment Patient Accompanied by:: N/A Language Other than English: No Living Arrangements: (Private home ) What gender do you identify as?: Male Marital status: Single Maiden name: n/a Pregnancy Status: No Living Arrangements:  Alone Can pt return to current living arrangement?: Yes Admission Status: Involuntary Petitioner: ED Attending Is patient capable of signing voluntary admission?: No Referral Source: MD Insurance type: None  Medical Screening Exam [facility] Walk-in ONLY) Medical Exam completed: Yes  Crisis Care Plan Living Arrangements: Alone Legal Guardian: Other: Name of Psychiatrist: UTA Name of Therapist: UTA  Education Status Is patient currently in school?: No Is the patient employed, unemployed or receiving disability?: (UTA)  Risk to self  with the past 6 months Suicidal Ideation: No Has patient been a risk to self within the past 6 months prior to admission? : (UTA) Suicidal Intent: No Has patient had any suicidal intent within the past 6 months prior to admission? : (UTA) Is patient at risk for suicide?: No, but patient needs Medical Clearance Suicidal Plan?: No Has patient had any suicidal plan within the past 6 months prior to admission? : (UTA) Access to Means: (UTA) What has been your use of drugs/alcohol within the last 12 months?: UTA Previous Attempts/Gestures: (UTA) How many times?: (UTA) Other Self Harm Risks: (UTA) Triggers for Past Attempts: (UTA) Intentional Self Injurious Behavior: (UTA) Family Suicide History: Unable to assess Recent stressful life event(s): (UTA) Persecutory voices/beliefs?: Rich Reining) Depression: Yes(UTA) Depression Symptoms: Feeling angry/irritable Substance abuse history and/or treatment for substance abuse?: (UTA) Suicide prevention information given to non-admitted patients: Not applicable  Risk to Others within the past 6 months Homicidal Ideation: No Does patient have any lifetime risk of violence toward others beyond the six months prior to admission? : Unknown Thoughts of Harm to Others: No Current Homicidal Intent: No Current Homicidal Plan: No Access to Homicidal Means: (UTA) Identified Victim: N/A History of harm to others?: (UTA) Assessment of Violence: On admission Violent Behavior Description: UTA Does patient have access to weapons?: (UTA) Criminal Charges Pending?: (UTA) Does patient have a court date: (UTA) Is patient on probation?: (UTA)  Psychosis Hallucinations: None noted Delusions: None noted  Mental Status Report Appearance/Hygiene: In scrubs Eye Contact: Poor Motor Activity: Restlessness Speech: Slow, Incoherent Level of Consciousness: Sleeping, Restless, Irritable Mood: Angry, Irritable Affect: Angry, Irritable Anxiety Level: (UTA) Thought  Processes: Thought Blocking Judgement: Unable to Assess Orientation: Unable to assess Obsessive Compulsive Thoughts/Behaviors: Unable to Assess  Cognitive Functioning Concentration: Unable to Assess Memory: Unable to Assess Is patient IDD: (UTA) Insight: Unable to Assess Impulse Control: Unable to Assess Appetite: (UTA) Have you had any weight changes? : (UTA) Sleep: Unable to Assess Total Hours of Sleep: (UTA) Vegetative Symptoms: Unable to Assess  ADLScreening [facility] Assessment Services) Patient's cognitive ability adequate to safely complete daily activities?: No Patient able to express need for assistance with ADLs?: Yes Independently performs ADLs?: (UTA)  Prior Inpatient Therapy Prior Inpatient Therapy: Yes Prior Therapy Facilty/Provider(s): [facility] Reason for Treatment: Adjustment disorder   Prior Outpatient Therapy Prior Outpatient Therapy: (UTA)  ADL Screening (condition at time of admission) Patient's cognitive ability adequate to safely complete daily activities?: No Is the patient deaf or have difficulty hearing?: Yes Does the patient have difficulty seeing, even when wearing glasses/contacts?: No Does the patient have difficulty concentrating, remembering, or making decisions?: Yes Patient able to express need for assistance with ADLs?: Yes Does the patient have difficulty dressing or bathing?: Yes Independently performs ADLs?: (UTA) Does the patient have difficulty walking or climbing stairs?: No Weakness of Legs: None Weakness of Arms/Hands: None  Home Assistive Devices/Equipment Home Assistive Devices/Equipment: None  Therapy Consults (therapy consults require a physician order) PT Evaluation Needed: No OT Evalulation Needed: No  SLP Evaluation Needed: No Abuse/Neglect Assessment (Assessment to be complete while patient is alone) Abuse/Neglect Assessment Can Be Completed: Unable to assess, patient is non-responsive or altered mental status Values /  Beliefs Cultural Requests During Hospitalization: None Spiritual Requests During Hospitalization: None Consults Spiritual Care Consult Needed: No Transition of Care Team Consult Needed: No Advance Directives (For Healthcare) Does Patient Have a Medical Advance Directive?: No Would patient like information on creating a medical advance directive?: No - Patient declined          Disposition:  Disposition Initial Assessment Completed for this Encounter: Yes Patient referred to: Other (Comment)  On Site Evaluation by:   Reviewed with Physician:    Opal Sidles 12/06/2019 6:45 PM

## 2019-12-06 NOTE — Progress Notes (Signed)
[facility] MD Progress Note  12/06/2019 4:40 PM Eric [name]  MRN:  [id number] Subjective:    "it is none of your business   Principal Problem: <principal problem not specified> Diagnosis: Active Problems:   Acute hypoxemic respiratory failure (HCC)   Suspected COVID-19 virus infection   Generalized anxiety disorder   Major depression in partial remission (HCC)   Chronic combined systolic and diastolic CHF (congestive heart failure) (HCC)   Anasarca   Hypothyroidism   PAF (paroxysmal atrial fibrillation) (HCC)   HTN (hypertension)   Adjustment disorder with mixed disturbance of emotions and conduct   Anxiety and depression   Cellulitis of right foot   Right foot ulcer (HCC)   Cellulitis of right lower extremity   Severe protein-calorie malnutrition (HCC)  Total Time spent with patient:   40 min  Past Psychiatric History: ongoing dementia issues   Past Medical History:  Past Medical History:  Diagnosis Date  . Acute respiratory failure with hypoxia (HCC)    multiple prior admissions to [facility] and [facility] for CHF exacerbation / respiratory failure  . Anxiety and depression   . Atrial fibrillation (HCC) 07/25/2018   new-onset during [facility] admission in Jan 2020  . Cataract   . CHF (congestive heart failure) (HCC)    EF <= 50% as of Jan 2020  . Current use of long term anticoagulation 07/2018   Started on apixaban for new-onset a-fib in Jan 2020  . Dementia (HCC)    possible, likely age-related, documented decreased cognitive function on prior hospitalizations  . Gallstones   . Glaucoma   . Hypertension   . Hypoglycemia   . Hypothyroidism   . Inguinal hernia   . Sleep apnea   . Thyroid disease     Past Surgical History:  Procedure Laterality Date  . FINGER AMPUTATION Right    Family History:  Family History  Problem Relation Age of Onset  . Alzheimer's disease Mother    Family Psychiatric  History:  previously written Social History:   Transfer pending to dementia or  geropsych ---already on meds at this time  Social History   Substance and Sexual Activity  Alcohol Use No     Social History   Substance and Sexual Activity  Drug Use No    Social History   Socioeconomic History  . Marital status: Divorced    Spouse name: Not on file  . Number of children: 3  . Years of education: Not on file  . Highest education level: Not on file  Occupational History  . Not on file  Tobacco Use  . Smoking status: Former Smoker    Quit date: 10/18/2010    Years since quitting: 9.1  . Smokeless tobacco: Never Used  Substance and Sexual Activity  . Alcohol use: No  . Drug use: No  . Sexual activity: Not on file  Other Topics Concern  . Not on file  Social History Narrative  . Not on file   Social Determinants of Health   Financial Resource Strain:   . Difficulty of Paying Living Expenses:   Food Insecurity:   . Worried About Programme researcher, broadcasting/film/video in the Last Year:   . Barista in the Last Year:   Transportation Needs:   . Freight forwarder (Medical):   Marland Kitchen Lack of Transportation (Non-Medical):   Physical Activity:   . Days of Exercise per Week:   . Minutes of Exercise per Session:   Stress:   .  Feeling of Stress :   Social Connections:   . Frequency of Communication with Friends and Family:   . Frequency of Social Gatherings with Friends and Family:   . Attends Religious Services:   . Active Member of Clubs or Organizations:   . Attends Banker Meetings:   Marland Kitchen Marital Status:    Additional Social History:     None for now                       SCurrent Medications: Current Facility-Administered Medications  Medication Dose Route Frequency Provider Last Rate Last Admin  . acetaminophen (TYLENOL) tablet 650 mg  650 mg Oral Q6H PRN Loleta Rose, MD      . albuterol (PROVENTIL) (2.5 MG/3ML) 0.083% nebulizer solution 2.5 mg  2.5 mg Inhalation Q6H PRN Loleta Rose, MD      . ascorbic acid (VITAMIN C) tablet 500  mg  500 mg Oral BID Loleta Rose, MD   Stopped at 12/06/19 [phone]  . cephALEXin (KEFLEX) capsule 500 mg  500 mg Oral Q12H Miguel Aschoff., MD   Stopped at 12/06/19 0940  . collagenase (SANTYL) ointment   Topical Daily Loleta Rose, MD   Stopped at 12/06/19 [phone]  . diazepam (VALIUM) tablet 1 mg  1 mg Oral Daily Loleta Rose, MD   Stopped at 12/06/19 [phone]  . diazepam (VALIUM) tablet 2 mg  2 mg Oral QHS Loleta Rose, MD      . furosemide (LASIX) tablet 40 mg  40 mg Oral Daily Loleta Rose, MD   Stopped at 12/06/19 [phone]  . levothyroxine (SYNTHROID) tablet 200 mcg  200 mcg Oral QAC breakfast Loleta Rose, MD   200 mcg at 12/06/19 0735  . metoCLOPramide (REGLAN) tablet 5 mg  5 mg Oral Q6H PRN Loleta Rose, MD      . multivitamin with minerals tablet 1 tablet  1 tablet Oral Q breakfast Loleta Rose, MD   1 tablet at 12/06/19 0735  . OLANZapine zydis (ZYPREXA) disintegrating tablet 10 mg  10 mg Oral BID Loleta Rose, MD   Stopped at 12/06/19 [phone]  . [START ON 12/07/2019] sertraline (ZOLOFT) tablet 50 mg  50 mg Oral Daily Roselind Messier, MD      . sulfamethoxazole-trimethoprim (BACTRIM DS) 800-160 MG per tablet 1 tablet  1 tablet Oral Q12H Miguel Aschoff., MD   Stopped at 12/06/19 [phone]  . zinc sulfate capsule 220 mg  220 mg Oral Daily Loleta Rose, MD   Stopped at 12/06/19 0940   Current Outpatient Medications  Medication Sig Dispense Refill  . acetaminophen (TYLENOL) 325 MG tablet Take 2 tablets (650 mg total) by mouth every 6 (six) hours as needed for mild pain (or Fever >/= 101). (Patient taking differently: Take 650 mg by mouth every 6 (six) hours as needed for mild pain or headache (fever >/= 101, or minor discomfort). )    . albuterol (VENTOLIN HFA) 108 (90 Base) MCG/ACT inhaler Inhale 2 puffs into the lungs every 6 (six) hours as needed for wheezing or shortness of breath. (Patient taking differently: Inhale 1 puff into the lungs every 6 (six) hours as needed for wheezing or shortness of  breath. )    . alum & mag hydroxide-simeth (ANTACID) 200-200-20 MG/5ML suspension Take 30 mLs by mouth 4 (four) times daily as needed for indigestion or heartburn.    Marland Kitchen ascorbic acid (VITAMIN C) 500 MG tablet Take 500 mg by mouth 2 (two) times daily.    Marland Kitchen  collagenase (SANTYL) ointment Apply topically daily. Apply to right heel ulcer plus dry dressing change daily 15 g 0  . diazepam (VALIUM) 2 MG tablet Take 0.5-1 tablets (1-2 mg total) by mouth See admin instructions. Take 1 mg by mouth in the morning and 2 mg at bedtime 10 tablet 0  . diphenhydrAMINE (DIPHENHIST) 25 MG tablet Take 25 mg by mouth every 6 (six) hours as needed (for allergic reactions).    . furosemide (LASIX) 20 MG tablet Take 2 tablets (40 mg total) by mouth daily. 30 tablet 0  . guaiFENesin (ROBITUSSIN) 100 MG/5ML liquid Take 300 mg by mouth every 6 (six) hours as needed for cough.    . levothyroxine (SYNTHROID) 200 MCG tablet Take 1 tablet (200 mcg total) by mouth daily before breakfast.    . liver oil-zinc oxide (DESITIN) 40 % ointment Apply topically daily. (Patient taking differently: Apply 1 application topically See admin instructions. Apply to affected area once daily) 56.7 g 0  . loperamide (IMODIUM A-D) 2 MG tablet Take 4 mg by mouth every 3 (three) hours as needed for diarrhea or loose stools (CANNOT EXCEED 8 DOSES/24 HOURS).    . magnesium hydroxide (MILK OF MAGNESIA) 400 MG/5ML suspension Take 30 mLs by mouth daily as needed for mild constipation.    . metoCLOPramide (REGLAN) 5 MG tablet Take 5 mg by mouth every 6 (six) hours as needed for nausea or vomiting.    . Multiple Vitamins-Minerals (THERATRUM COMPLETE) TABS Take 1 tablet by mouth daily with breakfast.    . OLANZapine zydis (ZYPREXA) 10 MG disintegrating tablet Take 1 tablet (10 mg total) by mouth 2 (two) times daily. 60 tablet 0  . OXYGEN Inhale 2 L/min into the lungs See admin instructions. "2 L/min, as tolerated"    . sertraline (ZOLOFT) 25 MG tablet Take 1  tablet (25 mg total) by mouth daily. 30 tablet 0  . Skin Protectants, Misc. (EUCERIN) cream Apply 1 application topically See admin instructions. Apply to both legs 2 times a day    . zinc sulfate (ZINC-220) 220 (50 Zn) MG capsule Take 220 mg by mouth daily.      Lab Results:  Results for orders placed or performed during the hospital encounter of 12/05/19 (from the past 48 hour(s))  Comprehensive metabolic panel     Status: Abnormal   Collection Time: 12/05/19  1:28 PM  Result Value Ref Range   Sodium 139 135 - 145 mmol/L   Potassium 3.4 (L) 3.5 - 5.1 mmol/L   Chloride 105 98 - 111 mmol/L   CO2 28 22 - 32 mmol/L   Glucose, Bld 138 (H) 70 - 99 mg/dL    Comment: Glucose reference range applies only to samples taken after fasting for at least 8 hours.   BUN 14 8 - 23 mg/dL   Creatinine, Ser 2.50 0.61 - 1.24 mg/dL   Calcium 7.8 (L) 8.9 - 10.3 mg/dL   Total Protein 5.8 (L) 6.5 - 8.1 g/dL   Albumin 2.6 (L) 3.5 - 5.0 g/dL   AST 15 15 - 41 U/L   ALT 10 0 - 44 U/L   Alkaline Phosphatase 55 38 - 126 U/L   Total Bilirubin 1.5 (H) 0.3 - 1.2 mg/dL   GFR calc non Af Amer >60 >60 mL/min   GFR calc Af Amer >60 >60 mL/min   Anion gap 6 5 - 15    Comment: Performed at [facility], [address], [place], [place] [address]  CBC  WITH DIFFERENTIAL     Status: Abnormal   Collection Time: 12/05/19  1:28 PM  Result Value Ref Range   WBC 6.9 4.0 - 10.5 K/uL   RBC 3.79 (L) 4.22 - 5.81 MIL/uL   Hemoglobin 11.9 (L) 13.0 - 17.0 g/dL   HCT 82.936.5 (L) 56.239.0 - 13.052.0 %   MCV 96.3 80.0 - 100.0 fL   MCH 31.4 26.0 - 34.0 pg   MCHC 32.6 30.0 - 36.0 g/dL   RDW 86.514.7 78.411.5 - 69.615.5 %   Platelets 162 150 - 400 K/uL   nRBC 0.0 0.0 - 0.2 %   Neutrophils Relative % 83 %   Neutro Abs 5.7 1.7 - 7.7 K/uL   Lymphocytes Relative 5 %   Lymphs Abs 0.3 (L) 0.7 - 4.0 K/uL   Monocytes Relative 10 %   Monocytes Absolute 0.7 0.1 - 1.0 K/uL   Eosinophils Relative 2 %   Eosinophils Absolute 0.2 0.0 - 0.5 K/uL    Basophils Relative 0 %   Basophils Absolute 0.0 0.0 - 0.1 K/uL   Immature Granulocytes 0 %   Abs Immature Granulocytes 0.03 0.00 - 0.07 K/uL    Comment: Performed at [facility], [address] [place], [place] [address]  Protime-INR     Status: None   Collection Time: 12/05/19  1:28 PM  Result Value Ref Range   Prothrombin Time 13.7 11.4 - 15.2 seconds   INR 1.1 0.8 - 1.2    Comment: (NOTE) INR goal varies based on device and disease states. Performed at [facility], [address] [place], [place] [address]   Blood Culture (routine x 2)     Status: None (Preliminary result)   Collection Time: 12/05/19  1:28 PM   Specimen: BLOOD  Result Value Ref Range   Specimen Description BLOOD BLOOD RIGHT HAND    Special Requests      BOTTLES DRAWN AEROBIC AND ANAEROBIC Blood Culture results may not be optimal due to an excessive volume of blood received in culture bottles   Culture      NO GROWTH < 24 HOURS Performed at [facility], [address] [place], [place] [address]    Report Status PENDING   Sedimentation rate     Status: Abnormal   Collection Time: 12/05/19  1:28 PM  Result Value Ref Range   Sed Rate 43 (H) 0 - 20 mm/hr    Comment: Performed at [facility], [address] [place], [place] [address]  Lactic acid, plasma     Status: Abnormal   Collection Time: 12/05/19  1:29 PM  Result Value Ref Range   Lactic Acid, Venous 2.1 (HH) 0.5 - 1.9 mmol/L    Comment: CRITICAL RESULT CALLED TO, READ BACK BY AND VERIFIED WITH JENNIFER WHITLEY 1400 12/05/2019 DB Performed at [facility], [address] [place], [place] [address]   SARS Coronavirus 2 by RT PCR (hospital order, performed in [facility] hospital lab) Nasopharyngeal Nasopharyngeal Swab     Status: None   Collection Time: 12/05/19  1:56 PM   Specimen: Nasopharyngeal Swab  Result Value Ref Range   SARS Coronavirus 2 NEGATIVE NEGATIVE    Comment: (NOTE) SARS-CoV-2  target nucleic acids are NOT DETECTED. The SARS-CoV-2 RNA is generally detectable in upper and lower respiratory specimens during the acute phase of infection. The lowest concentration of SARS-CoV-2 viral copies this assay can detect is 250 copies / mL. A negative result does not preclude SARS-CoV-2 infection  and should not be used as the sole basis for treatment or other patient management decisions.  A negative result may occur with improper specimen collection / handling, submission of specimen other than nasopharyngeal swab, presence of viral mutation(s) within the areas targeted by this assay, and inadequate number of viral copies (<250 copies / mL). A negative result must be combined with clinical observations, patient history, and epidemiological information. Fact Sheet for Patients:   StrictlyIdeas.no Fact Sheet for Healthcare Providers: BankingDealers.co.za This test is not yet approved or cleared  by the [place] FDA and has been authorized for detection and/or diagnosis of SARS-CoV-2 by FDA under an Emergency Use Authorization (EUA).  This EUA will remain in effect (meaning this test can be used) for the duration of the COVID-19 declaration under Section 564(b)(1) of the Act, 21 U.S.C. section 360bbb-3(b)(1), unless the authorization is terminated or revoked sooner. Performed at [facility], [address] [place], [place] [address]   Lactic acid, plasma     Status: None   Collection Time: 12/05/19  6:38 PM  Result Value Ref Range   Lactic Acid, Venous 1.0 0.5 - 1.9 mmol/L    Comment: Performed at [facility], [address] [place], [place] [address]    Blood Alcohol level:  Lab Results  Component Value Date   [facility] <10 01/18/2019   ETH <11 26/94/8546    Metabolic Disorder Labs: No results found for: HGBA1C, MPG No results found for: PROLACTIN No results found for: CHOL, TRIG, HDL, CHOLHDL,  VLDL, LDLCALC  Physical Findings: AIMS:  , ,  ,  ,    CIWA:    COWS:        Alert irritable edgy frustrated Oriented to person place date and time Not clouded or fluctuant Mood depressed affect constricted Not cooperative Cannot assess memory and all not cooperating   Judgement --insight reliability poor  Abstraction poor  No active SI and HI  Concentration and attention fair to poo Cannot tolerate longer interviews Speech --somewhat low tone volume fluency  Fund of knowledge intelligence and cognition --waning  Do not see major shakes and tremors or other movement problems     A/P patient refusing meds and remains on IVC ---awaiting bed transfer to gero psych inpatient unit   His meds are written if he can take them but it is on and off  Tried to do supportive statements but he angry and cannot tolerate much at this time                                               Treatment Plan Summary:  See above     Eulas Post, MD 12/06/2019, 4:40 PM

## 2019-12-06 NOTE — ED Notes (Signed)
Pt awake and mildly agitated at this time. Pt refusing to take any and all med's. Pt answers questions appropriately but has trouble understanding why he is here and where he is going after discharge.

## 2019-12-06 NOTE — ED Notes (Signed)
Pt more alert and willing to cooperate with care. Medication placed on rt heel and wrapped with a try dressing at this time. Pt also given dinner tray from dietary

## 2019-12-06 NOTE — ED Provider Notes (Signed)
Emergency Medicine Observation Re-evaluation Note  Eric [name] is a 74 y.o. male, seen on rounds today.  Pt initially presented to the ED for complaints of Aggressive Behavior and Foot Pain Currently, the patient is stable.  Physical Exam  BP (!) 103/56   Pulse 67   Temp 97.8 F (36.6 C) (Oral)   Resp 14   Ht 6' (1.829 m)   Wt 131.5 kg   SpO2 99%   BMI 39.33 kg/m  Physical Exam  ED Course / MDM  EKG:    I have reviewed the labs performed to date as well as medications administered while in observation.  Recent changes in the last 24 hours include none. Plan  Current plan is for psych disposition. Patient is under full IVC at this time.   Shaune Pollack, MD 12/06/19 [phone]

## 2019-12-06 NOTE — ED Notes (Addendum)
RN attempted to admin morning medications for a second time and pt refused to take them. Pt given meal tray and ice water to drink at this time

## 2019-12-06 NOTE — ED Notes (Signed)
Pt given meal tray and apple juice at this time 

## 2019-12-07 DIAGNOSIS — F0391 Unspecified dementia with behavioral disturbance: Secondary | ICD-10-CM | POA: Diagnosis not present

## 2019-12-07 LAB — URINALYSIS, ROUTINE W REFLEX MICROSCOPIC
Bilirubin Urine: NEGATIVE
Glucose, UA: NEGATIVE mg/dL
Hgb urine dipstick: NEGATIVE
Ketones, ur: NEGATIVE mg/dL
Leukocytes,Ua: NEGATIVE
Nitrite: NEGATIVE
Protein, ur: NEGATIVE mg/dL
Specific Gravity, Urine: 1.012 (ref 1.005–1.030)
pH: 9 — ABNORMAL HIGH (ref 5.0–8.0)

## 2019-12-07 MED ORDER — ZIPRASIDONE MESYLATE 20 MG IM SOLR
10.0000 mg | Freq: Once | INTRAMUSCULAR | Status: AC
  Administered 2019-12-07: 10 mg via INTRAMUSCULAR
  Filled 2019-12-07: qty 20

## 2019-12-07 MED ORDER — LORAZEPAM 2 MG/ML IJ SOLN
1.0000 mg | Freq: Once | INTRAMUSCULAR | Status: AC
  Administered 2019-12-07: 1 mg via INTRAMUSCULAR
  Filled 2019-12-07: qty 1

## 2019-12-07 NOTE — ED Notes (Addendum)
Lurena Joiner from [facility]. Health called this RN back to inform me that this pt has been declined placement at this facility due to his ambulation statue and current wounds (feet wraps due to weeping)

## 2019-12-07 NOTE — BHH Counselor (Signed)
Writer called [facility] and spoke with Lauren regarding if they received the faxed clinical notes for patient. Lauren reported that they are short staffed and will not know if paper work was received until first shift comes in tomorrow. Writer will pass this on at shift report for TTS to check on tomorrow.

## 2019-12-07 NOTE — ED Notes (Signed)
Pt increasingly agitated,. Pt refusing for lights to be dimmed and his medication. Pt actively hollering out stating "Im not supposed to be here, I want to leave. You need to let me out"/  Pt agitated at all staff and is unable to be redirected or calmed down at this time.   Pt snatching equipment from staff. MD made aware.

## 2019-12-07 NOTE — ED Notes (Signed)
Pt still appears to be resting comfortably at this time. Respirations remain even and unlabored.

## 2019-12-07 NOTE — ED Notes (Signed)
Pt appears to be resting comfortably at this time. Lunch tray set beside patient for when patient wakes up.

## 2019-12-07 NOTE — ED Notes (Signed)
Pt appears to be resting comfortably at this time. Respirations even and unlabored 

## 2019-12-07 NOTE — ED Notes (Signed)
Pt continually asking why he is still here. Pt updated on plan. Pt states "Fuck this place, and fuck these doctors".

## 2019-12-07 NOTE — ED Notes (Signed)
Pt took the marked meds as seen in [facility], but refused to take rest of medication. Pt had also taken off oxygen,but let this RN place oxygen back in nose. Pt calm at this time. Pt given coke to drink at this time.

## 2019-12-07 NOTE — ED Notes (Signed)
Pt given meal tray and water to drink.  

## 2019-12-07 NOTE — ED Notes (Addendum)
[facility]. Health Cordinator  Lurena Joiner) reached out to the RN to get follow up questions answered regarding placement .

## 2019-12-07 NOTE — BH Assessment (Signed)
Pt pending review with [facility]. [name] requested pt MAR & most recent RN notes. Information faxed this morning and [name] agreed to call back after staffing pt with the MD.

## 2019-12-07 NOTE — BH Assessment (Signed)
Referral information for Psychiatric Hospitalization faxed to;   [facility] [place] [phone]),   . [facility] ([phone]--[phone])  . [facility] [phone]- [phone]) [phone]  . [facility] [phone]),  . [facility] [phone], [phone], [phone] or [phone]),   . [facility] [phone]),   . [facility] [phone] or [phone])  . [facility] [phone] or [phone]),   . [facility] [phone]).

## 2019-12-07 NOTE — ED Notes (Signed)
Pt had large bm, new brief applied. Peri-care performed by RN.

## 2019-12-07 NOTE — ED Notes (Signed)
Pt allowed this RN and amy, rn to change patients bed sheets and gown. Pt would not let us change patient into new gown at this time. Other than that, patient remains calm and cooperative at this time. Pt had to be reminded to keep oxygen cannula in nose.

## 2019-12-08 DIAGNOSIS — F0391 Unspecified dementia with behavioral disturbance: Secondary | ICD-10-CM | POA: Diagnosis not present

## 2019-12-08 LAB — URINE CULTURE: Culture: 20000 — AB

## 2019-12-08 NOTE — ED Notes (Signed)
Pt sleeping when this RN entered room. Pt awakened by voice. Pt had taken O2 [place] off. O2 sats 86-88% on room air. Pt breathing by mouth. O2 [place] replaced, 4 L [place]. O2 sats increased to 96-97%. Pt sleeping on and off. Audible expiratory wheezes noted at time. Pt calm and sleepy. Pt disoriented to place and situation and time.

## 2019-12-08 NOTE — ED Notes (Signed)
Pt sleeping at present, pt in low bed. Pt leaving O2 [place] on at present.

## 2019-12-08 NOTE — ED Notes (Signed)
RN attempted to give medications again however patient stated "if its not tylenol, I don't want it".

## 2019-12-08 NOTE — Social Work (Signed)
TOC CM/SW received a call from [facility], [phone].   [name] is requesting a call back from TTS/BH team member.    SW forwarded message to [phone].    Larwance Rote, MSW, LCSW  [phone] 8am-6pm (weekends)

## 2019-12-08 NOTE — ED Notes (Signed)
Patient has removed Pulse Oximetry probe from finger and has taken Nasal Canula off.

## 2019-12-08 NOTE — ED Notes (Signed)
Pt agitated off and on, states he feels scared at times. ED tech sat with pt during fire alarm for pt's comfort. Pt calm at present, eating from meal tray.

## 2019-12-08 NOTE — ED Notes (Signed)
RN went to [facility] to speak with Psych Practitioner about patient refusing oral medications however no Psych Practitioner has arrived yet at this time.

## 2019-12-08 NOTE — ED Notes (Signed)
Pt refusing meds, pt states he will not take any medicine and does not need any.

## 2019-12-08 NOTE — ED Notes (Signed)
Falls wristband place, pt removed.

## 2019-12-08 NOTE — ED Notes (Signed)
Pt sleeping, lunch tray placed at bedside.

## 2019-12-08 NOTE — ED Notes (Signed)
RN went into room to assess patient and ask of RN could change him, patient stated "I didn't shit or anything, no I don't need to be changed". RN acknowledged and informed patient if he does or wants RN to clean and change him to hit the call bell.

## 2019-12-08 NOTE — ED Notes (Signed)
Pt awake, remaining in bed, continues to refuse meds. Pt frequently stating he is scared. Pt given verbal reassurance, pt encouraged to take meds. Pt continues to refuse.

## 2019-12-08 NOTE — BH Assessment (Signed)
Writer faxed the requested information ([place] notes & MAR) to [facility] [phone]) and confirmed it was received.

## 2019-12-08 NOTE — ED Notes (Signed)
Attempted to give pt evening meds, pt refused. "I am not taking that shit, I don't need medicine. " This RN tried 3 times, pt refused each time.

## 2019-12-08 NOTE — ED Notes (Signed)
Pt heard yelling from his room, "Dr! Irena Cords the damn dr at?" this RN stepped into patient's room to ask if he needed something, pt wants the dr and made aware the dr is working with another patient. Pt told that he was in the ER, pt states, "i'm not in no damn ER." pt reassured and report given to pt's nurse

## 2019-12-08 NOTE — ED Provider Notes (Signed)
Emergency Medicine Observation Re-evaluation Note  Eric [name] is a 74 y.o. male, seen on rounds today.  Pt initially presented to the ED for complaints of Aggressive Behavior and Foot Pain Currently, the patient is sleeping.  Physical Exam  BP (!) 151/77 (BP Location: Right Arm)   Pulse 88   Temp 98.3 F (36.8 C) (Axillary)   Resp 16   Ht 6' (1.829 m)   Wt 131.5 kg   SpO2 93%   BMI 39.33 kg/m  Physical Exam  ED Course / MDM  EKG:EKG Interpretation  Date/Time:  Wednesday Dec 05 2019 13:39:00 EDT Ventricular Rate:  67 PR Interval:  210 QRS Duration: 70 QT Interval:  398 QTC Calculation: 420 R Axis:   6 Text Interpretation: Sinus rhythm with marked sinus arrhythmia with 1st degree A-V block Otherwise normal ECG Confirmed by UNCONFIRMED, DOCTOR ([id number]), editor Fredric Mare, Tammy [phone]) on 12/06/2019 9:19:17 AM    I have reviewed the labs performed to date as well as medications administered while in observation.  Recent changes in the last 24 hours include none. Plan  Current plan is for psych dispo. Patient is under full IVC at this time.   Don Perking, [place], MD 12/08/19 [phone]

## 2019-12-08 NOTE — ED Notes (Signed)
RN spoke with NP Cassell Clement and is ok with patient not taking oral medication as long as patient is not being aggressive.

## 2019-12-09 DIAGNOSIS — F0391 Unspecified dementia with behavioral disturbance: Secondary | ICD-10-CM | POA: Diagnosis not present

## 2019-12-09 NOTE — ED Notes (Signed)
Pt agitated at times, pt remaining in bed. Pt removing monitors and O2 tubing frequently. Monitors and O2 tubing replaced

## 2019-12-09 NOTE — ED Provider Notes (Signed)
Emergency Medicine Observation Re-evaluation Note  Eric [name] is a 74 y.o. male, seen on rounds today.  Pt initially presented to the ED for complaints of Aggressive Behavior and Foot Pain Currently, the patient is resting in no acute distress.  Physical Exam  BP 136/77   Pulse 70   Temp 98.3 F (36.8 C) (Axillary)   Resp 20   Ht 6' (1.829 m)   Wt 131.5 kg   SpO2 97%   BMI 39.33 kg/m  Physical Exam  ED Course / MDM  EKG:EKG Interpretation  Date/Time:  Wednesday Dec 05 2019 13:39:00 EDT Ventricular Rate:  67 PR Interval:  210 QRS Duration: 70 QT Interval:  398 QTC Calculation: 420 R Axis:   6 Text Interpretation: Sinus rhythm with marked sinus arrhythmia with 1st degree A-V block Otherwise normal ECG Confirmed by UNCONFIRMED, DOCTOR ([id number]), editor Fredric Mare, Tammy [phone]) on 12/06/2019 9:19:17 AM    I have reviewed the labs performed to date as well as medications administered while in observation.  Recent changes in the last 24 hours include patient refused nighttime medications. Plan  Current plan is for psychiatric disposition. Patient is under full IVC at this time.   Irean Hong, MD 12/09/19 [phone]

## 2019-12-09 NOTE — ED Notes (Signed)
Patient calling out, saying help me.  Patient unable to verbalize what he needs help with.  Patient given reassurance.

## 2019-12-09 NOTE — ED Notes (Signed)
Pt bed diaper changed. Small amount of urine noted. Pt given sips of water and ensure. Pt has not eaten breakfast or lunch today. Refusing to eat at this time. Will take sips of ensure. Will continue to push fluids/ensure.

## 2019-12-09 NOTE — ED Notes (Signed)
Monitor alarming at the nurse's station, pt's heart rate with the pulse ox was noted to be irregular in the 160s and then decreased back down to the 60s repeatedly. Pt was not on cardiac monitor at the time, however, pt was placed on monitor and heart rate did not increase once on monitor, irregular, a-fib noted on monitor.

## 2019-12-09 NOTE — ED Notes (Signed)
Pt continues to refuse meds. Pt remaining in bed.

## 2019-12-09 NOTE — ED Notes (Signed)
Pt remains asleep. Awakes with verbal stimuli.  Encouraging PO intake with ensure and water.

## 2019-12-09 NOTE — ED Notes (Signed)
Pt acuity changed based on behavioral pt

## 2019-12-09 NOTE — ED Notes (Signed)
Pt calm and interactive when interacting with this RN, pt requesting specific food but settled for this RN making PB saltines for pt and pt drank some water

## 2019-12-09 NOTE — BH Assessment (Signed)
Referral status check:    Eric [name] ([phone]- [phone]), Eric [name] reports no adult beds available    [facility] ([phone]--[phone]), No answer   [facility] [phone]) [phone] Eric [name] reports denied due to aggression   Eric [name] ([phone]---[phone]), Left voicemail    Eric [name] [phone], [phone], [phone] or [phone]), Eric [name] reports to call back tomorrow    [facility] [phone]), No answer    [facility] [phone] or [phone]), Eric [name] asked for referral to be resent, tasked completed 12/09/19   Eric [name] [phone] or [phone]), Eric [name] reports under review and confirmed receiving additional documents requested    Eric [name] [phone]). Left voicemail

## 2019-12-10 DIAGNOSIS — F0391 Unspecified dementia with behavioral disturbance: Secondary | ICD-10-CM | POA: Diagnosis not present

## 2019-12-10 LAB — CULTURE, BLOOD (ROUTINE X 2): Culture: NO GROWTH

## 2019-12-10 NOTE — ED Notes (Signed)
Pt refused lunch tray. Water at bedside for patient to drink as needed. Pt remains sleeping at this time. Pt denies any needs. Pt aware of need for urine sample. Urinal at bedside.

## 2019-12-10 NOTE — ED Notes (Signed)
Pt given breakfast tray

## 2019-12-10 NOTE — ED Notes (Signed)
Patient ate 100% of breakfast.  Patient is refusing meds.  This RN was able to give some of patient's meds by crushing in applesauce, although this RN had to add a lot of applesauce patient occasionally spit out small pill pieces.

## 2019-12-10 NOTE — ED Notes (Signed)
IVC/likely refer for gerispych

## 2019-12-10 NOTE — ED Notes (Signed)
Writer called [facility] to see if there were any updates regarding acceptance of patient. Staff reports that a call back would need to happen in about an hour. Writer will defer this to TTS first shift to handle.

## 2019-12-10 NOTE — ED Provider Notes (Signed)
Ongoing care assigned to Dr. Larinda Buttery  UA has been ordered and pending, follow-up results as patient not taking his oral cephalexin.      Sharyn Creamer, MD 12/10/19 [phone]

## 2019-12-10 NOTE — ED Provider Notes (Signed)
Emergency Medicine Observation Re-evaluation Note  Eric [name] is a 74 y.o. male, seen on rounds today.  Currently, the patient is   Physical Exam  BP 117/75   Pulse (!) 57   Temp 98.3 F (36.8 C) (Axillary)   Resp 20   Ht 6' (1.829 m)   Wt 131.5 kg   SpO2 96%   BMI 39.33 kg/m  Physical Exam  ED Course / MDM     I have reviewed the labs performed to date as well as medications administered while in observation.  Recent changes in the last 24 hours, none reported. Plan  Current plan is for psych placement. Patient is under full IVC at this time.   Sharyn Creamer, MD 12/10/19 [phone]

## 2019-12-10 NOTE — ED Notes (Signed)
Pt refusing to take medications. Pt tries to hide medications in bed. This RN attempted to mix medications with apple sauce and patient still refused to take. MD aware. Pt remains calm at this time. Pt given another dinner tray and water to drink.

## 2019-12-10 NOTE — BH Assessment (Addendum)
Referral status check:    Eric [name] ([phone]- [phone]), No answer     Eric [name] ([phone]--[phone]), Voicemail box full   [facility] [phone]) [phone] Eric [name] denied due to aggression   Eric [name] [phone]), Left voicemail    Eric [name] [phone], [phone], [phone] or [phone]), Transferred 4 x's, no help     Eric [name] [phone]), No answer    Eric [name] [phone] or [phone]), Transferred no answer   Eric [name] [phone] or [phone]), Eric [name] reports to call back later    Eric [name] [phone]). Left voicemail

## 2019-12-11 DIAGNOSIS — F0391 Unspecified dementia with behavioral disturbance: Secondary | ICD-10-CM | POA: Diagnosis not present

## 2019-12-11 LAB — URINALYSIS, COMPLETE (UACMP) WITH MICROSCOPIC
Bacteria, UA: NONE SEEN
Bilirubin Urine: NEGATIVE
Glucose, UA: NEGATIVE mg/dL
Hgb urine dipstick: NEGATIVE
Ketones, ur: NEGATIVE mg/dL
Leukocytes,Ua: NEGATIVE
Nitrite: NEGATIVE
Protein, ur: NEGATIVE mg/dL
Specific Gravity, Urine: 1.018 (ref 1.005–1.030)
Squamous Epithelial / HPF: NONE SEEN (ref 0–5)
pH: 7 (ref 5.0–8.0)

## 2019-12-11 NOTE — ED Notes (Signed)

## 2019-12-11 NOTE — ED Notes (Signed)
Pt. Alert and oriented, warm and dry, in no distress. Pt. Denies SI, HI, and AVH. Pt Pt. Encouraged to let nursing staff know of any concerns or needs. ° °

## 2019-12-11 NOTE — ED Notes (Signed)
Gave food tray with juice. 

## 2019-12-11 NOTE — TOC Progression Note (Signed)
Transition of Care [facility]) - Progression Note    Patient Details  Name: Edmar Blankenburg III MRN: [id number] Date of Birth: Aug 31, 1945  [facility] CM/SW Contact  [place] Cellar, RN Phone Number: 12/11/2019, 11:10 AM  Clinical Narrative:    TOC aware of consult. Patient pending placement with TTS in Geri-psych facility. Arrived from [facility]. Dressing applied to right heel.         Expected Discharge Plan and Services                                                 Social Determinants of Health (SDOH) Interventions    Readmission Risk Interventions Readmission Risk Prevention Plan 11/03/2018 10/31/2018  Transportation Screening Complete Complete  PCP or Specialist Appt within 5-7 Days Complete -  Home Care Screening Complete Complete  Medication Review (RN CM) Complete -  Some recent data might be hidden

## 2019-12-11 NOTE — ED Notes (Signed)
Changed pt and cleaned bed up. Pt is resting and watching TV now.

## 2019-12-11 NOTE — ED Notes (Signed)
Pt removed condom cath by self. Pt  cleaned up, brief changed, linens changed. Pt given ice water.

## 2019-12-11 NOTE — ED Provider Notes (Signed)
Emergency Medicine Observation Re-evaluation Note  Eric [name] is a 74 y.o. male, seen on rounds today.  Pt initially presented to the ED for complaints of Aggressive Behavior and Foot Pain Currently, the patient is resting in no acute distress.  Physical Exam  BP 140/71   Pulse 88   Temp 97.8 F (36.6 C) (Oral)   Resp 18   Ht 6' (1.829 m)   Wt 131.5 kg   SpO2 93%   BMI 39.33 kg/m  Physical Exam  ED Course / MDM  EKG:EKG Interpretation  Date/Time:  Wednesday Dec 05 2019 13:39:00 EDT Ventricular Rate:  67 PR Interval:  210 QRS Duration: 70 QT Interval:  398 QTC Calculation: 420 R Axis:   6 Text Interpretation: Sinus rhythm with marked sinus arrhythmia with 1st degree A-V block Otherwise normal ECG Confirmed by UNCONFIRMED, DOCTOR ([id number]), editor Fredric Mare, Tammy [phone]) on 12/06/2019 9:19:17 AM    I have reviewed the labs performed to date as well as medications administered while in observation.  Recent changes in the last 24 hours include no events overnight. Plan  Current plan is for pending placement. Patient is not under full IVC at this time.   Irean Hong, MD 12/11/19 [phone]

## 2019-12-12 DIAGNOSIS — F0391 Unspecified dementia with behavioral disturbance: Secondary | ICD-10-CM | POA: Diagnosis not present

## 2019-12-12 LAB — URINE CULTURE: Culture: NO GROWTH

## 2019-12-12 MED ORDER — HALOPERIDOL 0.5 MG PO TABS
0.2500 mg | ORAL_TABLET | Freq: Two times a day (BID) | ORAL | Status: DC
  Administered 2019-12-12 – 2019-12-13 (×2): 0.25 mg via ORAL
  Filled 2019-12-12 (×4): qty 0.5

## 2019-12-12 MED ORDER — OLANZAPINE 5 MG PO TABS
15.0000 mg | ORAL_TABLET | Freq: Every day | ORAL | Status: DC
  Administered 2019-12-12 – 2020-01-06 (×12): 15 mg via ORAL
  Filled 2019-12-12 (×19): qty 1

## 2019-12-12 NOTE — ED Notes (Signed)
Pt dry and clean at this time. Given lunch tray.

## 2019-12-12 NOTE — ED Notes (Signed)
Writer tried multiple times to give medications. Patient refused.

## 2019-12-12 NOTE — ED Notes (Signed)
Pt resting in bed. Attempted to place an external catheter but pt refused to keep it on. Pt was changed and new brief applied. Pt oxygen put back on. Pt continuously asks the same things despite staff attempting to reorient. Requesting breakfast. Pt given juice.

## 2019-12-12 NOTE — Progress Notes (Signed)
Patient ID: Eric [name], male   DOB: 1946-04-12, 74 y.o.   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    He is confused and is not fully oriented --his speech is pressured and slightly loud --his mood and affect --he is dysphoric and depressed and anxious  He is illogical and at times does not make sense.  He is angry and agitated if asked too many questions.  His rapport is poor ---he has poor eye contact and concentration and attention ---his judgement insight and reliability are impaired ---  His consciousness is not clouded or fluctuant but he has poor concentration and focus ----  MS limited due to cooperation.    He is anxious and depressed, he is complaining of leg swelling and dry skin being addressed in general   His medications were modified ---  With the addition of low dose haldol and moving Zyprexa to hs dosing ---  No other new labs , but is being treated for his leg issues and is on antibiotics    Awaits bed for gero psych at this time

## 2019-12-12 NOTE — ED Notes (Signed)
Pharm consult placed due to pt refusing meds. Will give meds based on what can and cannot be crushed into food/juice.

## 2019-12-12 NOTE — ED Notes (Signed)
Pt cleaned of urine. 

## 2019-12-12 NOTE — ED Notes (Signed)
Pt given meal tray and ate 60% 

## 2019-12-12 NOTE — ED Notes (Signed)
Psych and TTS at bedside. 

## 2019-12-12 NOTE — ED Notes (Signed)
Meds mixed in with food and juice. Will monitor to ensure pt eats it.

## 2019-12-12 NOTE — ED Notes (Signed)
Pt ate about 75% of food with meds and is currently still drinking the juice.

## 2019-12-12 NOTE — ED Notes (Signed)
Pt  Placed  Under  Set  2nd  Of  IVC PAPERS  PENDING  PLACEMENT

## 2019-12-12 NOTE — ED Provider Notes (Addendum)
Emergency Medicine Observation Re-evaluation Note  Eric [name] is a 74 y.o. male, seen on rounds today.  Pt initially presented to the ED for complaints of Aggressive Behavior and Foot Pain Currently, the patient is in NAD.  Physical Exam  BP 135/85 (BP Location: Left Arm)   Pulse 61   Temp 98.7 F (37.1 C) (Oral)   Resp 16   Ht 6' (1.829 m)   Wt 131.5 kg   SpO2 90%   BMI 39.33 kg/m  Physical Exam  ED Course / MDM  EKG:EKG Interpretation  Date/Time:  Wednesday Dec 05 2019 13:39:00 EDT Ventricular Rate:  67 PR Interval:  210 QRS Duration: 70 QT Interval:  398 QTC Calculation: 420 R Axis:   6 Text Interpretation: Sinus rhythm with marked sinus arrhythmia with 1st degree A-V block Otherwise normal ECG Confirmed by UNCONFIRMED, DOCTOR ([id number]), editor Fredric Mare, Tammy [phone]) on 12/06/2019 9:19:17 AM    I have reviewed the labs performed to date as well as medications administered while in observation.  Recent changes in the last 24 hours include none. Plan  Current plan is for placement. Patient is under full IVC at this time.   Sharman Cheek, MD 12/12/19 0901    Sharman Cheek, MD 12/12/19 [phone]

## 2019-12-13 DIAGNOSIS — F0391 Unspecified dementia with behavioral disturbance: Secondary | ICD-10-CM | POA: Diagnosis not present

## 2019-12-13 MED ORDER — HALOPERIDOL 0.5 MG PO TABS
0.2500 mg | ORAL_TABLET | Freq: Every day | ORAL | Status: DC
  Administered 2019-12-24 – 2019-12-26 (×2): 0.25 mg via ORAL
  Filled 2019-12-13 (×13): qty 0.5

## 2019-12-13 NOTE — ED Notes (Signed)
Pt awaken and given meal tray pt ate 100%. Pt was soiled. This EDT changed pt and linen.

## 2019-12-13 NOTE — ED Provider Notes (Signed)
Emergency Medicine Observation Re-evaluation Note  Tarence Searcy III is a 74 y.o. male, seen on rounds today.  Pt initially presented to the ED for complaints of Aggressive Behavior and Foot Pain Currently, the patient is in NAD .  Physical Exam  BP 113/67 (BP Location: Left Arm)   Pulse 60   Temp (!) 97.4 F (36.3 C) (Oral)   Resp 16   Ht 6' (1.829 m)   Wt 131.5 kg   SpO2 96%   BMI 39.33 kg/m  Physical Exam  ED Course / MDM  EKG:EKG Interpretation  Date/Time:  Wednesday Dec 05 2019 13:39:00 EDT Ventricular Rate:  67 PR Interval:  210 QRS Duration: 70 QT Interval:  398 QTC Calculation: 420 R Axis:   6 Text Interpretation: Sinus rhythm with marked sinus arrhythmia with 1st degree A-V block Otherwise normal ECG Confirmed by UNCONFIRMED, DOCTOR ([id number]), editor Fredric Mare, Tammy [phone]) on 12/06/2019 9:19:17 AM     Plan  Current plan is for placement Patient is under full IVC at this time.   Concha Se, MD 12/13/19 [phone]

## 2019-12-13 NOTE — ED Notes (Signed)
Scheduled med given, breakfast provided.

## 2019-12-13 NOTE — ED Notes (Signed)
Patient changed by this Clinical research associate and ED tech Mayra.

## 2019-12-13 NOTE — Progress Notes (Signed)
Patient ID: Eric [name], male   DOB: 05/15/1946, 74 y.o.   MRN: [id number]     Rama pemmaraju --Smith Robert MD  Psychiatry  Brief progress note 12/13/19  Patient is actually at baseline--now    Meds-- adjusted he is calmer and more cooperative remains in demented state IVC to be rescinded when bed placement to SNF can occur and he can be --transferred there   Tried to make supportive statements --  He is oriented to name part of date ---/not place   Meds adjusted    Not clouded or fluctuant  Seems dysphoric / MS is very limited  No clear SI or HI   No other new labs for now   Awaits ---SNF transfer

## 2019-12-13 NOTE — TOC Progression Note (Signed)
Transition of Care [facility]) - Progression Note    Patient Details  Name: Eric [name] MRN: [id number] Date of Birth: 1946-04-27  Transition of Care [facility]) CM/SW Contact  Marina Goodell Phone Number: [phone] 12/13/2019, 4:17 PM  Clinical Narrative:     CSW was updated today by TTS that patient has been psych cleared. Will reach out to [facility] for placement.       Expected Discharge Plan and Services                                                 Social Determinants of Health (SDOH) Interventions    Readmission Risk Interventions Readmission Risk Prevention Plan 11/03/2018 10/31/2018  Transportation Screening Complete Complete  PCP or Specialist Appt within 5-7 Days Complete -  Home Care Screening Complete Complete  Medication Review (RN CM) Complete -  Some recent data might be hidden

## 2019-12-13 NOTE — ED Notes (Signed)
Patient was awakened for breakfast, patient ate 100%. Went back to sleep.

## 2019-12-13 NOTE — ED Notes (Signed)
IVC/Pending Placement 

## 2019-12-13 NOTE — ED Notes (Signed)
Pt given grape juice and two cups of water. Pt dry and sleeping at this time.

## 2019-12-14 DIAGNOSIS — F0391 Unspecified dementia with behavioral disturbance: Secondary | ICD-10-CM | POA: Diagnosis not present

## 2019-12-14 LAB — SARS CORONAVIRUS 2 BY RT PCR (HOSPITAL ORDER, PERFORMED IN ~~LOC~~ HOSPITAL LAB): SARS Coronavirus 2: POSITIVE — AB

## 2019-12-14 NOTE — Evaluation (Signed)
Physical Therapy Evaluation Patient Details Name: Eric [name] MRN: [id number] DOB: 21-Jun-1946 Today's Date: 12/14/2019   History of Present Illness  74 year old male who comes in from facility due to aggressive behavior and foot pain.  He was hospitalized 1 month ago with right foot ulcer/infection with cellulitis. CT without definite osteomyelitis but per orthopedics evaluation location and symptoms consistent with osteomyelitis calcaneus, was needing PRAFO boot to offload ulcerative area, TDWB RLE during previous admit, no WBing orders at this time, boot not present in ED room. PMH: paroxysmal A. fib, hypertension, hypothyroidism, dementia, chronic combined systolic and diastolic congestive heart failure, hypertension  Clinical Impression  Pt initially (and sustained t/o session) uninterested or willing to work with PT but with persistent (and with varying forcefulness) cuing and encouragement he did agree to do some minimal activity.  Pt did not have boot with him today, did not have WBing orders or obvious open ulcers/wounds, PT and pt agree to do only minimal in-room ambulation, though pt likely could not have done much more anyway.  Pt with poor awareness t/o session but needed only minimal assist with most aspects of mobility, heavy reliance on the walker while up.  O2 (on 2L t/o) remained in the low 90s with the effort.  Pt would benefit from STR setting at discharge.    Follow Up Recommendations SNF    Equipment Recommendations  None recommended by PT    Recommendations for Other Services       Precautions / Restrictions Precautions Precautions: Fall Restrictions Weight Bearing Restrictions: No RLE Weight Bearing: (was on TDWB 1 month ago with boot)      Mobility  Bed Mobility Overal bed mobility: Needs Assistance Bed Mobility: Supine to Sit;Sit to Supine Rolling: Min assist   Supine to sit: Min assist Sit to supine: Min assist   General bed mobility comments: Pt  needed extra reinforcement and redirection to actually agree to get up, but once he made some effort he did not need a lot of assist  Transfers Overall transfer level: Needs assistance Equipment used: Rolling walker (2 wheeled) Transfers: Sit to/from Stand Sit to Stand: Min assist         General transfer comment: Pt unable to rise from lower bed height, put bed up 4-5" and instructed on UE use, he needed only light assist to rise but remained forward flexed and generally unable to tolerate much standing  Ambulation/Gait Ambulation/Gait assistance: Min assist Gait Distance (Feet): 20 Feet Assistive device: Rolling walker (2 wheeled)       General Gait Details: Pt with very slow and guarded ambulation with heavy forward leaning reliance on the walker.  He did not c/o foot pain but clearly could not tolerate much stance time.  Pt's O2 remained in the low 90s t/o the effort on 2 L though he did have some SOB and subjective fatigue  Stairs            Wheelchair Mobility    Modified Rankin (Stroke Patients Only)       Balance Overall balance assessment: Needs assistance Sitting-balance support: Single extremity supported Sitting balance-Leahy Scale: Fair       Standing balance-Leahy Scale: Poor Standing balance comment: highly reliant on walker, poor confidence but no overt LOBs                             Pertinent Vitals/Pain Pain Assessment: No/denies pain    Home  Living Family/patient expects to be discharged to:: Assisted living Living Arrangements: Alone             Home Equipment: (reports he does not use walker, unlikely.Marland KitchenMarland Kitchen?) Additional Comments: [place]    Prior Function           Comments: he is not very forthcoming with information, minimal interest in participating with information gathering, etc.  Reports he does not use walker (this seems unlikely at least recently with foot wound/issues).     Hand Dominance         Extremity/Trunk Assessment   Upper Extremity Assessment Upper Extremity Assessment: Generalized weakness    Lower Extremity Assessment Lower Extremity Assessment: Generalized weakness       Communication   Communication: HOH  Cognition Arousal/Alertness: Awake/alert Behavior During Therapy: Agitated(contrary to all requests, repeats "I ain't doing this sh!t") Overall Cognitive Status: Difficult to assess                                 General Comments: h/o dementia, here with agressive behavior, unsure how close to baseline he is      General Comments General comments (skin integrity, edema, etc.): Pt resistant to doing much of anything with PT, but with repeated cuing and encouragment he agreed to some minimal in-room ambulation and mobility    Exercises     Assessment/Plan    PT Assessment Patient needs continued PT services  PT Problem List Decreased strength;Decreased range of motion;Decreased activity tolerance;Decreased balance;Decreased mobility;Decreased cognition;Decreased knowledge of use of DME;Decreased safety awareness;Decreased knowledge of precautions;Cardiopulmonary status limiting activity;Decreased skin integrity       PT Treatment Interventions DME instruction;Gait training;Functional mobility training;Therapeutic activities;Therapeutic exercise;Balance training;Patient/family education;Wheelchair mobility training    PT Goals (Current goals can be found in the Care Plan section)  Acute Rehab PT Goals Patient Stated Goal: go back to bed PT Goal Formulation: Patient unable to participate in goal setting Time For Goal Achievement: 12/28/19 Potential to Achieve Goals: Fair    Frequency Min 2X/week   Barriers to discharge        Co-evaluation               AM-PAC PT "6 Clicks" Mobility  Outcome Measure Help needed turning from your back to your side while in a flat bed without using bedrails?: A Little Help needed moving  from lying on your back to sitting on the side of a flat bed without using bedrails?: A Little Help needed moving to and from a bed to a chair (including a wheelchair)?: A Little Help needed standing up from a chair using your arms (e.g., wheelchair or bedside chair)?: A Little Help needed to walk in hospital room?: A Lot Help needed climbing 3-5 steps with a railing? : Total 6 Click Score: 15    End of Session Equipment Utilized During Treatment: Oxygen;Gait belt Activity Tolerance: Patient limited by fatigue(pt not agitated, but contrary to all requested tasks) Patient left: with bed alarm set;with call bell/phone within reach;with nursing/sitter in room Nurse Communication: Mobility status PT Visit Diagnosis: Other abnormalities of gait and mobility (R26.89);Muscle weakness (generalized) (M62.81);Difficulty in walking, not elsewhere classified (R26.2)    Time: 1610-9604 PT Time Calculation (min) (ACUTE ONLY): 25 min   Charges:   PT Evaluation $PT Eval Low Complexity: 1 Low          Kreg Shropshire, DPT 12/14/2019, 8:55 AM

## 2019-12-14 NOTE — ED Notes (Signed)
Report given to Anne RN

## 2019-12-14 NOTE — TOC Progression Note (Signed)
Transition of Care [facility]) - Progression Note    Patient Details  Name: [name] MRN: [id number] Date of Birth: 1945-11-22  Transition of Care [facility]) CM/SW Contact  Marina Goodell Phone Number: [phone] 12/14/2019, 12:20 PM  Clinical Narrative:     CSW spoke with Tammy at [facility] and was told the patient will not be able to return to [facility] due to  Past behaviors.  This CSW contacted Tresa Endo at [facility] and was told there is a possibility for placement, once the paperwork is submitted and Medicaid 3-day waiver approval.  This CSW spoje with patient's legal guardian Eugenie Norrie, who chose [facility] as an alternative location for placement.  Expected Discharge Plan: Skilled Nursing Facility Barriers to Discharge: ED SNF auth  Expected Discharge Plan and Services Expected Discharge Plan: Skilled Nursing Facility In-house Referral: Clinical Social Work   Post Acute Care Choice: Skilled Nursing Facility Living arrangements for the past 2 months: Skilled Nursing Facility                                       Social Determinants of Health (SDOH) Interventions    Readmission Risk Interventions Readmission Risk Prevention Plan 11/03/2018 10/31/2018  Transportation Screening Complete Complete  PCP or Specialist Appt within 5-7 Days Complete -  Home Care Screening Complete Complete  Medication Review (RN CM) Complete -  Some recent data might be hidden

## 2019-12-14 NOTE — ED Notes (Signed)
IVC, pend placement 

## 2019-12-14 NOTE — ED Notes (Signed)
Patient changed into clean dry brief by this RN and by Trenton Founds NT. Pericare performed.

## 2019-12-14 NOTE — ED Notes (Signed)
This tech and Dewayne Hatch, RN assisted pt to recliner. Linen change was performed and dry brief placed on.

## 2019-12-14 NOTE — ED Notes (Signed)
Attempted medication administration several times.  Pt insistent that he will not take medications.

## 2019-12-14 NOTE — NC FL2 (Signed)
[place] LEVEL OF CARE SCREENING TOOL     IDENTIFICATION  Patient Name: Eric [name] Birthdate: [birth year] Sex: male Admission Date (Current Location): 12/05/2019  [facility] and [place] Number:  Engineering geologist and Address:  [facility], [address], [place], [place] [address]      Provider Number: [id number]  Attending Physician Name and Address:  No att. providers found  Relative Name and Phone Number:  Alvester Chou [phone] legal guardian    Current Level of Care: Hospital Recommended Level of Care: [place] Prior Approval Number:    Date Approved/Denied:   PASRR Number: [id number]  Discharge Plan: SNF    Current Diagnoses: Patient Active Problem List   Diagnosis Date Noted  . Cellulitis of right lower extremity   . Severe protein-calorie malnutrition ([place])   . Right foot ulcer ([place]) 11/16/2019  . Anxiety and depression   . Cellulitis of right foot   . 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 Chronic combined systolic and diastolic CHF (congestive heart failure) ([place]) 11/16/2018  . Anasarca 11/16/2018  . Hypothyroidism 11/16/2018  . PAF (paroxysmal atrial fibrillation) ([place]) 11/16/2018  . HTN (hypertension) 11/16/2018  . Generalized anxiety disorder 11/03/2018  . Major depression in partial remission ([place]) 11/03/2018  . Acute hypoxemic respiratory failure ([place]) 10/29/2018  . Suspected COVID-19 virus infection 10/29/2018    Orientation RESPIRATION BLADDER Height & Weight     Self, Situation  Normal Incontinent Weight: 290 lb (131.5 kg) Height:  6' (182.9 cm)  BEHAVIORAL SYMPTOMS/MOOD NEUROLOGICAL BOWEL NUTRITION STATUS  Verbally abusive   Incontinent Diet  AMBULATORY STATUS COMMUNICATION OF NEEDS Skin   Limited Assist Verbally Normal                       Personal Care Assistance Level of Assistance  Bathing, Feeding, Dressing Bathing  Assistance: Limited assistance Feeding assistance: Limited assistance Dressing Assistance: Limited assistance     Functional Limitations Info    Sight Info: Adequate Hearing Info: Adequate Speech Info: Adequate    SPECIAL CARE FACTORS FREQUENCY        PT Frequency: 5X              Contractures Contractures Info: Not present    Additional Factors Info                  Current Medications (12/14/2019):  This is the current hospital active medication list Current Facility-Administered Medications  Medication Dose Route Frequency Provider Last Rate Last Admin  . acetaminophen (TYLENOL) tablet 650 mg  650 mg Oral Q6H PRN Hinda Kehr, MD   650 mg at 12/08/19 0845  . albuterol (PROVENTIL) (2.5 MG/3ML) 0.083% nebulizer solution 2.5 mg  2.5 mg Inhalation Q6H PRN Hinda Kehr, MD      . ascorbic acid (VITAMIN C) tablet 500 mg  500 mg Oral BID Hinda Kehr, MD   500 mg at 12/13/19 2200  . collagenase (SANTYL) ointment   Topical Daily Hinda Kehr, MD   Given at 12/13/19 1454  . diazepam (VALIUM) tablet 2 mg  2 mg Oral QHS Hinda Kehr, MD   2 mg at 12/13/19 2200  . furosemide (LASIX) tablet 40 mg  40 mg Oral Daily Hinda Kehr, MD   40 mg at 12/13/19 0947  . haloperidol (HALDOL) tablet 0.25 mg  0.25 mg Oral QHS Eulas Post, MD      . levothyroxine (SYNTHROID)  tablet 200 mcg  200 mcg Oral QAC breakfast Loleta Rose, MD   200 mcg at 12/13/19 0946  . metoCLOPramide (REGLAN) tablet 5 mg  5 mg Oral Q6H PRN Loleta Rose, MD   5 mg at 12/08/19 0113  . multivitamin with minerals tablet 1 tablet  1 tablet Oral Q breakfast Loleta Rose, MD   1 tablet at 12/13/19 0946  . OLANZapine (ZYPREXA) tablet 15 mg  15 mg Oral QHS Roselind Messier, MD   15 mg at 12/13/19 2200  . sertraline (ZOLOFT) tablet 50 mg  50 mg Oral Daily Roselind Messier, MD   50 mg at 12/13/19 0947  . zinc sulfate capsule 220 mg  220 mg Oral Daily Loleta Rose, MD   220 mg at 12/13/19 7412   Current  Outpatient Medications  Medication Sig Dispense Refill  . acetaminophen (TYLENOL) 325 MG tablet Take 2 tablets (650 mg total) by mouth every 6 (six) hours as needed for mild pain (or Fever >/= 101). (Patient taking differently: Take 650 mg by mouth every 6 (six) hours as needed for mild pain or headache (fever >/= 101, or minor discomfort). )    . albuterol (VENTOLIN HFA) 108 (90 Base) MCG/ACT inhaler Inhale 2 puffs into the lungs every 6 (six) hours as needed for wheezing or shortness of breath. (Patient taking differently: Inhale 1 puff into the lungs every 6 (six) hours as needed for wheezing or shortness of breath. )    . alum & mag hydroxide-simeth (ANTACID) 200-200-20 MG/5ML suspension Take 30 mLs by mouth 4 (four) times daily as needed for indigestion or heartburn.    Marland Kitchen ascorbic acid (VITAMIN C) 500 MG tablet Take 500 mg by mouth 2 (two) times daily.    . collagenase (SANTYL) ointment Apply topically daily. Apply to right heel ulcer plus dry dressing change daily 15 g 0  . diazepam (VALIUM) 2 MG tablet Take 0.5-1 tablets (1-2 mg total) by mouth See admin instructions. Take 1 mg by mouth in the morning and 2 mg at bedtime 10 tablet 0  . diphenhydrAMINE (DIPHENHIST) 25 MG tablet Take 25 mg by mouth every 6 (six) hours as needed (for allergic reactions).    . furosemide (LASIX) 20 MG tablet Take 2 tablets (40 mg total) by mouth daily. 30 tablet 0  . guaiFENesin (ROBITUSSIN) 100 MG/5ML liquid Take 300 mg by mouth every 6 (six) hours as needed for cough.    . levothyroxine (SYNTHROID) 200 MCG tablet Take 1 tablet (200 mcg total) by mouth daily before breakfast.    . liver oil-zinc oxide (DESITIN) 40 % ointment Apply topically daily. (Patient taking differently: Apply 1 application topically See admin instructions. Apply to affected area once daily) 56.7 g 0  . loperamide (IMODIUM A-D) 2 MG tablet Take 4 mg by mouth every 3 (three) hours as needed for diarrhea or loose stools (CANNOT EXCEED 8 DOSES/24  HOURS).    . magnesium hydroxide (MILK OF MAGNESIA) 400 MG/5ML suspension Take 30 mLs by mouth daily as needed for mild constipation.    . metoCLOPramide (REGLAN) 5 MG tablet Take 5 mg by mouth every 6 (six) hours as needed for nausea or vomiting.    . Multiple Vitamins-Minerals (THERATRUM COMPLETE) TABS Take 1 tablet by mouth daily with breakfast.    . OLANZapine zydis (ZYPREXA) 10 MG disintegrating tablet Take 1 tablet (10 mg total) by mouth 2 (two) times daily. 60 tablet 0  . OXYGEN Inhale 2 L/min into the lungs See admin instructions. "  2 L/min, as tolerated"    . sertraline (ZOLOFT) 25 MG tablet Take 1 tablet (25 mg total) by mouth daily. 30 tablet 0  . Skin Protectants, Misc. (EUCERIN) cream Apply 1 application topically See admin instructions. Apply to both legs 2 times a day       Discharge Medications: Please see discharge summary for a list of discharge medications.  Relevant Imaging Results:  Relevant Lab Results:   Additional Information SS# [id number]  Joseph Art, LCSWA

## 2019-12-14 NOTE — ED Notes (Signed)
Dr. Derrill Kay and charge nurse Gearldine Bienenstock informed of positive Covid tests.

## 2019-12-14 NOTE — ED Notes (Signed)
Pt was checked for dryness, and was found to be wet. Myself and RN Celine Mans changed pt into clean and dry clothing.

## 2019-12-14 NOTE — ED Notes (Signed)
Rounded on Pt to check on his toilet needs, since he is non ambulatory. Pt was checked for dryness and found to be dry and clean. Pt was reminded to let us know if he needs to be changed at any time.

## 2019-12-14 NOTE — ED Provider Notes (Signed)
Emergency Medicine Observation Re-evaluation Note  Eric [name] is a 74 y.o. male, seen on rounds today.  Pt initially presented to the ED for complaints of Aggressive Behavior and Foot Pain Currently, the patient is resting  Physical Exam  BP 139/68   Pulse 84   Temp 97.8 F (36.6 C) (Oral)   Resp 19   Ht 6' (1.829 m)   Wt 131.5 kg   SpO2 94%   BMI 39.33 kg/m  Physical Exam  ED Course / MDM  EKG:EKG Interpretation  Date/Time:  Wednesday Dec 05 2019 13:39:00 EDT Ventricular Rate:  67 PR Interval:  210 QRS Duration: 70 QT Interval:  398 QTC Calculation: 420 R Axis:   6 Text Interpretation: Sinus rhythm with marked sinus arrhythmia with 1st degree A-V block Otherwise normal ECG Confirmed by UNCONFIRMED, DOCTOR ([id number]), editor Fredric Mare, Tammy [phone]) on 12/06/2019 9:19:17 AM    I have reviewed the labs performed to date as well as medications administered while in observation. . Plan  Current plan is for psych dispo. Patient is under full IVC at this time.   Arnaldo Natal, MD 12/14/19 [phone]

## 2019-12-15 DIAGNOSIS — F0391 Unspecified dementia with behavioral disturbance: Secondary | ICD-10-CM | POA: Diagnosis not present

## 2019-12-15 MED ORDER — HALOPERIDOL LACTATE 5 MG/ML IJ SOLN
10.0000 mg | Freq: Once | INTRAMUSCULAR | Status: AC
  Administered 2019-12-15: 10 mg via INTRAMUSCULAR
  Filled 2019-12-15: qty 2

## 2019-12-15 NOTE — ED Notes (Signed)
Sitting up in bed watching tv. Advised that we will be getting him oob for peri / oral care and he will bee sitting oob in recliner for lunch. Patient onboard with plan for the day.

## 2019-12-15 NOTE — ED Notes (Signed)
Sleeping comfortably. Safety maintained. Will continue to monitor.

## 2019-12-15 NOTE — ED Notes (Signed)
Hourly rounding reveals patient in room. No complaints, stable, in no acute distress. Q15 minute rounds and monitoring via Rover and Officer to continue.   

## 2019-12-15 NOTE — ED Notes (Signed)
Pt was checked by this tech for incontinence, pt was dry and said he didn't need to void at this time.

## 2019-12-15 NOTE — ED Notes (Signed)
Pt is sleeping restful, there is no sign of of rep or cardiac distressed assessed at this time will continue to monitor, reassess and intervene as needed.

## 2019-12-15 NOTE — ED Provider Notes (Signed)
Emergency Medicine Observation Re-evaluation Note  Eric [name] is a 74 y.o. male, seen on rounds today.  Pt initially presented to the ED for complaints of Aggressive Behavior and Foot Pain Currently, the patient is sleeping.  Physical Exam  BP 108/70 (BP Location: Right Arm)   Pulse 74   Temp 97.9 F (36.6 C) (Oral)   Resp 18   Ht 6' (1.829 m)   Wt 131.5 kg   SpO2 95%   BMI 39.33 kg/m  Physical Exam  ED Course / MDM  EKG:EKG Interpretation  Date/Time:  Wednesday Dec 05 2019 13:39:00 EDT Ventricular Rate:  67 PR Interval:  210 QRS Duration: 70 QT Interval:  398 QTC Calculation: 420 R Axis:   6 Text Interpretation: Sinus rhythm with marked sinus arrhythmia with 1st degree A-V block Otherwise normal ECG Confirmed by UNCONFIRMED, DOCTOR ([id number]), editor Fredric Mare, Tammy [phone]) on 12/06/2019 9:19:17 AM    I have reviewed the labs performed to date as well as medications administered while in observation.  Recent changes in the last 24 hours include none. Plan  Current plan is for psych placement. Patient is under full IVC at this time.   Don Perking, [place], MD 12/15/19 [phone]

## 2019-12-15 NOTE — ED Notes (Signed)
Patient awake early and pleasant. Requesting something to eat. Patient told breakfast comes by 0900, but I can give him something until breakfast arrives. Patient ate 2 bowls of cereal. Sitting up in bed watching TV. Safety maintained will continue to monitor.

## 2019-12-15 NOTE — ED Notes (Signed)
Patient ate cereal this morning. Sitting up in bed watching TV. Safety maintained. Will monitor.

## 2019-12-15 NOTE — ED Notes (Signed)
Assisted pt to recliner.  

## 2019-12-15 NOTE — ED Notes (Signed)
Breakfast provided.

## 2019-12-16 DIAGNOSIS — F0391 Unspecified dementia with behavioral disturbance: Secondary | ICD-10-CM | POA: Diagnosis not present

## 2019-12-16 MED ORDER — HALOPERIDOL LACTATE 5 MG/ML IJ SOLN
10.0000 mg | Freq: Once | INTRAMUSCULAR | Status: AC
  Administered 2019-12-16: 10 mg via INTRAMUSCULAR
  Filled 2019-12-16: qty 2

## 2019-12-16 MED ORDER — HALOPERIDOL LACTATE 5 MG/ML IJ SOLN
10.0000 mg | Freq: Once | INTRAMUSCULAR | Status: DC
  Filled 2019-12-16: qty 2

## 2019-12-16 MED ORDER — LORAZEPAM 2 MG PO TABS
2.0000 mg | ORAL_TABLET | Freq: Once | ORAL | Status: DC
  Filled 2019-12-16 (×2): qty 1

## 2019-12-16 NOTE — ED Notes (Signed)
Pt refused all meds even after multiple attempts. Pt states " I don't want any of that shit. " Pt wet, pt's changed into wine scrubs, pt cleaned, all bedding changed.

## 2019-12-16 NOTE — ED Notes (Signed)
Report to include Situation, Background, Assessment, and Recommendations received from RN Jillyn Hidden & RN Onalee Hua. Patient alert and oriented, warm and dry, in no acute distress. Patient denies SI, HI, AVH and pain. Patient made aware of Q15 minute rounds and Psychologist, counselling presence for their safety. Patient instructed to come to me with needs or concerns.

## 2019-12-16 NOTE — ED Notes (Signed)
pt continues to attempt to get OOB. Pt continues to refuse po meds. Pt given IM haldol. Pt agitated and states he will not take any pills.

## 2019-12-16 NOTE — ED Notes (Signed)
Pt up to chair, calm at present.

## 2019-12-16 NOTE — TOC Progression Note (Signed)
Transition of Care [facility]) - Progression Note    Patient Details  Name: Eric [name] MRN: [id number] Date of Birth: Nov 02, 1945  Transition [facility]) CM/SW Contact  Larwance Rote, LCSW Phone Number: 12/16/2019, 9:46 AM  Clinical Narrative:   SARS Coronavirus 2 results positive 12/14/2019 This patient was scheduled to be discharged to [facility]. SNF.  Due to positive covid-19 test [facility] cannot accept the patient.   CSW spoke to [facility] who noted that they are not accepting COVID-19 positive patients.   Expected Discharge Plan: Skilled Nursing Facility Barriers to Discharge: ED SNF auth  Expected Discharge Plan and Services Expected Discharge Plan: Skilled Nursing Facility In-house Referral: Clinical Social Work   Post Acute Care Choice: Skilled Nursing Facility Living arrangements for the past 2 months: Skilled Nursing Facility                   Social Determinants of Health (SDOH) Interventions    Readmission Risk Interventions Readmission Risk Prevention Plan 11/03/2018 10/31/2018  Transportation Screening Complete Complete  PCP or Specialist Appt within 5-7 Days Complete -  Home Care Screening Complete Complete  Medication Review (RN CM) Complete -  Some recent data might be hidden

## 2019-12-16 NOTE — ED Notes (Signed)
Pt given lunch tray.

## 2019-12-16 NOTE — ED Notes (Signed)
Pt sitting up and eating mango icey, denies toilet need; pt tapping his leg reports, fall and violence risk passed to charge RN

## 2019-12-16 NOTE — ED Notes (Signed)
Hourly rounding reveals patient in room. No complaints, stable, in no acute distress. Q15 minute rounds and monitoring via Security Cameras to continue. 

## 2019-12-16 NOTE — ED Notes (Signed)
Pt refused all meds, states" I don't want any of that damn medicine." Pt encouraged to take meds, given verbal support. Pt continues to refuse.

## 2019-12-16 NOTE — ED Notes (Signed)
Hourly rounding reveals patient in room. No complaints, stable, in no acute distress. Q15 minute rounds and monitoring via Rover and Officer to continue.   

## 2019-12-16 NOTE — ED Provider Notes (Signed)
Emergency Medicine Observation Re-evaluation Note  Cove Haydon III is a 74 y.o. male, seen on rounds today.  Pt initially presented to the ED for complaints of Aggressive Behavior and Foot Pain Currently, the patient is resting in no acute distress.  Physical Exam  BP 116/86 (BP Location: Left Arm)   Pulse 72   Temp 98.6 F (37 C) (Oral)   Resp 18   Ht 6' (1.829 m)   Wt 131.5 kg   SpO2 95%   BMI 39.33 kg/m  Physical Exam  ED Course / MDM  EKG:EKG Interpretation  Date/Time:  Wednesday Dec 05 2019 13:39:00 EDT Ventricular Rate:  67 PR Interval:  210 QRS Duration: 70 QT Interval:  398 QTC Calculation: 420 R Axis:   6 Text Interpretation: Sinus rhythm with marked sinus arrhythmia with 1st degree A-V block Otherwise normal ECG Confirmed by UNCONFIRMED, DOCTOR ([id number]), editor Fredric Mare, Tammy [phone]) on 12/06/2019 9:19:17 AM    I have reviewed the labs performed to date as well as medications administered while in observation.  Recent changes in the last 24 hours include no events overnight. Plan  Current plan is for psychiatric disposition. Patient is under full IVC at this time.   Irean Hong, MD 12/16/19 [phone]

## 2019-12-16 NOTE — ED Notes (Addendum)
Pt got up from chair, pt assisted back to bed. Pt states he cannot breathe. Pt given O2 2 L [place], O2 sats 91% on room air. Pt states he cannot wear O2 [place] because he is scared of it. Pt given verbal reassurance, pt continues to refuse O2. Replaced O2 [place] with pt's permission, sats increased to 93 % on 2 L [place]

## 2019-12-16 NOTE — ED Notes (Signed)
Pt helped back to bed. Pt keeps removing [place]. Pt reminded to keep [place] on. Pt put [place] back at present.

## 2019-12-16 NOTE — ED Notes (Signed)
Pt up in chair, lunch tray given, pt tolerating well.

## 2019-12-16 NOTE — ED Notes (Signed)
Pt expressing anger at IM injections; pt unable to to partner with this RN for positive outcome, pt states not wanting anymore shot but unwilling to consent to medicine administration

## 2019-12-16 NOTE — ED Notes (Signed)
Pt up to chair, O2 sat 88-90%  On room air, O2 2 L [place] applied, O2 sats increased to 93%. Pt leaving [place] on at present.

## 2019-12-17 DIAGNOSIS — F0391 Unspecified dementia with behavioral disturbance: Secondary | ICD-10-CM | POA: Diagnosis not present

## 2019-12-17 MED ORDER — ZIPRASIDONE MESYLATE 20 MG IM SOLR
10.0000 mg | Freq: Once | INTRAMUSCULAR | Status: AC
  Administered 2019-12-17: 10 mg via INTRAMUSCULAR
  Filled 2019-12-17: qty 20

## 2019-12-17 NOTE — ED Notes (Addendum)
Pt c/o "dying of hunger" pt provided with graham crackers as requested and more ice water  Pt seems to do better (more calm and less OOB attempts) when staff sitting beside pt otherwise pt calls out to staff in hall or at RN station to come here or pt attempts to leave bed  [place] on

## 2019-12-17 NOTE — ED Notes (Signed)
Pt with increasing agitation and fall risk increased with last med admin, EDP aware orders recieved

## 2019-12-17 NOTE — ED Notes (Signed)
IVC/  PENDING  PLACEMENT 

## 2019-12-17 NOTE — ED Notes (Signed)
Ann, RN sitting beside pt to redirect an calm pt; pt appears to be unable sleep

## 2019-12-17 NOTE — ED Notes (Signed)
Pt admitted to needing meds and had initially consented to taking meds but then once presented with the apple sauce and meds refused

## 2019-12-17 NOTE — ED Notes (Signed)
Pt using abusive language and racial epithets towards staff, this RN unable to get pt to verbalize a positive pt outcome

## 2019-12-17 NOTE — ED Notes (Signed)
Pt reports hungry; given potato, peas and [place] with gravy tray, pt at 100%

## 2019-12-17 NOTE — ED Notes (Signed)
Pt refusing to take am meds at this time. Pt will wake up to respond to RN, but then returns back to sleep. Pt respirations even and unlabored at this time. Pt appears to be in NAD

## 2019-12-17 NOTE — ED Notes (Signed)
Pt reports "I gotta shit", pt very unsteady , assisted to bedside  toilet with Mel[place], EDT, pt sat for 10 min and produced solid stool, pt cleaned and new brief applied, pt dressing from right rear of leg replaced

## 2019-12-17 NOTE — ED Notes (Signed)
Food tray was given with juice. 

## 2019-12-17 NOTE — ED Notes (Signed)
Pt now awake and eating meal tray at this time. Pt still refusing to take night time medications. Pt calm but non-cooperative at this time. Pt continues to take nasal cannula out of nose and continually redirected to leave oxygen in nose.

## 2019-12-17 NOTE — ED Notes (Signed)
Beth, EDT, sitting at bedside pt able to sleep for no more than 5-10 minutes before waking and interacting with staff, or attempting to get OOB

## 2019-12-17 NOTE — ED Notes (Signed)
Pt given breakfast tray

## 2019-12-17 NOTE — ED Notes (Signed)
This RN tried to awake pt to eat meal tray. Pt refusing to eat at this time. Pt remains calm. Respirations even and unlabored. Pt reminded to keep oxygen in nose.

## 2019-12-17 NOTE — ED Notes (Signed)
Pt continues to sleep at this time. Meal tray placed beside pt for pt when he awakes. Respirations remain even and unlabored

## 2019-12-17 NOTE — ED Notes (Addendum)
Pt awake att with intermittent sleeping; pt removes nasal cannula after staff leaves room, reapplied O2  sats at 88% without O2, 97% with 3 lpm [place]  This RN offered to closed door to help pt relax, pt refused and speaks to staff in hallway

## 2019-12-17 NOTE — ED Notes (Signed)
Pt with yet another out of bed attempt sitting at end of bed, this RN and Engineer, production, assessed pt brief dry, provided water at bedside and pt returned to

## 2019-12-17 NOTE — ED Notes (Signed)
Patient given cereal. Repositioned and sitting up in bed eating. Patient 89% on RA. Patient agreeable to wear O2 for a few minutes.

## 2019-12-17 NOTE — ED Provider Notes (Signed)
Emergency Medicine Observation Re-evaluation Note  Eric [name] is a 74 y.o. male, seen on rounds today.  Pt initially presented to the ED for complaints of Aggressive Behavior and Foot Pain Currently, the patient is sleeping.  Physical Exam  BP 130/73 (BP Location: Right Arm)   Pulse 77   Temp 98.6 F (37 C) (Oral)   Resp 18   Ht 6' (1.829 m)   Wt 131.5 kg   SpO2 97%   BMI 39.33 kg/m  Physical Exam  ED Course / MDM  EKG:EKG Interpretation  Date/Time:  Wednesday Dec 05 2019 13:39:00 EDT Ventricular Rate:  67 PR Interval:  210 QRS Duration: 70 QT Interval:  398 QTC Calculation: 420 R Axis:   6 Text Interpretation: Sinus rhythm with marked sinus arrhythmia with 1st degree A-V block Otherwise normal ECG Confirmed by UNCONFIRMED, DOCTOR ([id number]), editor Fredric Mare, Tammy [phone]) on 12/06/2019 9:19:17 AM    I have reviewed the labs performed to date as well as medications administered while in observation.  Recent changes in the last 24 hours include agitation requiring 10mg  of IM Geodon as patient refused PO meds. Plan  Current plan is for psych dispo. Patient is under full IVC at this time.   , Don Perking, MD 12/17/19 [phone]

## 2019-12-18 NOTE — ED Notes (Signed)
Pt given breakfast tray

## 2019-12-18 NOTE — ED Notes (Signed)
Pt sitting up at edge of bed. Pt eating with bed side table placed in front of pt. Pt given OJ.

## 2019-12-18 NOTE — ED Notes (Signed)
Pt sat up on side of bed to eat dinner.

## 2019-12-18 NOTE — ED Notes (Addendum)
Pt had saturated brief and linens. pt cleaned up and given bed bath with EDT Joni Reining. Clean brief placed on pt. Pt placed in clean gown. New linens placed on bed with chux. Lotion applied to pt's entire body. Pt given warm blankets. Pt pulled up in bed and repositioned.  VS updated at this time.

## 2019-12-18 NOTE — ED Notes (Signed)
IVC with papers expiring on 6/2 per Dr Smith Robert, papers won't be renewed as pt will need SNF placement.

## 2019-12-18 NOTE — ED Notes (Signed)
Pt given meal tray.

## 2019-12-18 NOTE — ED Notes (Signed)
Per Dr. Smith Robert he will let IVC papers expire as patient is pending skilled facility placement [id number]

## 2019-12-18 NOTE — ED Notes (Signed)
Pt had saturated self with urine, this nurse and tech, Caitlyn cleaned pt of urine. New sheets placed on bed, pt scrubs changed, new brief on pt and clean blankets provided. Pt tolerated change well, rolled on own. Denies any needs at this time. Call light in reach at his time

## 2019-12-18 NOTE — ED Notes (Signed)
Pt checked and dry at this time. Pt sitting on edge of bed. Pt placed on monitor to recheck vitals

## 2019-12-18 NOTE — ED Provider Notes (Signed)
Emergency Medicine Observation Re-evaluation Note  Eric [name] is a 74 y.o. male, seen on rounds today.  Pt initially presented to the ED for complaints of Aggressive Behavior and Foot Pain Currently, the patient is s.eeping.  Physical Exam  BP 138/86 (BP Location: Right Arm)   Pulse 76   Temp 98.3 F (36.8 C) (Oral)   Resp 17   Ht 1.829 m (6')   Wt 131.5 kg   SpO2 99%   BMI 39.33 kg/m  Physical Exam  ED Course / MDM  EKG:EKG Interpretation  Date/Time:  Wednesday Dec 05 2019 13:39:00 EDT Ventricular Rate:  67 PR Interval:  210 QRS Duration: 70 QT Interval:  398 QTC Calculation: 420 R Axis:   6 Text Interpretation: Sinus rhythm with marked sinus arrhythmia with 1st degree A-V block Otherwise normal ECG Confirmed by UNCONFIRMED, DOCTOR ([id number]), editor Fredric Mare, Tammy [phone]) on 12/06/2019 9:19:17 AM    I have reviewed the labs performed to date as well as medications administered while in observation.  Recent changes in the last 24 hours include refusal of nighttime medications.  Of note, the patient tested positive for COVID-19 on 12/14/2019, but I see no mention of this in prior documentation (although I may have missed it due to so many notes being in the system).  I added the appropriate FYI flag and note in the Comments section on the Trackboard. Plan  Current plan is for psych disposition. Patient is under full IVC at this time.   Loleta Rose, MD 12/18/19 [phone]

## 2019-12-18 NOTE — ED Notes (Signed)
Spoke with CM pt is still waiting placement given his recent COVID positive result. [facility]  not currently accepting any COVID positive pt's at this time. CM states after 10 day allowed per [facility] pt should be able to go to that facility

## 2019-12-19 NOTE — ED Provider Notes (Signed)
Emergency Medicine Observation Re-evaluation Note  Eric [name] is a 74 y.o. male, seen on rounds today.  Pt initially presented to the ED for complaints of Aggressive Behavior and Foot Pain Currently, the patient is sleeping.  Physical Exam  BP 108/62   Pulse 79   Temp 98.3 F (36.8 C) (Oral)   Resp 19   Ht 1.829 m (6')   Wt 131.5 kg   SpO2 92%   BMI 39.33 kg/m  Physical Exam  ED Course / MDM  EKG:EKG Interpretation  Date/Time:  Wednesday Dec 05 2019 13:39:00 EDT Ventricular Rate:  67 PR Interval:  210 QRS Duration: 70 QT Interval:  398 QTC Calculation: 420 R Axis:   6 Text Interpretation: Sinus rhythm with marked sinus arrhythmia with 1st degree A-V block Otherwise normal ECG Confirmed by UNCONFIRMED, DOCTOR ([id number]), editor Fredric Mare, Tammy [phone]) on 12/06/2019 9:19:17 AM    I have reviewed the labs performed to date as well as medications administered while in observation.  Recent changes in the last 24 hours include nothing clinically relevant. Plan  Current plan is for psychiatric disposition. Patient is under full IVC at this time.   Loleta Rose, MD 12/19/19 [phone]

## 2019-12-19 NOTE — ED Notes (Signed)
Pt provided meal tray at this time.

## 2019-12-19 NOTE — ED Notes (Signed)
Pt attempting to get out of bed. Pt assisted to get into blue recliner beside bed within sight of this tech. Pt has no further needs at this time. Will continue to monitor Q15 min checks.

## 2019-12-19 NOTE — Progress Notes (Signed)
PT Cancellation Note  Patient Details Name: Eric [name] MRN: [id number] DOB: [birth year]   Cancelled Treatment:     PT attempt. 2nd attempt. Pt unwilling to participate requesting therapist return tomorrow. Max encouragement but pt states he is too tired.     Rushie Chestnut 12/19/2019, 4:31 PM

## 2019-12-19 NOTE — ED Notes (Signed)
Pt cooperative and calm sitting in chair. Pt's lights dimmed for comfort. Pt denies any needs at this time.

## 2019-12-19 NOTE — ED Notes (Signed)
Hourly rounding reveals patient in room. No complaints, stable, in no acute distress. Q15 minute rounds and monitoring via Security Cameras to continue. 

## 2019-12-19 NOTE — ED Notes (Signed)
Pt given breakfast tray

## 2019-12-19 NOTE — ED Notes (Signed)
Vol papers allowed to expire per Dr. Smith Robert  Patient waiting SW placement

## 2019-12-19 NOTE — ED Notes (Signed)
Pt incontinent of urine. Bed saturated. Pt was cleaned with bath wipes, clean brief and gown placed on pt, bed linen changed and pt was repositioned in the bed. Call light within reach. Pt has no further needs at this time. Will continue to monitor Q15 min checks.

## 2019-12-19 NOTE — ED Notes (Signed)
Report to include Situation, Background, Assessment, and Recommendations received from RN Sam. Patient alert and oriented, warm and dry, in no acute distress. Patient denies SI, HI, AVH and pain. Patient made aware of Q15 minute rounds and Psychologist, counselling presence for their safety. Patient instructed to come to me with needs or concerns.

## 2019-12-19 NOTE — ED Notes (Signed)
Pt requesting brief change after just being cleaned up. This RN changed pt brief with no issue.

## 2019-12-19 NOTE — ED Notes (Signed)
Pt lying in bed asleep at this time. Equal unlabored RR. Nothing needed from staff at this time

## 2019-12-20 DIAGNOSIS — F0391 Unspecified dementia with behavioral disturbance: Secondary | ICD-10-CM | POA: Diagnosis not present

## 2019-12-20 MED ORDER — LORAZEPAM 2 MG/ML IJ SOLN
1.0000 mg | Freq: Once | INTRAMUSCULAR | Status: AC
  Administered 2019-12-20: 1 mg via INTRAVENOUS
  Filled 2019-12-20: qty 1

## 2019-12-20 MED ORDER — LORAZEPAM 2 MG/ML IJ SOLN
1.0000 mg | Freq: Once | INTRAMUSCULAR | Status: AC
  Administered 2019-12-20: 1 mg via INTRAMUSCULAR
  Filled 2019-12-20: qty 1

## 2019-12-20 MED ORDER — HALOPERIDOL LACTATE 5 MG/ML IJ SOLN
10.0000 mg | Freq: Once | INTRAMUSCULAR | Status: DC
  Filled 2019-12-20: qty 2

## 2019-12-20 MED ORDER — DIPHENHYDRAMINE HCL 50 MG/ML IJ SOLN
50.0000 mg | Freq: Once | INTRAMUSCULAR | Status: AC
  Administered 2019-12-20: 50 mg via INTRAVENOUS
  Filled 2019-12-20: qty 1

## 2019-12-20 NOTE — ED Notes (Signed)
This RN administered ativan and pt stating, "you dumb bitch."

## 2019-12-20 NOTE — ED Notes (Addendum)
Pt assisted from recliner to bed at this time by this RN and Shawna Orleans, EDT. Pt also used walker for assistance. Pt bed alarm activated

## 2019-12-20 NOTE — ED Notes (Signed)
Pt taking off socks and throwing them and pt unwrapping bandages on feet at this time.

## 2019-12-20 NOTE — ED Notes (Signed)
Pt given sponge bath, wound on bottom of foot noted, applied cream to dry skin on legs and placed pt in new gown and sheets changed.  Pt had been incontinent of urine and stool which was cleaned.  Pt is calm and cooperative at this time

## 2019-12-20 NOTE — ED Notes (Signed)
Pt laying calmly at this time. Peri care done and depends changed. Pt also repositioned for comfort.

## 2019-12-20 NOTE — ED Notes (Signed)
Pt attempting to get out of bed, pt is being hostile toward staff, making verbal threats, cursing and grabbing at staff.  Refuses po meds

## 2019-12-20 NOTE — ED Notes (Signed)
Physical therapy at bedside

## 2019-12-20 NOTE — ED Notes (Signed)
Pt trying to get out of bed. Pt centered in the bed by this RN and EDT.

## 2019-12-20 NOTE — ED Notes (Signed)
Pt becoming increasingly agitated. Pt can be heard yelling from room. This RN at bedside. Pt stating, I need to get out of this damn bed." This RN reminding pt he needs to stay in bed due to being high fall risk. Pt stating, "Get me out of this damn bed you bitch. MD verbal order for 1mg  ativan.

## 2019-12-20 NOTE — ED Provider Notes (Signed)
Emergency Medicine Observation Re-evaluation Note  Eric [name] is a 74 y.o. male, seen on rounds today.  Pt initially presented to the ED for complaints of Aggressive Behavior and Foot Pain Currently, the patient is quiet and calm, received haldol 10 mg IM for agitation and aggression this morning  Physical Exam  BP 135/82 (BP Location: Right Arm)   Pulse 65   Temp 97.8 F (36.6 C)   Resp 20   Ht 1.829 m (6')   Wt 131.5 kg   SpO2 100%   BMI 39.33 kg/m  Physical Exam  ED Course / MDM  EKG:EKG Interpretation  Date/Time:  Wednesday Dec 05 2019 13:39:00 EDT Ventricular Rate:  67 PR Interval:  210 QRS Duration: 70 QT Interval:  398 QTC Calculation: 420 R Axis:   6 Text Interpretation: Sinus rhythm with marked sinus arrhythmia with 1st degree A-V block Otherwise normal ECG Confirmed by UNCONFIRMED, DOCTOR ([id number]), editor Fredric Mare, Tammy [phone]) on 12/06/2019 9:19:17 AM    I have reviewed the labs performed to date as well as medications administered while in observation.  Recent changes in the last 24 hours include IM medications for agitation. Plan  Current plan is for psychiatric disposition. Patient is under full IVC at this time.   Jene Every, MD 12/20/19 1357

## 2019-12-20 NOTE — ED Notes (Signed)
Pt consistently trying to get up, no able to be redirected. Meds given per EDP order.

## 2019-12-20 NOTE — ED Notes (Signed)
Pt given meal tray at this time 

## 2019-12-20 NOTE — ED Notes (Signed)
Physical therapist assisted pt to recliner. Pt door open and pt visualized at this time

## 2019-12-20 NOTE — ED Notes (Signed)
Pt required redirectiong multiple times as he was attempting to get out of bed.  Pt repositioned in bed, offered new gown and sheets which pt declined.  Offered toileting help which pt declined

## 2019-12-20 NOTE — Progress Notes (Signed)
Physical Therapy Treatment Patient Details Name: Eric [name] MRN: [id number] DOB: 15-Oct-1945 Today's Date: 12/20/2019    History of Present Illness 74 year old male who comes in from facility due to aggressive behavior and foot pain.  He was hospitalized 1 month ago with right foot ulcer/infection with cellulitis. CT without definite osteomyelitis but per orthopedics evaluation location and symptoms consistent with osteomyelitis calcaneus, was needing PRAFO boot to offload ulcerative area, TDWB RLE during previous admit, no WBing orders at this time, boot not present in ED room. PMH: paroxysmal A. fib, hypertension, hypothyroidism, dementia, chronic combined systolic and diastolic congestive heart failure, hypertension    PT Comments    Pt was able to do multiple short standing/turning bouts as well as one bout of ~30 ft of ambulation with walker.  Despite cuing he consistently showed forward flexed posture, poor awareness with AD use and generally did not move particularly well or confidently.  His O2 (on supplemental O2) remained in the 90s most of the time, though sats were reading in the high 80s briefly when he get back to sitting post ambulation.  Overall pt remains weak, functionally limited and unsafe/unsteady with standing/ambulation acts.  Will need further PT once discharged from the hospital.    Follow Up Recommendations  SNF     Equipment Recommendations  None recommended by PT    Recommendations for Other Services       Precautions / Restrictions Precautions Precautions: Fall Restrictions Weight Bearing Restrictions: No RLE Weight Bearing: (no specific orders, was TDWBing 1 month ago with boot)    Mobility  Bed Mobility Overal bed mobility: Needs Assistance Bed Mobility: Supine to Sit     Supine to sit: Mod assist     General bed mobility comments: Pt was able to initiate movement toward EOB, but strugged to get to sitting and ultimately needed considerable  assist to get to stiting  Transfers Overall transfer level: Needs assistance Equipment used: Rolling walker (2 wheeled) Transfers: Sit to/from Stand Sit to Stand: Min assist;Min guard         General transfer comment: 3 sperate sit to stand efforts with pt able to rise from elevated surface, but needing assist from standard bed height.  Ambulation/Gait Ambulation/Gait assistance: Min assist Gait Distance (Feet): 25 Feet Assistive device: Rolling walker (2 wheeled)       General Gait Details: Pt again with forward flexed, walker reliance, slow and shuffling gait.  Often needing assist to get walker appropriate positioned relative to COG.  Pt showed good effort but did endorse significant fatigue and w/o direct assist likely would have struggled to maintain balance as reliance on walker and ability to maintain appropriate position was difficult.   Stairs             Wheelchair Mobility    Modified Rankin (Stroke Patients Only)       Balance Overall balance assessment: Needs assistance Sitting-balance support: Single extremity supported Sitting balance-Leahy Scale: Fair       Standing balance-Leahy Scale: Poor Standing balance comment: highly reliant on walker, poor confidence but no overt LOBs though PT giving regular assist in keeping walker close/appropriate                            Cognition Arousal/Alertness: Awake/alert Behavior During Therapy: [facility] for tasks assessed/performed(pt much more pleasant today than previous session with me)  General Comments: h/o dementia, here with agressive behavior, relatively appropriate today      Exercises      General Comments General comments (skin integrity, edema, etc.): Pt relatively calm and compliant t/o the session, PT did tread lightly and allow him to ramble at times w/o interupting too much      Pertinent Vitals/Pain Pain Assessment: No/denies  pain    Home Living                      Prior Function            PT Goals (current goals can now be found in the care plan section) Progress towards PT goals: Progressing toward goals    Frequency    Min 2X/week      PT Plan Current plan remains appropriate    Co-evaluation              AM-PAC PT "6 Clicks" Mobility   Outcome Measure  Help needed turning from your back to your side while in a flat bed without using bedrails?: A Little Help needed moving from lying on your back to sitting on the side of a flat bed without using bedrails?: A Little Help needed moving to and from a bed to a chair (including a wheelchair)?: A Little Help needed standing up from a chair using your arms (e.g., wheelchair or bedside chair)?: A Little Help needed to walk in hospital room?: A Lot Help needed climbing 3-5 steps with a railing? : Total 6 Click Score: 15    End of Session Equipment Utilized During Treatment: Oxygen;Gait belt Activity Tolerance: Patient limited by fatigue Patient left: with chair alarm set;with nursing/sitter in room;with call bell/phone within reach Nurse Communication: Mobility status PT Visit Diagnosis: Other abnormalities of gait and mobility (R26.89);Muscle weakness (generalized) (M62.81);Difficulty in walking, not elsewhere classified (R26.2)     Time: 6808-8110 PT Time Calculation (min) (ACUTE ONLY): 33 min  Charges:  $Gait Training: 8-22 mins $Therapeutic Activity: 8-22 mins                     Malachi Pro, DPT 12/20/2019, 4:28 PM

## 2019-12-20 NOTE — ED Notes (Signed)
Brought pt his breakfast.  Pt continues to be aggressive and threatening and cursing toward staff.

## 2019-12-21 DIAGNOSIS — F0391 Unspecified dementia with behavioral disturbance: Secondary | ICD-10-CM | POA: Diagnosis not present

## 2019-12-21 LAB — CBC WITH DIFFERENTIAL/PLATELET
Abs Immature Granulocytes: 0.1 10*3/uL — ABNORMAL HIGH (ref 0.00–0.07)
Basophils Absolute: 0 10*3/uL (ref 0.0–0.1)
Basophils Relative: 0 %
Eosinophils Absolute: 0.1 10*3/uL (ref 0.0–0.5)
Eosinophils Relative: 1 %
HCT: 45.1 % (ref 39.0–52.0)
Hemoglobin: 15.2 g/dL (ref 13.0–17.0)
Immature Granulocytes: 1 %
Lymphocytes Relative: 8 %
Lymphs Abs: 0.8 10*3/uL (ref 0.7–4.0)
MCH: 31 pg (ref 26.0–34.0)
MCHC: 33.7 g/dL (ref 30.0–36.0)
MCV: 91.9 fL (ref 80.0–100.0)
Monocytes Absolute: 0.7 10*3/uL (ref 0.1–1.0)
Monocytes Relative: 7 %
Neutro Abs: 8.2 10*3/uL — ABNORMAL HIGH (ref 1.7–7.7)
Neutrophils Relative %: 83 %
Platelets: 190 10*3/uL (ref 150–400)
RBC: 4.91 MIL/uL (ref 4.22–5.81)
RDW: 14.1 % (ref 11.5–15.5)
WBC: 9.8 10*3/uL (ref 4.0–10.5)
nRBC: 0 % (ref 0.0–0.2)

## 2019-12-21 LAB — COMPREHENSIVE METABOLIC PANEL
ALT: 9 U/L (ref 0–44)
AST: 16 U/L (ref 15–41)
Albumin: 3.4 g/dL — ABNORMAL LOW (ref 3.5–5.0)
Alkaline Phosphatase: 67 U/L (ref 38–126)
Anion gap: 9 (ref 5–15)
BUN: 20 mg/dL (ref 8–23)
CO2: 27 mmol/L (ref 22–32)
Calcium: 8.5 mg/dL — ABNORMAL LOW (ref 8.9–10.3)
Chloride: 104 mmol/L (ref 98–111)
Creatinine, Ser: 1.11 mg/dL (ref 0.61–1.24)
GFR calc Af Amer: >60 mL/min (ref >60)
GFR calc non Af Amer: >60 mL/min (ref >60)
Glucose, Bld: 136 mg/dL — ABNORMAL HIGH (ref 70–99)
Potassium: 4.2 mmol/L (ref 3.5–5.1)
Sodium: 140 mmol/L (ref 135–145)
Total Bilirubin: 0.9 mg/dL (ref 0.3–1.2)
Total Protein: 6.9 g/dL (ref 6.5–8.1)

## 2019-12-21 MED ORDER — HALOPERIDOL LACTATE 5 MG/ML IJ SOLN
10.0000 mg | Freq: Once | INTRAMUSCULAR | Status: AC
  Administered 2019-12-21: 10 mg via INTRAMUSCULAR
  Filled 2019-12-21: qty 2

## 2019-12-21 NOTE — ED Provider Notes (Signed)
Emergency Medicine Observation Re-evaluation Note  Eric [name] is a 74 y.o. male, seen on rounds today.  Pt initially presented to the ED for complaints of Aggressive Behavior and Foot Pain Currently, the patient is resting in no acute distress.  Physical Exam  BP 124/84 (BP Location: Right Arm)   Pulse 76   Temp 98.7 F (37.1 C) (Oral)   Resp 16   Ht 6' (1.829 m)   Wt 131.5 kg   SpO2 95%   BMI 39.33 kg/m  Physical Exam  ED Course / MDM  EKG:EKG Interpretation  Date/Time:  Wednesday Dec 05 2019 13:39:00 EDT Ventricular Rate:  67 PR Interval:  210 QRS Duration: 70 QT Interval:  398 QTC Calculation: 420 R Axis:   6 Text Interpretation: Sinus rhythm with marked sinus arrhythmia with 1st degree A-V block Otherwise normal ECG Confirmed by UNCONFIRMED, DOCTOR ([id number]), editor Fredric Mare, Tammy [phone]) on 12/06/2019 9:19:17 AM    I have reviewed the labs performed to date as well as medications administered while in observation.  Recent changes in the last 24 hours include repeat CBC and CMP unremarkable. Plan  Current plan is for psychiatric disposition. Patient is under full IVC at this time.   Irean Hong, MD 12/21/19 [phone]

## 2019-12-21 NOTE — ED Notes (Addendum)
Pt removed oxygen again. RN went into room and pt said "get the fuck out of my room." RN asked if he could put his nasal canula back on so he can get oxygen. Pt yelled "Hell no, I said Hell no, get out." Pt "swatted" at [employer]. RN was not hurt and pt did not actually touch RN during the swatting attempt  Charge RN and provider notified. Provider to place order for Haldol. Provider states to give to pt and that he cannot refuse.

## 2019-12-21 NOTE — ED Notes (Signed)
Pt turned to his right side by this tech and Amy, RN. No incontinence episode at this time.

## 2019-12-21 NOTE — ED Provider Notes (Signed)
Patient is becoming aggressive again swatting at the nurses refusing his medication and grabbed onto the nurses and squeezed toward her.  I will give this gentleman another dose of Haldol IM.   Arnaldo Natal, MD 12/21/19 2020

## 2019-12-21 NOTE — ED Notes (Signed)
Pt is laying in bed. Pt denies SI and HI. Pt is eating in the bed with music on.

## 2019-12-21 NOTE — ED Notes (Signed)
Pt allowed ED tech to replace nasal canula on pt. Oxygen at 2LPM.

## 2019-12-21 NOTE — ED Notes (Signed)
Pt checked to make sure brief was dry, slightly wet so changed brief and put a pillow under right hip.

## 2019-12-21 NOTE — ED Notes (Signed)
Pt brief and linen changed by this Clinical research associate and EDT [employer]. This writer noticed reddening around pt buttocks and lower back so barrier cream put on by this Clinical research associate. RN Herbert Seta notified

## 2019-12-22 NOTE — ED Notes (Signed)
Pt brief is dry. Pt was turn to his right side.

## 2019-12-22 NOTE — ED Notes (Signed)
Report given to Dawn T RN 

## 2019-12-22 NOTE — ED Notes (Signed)
Lunch tray was given. Pt refused at this moment.

## 2019-12-22 NOTE — ED Notes (Signed)
Patient's brief was dry. Patient refused meal tray again and any snacks or drinks. Patient did drink 4oz of apple juice. Patient denies any discomfort.

## 2019-12-22 NOTE — ED Notes (Signed)
Pt cleansed of urine and stool incontinence by this RN. Repositioned in bed at this time. Pt started eating breakfast tray again. Pt denies further needs. Pt calm and cooperative with staff while changing soiled brief.

## 2019-12-22 NOTE — ED Notes (Signed)
Pt continues to sit up in bed, O2 placed back on patient at this time. Pt finished eating breakfast at this time.

## 2019-12-22 NOTE — ED Notes (Signed)
This RN to bedside at this time, pt refused to take all medications, this RN attempted to encourage patient to take PO meds pt states, "I said no, I'm not taking anything right now". This RN offered something to eat/drink. Pt refused.

## 2019-12-22 NOTE — ED Notes (Signed)
Pt given meal tray but pt states he does not want food. Pt does drink drinks presented to him.

## 2019-12-22 NOTE — ED Notes (Signed)
Patient woke easily when told his lunch is here. Patient refused any additional help.

## 2019-12-22 NOTE — ED Notes (Signed)
Pt repeatedly calling out "hey hey hey!" Upon arrival to bedside when asked what patient needs pt states "nothing now".

## 2019-12-22 NOTE — ED Notes (Signed)
This RN to bedside, offered patient bath, pt refused, pt placed back on baseline O2 at this time.

## 2019-12-22 NOTE — ED Notes (Signed)
This RN to bedside, pt noted to be awake, this RN offered patient food, something to drink, to turn TV on, pt refused all, VS obtained, VSS and WNL. Pt noted to be calm and cooperative at this time. Pt denies further needs at this time.

## 2019-12-22 NOTE — ED Notes (Signed)
Pt's O2 placed back on patient at this time, pt resting in bed with NAD noted, pt remains cooperative with staff.

## 2019-12-22 NOTE — ED Notes (Signed)
This tech offered pt to feed him food, pt continues to refuse.

## 2019-12-22 NOTE — ED Notes (Signed)
Color of top of head, where pt was resting it on the side rail, is changing back to pts normal color

## 2019-12-22 NOTE — ED Notes (Signed)
Pt repositioned in bed at this time by this RN and Gerilyn Pilgrim, EDT. Pt tolerated well. This RN attempted to turn TV on for patient, pt requested TV be turned off.

## 2019-12-22 NOTE — ED Provider Notes (Addendum)
Emergency Medicine Observation Re-evaluation Note  Eric [name] is a 74 y.o. male, seen on rounds today.  Pt initially presented to the ED for complaints of Aggressive Behavior and Foot Pain Currently, the patient is calm.  Physical Exam  BP (!) 119/58   Pulse (!) 58   Temp (!) 97.4 F (36.3 C) (Axillary)   Resp 16   Ht 6' (1.829 m)   Wt 131.5 kg   SpO2 95%   BMI 39.33 kg/m  Physical Exam  ED Course / MDM  EKG:EKG Interpretation  Date/Time:  Wednesday Dec 05 2019 13:39:00 EDT Ventricular Rate:  67 PR Interval:  210 QRS Duration: 70 QT Interval:  398 QTC Calculation: 420 R Axis:   6 Text Interpretation: Sinus rhythm with marked sinus arrhythmia with 1st degree A-V block Otherwise normal ECG Confirmed by UNCONFIRMED, DOCTOR ([id number]), editor Fredric Mare, Tammy [phone]) on 12/06/2019 9:19:17 AM    I have reviewed the labs performed to date as well as medications administered while in observation.  Recent changes in the last 24 hours include agitation at bedtime requiring IM haldol. Plan  Current plan is for pending placement in SNF.  Psychiatrically at baseline. Patient is not under full IVC at this time.   Sharman Cheek, MD 12/22/19 [phone]    Sharman Cheek, MD 12/22/19 [phone]

## 2019-12-22 NOTE — ED Notes (Signed)
Pt given breakfast tray at this time. Pt unhappy regarding being sat up in bed to eat, explained to patient by Gerilyn Pilgrim, EDT that patient needed to sit up to eat. Pt visualized eating breakfast at this time.

## 2019-12-23 NOTE — Progress Notes (Signed)
CSW attempted to reach patient's legal guardian Carly at [facility] without success - a voicemail was left requesting a return call.  CSW attempted to reach admissions at [facility] without success - a voicemail was left requesting a return call.  Edwin Dada, MSW, LCSW-A Transitions of Care  Clinical Social Worker  [facility] Emergency Departments  Medical ICU [phone]

## 2019-12-23 NOTE — ED Notes (Signed)
Dorian EDT sat pt up and gave him his dinner tray

## 2019-12-23 NOTE — ED Provider Notes (Signed)
Emergency Medicine Observation Re-evaluation Note  Eric [name] is a 74 y.o. male, seen on rounds today.  Pt initially presented to the ED for complaints of Aggressive Behavior and Foot Pain Currently, the patient is resting in no acute distress.  Physical Exam  BP (!) 172/106 (BP Location: Left Arm)   Pulse (!) 106   Temp 98.4 F (36.9 C) (Oral)   Resp 14   Ht 6' (1.829 m)   Wt 131.5 kg   SpO2 96%   BMI 39.33 kg/m  Physical Exam  ED Course / MDM  EKG:EKG Interpretation  Date/Time:  Wednesday Dec 05 2019 13:39:00 EDT Ventricular Rate:  67 PR Interval:  210 QRS Duration: 70 QT Interval:  398 QTC Calculation: 420 R Axis:   6 Text Interpretation: Sinus rhythm with marked sinus arrhythmia with 1st degree A-V block Otherwise normal ECG Confirmed by UNCONFIRMED, DOCTOR ([id number]), editor Fredric Mare, Tammy [phone]) on 12/06/2019 9:19:17 AM    I have reviewed the labs performed to date as well as medications administered while in observation.  Recent changes in the last 24 hours include no events overnight. Plan  Current plan is for placement. Patient is under full IVC at this time.   Eric Hong, MD 12/23/19 [phone]

## 2019-12-23 NOTE — ED Notes (Addendum)
Scott and Dorian, EDTs assisted this Rn to cleanse pt of urine and BM- pt given clean brief and clean gown- pt turned onto right side to prevent skin breakdown- pt had eaten 0% of lunch tray

## 2019-12-23 NOTE — ED Notes (Signed)
Pt given breakfast tray and repositioned in bed.  

## 2019-12-24 DIAGNOSIS — F0391 Unspecified dementia with behavioral disturbance: Secondary | ICD-10-CM | POA: Diagnosis not present

## 2019-12-24 NOTE — ED Notes (Signed)
Messaged CM regarding any news on pt's placement.

## 2019-12-24 NOTE — ED Notes (Signed)
VOL  PENDING  PLACEMENT 

## 2019-12-24 NOTE — ED Notes (Signed)
Pt given breakfast tray. Pt eating at this time.

## 2019-12-24 NOTE — ED Notes (Signed)
Pt given dinner tray.

## 2019-12-24 NOTE — ED Notes (Signed)
Pt checked and dry at this time. 

## 2019-12-24 NOTE — TOC Progression Note (Signed)
Transition of Care [facility]) - Progression Note    Patient Details  Name: Eric [name] MRN: [id number] Date of Birth: [birth year]  Transition [facility]) CM/SW Contact  Economy Cellar, RN Phone Number: 12/24/2019, 9:48 AM  Clinical Narrative:    Outreach to [facility] regarding patient out of COVID quarantine. Due to patient being traditional Medicare will have to start new authorization.  TOC will begin new auth immediately.    Expected Discharge Plan: Skilled Nursing Facility Barriers to Discharge: ED SNF auth  Expected Discharge Plan and Services Expected Discharge Plan: Skilled Nursing Facility In-house Referral: Clinical Social Work   Post Acute Care Choice: Skilled Nursing Facility Living arrangements for the past 2 months: Skilled Nursing Facility                                       Social Determinants of Health (SDOH) Interventions    Readmission Risk Interventions Readmission Risk Prevention Plan 11/03/2018 10/31/2018  Transportation Screening Complete Complete  PCP or Specialist Appt within 5-7 Days Complete -  Home Care Screening Complete Complete  Medication Review (RN CM) Complete -  Some recent data might be hidden

## 2019-12-24 NOTE — NC FL2 (Signed)
[place] LEVEL OF CARE SCREENING TOOL     IDENTIFICATION  Patient Name: Eric [name] Birthdate: 23-Sep-1945 Sex: male Admission Date (Current Location): 12/05/2019  [place] and [place] Number:  Engineering geologist and Address:  [facility], [address] [place], [place] [address]      Provider Number: [id number]  Attending Physician Name and Address:  No att. providers found  Relative Name and Phone Number:  Alvester Chou @ [place]    Current Level of Care: Hospital Recommended Level of Care: [place] Prior Approval Number:    Date Approved/Denied:   PASRR Number: [id number]  Discharge Plan: Home    Current Diagnoses: Patient Active Problem List   Diagnosis Date Noted  . Cellulitis of right lower extremity   . Severe protein-calorie malnutrition ([place])   . Right foot ulcer ([place]) 11/16/2019  . Anxiety and depression   . Cellulitis of right foot   . 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 Chronic combined systolic and diastolic CHF (congestive heart failure) ([place]) 11/16/2018  . Anasarca 11/16/2018  . Hypothyroidism 11/16/2018  . PAF (paroxysmal atrial fibrillation) ([place]) 11/16/2018  . HTN (hypertension) 11/16/2018  . Generalized anxiety disorder 11/03/2018  . Major depression in partial remission ([place]) 11/03/2018  . Acute hypoxemic respiratory failure ([place]) 10/29/2018  . Suspected COVID-19 virus infection 10/29/2018    Orientation RESPIRATION BLADDER Height & Weight     Self, Time, Situation, Place  Normal Incontinent Weight: 131.5 kg Height:  6' (182.9 cm)  BEHAVIORAL SYMPTOMS/MOOD NEUROLOGICAL BOWEL NUTRITION STATUS  Verbally abusive   Incontinent Diet  AMBULATORY STATUS COMMUNICATION OF NEEDS Skin   Limited Assist Verbally Normal                       Personal Care Assistance Level of Assistance  Bathing, Feeding, Dressing  Bathing Assistance: Limited assistance Feeding assistance: Limited assistance Dressing Assistance: Limited assistance     Functional Limitations Info  Sight, Hearing, Speech Sight Info: Adequate Hearing Info: Adequate Speech Info: Adequate    SPECIAL CARE FACTORS FREQUENCY  PT (By licensed PT), OT (By licensed OT)     PT Frequency: Min 5xweek OT Frequency: Min 5xweek            Contractures Contractures Info: Not present    Additional Factors Info                  Current Medications (12/24/2019):  This is the current hospital active medication list Current Facility-Administered Medications  Medication Dose Route Frequency Provider Last Rate Last Admin  . acetaminophen (TYLENOL) tablet 650 mg  650 mg Oral Q6H PRN Hinda Kehr, MD   650 mg at 12/24/19 0041  . albuterol (PROVENTIL) (2.5 MG/3ML) 0.083% nebulizer solution 2.5 mg  2.5 mg Inhalation Q6H PRN Hinda Kehr, MD      . ascorbic acid (VITAMIN C) tablet 500 mg  500 mg Oral BID Hinda Kehr, MD   500 mg at 12/24/19 0034  . collagenase (SANTYL) ointment   Topical Daily Hinda Kehr, MD   Given at 12/16/19 1059  . diazepam (VALIUM) tablet 2 mg  2 mg Oral QHS Hinda Kehr, MD   2 mg at 12/24/19 0035  . furosemide (LASIX) tablet 40 mg  40 mg Oral Daily Hinda Kehr, MD   40 mg at 12/13/19 0947  . haloperidol (HALDOL) tablet 0.25 mg  0.25 mg Oral QHS Eulas Post,  MD   Stopped at 12/20/19 2222  . haloperidol lactate (HALDOL) injection 10 mg  10 mg Intramuscular Once Jene Every, MD      . haloperidol lactate (HALDOL) injection 10 mg  10 mg Intramuscular Once Jene Every, MD   Stopped at 12/20/19 1002  . levothyroxine (SYNTHROID) tablet 200 mcg  200 mcg Oral QAC breakfast Loleta Rose, MD   200 mcg at 12/13/19 0946  . LORazepam (ATIVAN) tablet 2 mg  2 mg Oral Once Jene Every, MD      . metoCLOPramide [facility]) tablet 5 mg  5 mg Oral Q6H PRN Loleta Rose, MD   5 mg at 12/08/19 0113  . multivitamin with  minerals tablet 1 tablet  1 tablet Oral Q breakfast Loleta Rose, MD   1 tablet at 12/13/19 0946  . OLANZapine (ZYPREXA) tablet 15 mg  15 mg Oral QHS Roselind Messier, MD   15 mg at 12/24/19 0034  . sertraline (ZOLOFT) tablet 50 mg  50 mg Oral Daily Roselind Messier, MD   50 mg at 12/13/19 0947  . zinc sulfate capsule 220 mg  220 mg Oral Daily Loleta Rose, MD   220 mg at 12/13/19 7482   Current Outpatient Medications  Medication Sig Dispense Refill  . acetaminophen (TYLENOL) 325 MG tablet Take 2 tablets (650 mg total) by mouth every 6 (six) hours as needed for mild pain (or Fever >/= 101). (Patient taking differently: Take 650 mg by mouth every 6 (six) hours as needed for mild pain or headache (fever >/= 101, or minor discomfort). )    . albuterol (VENTOLIN HFA) 108 (90 Base) MCG/ACT inhaler Inhale 2 puffs into the lungs every 6 (six) hours as needed for wheezing or shortness of breath. (Patient taking differently: Inhale 1 puff into the lungs every 6 (six) hours as needed for wheezing or shortness of breath. )    . alum & mag hydroxide-simeth (ANTACID) 200-200-20 MG/5ML suspension Take 30 mLs by mouth 4 (four) times daily as needed for indigestion or heartburn.    Marland Kitchen ascorbic acid (VITAMIN C) 500 MG tablet Take 500 mg by mouth 2 (two) times daily.    . collagenase (SANTYL) ointment Apply topically daily. Apply to right heel ulcer plus dry dressing change daily 15 g 0  . diazepam (VALIUM) 2 MG tablet Take 0.5-1 tablets (1-2 mg total) by mouth See admin instructions. Take 1 mg by mouth in the morning and 2 mg at bedtime 10 tablet 0  . diphenhydrAMINE (DIPHENHIST) 25 MG tablet Take 25 mg by mouth every 6 (six) hours as needed (for allergic reactions).    . furosemide (LASIX) 20 MG tablet Take 2 tablets (40 mg total) by mouth daily. 30 tablet 0  . guaiFENesin (ROBITUSSIN) 100 MG/5ML liquid Take 300 mg by mouth every 6 (six) hours as needed for cough.    . levothyroxine (SYNTHROID) 200 MCG tablet Take  1 tablet (200 mcg total) by mouth daily before breakfast.    . liver oil-zinc oxide (DESITIN) 40 % ointment Apply topically daily. (Patient taking differently: Apply 1 application topically See admin instructions. Apply to affected area once daily) 56.7 g 0  . loperamide (IMODIUM A-D) 2 MG tablet Take 4 mg by mouth every 3 (three) hours as needed for diarrhea or loose stools (CANNOT EXCEED 8 DOSES/24 HOURS).    . magnesium hydroxide (MILK OF MAGNESIA) 400 MG/5ML suspension Take 30 mLs by mouth daily as needed for mild constipation.    . metoCLOPramide (REGLAN) 5  MG tablet Take 5 mg by mouth every 6 (six) hours as needed for nausea or vomiting.    . Multiple Vitamins-Minerals (THERATRUM COMPLETE) TABS Take 1 tablet by mouth daily with breakfast.    . OLANZapine zydis (ZYPREXA) 10 MG disintegrating tablet Take 1 tablet (10 mg total) by mouth 2 (two) times daily. 60 tablet 0  . OXYGEN Inhale 2 L/min into the lungs See admin instructions. "2 L/min, as tolerated"    . sertraline (ZOLOFT) 25 MG tablet Take 1 tablet (25 mg total) by mouth daily. 30 tablet 0  . Skin Protectants, Misc. (EUCERIN) cream Apply 1 application topically See admin instructions. Apply to both legs 2 times a day       Discharge Medications: Please see discharge summary for a list of discharge medications.  Relevant Imaging Results:  Relevant Lab Results:   Additional Information SS#[id number]  [place] Cellar, RN

## 2019-12-24 NOTE — ED Notes (Signed)
Pt offered medications. Pt refuses medication. Pt calm and cooperative. Pt denies any other needs.

## 2019-12-24 NOTE — ED Notes (Signed)
Pt given meal tray.

## 2019-12-24 NOTE — ED Notes (Signed)
Pt has bruise noted to left upper shoulder area. Pt unsure where it came from. Pt has some redness noted to back.  Will continue to rotate pt on sides to help prevent breakdown.

## 2019-12-24 NOTE — ED Provider Notes (Signed)
Emergency Medicine Observation Re-evaluation Note  Eric [name] is a 74 y.o. male, seen on rounds today.  Pt initially presented to the ED for complaints of Aggressive Behavior and Foot Pain Currently, the patient is resting.  Physical Exam  BP 123/85 (BP Location: Right Arm)    Pulse 85    Temp 97.8 F (36.6 C) (Oral)    Resp 16    Ht 6' (1.829 m)    Wt 131.5 kg    SpO2 98%    BMI 39.33 kg/m  Physical Exam  ED Course / MDM  EKG:EKG Interpretation  Date/Time:  Wednesday Dec 05 2019 13:39:00 EDT Ventricular Rate:  67 PR Interval:  210 QRS Duration: 70 QT Interval:  398 QTC Calculation: 420 R Axis:   6 Text Interpretation: Sinus rhythm with marked sinus arrhythmia with 1st degree A-V block Otherwise normal ECG Confirmed by UNCONFIRMED, DOCTOR ([id number]), editor Fredric Mare, Tammy [phone]) on 12/06/2019 9:19:17 AM    I have reviewed the labs performed to date as well as medications administered while in observation.  Recent changes in the last 24 hours include none. Plan  Current plan is for psych and SW/CM dispo. Patient is under full IVC at this time.   Willy Eddy, MD 12/24/19 [phone]

## 2019-12-24 NOTE — ED Notes (Addendum)
Per Bennie Dallas LCSWA they are reaching out to [employer] again. States she will keep [place] posted.

## 2019-12-24 NOTE — ED Notes (Signed)
Pt checked and dry at this time. Pt denies any other needs. Pt repositioned in the bed and remote given to pt. Pt watching TV at this time.

## 2019-12-24 NOTE — TOC Progression Note (Signed)
Transition of Care [facility]) - Progression Note    Patient Details  Name: Eric [name] MRN: [id number] Date of Birth: [birth year]  [facility]) CM/SW Contact   Cellar, RN Phone Number: 12/24/2019, 1:41 PM  Clinical Narrative:    Patient is now denied from [employer]. Will seek alternative options.    Expected Discharge Plan: Skilled Nursing Facility Barriers to Discharge: ED SNF auth  Expected Discharge Plan and Services Expected Discharge Plan: Skilled Nursing Facility In-house Referral: Clinical Social Work   Post Acute Care Choice: Skilled Nursing Facility Living arrangements for the past 2 months: Skilled Nursing Facility                                       Social Determinants of Health (SDOH) Interventions    Readmission Risk Interventions Readmission Risk Prevention Plan 11/03/2018 10/31/2018  Transportation Screening Complete Complete  PCP or Specialist Appt within 5-7 Days Complete -  Home Care Screening Complete Complete  Medication Review (RN CM) Complete -  Some recent data might be hidden

## 2019-12-24 NOTE — ED Notes (Addendum)
Pt given meal tray. Pt's tray set up and pt set up in bed and repositioned so he can eat.    Pt eating at this time without difficulty

## 2019-12-24 NOTE — ED Notes (Signed)
LCSW Edwin Dada messaged at this time to obtain updated on pt's placement and plan of care.

## 2019-12-24 NOTE — ED Notes (Signed)
Pt was changed, bedding changed.

## 2019-12-24 NOTE — ED Notes (Signed)
Pt cleaned up and new brief placed on pt. Pt placed in clean gown. Pt repositioned onto left side with pillow under right back.  Pt resting comfortably at this time.

## 2019-12-24 NOTE — ED Notes (Signed)
Pt cleaned up and clean brief placed. Pt placed in clean gown. Pt repositioned on left side with pillow behind back to relieve pressure on right side.

## 2019-12-25 DIAGNOSIS — F0391 Unspecified dementia with behavioral disturbance: Secondary | ICD-10-CM | POA: Diagnosis not present

## 2019-12-25 MED ORDER — ZIPRASIDONE MESYLATE 20 MG IM SOLR
10.0000 mg | Freq: Once | INTRAMUSCULAR | Status: AC
  Administered 2019-12-25: 10 mg via INTRAMUSCULAR

## 2019-12-25 NOTE — ED Notes (Signed)
Pt given a bed bath. Barrier cream and deodorant placed on it. Bed linen change. Pt repositioned to left side. Pt has no further needs at this time.

## 2019-12-25 NOTE — ED Notes (Signed)
Changed pt soiled brief and gown.

## 2019-12-25 NOTE — ED Notes (Signed)
He is voluntary  - social work consult is in progress - placement pending

## 2019-12-25 NOTE — ED Notes (Signed)
Pt provided with crushed meds in orange sherbet and strawberry italian ice. Pt consumed all meds.

## 2019-12-25 NOTE — Progress Notes (Signed)
Physical Therapy Treatment Patient Details Name: Eric [name] MRN: [id number] DOB: [birth year] Today's Date: 12/25/2019    History of Present Illness 74 year old male who comes in from facility due to aggressive behavior and foot pain.  He was hospitalized 1 month ago with right foot ulcer/infection with cellulitis. CT without definite osteomyelitis but per orthopedics evaluation location and symptoms consistent with osteomyelitis calcaneus, was needing PRAFO boot to offload ulcerative area, TDWB RLE during previous admit, no WBing orders at this time, boot not present in ED room. PMH: paroxysmal A. fib, hypertension, hypothyroidism, dementia, chronic combined systolic and diastolic congestive heart failure, hypertension    PT Comments    Pt was awake in supine upon arriving. He agrees to OOB activity with encouragement. He was able to exit R side of bed with min-mod assist. Stood to RW with min assist + moderate vcs for improved technique and sequencing. Tolerated ambulate 20 ft with RW + min assist. Pt does fatigue quickly and is at high fall risk. Tends to have slow shuffling gait pattern with poor flexed posture. He will benefit from continued skilled PT to address deficits and improve safe functional mobility. Pt was repositioned supine in bed at conclusion of session with RN aware of pt's abilities. Acute PT will continue to follow per POC.     Follow Up Recommendations  SNF     Equipment Recommendations  None recommended by PT    Recommendations for Other Services       Precautions / Restrictions Precautions Precautions: Fall Restrictions Weight Bearing Restrictions: No RLE Weight Bearing: Touchdown weight bearing(pt unable to adhere to proper wt bearing )    Mobility  Bed Mobility Overal bed mobility: Needs Assistance Bed Mobility: Supine to Sit;Sit to Supine Rolling: Min assist   Supine to sit: Mod assist Sit to supine: Mod assist   General bed mobility comments: PT  required min-mod assist to exit bed and mod assist once fatigued.   Transfers Overall transfer level: Needs assistance Equipment used: Rolling walker (2 wheeled) Transfers: Sit to/from Stand Sit to Stand: Min assist;From elevated surface         General transfer comment: min assist + increased time to stand from elevated bed height. max vcs for improved technique.  poor standing posture once standing  Ambulation/Gait Ambulation/Gait assistance: Min assist Gait Distance (Feet): 20 Feet Assistive device: Rolling walker (2 wheeled) Gait Pattern/deviations: Step-to pattern;Trunk flexed;Narrow base of support;Shuffle Gait velocity: decreased   General Gait Details: pt depends heavily on RW for support throughout all standing activity. He was able to ambulate 20 ft with flexed posture and shuffling gait kinematics. Overall tolerated well but verbilizes fatigue. pt very eager to return to bed after session/OOB activity   Stairs             Wheelchair Mobility    Modified Rankin (Stroke Patients Only)       Balance Overall balance assessment: Needs assistance Sitting-balance support: Feet supported Sitting balance-Leahy Scale: Fair     Standing balance support: Bilateral upper extremity supported Standing balance-Leahy Scale: Poor Standing balance comment: pt is high fall risk. Relys heavily on RW for support and balance                            Cognition Arousal/Alertness: Awake/alert Behavior During Therapy: Flat affect Overall Cognitive Status: Difficult to assess  General Comments: h/o dementia, here with agressive behavior, relatively appropriate today      Exercises      General Comments        Pertinent Vitals/Pain Pain Assessment: No/denies pain    Home Living                      Prior Function            PT Goals (current goals can now be found in the care plan section)  Acute Rehab PT Goals Patient Stated Goal: to rest after ambulation Progress towards PT goals: Progressing toward goals    Frequency    Min 2X/week      PT Plan Current plan remains appropriate    Co-evaluation              AM-PAC PT "6 Clicks" Mobility   Outcome Measure  Help needed turning from your back to your side while in a flat bed without using bedrails?: A Little Help needed moving from lying on your back to sitting on the side of a flat bed without using bedrails?: A Little Help needed moving to and from a bed to a chair (including a wheelchair)?: A Little Help needed standing up from a chair using your arms (e.g., wheelchair or bedside chair)?: A Little Help needed to walk in hospital room?: A Lot Help needed climbing 3-5 steps with a railing? : Total 6 Click Score: 15    End of Session Equipment Utilized During Treatment: Oxygen;Gait belt Activity Tolerance: Patient limited by fatigue Patient left: with chair alarm set;with nursing/sitter in room;with call bell/phone within reach Nurse Communication: Mobility status PT Visit Diagnosis: Other abnormalities of gait and mobility (R26.89);Muscle weakness (generalized) (M62.81);Difficulty in walking, not elsewhere classified (R26.2)     Time: 5621-3086 PT Time Calculation (min) (ACUTE ONLY): 18 min  Charges:  $Gait Training: 8-22 mins                     Julaine Fusi PTA 12/25/19, 2:04 PM

## 2019-12-25 NOTE — ED Provider Notes (Signed)
Emergency Medicine Observation Re-evaluation Note  Rusty Villella III is a 74 y.o. male, seen on rounds today.  Pt initially presented to the ED for complaints of Aggressive Behavior and Foot Pain Currently, the patient is sleeping.  Physical Exam  BP 125/79 (BP Location: Right Leg)   Pulse (!) 59   Temp (!) 97.5 F (36.4 C) (Oral)   Resp 18   Ht 6' (1.829 m)   Wt 131.5 kg   SpO2 96%   BMI 39.33 kg/m  Physical Exam  ED Course / MDM    I have reviewed the labs performed to date as well as medications administered while in observation.  Recent changes in the last 24 hours include agitation overnight for which patient required IM geodon for patient and staff's safety. Plan  Current plan is for psych disposition. Patient is under full IVC at this time.   Don Perking, [place], MD 12/25/19 [phone]

## 2019-12-25 NOTE — TOC Progression Note (Signed)
Transition of Care [facility]) - Progression Note    Patient Details  Name: Eric [name] MRN: [id number] Date of Birth: [birth year]  [facility]) CM/SW Contact  Marina Goodell Phone Number: [phone] 12/25/2019, 11:58 AM  Clinical Narrative:     CSW spoke with Eugenie Norrie DSS/legal guardian [phone], to update her on SNF placement status.  Ms. Blima Ledger suggested [facility] in [place] and this CSW sent the [facility] via the hub.  This CSW updated EDP/EDStaff.  Expected Discharge Plan: Skilled Nursing Facility Barriers to Discharge: ED SNF auth  Expected Discharge Plan and Services Expected Discharge Plan: Skilled Nursing Facility In-house Referral: Clinical Social Work   Post Acute Care Choice: Skilled Nursing Facility Living arrangements for the past 2 months: Skilled Nursing Facility                                       Social Determinants of Health (SDOH) Interventions    Readmission Risk Interventions Readmission Risk Prevention Plan 11/03/2018 10/31/2018  Transportation Screening Complete Complete  PCP or Specialist Appt within 5-7 Days Complete -  Home Care Screening Complete Complete  Medication Review (RN CM) Complete -  Some recent data might be hidden

## 2019-12-25 NOTE — ED Notes (Signed)
Pt repositioned in bed and given breakfast tray.

## 2019-12-25 NOTE — ED Notes (Signed)
Pt face shaved by this tech. Razors disposed of in the sharps container.

## 2019-12-25 NOTE — ED Notes (Signed)
Pt throwing legs over rails repeatedly, swinging at staff and screaming. Pt unwilling to stay in safe position in bed for safety. See MAR from interventions.

## 2019-12-25 NOTE — ED Notes (Signed)
Pt continuously screaming at staff, throwing legs over side rails, swinging at staff when helping to move pt back into a safe position in bed. Redirection and calming voices used without success. MD made aware.

## 2019-12-26 DIAGNOSIS — F0391 Unspecified dementia with behavioral disturbance: Secondary | ICD-10-CM | POA: Diagnosis not present

## 2019-12-26 MED ORDER — HALOPERIDOL LACTATE 5 MG/ML IJ SOLN
10.0000 mg | Freq: Once | INTRAMUSCULAR | Status: AC
  Administered 2019-12-26: 10 mg via INTRAMUSCULAR
  Filled 2019-12-26: qty 2

## 2019-12-26 MED ORDER — CLONAZEPAM 1 MG PO TABS
2.0000 mg | ORAL_TABLET | Freq: Every day | ORAL | Status: DC
  Administered 2019-12-26 – 2020-01-06 (×7): 2 mg via ORAL
  Filled 2019-12-26: qty 4
  Filled 2019-12-26: qty 2
  Filled 2019-12-26 (×6): qty 4
  Filled 2019-12-26: qty 2
  Filled 2019-12-26: qty 4
  Filled 2019-12-26: qty 2
  Filled 2019-12-26: qty 4

## 2019-12-26 MED ORDER — LORAZEPAM 2 MG/ML IJ SOLN
2.0000 mg | Freq: Once | INTRAMUSCULAR | Status: AC
  Administered 2019-12-26: 2 mg via INTRAVENOUS
  Filled 2019-12-26: qty 1

## 2019-12-26 MED ORDER — OLANZAPINE 10 MG IM SOLR
5.0000 mg | Freq: Four times a day (QID) | INTRAMUSCULAR | Status: DC | PRN
  Administered 2019-12-28 – 2020-01-05 (×2): 5 mg via INTRAMUSCULAR
  Filled 2019-12-26 (×5): qty 10

## 2019-12-26 MED ORDER — DIPHENHYDRAMINE HCL 50 MG/ML IJ SOLN: 50.0000 mg | Freq: Every evening | INTRAMUSCULAR | Status: DC | PRN

## 2019-12-26 MED ORDER — DIPHENHYDRAMINE HCL 50 MG/ML IJ SOLN
25.0000 mg | Freq: Once | INTRAMUSCULAR | Status: AC
  Administered 2019-12-26: 25 mg via INTRAVENOUS
  Filled 2019-12-26: qty 1

## 2019-12-26 MED ORDER — LORAZEPAM 1 MG PO TABS
1.0000 mg | ORAL_TABLET | Freq: Once | ORAL | Status: AC
  Administered 2019-12-26: 1 mg via ORAL
  Filled 2019-12-26: qty 1

## 2019-12-26 MED ORDER — HALOPERIDOL 2 MG PO TABS
2.0000 mg | ORAL_TABLET | Freq: Every day | ORAL | Status: DC
  Administered 2019-12-26 – 2020-01-07 (×10): 2 mg via ORAL
  Filled 2019-12-26 (×14): qty 1

## 2019-12-26 MED ORDER — DIPHENHYDRAMINE HCL 50 MG/ML IJ SOLN
50.0000 mg | Freq: Four times a day (QID) | INTRAMUSCULAR | Status: DC | PRN
  Administered 2019-12-28: 50 mg via INTRAMUSCULAR
  Filled 2019-12-26 (×2): qty 1

## 2019-12-26 NOTE — ED Notes (Signed)
Patient given Haldol, Benadryl, and Ativan IMs due to agitation and yelling out help me at the top of his lungs. Patient also attempted to get out of bed and was asked to stay in bed for safety reasons.

## 2019-12-26 NOTE — ED Notes (Signed)
Wound care provided on patient, did not wrap legs patient did not want it

## 2019-12-26 NOTE — ED Notes (Signed)
VOL  PENDING  PLACEMENT 

## 2019-12-26 NOTE — ED Notes (Signed)
Pt asleep, meal tray placed in rm.  

## 2019-12-26 NOTE — ED Provider Notes (Signed)
Emergency Medicine Observation Re-evaluation Note  Eric [name] is a 74 y.o. male, seen on rounds today.  Pt initially presented to the ED for complaints of Aggressive Behavior and Foot Pain Currently, the patient is resting.  Physical Exam  BP 116/64 (BP Location: Right Arm)   Pulse 78   Temp 98.4 F (36.9 C) (Axillary)   Resp 15   Ht 6' (1.829 m)   Wt 131.5 kg   SpO2 93%   BMI 39.33 kg/m  Physical Exam  ED Course / MDM  EKG:EKG Interpretation  Date/Time:  Wednesday Dec 05 2019 13:39:00 EDT Ventricular Rate:  67 PR Interval:  210 QRS Duration: 70 QT Interval:  398 QTC Calculation: 420 R Axis:   6 Text Interpretation: Sinus rhythm with marked sinus arrhythmia with 1st degree A-V block Otherwise normal ECG Confirmed by UNCONFIRMED, DOCTOR ([id number]), editor Fredric Mare, Eric [name] [phone]) on 12/06/2019 9:19:17 AM    I have reviewed the labs performed to date as well as medications administered while in observation.  Recent changes in the last 24 hours include none. Plan  Current plan is for psych eval. Patient is under full IVC at this time.   Eric Eddy, MD 12/26/19 [phone]

## 2019-12-26 NOTE — ED Notes (Signed)
Patient is wearing a nasal cannula and on 3L of oxygen, writer was not given report that he was on oxygen. MD Roxan Hockey informed. Patients 02 sats have been in uppers 80s when oxygen is off. Patient keeps pulling off oxygen and nasal cannula

## 2019-12-27 DIAGNOSIS — F0391 Unspecified dementia with behavioral disturbance: Secondary | ICD-10-CM | POA: Diagnosis not present

## 2019-12-27 NOTE — ED Provider Notes (Signed)
Emergency Medicine Observation Re-evaluation Note  Eric [name] is a 74 y.o. male, seen on rounds today.  Pt initially presented to the ED for complaints of Aggressive Behavior and Foot Pain Currently, the patient is resting with no complaints.  Physical Exam  BP 136/81 (BP Location: Right Arm)   Pulse 88   Temp 98.2 F (36.8 C) (Oral)   Resp 18   Ht 6' (1.829 m)   Wt 131.5 kg   SpO2 95%   BMI 39.33 kg/m  Physical Exam   Constitutional: Resting comfortably. Eyes: Conjunctivae are normal. Head: Atraumatic. Nose: No congestion/rhinnorhea. Mouth/Throat: Mucous membranes are moist. Neck: Normal ROM Cardiovascular: No cyanosis noted. Respiratory: Normal respiratory effort. Gastrointestinal: Non-distended. Genitourinary: deferred Musculoskeletal: No lower extremity tenderness nor edema. Neurologic:  Normal speech and language. No gross focal neurologic deficits are appreciated. Skin:  Skin is warm, dry and intact. No rash noted.    ED Course / MDM  EKG:EKG Interpretation  Date/Time:  Wednesday Dec 05 2019 13:39:00 EDT Ventricular Rate:  67 PR Interval:  210 QRS Duration: 70 QT Interval:  398 QTC Calculation: 420 R Axis:   6 Text Interpretation: Sinus rhythm with marked sinus arrhythmia with 1st degree A-V block Otherwise normal ECG Confirmed by UNCONFIRMED, DOCTOR ([id number]), editor Fredric Mare, Tammy [phone]) on 12/06/2019 9:19:17 AM    I have reviewed the labs performed to date as well as medications administered while in observation.  Recent changes in the last 24 hours include patient calmer after receiving IM medications. Plan  Current plan is for placement. Patient is under full IVC at this time.   Chesley Noon, MD 12/27/19 [phone]

## 2019-12-27 NOTE — ED Notes (Signed)
Patient changed by Clinical research associate and EDT, patient was very rude and kept saying to staff "dont hurt me, mutherfuckers", staff informed him we were trying to keep him safe. Patient has a red, tender area around his groin area, staff applied barrier cream to area

## 2019-12-27 NOTE — TOC Progression Note (Addendum)
Transition of Care [facility]) - Progression Note    Patient Details  Name: Eric [name] MRN: [id number] Date of Birth: [birth year]  Transition of Care [facility]) CM/SW Contact  Marina Goodell Phone Number: [phone] 12/27/2019, 2:13 PM  Clinical Narrative:     Patient is under IVC again.  This CSW reached out to EDP to confirm.  Patient will not find placement in SNF as long as he is under IVC. This CSW reached out to EDP and recommended a psych consult for reassessment for psych needs.  Expected Discharge Plan: Skilled Nursing Facility Barriers to Discharge: ED SNF auth  Expected Discharge Plan and Services Expected Discharge Plan: Skilled Nursing Facility In-house Referral: Clinical Social Work   Post Acute Care Choice: Skilled Nursing Facility Living arrangements for the past 2 months: Skilled Nursing Facility                                       Social Determinants of Health (SDOH) Interventions    Readmission Risk Interventions Readmission Risk Prevention Plan 11/03/2018 10/31/2018  Transportation Screening Complete Complete  PCP or Specialist Appt within 5-7 Days Complete -  Home Care Screening Complete Complete  Medication Review (RN CM) Complete -  Some recent data might be hidden

## 2019-12-27 NOTE — ED Notes (Signed)
Patient placed back on monitor, patient continues to take off his nasal cannula

## 2019-12-27 NOTE — TOC Progression Note (Signed)
Transition of Care [facility]) - Progression Note    Patient Details  Name: Eric [name] MRN: [id number] Date of Birth: Dec 08, 1945  Transition of Care [facility]) CM/SW Contact  Marina Goodell Phone Number: [phone] 12/27/2019, 1:46 PM  Clinical Narrative:     Patient has been declined for placement from several SNFs, the reasons have been mainly the patient's care needs exceed facility capacity or behavioral issues.  This CSW will continue to search for placement.   Expected Discharge Plan: Skilled Nursing Facility Barriers to Discharge: ED SNF auth  Expected Discharge Plan and Services Expected Discharge Plan: Skilled Nursing Facility In-house Referral: Clinical Social Work   Post Acute Care Choice: Skilled Nursing Facility Living arrangements for the past 2 months: Skilled Nursing Facility                                       Social Determinants of Health (SDOH) Interventions    Readmission Risk Interventions Readmission Risk Prevention Plan 11/03/2018 10/31/2018  Transportation Screening Complete Complete  PCP or Specialist Appt within 5-7 Days Complete -  Home Care Screening Complete Complete  Medication Review (RN CM) Complete -  Some recent data might be hidden

## 2019-12-27 NOTE — ED Notes (Signed)
Patient observed lying in bed with eyes closed  Even, unlabored respirations observed   NAD pt appears to be sleeping will continue to monitor along with every 15 minute visual observations and ongoing security monitoring    

## 2019-12-27 NOTE — ED Notes (Signed)
Pt given dinner tray.

## 2019-12-27 NOTE — ED Notes (Signed)
Writer applied patients cream on his feet bilaterally and propped them on a pillow

## 2019-12-28 ENCOUNTER — Emergency Department: Payer: Medicare Other

## 2019-12-28 DIAGNOSIS — F0391 Unspecified dementia with behavioral disturbance: Secondary | ICD-10-CM | POA: Diagnosis not present

## 2019-12-28 LAB — CBC
HCT: 45.4 % (ref 39.0–52.0)
Hemoglobin: 15 g/dL (ref 13.0–17.0)
MCH: 31.4 pg (ref 26.0–34.0)
MCHC: 33 g/dL (ref 30.0–36.0)
MCV: 95 fL (ref 80.0–100.0)
Platelets: 167 10*3/uL (ref 150–400)
RBC: 4.78 MIL/uL (ref 4.22–5.81)
RDW: 13.5 % (ref 11.5–15.5)
WBC: 10.3 10*3/uL (ref 4.0–10.5)
nRBC: 0 % (ref 0.0–0.2)

## 2019-12-28 LAB — BASIC METABOLIC PANEL
Anion gap: 8 (ref 5–15)
BUN: 21 mg/dL (ref 8–23)
CO2: 32 mmol/L (ref 22–32)
Calcium: 8.4 mg/dL — ABNORMAL LOW (ref 8.9–10.3)
Chloride: 104 mmol/L (ref 98–111)
Creatinine, Ser: 1.12 mg/dL (ref 0.61–1.24)
GFR calc Af Amer: >60 mL/min (ref >60)
GFR calc non Af Amer: >60 mL/min (ref >60)
Glucose, Bld: 87 mg/dL (ref 70–99)
Potassium: 3.1 mmol/L — ABNORMAL LOW (ref 3.5–5.1)
Sodium: 144 mmol/L (ref 135–145)

## 2019-12-28 LAB — TROPONIN I (HIGH SENSITIVITY): Troponin I (High Sensitivity): 5 ng/L (ref <18)

## 2019-12-28 NOTE — ED Notes (Signed)
Pt. Alert and oriented to self , warm and dry, in no distress. Pt. Denies SI, HI, and AVH. Pt. Encouraged to let nursing staff know of any concerns or needs.  

## 2019-12-28 NOTE — ED Notes (Signed)
Voluntary, pending placement

## 2019-12-28 NOTE — ED Notes (Signed)
Patient screaming in room. This Clinical research associate redirected multiple times along with Techs about screaming. Explained to patient that it is 0430 and people are sleeping. Patient continues to scream out.

## 2019-12-28 NOTE — ED Notes (Signed)

## 2019-12-28 NOTE — ED Notes (Signed)
Pt observed to have O2 sats in the 88-90% on RA. When placed on 3L of oxygen, pt recovers to 96-97%. Pt will remove the nasal cannula and needs reminding to keep it on. EDP Dr Derrill Kay notified of this.

## 2019-12-28 NOTE — ED Notes (Signed)
Pt requires help with ADL's and had to get meds crushed in apple sauce. Pt refuses to take the pills otherwise. Pt able to feed himself well. Pt requires constant redirection to let staff help him change his diaper when he is incontinent. Pt verbally aggressive with staff and has to be reminded not to hit staff.

## 2019-12-28 NOTE — ED Provider Notes (Signed)
Emergency Medicine Observation Re-evaluation Note  Eric [name] is a 74 y.o. male, seen on rounds today.  Pt initially presented to the ED for complaints of Aggressive Behavior and Foot Pain Currently, the patient is asleep and resting comfortably after requiring medications for sedation due to some agitation.  Physical Exam  BP 101/82 (BP Location: Right Arm)   Pulse (!) 53   Temp 98.1 F (36.7 C) (Oral)   Resp 17   Ht 6' (1.829 m)   Wt 131.5 kg   SpO2 100%   BMI 39.33 kg/m  Physical Exam   Constitutional: Resting comfortably. Eyes: Conjunctivae are normal. Head: Atraumatic. Nose: No congestion/rhinnorhea. Mouth/Throat: Mucous membranes are moist Cardiovascular: No cyanosis noted. Respiratory: Normal respiratory effort. Gastrointestinal: Non-distended. Genitourinary: deferred Musculoskeletal: No lower extremity tenderness nor edema. Neurologic: asleep  Skin: Skin is warm, dry and intact. No rash noted.  ED Course / MDM  EKG:EKG Interpretation  Date/Time:  Wednesday Dec 05 2019 13:39:00 EDT Ventricular Rate:  67 PR Interval:  210 QRS Duration: 70 QT Interval:  398 QTC Calculation: 420 R Axis:   6 Text Interpretation: Sinus rhythm with marked sinus arrhythmia with 1st degree A-V block Otherwise normal ECG Confirmed by UNCONFIRMED, DOCTOR ([id number]), editor Fredric Mare, Tammy [phone]) on 12/06/2019 9:19:17 AM    I have reviewed the labs performed to date as well as medications administered while in observation.  Recent changes in the last 24 hours include patient had to be given with some of his as needed Zyprexa and benadryl secondary to a little bit of agitation.  Upon my reevaluation patient was asleep secondary to medications but was resting comfortably did not seem to be any distress.   Plan  Current plan is for placement  Patient is not under full IVC at this time.   Concha Se, MD 12/28/19 [phone]

## 2019-12-29 DIAGNOSIS — F0391 Unspecified dementia with behavioral disturbance: Secondary | ICD-10-CM | POA: Diagnosis not present

## 2019-12-29 MED ORDER — SODIUM CHLORIDE 0.9 % IV SOLN: Freq: Once | INTRAVENOUS | Status: AC

## 2019-12-29 MED ORDER — POTASSIUM CHLORIDE CRYS ER 20 MEQ PO TBCR: 40.0000 meq | EXTENDED_RELEASE_TABLET | Freq: Once | ORAL | Status: DC

## 2019-12-29 MED ORDER — POTASSIUM CHLORIDE 20 MEQ PO PACK
40.0000 meq | PACK | Freq: Once | ORAL | Status: AC
  Administered 2019-12-29: 40 meq via ORAL
  Filled 2019-12-29: qty 2

## 2019-12-29 MED ORDER — SODIUM CHLORIDE 0.9 % IV BOLUS
1000.0000 mL | Freq: Once | INTRAVENOUS | Status: AC
  Administered 2019-12-29: 1000 mL via INTRAVENOUS

## 2019-12-29 NOTE — ED Notes (Signed)
Hourly rounding reveals patient in room. No complaints, stable, in no acute distress. Q15 minute rounds and monitoring via Security Cameras to continue. 

## 2019-12-29 NOTE — ED Provider Notes (Signed)
-----------------------------------------   11:18 AM on 12/29/2019 -----------------------------------------  I was asked to reevaluate the patient due to concern for increased weakness and declining mental status.  Apparently, the patient previously had been more mobile, easily transferring from bed to chair, and intermittently agitated.  On my reassessment, his vital signs remained stable except he is borderline bradycardic.  Heart rate is currently in the 50s, but has dipped down to the low 40s.  EKG shows sinus rhythm.  ED ECG REPORT I, Dionne Bucy, the attending physician, personally viewed and interpreted this ECG.  Date: 12/29/2019 EKG Time: 1101 Rate: 54 Rhythm: normal sinus rhythm QRS Axis: normal Intervals: normal ST/T Wave abnormalities: normal Narrative Interpretation: no evidence of acute ischemia   I reviewed recent labs.  The patient had blood work done yesterday evening at 2249.  The lab work-up is unremarkable for acute findings.  His troponin is negative.  On exam currently, the patient appears tired and somewhat somnolent but is arousable to voice.  He is oriented x4.  He was able to answer my questions and follow commands appropriately.  He is moving all extremities equally.  There is no facial droop.  He does have dry mucous membranes.  At this time, the patient's overall status is consistent with deconditioning related to being in the ED without much physical activity for the last few weeks.  He appears somewhat dehydrated.  There is no evidence of acute stroke and he does not appear to have had any acute decline in mental status.  I have ordered maintenance fluids, and we will continue to monitor the patient closely.  I feel that repeat PT evaluation would be beneficial.      Dionne Bucy, MD 12/29/19 1121

## 2019-12-29 NOTE — ED Notes (Signed)
Pt refused his medications " I don't want that shit"

## 2019-12-29 NOTE — ED Notes (Signed)
Pts diaper was checked and found to be dry.

## 2019-12-29 NOTE — ED Provider Notes (Signed)
Per patient's day RN, the patient is less aggressive/lively than normal and has not urinated yet today. Currently receiving some IV fluids at maintenance.  We will give a IVF bolus, bladder scan (~50-60 cc, not retaining), obtain UA.  Also noticed that patient has recently been started on several sedating type medications, including QHS Zyprexa, Haldol, benzodiazepines.  Discussed this at signout in with patient's PM RN and PM ED MD.  Will plan for AM provider to touch base w/ psychiatry for medication review.    Miguel Aschoff., MD 12/30/19 [phone]

## 2019-12-29 NOTE — ED Notes (Signed)
Pt cannot be aroused long enough to be given medications.

## 2019-12-29 NOTE — ED Notes (Signed)
Report to include Situation, Background, Assessment, and Recommendations received from Amy RN. Patient alert and oriented, warm and dry, in no acute distress. Patient denies SI, HI, AVH and pain. Patient made aware of Q15 minute rounds and security cameras for their safety. Patient instructed to come to me with needs or concerns.  

## 2019-12-29 NOTE — ED Notes (Signed)
Pt eating his lunch tray. Given another blanket as requested.

## 2019-12-29 NOTE — ED Notes (Signed)
EDP made aware of pt's current condition.

## 2019-12-29 NOTE — ED Notes (Signed)
Pt given bed bath and changed at this time. Pt floors were mopped by EVS and pt was given a meal tray. Pt did not eat tray even with encouragement from EDT. Notified nurse. Also notified nurse that this pt is looking and acting not the same as last time this EDT interacted with pt.

## 2019-12-29 NOTE — ED Notes (Signed)
Pt is yelling "Mama!"  When RN entered the room pt asked if his mama could bring him a blanket.

## 2019-12-29 NOTE — ED Notes (Signed)
RN unable to complete psych assessment.

## 2019-12-29 NOTE — ED Notes (Signed)
Bladder scan completed: 50-60 mls EDP made aware

## 2019-12-29 NOTE — ED Notes (Signed)
Pt's brief was checked and found to be dry. 

## 2019-12-29 NOTE — ED Provider Notes (Signed)
Emergency Medicine Observation Re-evaluation Note  Antonios Ostrow III is a 74 y.o. male, seen on rounds today.  Pt initially presented to the ED for complaints of Aggressive Behavior and Foot Pain Currently, the patient is sleeping comfortably.  Physical Exam  BP 116/71 (BP Location: Right Arm)   Pulse 68   Temp (!) 97.5 F (36.4 C) (Axillary)   Resp 18   Ht 6' (1.829 m)   Wt 131.5 kg   SpO2 94%   BMI 39.33 kg/m    Constitutional: Resting comfortably. Eyes: Conjunctivae are normal. Head: Atraumatic. Nose: No congestion/rhinnorhea. Mouth/Throat: Mucous membranes are moist Cardiovascular: No cyanosis noted. Respiratory: Normal respiratory effort. Gastrointestinal: Non-distended. Musculoskeletal: Full range of motion. Neurologic: Moving all extremities. Skin: Skin is warm, dry and intact. No rash noted.  ED Course / MDM   I have reviewed the labs performed to date as well as medications administered while in observation.  The patient had an episode of desaturation yesterday evening to the high 80s when he took off his oxygen, however when reminded to keep it on his oxygen and respiratory status are normal.  He has had no other acute events overnight.  Plan  Current plan is for [facility] psych placement. Patient is not under full IVC at this time.   Dionne Bucy, MD 12/29/19 [phone]

## 2019-12-29 NOTE — ED Notes (Signed)
Pt's brief was checked and found to be dry.

## 2019-12-30 DIAGNOSIS — F0391 Unspecified dementia with behavioral disturbance: Secondary | ICD-10-CM | POA: Diagnosis not present

## 2019-12-30 MED ORDER — POTASSIUM CHLORIDE 20 MEQ PO PACK
40.0000 meq | PACK | Freq: Once | ORAL | Status: AC
  Administered 2019-12-30: 40 meq via ORAL
  Filled 2019-12-30: qty 2

## 2019-12-30 NOTE — ED Notes (Signed)
Hourly rounding reveals patient in room. No complaints, stable, in no acute distress. Q15 minute rounds and monitoring via Security Cameras to continue. 

## 2019-12-30 NOTE — ED Notes (Signed)
Pt cleansed from urine incont. Peri care performed. Pt turned onto right side. No other needs voiced at this time. Will continue to monitor Q15 minute rounds.

## 2019-12-30 NOTE — ED Notes (Signed)
HR was below 60, Doctor Verones notified, no new orders was given she said to continue to monitor BP and notify her when BP is low normal limit

## 2019-12-30 NOTE — ED Notes (Addendum)
Assumed car of patient. Patient aox4, denies pain or concerns. Vss. Patient on cardiac monitor showing sinus brady in the soft 40's.Pt asymptomatic. As per md hold psych meds until further med adjustments. Otherwise stable. awaiting placement. Safety maintained. Will continue to monitor.

## 2019-12-30 NOTE — ED Notes (Addendum)
This tech performed pericare and dry brief placed.  Pt was given apple sauce.

## 2019-12-30 NOTE — ED Notes (Signed)
Patient c/o of pain , points to left side of upper abd, Md notified of patient grunting and stating pain. Unable to give a scale number. Md to eval. PRN tylenol given will monitor. Vss.

## 2019-12-30 NOTE — ED Notes (Signed)
Patient sitting up comfortably in bed eating breakfast. Denies any pain or concerns at present time.

## 2019-12-30 NOTE — ED Notes (Signed)
Hourly rounding reveals patient in room. No complaints, stable, in no acute distress. Q15 minute rounds and monitoring via Rover and Officer to continue.   

## 2019-12-30 NOTE — ED Notes (Signed)
Pt brief wet changed into new dry brief.

## 2019-12-30 NOTE — ED Notes (Signed)
Patient repositioned up in bed. Diner tray provided.

## 2019-12-30 NOTE — ED Notes (Signed)
,  Hourly rounding reveals patient in room. No complaints, stable, in no acute distress. Q15 minute rounds and monitoring via [employer] to continue.   

## 2019-12-30 NOTE — ED Provider Notes (Addendum)
Emergency Medicine Observation Re-evaluation Note  Eric [name] is a 74 y.o. male, seen on rounds today.  Pt initially presented to the ED for complaints of Aggressive Behavior and Foot Pain Currently, the patient is sleeping. No distress overnight. Patient noticed to be bradycardic through the night. Asymptomatic with stable BP  Physical Exam  BP 127/60   Pulse 64   Temp (!) 97.5 F (36.4 C) (Axillary)   Resp 16   Ht 6' (1.829 m)   Wt 131.5 kg   SpO2 91%   BMI 39.33 kg/m  Physical Exam   General: No apparent distress HEENT: moist mucous membranes CV: RRR Pulm: Normal WOB GI: soft and non tender MSK: no edema or cyanosis Neuro: face symmetric, moving all extremities  ED ECG REPORT I, Nita Sickle, the attending physician, personally viewed and interpreted this ECG.  Sinus bradycardia first-degree AV block, rate of 44, normal QTC, normal axis, no ST elevations or depressions.  Unchanged from prior.  ED Course / MDM    I have reviewed the labs performed to date as well as medications administered while in observation.  Patient noticed by staff to be less aggressive and sleeping more than normal. Patient with several recent changes on his psych medications since patient had a history of agitation requiring IM medications at bedtime for several weeks.  Will discuss with psychiatry in the morning so his meds can be adjusted.  Also patient noticed to be bradycardic with pulse going down to be upper 30s while sleeping.  Normal BP.  Repeat EKG unchanged from baseline showing sinus bradycardia.  Most recent chemistry panel from a day ago showing hypokalemia with a K of 3.1.  Will supplement p.o.  We will continue to monitor closely. Plan  Current plan is for psych dispo. Patient is not under full IVC at this time.   Don Perking, [place], MD 12/30/19 [phone]    Nita Sickle, MD 12/30/19 [phone]

## 2019-12-30 NOTE — ED Notes (Signed)
Hourly rounding reveals patient in room. No complaints, stable, in no acute distress. Q15 minute rounds and monitoring via [employer] to continue. Pt's HR is dropped to 37 bpm, pt is asytopomatic MD notified, no new order given, asked for patient to be monitored.

## 2019-12-30 NOTE — ED Notes (Signed)
Patient assisted with repositioning. Peri care and change of depend. Sitting up in bed watching tv. Vss.

## 2019-12-30 NOTE — ED Notes (Signed)
Pt given ice water.

## 2019-12-31 ENCOUNTER — Emergency Department: Payer: Medicare Other

## 2019-12-31 DIAGNOSIS — F0391 Unspecified dementia with behavioral disturbance: Secondary | ICD-10-CM | POA: Diagnosis not present

## 2019-12-31 MED ORDER — FUROSEMIDE 10 MG/ML IJ SOLN
40.0000 mg | Freq: Once | INTRAMUSCULAR | Status: AC
  Administered 2019-12-31: 40 mg via INTRAVENOUS
  Filled 2019-12-31: qty 4

## 2019-12-31 NOTE — ED Notes (Signed)
Pt asleep, breakfast tray placed on bedside table in rm.  

## 2019-12-31 NOTE — ED Notes (Signed)
Pt's brief was checked and found to be dry. 

## 2019-12-31 NOTE — ED Notes (Signed)

## 2019-12-31 NOTE — ED Notes (Signed)
Patient ate 100% of lunch and had apple juice, He is calm, Patient breathing is less labored, He ask nurse what we were doing? Nurse explained to him that he was in the hospital, and that staff was taking care of His needs, also referrals were sent out for him to be admitted in Hospital to meet His needs, He said " I don't need any of this" Nurse listened showed empathy.

## 2019-12-31 NOTE — ED Notes (Signed)
Patient's with PT at bedside, Nurse tech changed Patient's brief, Patient was saturated with urine, will continue to monitor.

## 2019-12-31 NOTE — Progress Notes (Signed)
Physical Therapy Treatment Patient Details Name: Eric [name] MRN: [id number] DOB: [birth year] Today's Date: 12/31/2019    History of Present Illness 74 year old male who comes in from facility due to aggressive behavior and foot pain.  He was hospitalized 1 month ago with right foot ulcer/infection with cellulitis. CT without definite osteomyelitis but per orthopedics evaluation location and symptoms consistent with osteomyelitis calcaneus, was needing PRAFO boot to offload ulcerative area, TDWB RLE during previous admit, no WBing orders at this time, boot not present in ED room. PMH: paroxysmal A. fib, hypertension, hypothyroidism, dementia, chronic combined systolic and diastolic congestive heart failure, hypertension    PT Comments    Pt in bed.  Participated in exercises as described below.  During ex, pt noted to be inc large amt of urine or spilled drink in bed.  RN in to assist with care.  To EOB with mod a x 1.  He is able to stand x 2 with RW and min a x 2 for linen change and care.  He is able to step along bed for proper positioning before returning to supine.    Pt anxious through our session but no aggressive behaviors noted.  He stated he does not feel well but unable to verbalize what is wrong.  Some tremors that seem to increase as anxiety increases.  Pt voices fear that I will hurt him "Don't hurt me" and fear of being alone.  Encouragement given.   Follow Up Recommendations  SNF     Equipment Recommendations  None recommended by PT    Recommendations for Other Services       Precautions / Restrictions Precautions Precautions: Fall Precaution Comments: history of dementia Other Brace: PRAFO R foot Restrictions Weight Bearing Restrictions: Yes RLE Weight Bearing: Touchdown weight bearing Other Position/Activity Restrictions: with PRAFO boot at all times    Mobility  Bed Mobility Overal bed mobility: Needs Assistance Bed Mobility: Supine to Sit;Sit to Supine      Supine to sit: Mod assist Sit to supine: Mod assist      Transfers Overall transfer level: Needs assistance Equipment used: Rolling walker (2 wheeled) Transfers: Sit to/from Stand Sit to Stand: Min assist;From elevated surface;+2 safety/equipment         General transfer comment: stood x 2 from bed for linen change and to take 4 steps along [facility] for imroved positioning.  Ambulation/Gait Ambulation/Gait assistance: Min assist;+2 safety/equipment Gait Distance (Feet): 5 Feet Assistive device: Rolling walker (2 wheeled)   Gait velocity: decreased       Stairs             Wheelchair Mobility    Modified Rankin (Stroke Patients Only)       Balance Overall balance assessment: Needs assistance   Sitting balance-Leahy Scale: Fair     Standing balance support: Bilateral upper extremity supported Standing balance-Leahy Scale: Poor Standing balance comment: pt is high fall risk. Relys heavily on RW for support and balance                            Cognition Arousal/Alertness: Awake/alert Behavior During Therapy: Anxious Overall Cognitive Status: Difficult to assess                                 General Comments: h/o dementia, here with agressive behavior, relatively appropriate today, anxious being left in room alone.  "  Don't hurt me"  encouragement given      Exercises Other Exercises Other Exercises: BLE AAROM x 10 for supine ranges    General Comments        Pertinent Vitals/Pain Pain Assessment: No/denies pain    Home Living                      Prior Function            PT Goals (current goals can now be found in the care plan section) Progress towards PT goals: Progressing toward goals    Frequency    Min 2X/week      PT Plan Current plan remains appropriate    Co-evaluation              AM-PAC PT "6 Clicks" Mobility   Outcome Measure  Help needed turning from your back to your  side while in a flat bed without using bedrails?: A Little Help needed moving from lying on your back to sitting on the side of a flat bed without using bedrails?: A Little   Help needed standing up from a chair using your arms (e.g., wheelchair or bedside chair)?: A Little Help needed to walk in hospital room?: A Lot Help needed climbing 3-5 steps with a railing? : Total 6 Click Score: 12    End of Session Equipment Utilized During Treatment: Oxygen;Gait belt Activity Tolerance: Patient tolerated treatment well;Other (comment) (anxiety) Patient left: in bed;with call bell/phone within reach;with bed alarm set Nurse Communication: Mobility status       Time: 6553-7482 PT Time Calculation (min) (ACUTE ONLY): 24 min  Charges:  $Gait Training: 8-22 mins $Therapeutic Exercise: 8-22 mins                    Chesley Noon, PTA 12/31/19, 2:38 PM

## 2019-12-31 NOTE — ED Notes (Signed)
Pt. Alert and oriented to self , warm and dry, in no distress. Pt. Denies SI, HI, and AVH. Pt. Encouraged to let nursing staff know of any concerns or needs.  

## 2019-12-31 NOTE — ED Provider Notes (Signed)
-----------------------------------------   2:36 PM on 12/31/2019 -----------------------------------------  During my shift, the RN alerted me that the patient appeared somewhat more short of breath than previously.  On my assessment, his lungs were clear bilaterally.  He did appear to have slightly increased work of breathing compared to when I had last seen him during my most recent shift as the covering ED provider several days ago however he denied any acute shortness of breath.  I obtained a repeat x-ray.  There is a right basilar opacity, possible atelectasis versus pneumonia.  The clinical presentation favors atelectasis given that the patient is afebrile, has no cough, and had no leukocytosis or other acute abnormalities on labs from a few days ago.  At this time, there is no indication for acute intervention.  He is oxygenating well.  I will pass this information on to the oncoming physician.   Dionne Bucy, MD 12/31/19 1437

## 2019-12-31 NOTE — ED Notes (Signed)
Patient complained of headache, nurse to administer tylenol po,. Patient is confused, does not know where He is, nurse explained to him that He was in the hospital, nurse had to reassure him that He would not be left alone, let him know that staff would be monitoring him. Patient is easily redirected, needs reminders.

## 2019-12-31 NOTE — ED Notes (Signed)
Patient states that his head feels better, no signs of distress, he is eating supper and drinking water, will continue to monitor.

## 2019-12-31 NOTE — ED Notes (Signed)
Pt incontinent of urine. Pt was cleaned and brief was changed.

## 2019-12-31 NOTE — TOC Progression Note (Signed)
Transition of Care [facility]) - Progression Note    Patient Details  Name: Echo Allsbrook III MRN: [id number] Date of Birth: Jan 24, 1946  [facility] CM/SW Contact  Marina Goodell Phone Number: [phone] 12/31/2019, 10:25 AM  Clinical Narrative:     CSW spoke with Tammy at [facility] [phone], and she stated she will speak with the RN and the admissions coordinator and let me know if they will be able to offer the patient placement and call me back.  Expected Discharge Plan: Skilled Nursing Facility Barriers to Discharge: ED SNF auth  Expected Discharge Plan and Services Expected Discharge Plan: Skilled Nursing Facility In-house Referral: Clinical Social Work   Post Acute Care Choice: Skilled Nursing Facility Living arrangements for the past 2 months: Skilled Nursing Facility                                       Social Determinants of Health (SDOH) Interventions    Readmission Risk Interventions Readmission Risk Prevention Plan 11/03/2018 10/31/2018  Transportation Screening Complete Complete  PCP or Specialist Appt within 5-7 Days Complete -  Home Care Screening Complete Complete  Medication Review (RN CM) Complete -  Some recent data might be hidden

## 2019-12-31 NOTE — ED Notes (Signed)
Patient with shortness of breath noted, grunts with breaths, Nurse did let Doctor know, also v/s obtained, 97.9, 88, 22, 144/ 90, 02 sat. 92% on 3 liters, Dr. Lenna Gilford chest x-ray and iv lasix, Patient refused all of His po medications. Nurse will continue to monitor.

## 2019-12-31 NOTE — ED Provider Notes (Signed)
Emergency Medicine Observation Re-evaluation Note  Eric [name] is a 74 y.o. male, seen on rounds today.  Pt initially presented to the ED for complaints of Aggressive Behavior and Foot Pain Currently, the patient is stable with no complaints.  Physical Exam  BP 125/75   Pulse 70   Temp (!) 97.5 F (36.4 C) (Axillary)   Resp (!) 23   Ht 6' (1.829 m)   Wt 131.5 kg   SpO2 91%   BMI 39.33 kg/m  Physical Exam   General: No apparent distress HEENT: moist mucous membranes CV: RRR Pulm: Normal WOB GI: soft and non tender MSK: no edema or cyanosis Neuro: face symmetric, moving all extremities    ED Course / MDM    Patient has been on no sedating medication for the last 72 hours.  Resolved bradycardia after receiving potassium yesterday.  Remains stable with no further complaints.  No recent labs or other medication changes in the last 24 hours.   Plan  Current plan is for psychiatric disposition. Patient is not under full IVC at this time.   Don Perking, [place], MD 12/31/19 [phone]

## 2020-01-01 DIAGNOSIS — F0391 Unspecified dementia with behavioral disturbance: Secondary | ICD-10-CM | POA: Diagnosis not present

## 2020-01-01 NOTE — ED Notes (Signed)
Bed bath provided. Skin care given, stage 1 noted to right buttock area. Pressure pad noted to sacral area. Patient able to assist by turning side to side in bed.

## 2020-01-01 NOTE — ED Notes (Signed)
Pt given a chocolate ice cream and a vanilla ice cream. Pt has no further needs at this time.

## 2020-01-01 NOTE — ED Notes (Signed)
Assumed care of patient patient awakens to name, denies any pain or concerns. vss this morning. Patient to have PT today. Denies SI/HV/HI. Patient awaiting placement. Safety maintained. Will continue to monitor.

## 2020-01-01 NOTE — ED Notes (Signed)
VOLUNTARY, continues to await placement 

## 2020-01-01 NOTE — ED Provider Notes (Signed)
Emergency Medicine Observation Re-evaluation Note  Eric [name] is a 74 y.o. male, seen on rounds today.  Pt initially presented to the ED almost one month ago for aggression and not taking his medication.  Physical Exam   Vitals:   12/30/19 2030 12/31/19 1500  BP: 125/75 120/82  Pulse:  80  Resp:  (!) 22  Temp:  97.6 F (36.4 C)  SpO2:  92%     General: Resting comfortably. HEENT: moist mucous membranes CV: RRR, no murmurs rubs or gallups appreciated Pulm: Normal WOB GI: soft and non tender MSK: no edema or cyanosis Neuro: face symmetric, moving all extremities    ED Course / MDM  Yesterday patient was evaluated for increased work of breathing. Had repeat CXR which showed atelectasis vs pneumonia. ER physician at that time felt pneumonia less likely given lack of fever, cough or recent leukocytosis.  Plan  Current plan is for skilled nursing facility. Patient is not under full IVC at this time.    Phineas Semen, MD 01/01/20 [phone]

## 2020-01-01 NOTE — ED Notes (Addendum)
Dinner tray given and a drink 

## 2020-01-01 NOTE — ED Notes (Signed)
Patient dried at this time 

## 2020-01-02 DIAGNOSIS — F0391 Unspecified dementia with behavioral disturbance: Secondary | ICD-10-CM | POA: Diagnosis not present

## 2020-01-02 NOTE — ED Notes (Signed)
Pt continues to scream at staff "help" this tech in to pt every time and advising pt that he needs to stop yelling out unless he truly needs something, pt states to this tech that he don't need anything but "something may come up"

## 2020-01-02 NOTE — ED Notes (Signed)
Pt brief checked by this tech, pt clothing and linens remain dry

## 2020-01-02 NOTE — ED Notes (Signed)
Pt found by this tech to have bedding wet and crying out "help" this tech in to change pt bed linen and pt gown

## 2020-01-02 NOTE — ED Provider Notes (Signed)
Emergency Medicine Observation Re-evaluation Note  Eric [name] is a 74 y.o. male, seen on rounds today.  Pt initially presented to the ED for complaints of Aggressive Behavior and Foot Pain Patient has been boarding in the ER for almost a month now.  Has been awaiting placement.  Currently, the patient is awake and alert, asking to use the restroom.  Physical Exam  BP 120/68 (BP Location: Right Arm)   Pulse 72   Temp 98.3 F (36.8 C) (Oral)   Resp 19   Ht 6' (1.829 m)   Wt 131.5 kg   SpO2 98%   BMI 39.33 kg/m  Physical Exam   General: Awake and alert, asking to use the restroom Head: [place]/AT Respiratory: Normal work of breathing Cardiac: Extremities well perfused MSK: No deformities Psych: Calm and cooperative   ED Course / MDM  No new labs in the last 24 hours.  Was COVID positive on 5/28 screening.  Demonstrates no respiratory distress today.   Plan  Current plan is for skilled nursing facility, awaiting placement. Patient is not under full IVC at this time.   Miguel Aschoff., MD 01/02/20 1030

## 2020-01-02 NOTE — ED Notes (Addendum)
Pt observed to be attempting to crawl out of bed, this place in pt room to redirect him and bed alarm is now set. Pt provided with 2 warm blankets and advised that he did not need to yell "help" every few minutes unless he needed help, pt continues to yell "help" then when asked what he needs, pt states "nothing I don't know what I want"

## 2020-01-02 NOTE — ED Notes (Signed)
Pt. Alert and oriented to self, warm and dry, in no distress. Pt. Denies SI, HI, and AVH. Pt. Encouraged to let nursing staff know of any concerns or needs. ° °ENVIRONMENTAL ASSESSMENT °Potentially harmful objects out of patient reach: Yes.   °Personal belongings secured: Yes.   °Patient dressed in hospital provided attire only: Yes.   °Plastic bags out of patient reach: Yes.   °Patient care equipment (cords, cables, call bells, lines, and drains) shortened, removed, or accounted for: Yes.   °Equipment and supplies removed from bottom of stretcher: Yes.   °Potentially toxic materials out of patient reach: Yes.   °Sharps container removed or out of patient reach: Yes.   ° °

## 2020-01-02 NOTE — ED Notes (Signed)
Pt keeps calling out "help" loudly and repeatedly, but can't tell staff what he needs help with. He has been changed, sat on bedside commode and given something to eat. Pt just keeps yelling for staff loudly. Keeps removing his nasal cannula requiring redirection to keep it on. Nasal cannula placed back on and pt reminded to keep it on.

## 2020-01-03 DIAGNOSIS — F0391 Unspecified dementia with behavioral disturbance: Secondary | ICD-10-CM | POA: Diagnosis not present

## 2020-01-03 NOTE — ED Notes (Signed)
Checked patient to see if dry patient not soiled. Gave patient wet rag to wash face.AS

## 2020-01-03 NOTE — ED Notes (Signed)
Changed pt clothing, bedding, and depend. Washed pt up and reposition in bed.

## 2020-01-03 NOTE — ED Notes (Signed)
Pt told this RN he was having trouble chewing his lunch tray. Diet order changed to soft foods.

## 2020-01-03 NOTE — ED Notes (Signed)
RN attempted to give pt medications in ice cream. Pt would not take any.

## 2020-01-03 NOTE — ED Provider Notes (Signed)
Emergency Medicine Observation Re-evaluation Note  Eric [name] is a 74 y.o. male, seen on rounds today.  Pt initially presented to the ED for complaints of Aggressive Behavior and Foot Pain   Physical Exam  BP 118/66 (BP Location: Right Arm)    Pulse 68    Temp 98.3 F (36.8 C) (Oral)    Resp 20    Ht 6' (1.829 m)    Wt 131.5 kg    SpO2 100%    BMI 39.33 kg/m   I have personally seen and evaluated the patient.  He was initially sleeping comfortably but awakens easily to voice.  Patient has no concerns at this time besides asking if he can have his IV removed from his right hand.  Patient states he was able to eat dinner tonight.  General: Sleeping comfortably awakens easily to voice.  Calm and cooperative, no distress. Cardiovascular: Regular rate and rhythm. Respiratory: Breath sounds equal bilaterally.   Neurologic: Clear speech, moves extremities.  ED Course / MDM   I have personally reviewed the patient's lab work.  Largely nonrevealing, besides a positive Covid test 12/14/2019.  Patient is now past his quarantine duration.  Plan  Continue to look for a nursing facility able to adequately care for the patient.  No exact disposition planned at this time. Patient is under full IVC at this time.   Minna Antis, MD 01/03/20 0021

## 2020-01-03 NOTE — ED Notes (Signed)
Pt given dinner tray. Pt ate 100% of tray.

## 2020-01-03 NOTE — ED Notes (Signed)
Changed patient and repositioned in the bed with assistance from [facility].AS

## 2020-01-03 NOTE — Progress Notes (Signed)
PT Cancellation Note  Patient Details Name: Eric [name] MRN: [id number] DOB: 1945-11-24   Cancelled Treatment:    Reason Eval/Treat Not Completed: Other (comment)   Pt in bed, eating lunch.  Waited 15 minutes on unit for him to finish with minimal progress towards finishing.  Encouraged participation but he again stated he was eating "Not now."  Encouraged him to sit EOB to finish but he continues to decline.  Will attempt again later as time allows.   Danielle Dess 01/03/2020, 2:04 PM

## 2020-01-03 NOTE — ED Notes (Signed)
Gave patient [place] tray and water to drink.AS

## 2020-01-03 NOTE — ED Notes (Signed)
Pt refused medications in applesauce.  "Dont start this shit with me again."

## 2020-01-04 DIAGNOSIS — F0391 Unspecified dementia with behavioral disturbance: Secondary | ICD-10-CM | POA: Diagnosis not present

## 2020-01-04 MED ORDER — BISACODYL 10 MG RE SUPP
10.0000 mg | Freq: Once | RECTAL | Status: AC
  Administered 2020-01-04: 10 mg via RECTAL
  Filled 2020-01-04: qty 1

## 2020-01-04 NOTE — ED Notes (Signed)
Assisted ED Tech Felicia to perform peri-care and change briefs and chux. Barrier cream placed on gluteal fold and patient's feet were placed in boots for protection. Beginning of bilateral heel breakdown noted.

## 2020-01-04 NOTE — ED Notes (Signed)
Pt given breakfast meal tray. 

## 2020-01-04 NOTE — ED Notes (Signed)
Pt repeatedly calling out due to constipation. This RN repeatedly explained that MD made aware. Pt states "I can't shit, I can't shit". VS obtained by this RN. Pt tolerated well. This Rn offered patient AM medications. Pt states "I'm not taking that shit".

## 2020-01-04 NOTE — ED Notes (Signed)
Pt refuses bedpan after repeatedly calling for help to use the restroom.

## 2020-01-04 NOTE — ED Notes (Signed)
This RN to bedside due to patient repeatedly calligng out from bed, pt states "honey I'm hurting", when patient asked where he was hurting pt states "my ass". This RN explained would review chart and see if patient had medications for pain.

## 2020-01-04 NOTE — ED Notes (Signed)
Pt placed on bedpan by Jeb, EDT. Pt continues to call out repeated screaming "help me, help me", upon staff arrival to bedside pt either repeatedly states, "I can't shit" or yells "help me" while staff at bedside.

## 2020-01-04 NOTE — ED Notes (Signed)
Pt's brief was changed. 

## 2020-01-04 NOTE — ED Notes (Signed)
Pt offered bed bath by this tech. Pt refused bed bath.

## 2020-01-04 NOTE — ED Notes (Signed)
Rectal exam performed by EDP, per EDP some stool noted, suppository administered by EDP at this time. Pt tolerated well.

## 2020-01-04 NOTE — ED Notes (Signed)
Pt repeatedly calling out for help, this RN and Jeb, EDT to bedside, pt states that he has had  BM, refuses to allow staff to check him, then states he has not had a BM. Pt refused to allow staff to check his brief to see if he has had a BM.

## 2020-01-04 NOTE — ED Notes (Signed)
Report given to Noel RN 

## 2020-01-04 NOTE — ED Notes (Signed)
Pt placed on bedpan due to patient c/o feeling like he needs to have a BM, pt unable to have a BM at this time.

## 2020-01-04 NOTE — Progress Notes (Signed)
Physical Therapy Treatment Patient Details Name: Eric [name] MRN: [id number] DOB: 1945/09/30 Today's Date: 01/04/2020    History of Present Illness 74 year old male who comes in from facility due to aggressive behavior and foot pain.  He was hospitalized 1 month ago with right foot ulcer/infection with cellulitis. CT without definite osteomyelitis but per orthopedics evaluation location and symptoms consistent with osteomyelitis calcaneus, was needing PRAFO boot to offload ulcerative area, TDWB RLE during previous admit, no WBing orders at this time, boot not present in ED room. PMH: paroxysmal A. fib, hypertension, hypothyroidism, dementia, chronic combined systolic and diastolic congestive heart failure, hypertension    PT Comments    Pt was supine in bed upon arriving on 2 L o2. He is confused but did agree to OOB activity with max encouragement. Pt required mod assist to exit R side of bed with max vcs for technique, safety and sequencing. He stood to RW from EOB 2 x with min assist and was able to ambulate to doorway and back to bed with slow, flexed, step to gait pattern. He continues to be high fall risk. Limited distance 2/2 to pt's willingness/fatigue. Pt was SOB with minimal gait distances however sao2 >90 % throughout on 2L. He was unwilling to allow therapist to apply soft heel boots but did allow therapist to place pillow under legs to float heels. He will benefit from SNF at DC to address deficits and improve safe functional mobility. PT will continue to follow per current POC.     Follow Up Recommendations  SNF     Equipment Recommendations  None recommended by PT    Recommendations for Other Services       Precautions / Restrictions Precautions Precautions: Fall Precaution Comments: history of dementia Other Brace: PRAFO R foot (pt unwilling to allow therapist to apply) Restrictions Weight Bearing Restrictions: No    Mobility  Bed Mobility Overal bed mobility:  Needs Assistance Bed Mobility: Supine to Sit;Sit to Supine     Supine to sit: Mod assist Sit to supine: Mod assist   General bed mobility comments: Pt was able to exit R side of bed with incraesed time and mod assist. HOB was elevated with moderate vcs for technique and sequencing.  Transfers Overall transfer level: Needs assistance Equipment used: Rolling walker (2 wheeled)   Sit to Stand: Min assist;From elevated surface         General transfer comment: Pt was able to STS 2 x EOB with min assist + moderate vcs for improved safety and technique. Pt has poor standing posture and poor asbility to correct.  Ambulation/Gait Ambulation/Gait assistance: Min assist;+2 safety/equipment Gait Distance (Feet): 15 Feet Assistive device: Rolling walker (2 wheeled) Gait Pattern/deviations: Step-to pattern;Trunk flexed;Narrow base of support;Shuffle Gait velocity: decreased   General Gait Details: pt depends heavily on RW for support throughout all standing activity. He was able to ambulate 15 ft with flexed posture and shuffling gait kinematics. Overall tolerated well but verbilizes fatigue. pt very eager to return to bed after session/OOB activity   Stairs             Wheelchair Mobility    Modified Rankin (Stroke Patients Only)       Balance Overall balance assessment: Needs assistance Sitting-balance support: Feet supported Sitting balance-Leahy Scale: Fair Sitting balance - Comments: no LOB sitting EOB however close supervision for safety   Standing balance support: Bilateral upper extremity supported Standing balance-Leahy Scale: Poor Standing balance comment: pt is high fall  risk. Relys heavily on RW for support and balance                            Cognition Arousal/Alertness: Awake/alert Behavior During Therapy: Anxious;Flat affect Overall Cognitive Status: History of cognitive impairments - at baseline                                  General Comments: h/o dementia, here with agressive behavior, relatively appropriate today, but anxious. He required constant encopuragement to fully participate and redirecting to stay focused on task.      Exercises      General Comments        Pertinent Vitals/Pain Pain Assessment: No/denies pain Faces Pain Scale: No hurt    Home Living                      Prior Function            PT Goals (current goals can now be found in the care plan section) Acute Rehab PT Goals Patient Stated Goal: none stated Progress towards PT goals: Not progressing toward goals - comment (pt's cognition and willingness to get OOB more frequently)    Frequency    Min 2X/week      PT Plan Current plan remains appropriate    Co-evaluation              AM-PAC PT "6 Clicks" Mobility   Outcome Measure  Help needed turning from your back to your side while in a flat bed without using bedrails?: A Little Help needed moving from lying on your back to sitting on the side of a flat bed without using bedrails?: A Little Help needed moving to and from a bed to a chair (including a wheelchair)?: A Little Help needed standing up from a chair using your arms (e.g., wheelchair or bedside chair)?: A Little Help needed to walk in hospital room?: A Lot Help needed climbing 3-5 steps with a railing? : Total 6 Click Score: 15    End of Session Equipment Utilized During Treatment: Oxygen;Gait belt (2 L throughout with sao2 > 92% throughout) Activity Tolerance: Patient limited by fatigue Patient left: in bed;with call bell/phone within reach;with bed alarm set;with nursing/sitter in room Nurse Communication: Mobility status PT Visit Diagnosis: Other abnormalities of gait and mobility (R26.89);Muscle weakness (generalized) (M62.81);Difficulty in walking, not elsewhere classified (R26.2)     Time: 1443-1540 PT Time Calculation (min) (ACUTE ONLY): 14 min  Charges:  $Gait Training: 8-22  mins                    Julaine Fusi PTA 01/04/20, 2:11 PM

## 2020-01-04 NOTE — ED Provider Notes (Signed)
Emergency Medicine Observation Re-evaluation Note  Eric [name] is a 74 y.o. male, seen on rounds today.  Pt initially presented to the ED for complaints of Aggressive Behavior and Foot Pain Currently, the patient is sitting in bed, complains of constipation and feeling like he can't go to the bathroom.  Physical Exam  BP 119/77 (BP Location: Right Arm)   Pulse 74   Temp 97.9 F (36.6 C) (Oral)   Resp (!) 22   Ht 6' (1.829 m)   Wt 131.5 kg   SpO2 95%   BMI 39.33 kg/m  Physical Exam   Constitutional: Resting comfortably. Eyes: Conjunctivae are normal. Head: Atraumatic. Nose: No congestion/rhinnorhea. Mouth/Throat: Mucous membranes are moist. Neck: Normal ROM Cardiovascular: No cyanosis noted. Respiratory: Normal respiratory effort. Gastrointestinal: Non-distended. Stool in rectal vault, no fecal impaction. Genitourinary: deferred Musculoskeletal: No lower extremity tenderness nor edema. Neurologic:  Normal speech and language. No gross focal neurologic deficits are appreciated. Skin:  Skin is warm, dry and intact. No rash noted.    ED Course / MDM  EKG:EKG Interpretation  Date/Time:  Sunday December 30 2019 05:40:27 EDT Ventricular Rate:  47 PR Interval:  222 QRS Duration: 76 QT Interval:  466 QTC Calculation: 412 R Axis:   0 Text Interpretation: Sinus bradycardia with 1st degree A-V block Otherwise normal ECG Confirmed by UNCONFIRMED, DOCTOR ([id number]), editor Fredric Mare, Tammy [phone]) on 12/31/2019 1:23:53 PM    I have reviewed the labs performed to date as well as medications administered while in observation.  Recent changes in the last 24 hours include patient's new complaint of constipation. Rectal exam revealed no fecal impaction and patient treated with dulcolax suppository.  ------------------------------------------------------------------------------------------------------------------- Fecal Disimpaction Procedure Note:  Performed by me:  Patient placed in the  lateral recumbent position with knees drawn towards chest. Nurse present for patient support. Large amount of hard brown stool removed. No complications during procedure.   ------------------------------------------------------------------------------------------------------------------   Plan  Current plan is to await placement. Patient is under full IVC at this time.   Chesley Noon, MD 01/04/20 1251

## 2020-01-04 NOTE — ED Notes (Signed)
Pt repeatedly calling out "help me, somebody help me, help me!" When staff to bedside, pt repeatedly c/o constipation at this time. This RN explained awaiting EDP. Per EDP plan to administer meds and perform rectal exam at same time for patient comfort.

## 2020-01-04 NOTE — ED Notes (Signed)
Pt refused to take other PO medications at this time.

## 2020-01-05 DIAGNOSIS — F0391 Unspecified dementia with behavioral disturbance: Secondary | ICD-10-CM | POA: Diagnosis not present

## 2020-01-05 MED ORDER — MAGNESIUM HYDROXIDE 400 MG/5ML PO SUSP
15.0000 mL | Freq: Every day | ORAL | Status: DC | PRN
  Filled 2020-01-05: qty 30

## 2020-01-05 NOTE — ED Notes (Signed)
Hourly rounding reveals patient in room. No complaints, stable, in no acute distress. Q15 minute rounds and monitoring via Rover and Officer to continue.   

## 2020-01-05 NOTE — ED Notes (Signed)
Report to include Situation, Background, Assessment, and Recommendations received from RN. Patient alert and oriented, warm and dry, in no acute distress. Patient denies SI, HI, AVH and pain. Patient made aware of Q15 minute rounds and Rover and Officer presence for their safety. Patient instructed to come to me with needs or concerns.   

## 2020-01-05 NOTE — ED Notes (Signed)
Pt refused his night meds despite several prompts

## 2020-01-05 NOTE — ED Provider Notes (Signed)
Emergency Medicine Observation Re-evaluation Note  Eric [name] is a 74 y.o. male, seen on rounds today.  Pt initially presented to the ED for complaints of Aggressive Behavior and Foot Pain Currently, the patient is The patient stable and has no complaints this morning.  Feels improved from his constipation.  Physical Exam  BP 112/77 (BP Location: Right Arm)    Pulse 60    Temp 98 F (36.7 C) (Oral)    Resp 20    Ht 6' (1.829 m)    Wt 131.5 kg    SpO2 90%    BMI 39.33 kg/m  Physical Exam   General: No apparent distress HEENT: moist mucous membranes CV: RRR Pulm: Normal WOB GI: soft and non tender MSK: no edema or cyanosis Neuro: face symmetric, moving all extremities   ED Course / MDM     I have reviewed the labs performed to date as well as medications administered while in observation.  No labs or clinical changes over the last 24 hours Plan  Current plan is for psychiatric placement. Patient is under full IVC at this time.   Don Perking, [place], MD 01/05/20 [phone]

## 2020-01-05 NOTE — ED Notes (Signed)
Vol soc work placement 

## 2020-01-05 NOTE — ED Notes (Signed)
Pt's brief was checked and found to be dry. 

## 2020-01-05 NOTE — ED Notes (Signed)
Patient refused his medications orally, he said "im not taking that shit" patient then given Zyprexa IM

## 2020-01-05 NOTE — ED Notes (Signed)
Pt refuses offer of any refreshment, - water provided - pt reports "not right now please" when refusing meds and declines anything PO

## 2020-01-06 DIAGNOSIS — F0391 Unspecified dementia with behavioral disturbance: Secondary | ICD-10-CM | POA: Diagnosis not present

## 2020-01-06 NOTE — ED Notes (Signed)
Hourly rounding reveals patient in room. No complaints, stable, in no acute distress. Q15 minute rounds and monitoring via Rover and Officer to continue.   

## 2020-01-06 NOTE — ED Notes (Signed)
Meal tray given 

## 2020-01-06 NOTE — ED Notes (Signed)
Gave breakfast tray with juice. 

## 2020-01-06 NOTE — ED Notes (Signed)
The patient was helped to the bedside toilet. Myself and RN Tresa Endo. Productive bowel movement by the patient. Placed back into the bed, covered and placed bed in lowest position. No issues.

## 2020-01-06 NOTE — ED Notes (Signed)
This RN and Vernona Rieger, EDT at bedside. Pt brief and linens soiled. Pt cleaned and dry brief placed on pt. Pt lines changed. Pt denies any needs at this time

## 2020-01-06 NOTE — ED Notes (Signed)
Gave patient a bed bath, changed bedding, patient has new gown/socks, and vitals have been done.

## 2020-01-06 NOTE — ED Notes (Signed)
Pts dinner tray still at bedside.  Some potatoes were eaten.  Gave patient a [place] sandwich and pt ate entire thing.  Drink provided, and pt resting watching tv.  Pt still refuses to wear his oxygen.

## 2020-01-06 NOTE — ED Notes (Signed)
Helped pt sit on bedside commode. Pt unable to have a bm. New brief was put on pt.

## 2020-01-06 NOTE — ED Notes (Signed)
Hourly rounding reveals patient in room. No complaints, stable, in no acute distress. Q15 minute rounds and monitoring via Security Cameras to continue. 

## 2020-01-06 NOTE — ED Notes (Signed)
Pt attempting to get out of bed to sit on the commode.  Pts wet brief changed.  Pt continues to scream out to "help him".  Pt doesn't know why he keeps calling out, Pt keeps rolling to his side and bed alarm keeps going off. Pt sats were at 88% on RA after getting off the commode.  Pt allowed me to but him back on 2L of o2 and his o2 sats went to 96%.

## 2020-01-07 DIAGNOSIS — F0391 Unspecified dementia with behavioral disturbance: Secondary | ICD-10-CM | POA: Diagnosis not present

## 2020-01-07 NOTE — ED Notes (Signed)
Called [facility] for transport to [phone]

## 2020-01-07 NOTE — TOC Transition Note (Signed)
Transition of Care [facility]) - CM/SW Discharge Note   Patient Details  Name: Eric [name] MRN: [id number] Date of Birth: 06-Sep-1945  [facility]) CM/SW Contact:  Joseph Art, LCSWA Phone Number: 01/07/2020, 11:52 AM   Clinical Narrative:     CSW spoke with patient's guardian Therapist, music DSS, and she agreed patient can go to [facility] [place] with Home Health.  Patient is already active with [facility], CSW spoke with Cassie to confirm.  She will call CSW back. Patient will d/c to [facility] Room#212B, Report # [phone], include hard copies of new prescriptions with AVS. EDP/ED staff notified.  Final next level of care: Skilled Nursing Facility Barriers to Discharge: ED SNF auth   Patient Goals and CMS Choice   CMS Medicare.gov Compare Post Acute Care list provided to:: Patient Represenative (must comment) ([name] legal guardian) Choice offered to / list presented to : [facility] POA / Guardian  Discharge Placement                       Discharge Plan and Services In-house Referral: Clinical Social Work   Post Acute Care Choice: Skilled Nursing Facility                               Social Determinants of Health (SDOH) Interventions     Readmission Risk Interventions Readmission Risk Prevention Plan 11/03/2018 10/31/2018  Transportation Screening Complete Complete  PCP or Specialist Appt within 5-7 Days Complete -  Home Care Screening Complete Complete  Medication Review (RN CM) Complete -  Some recent data might be hidden

## 2020-01-07 NOTE — ED Notes (Signed)
Assumed care of patient, hx of incontinence, patient check and currently no incontinence of bowel or bladder noted. patient sleeping comfortably. Safety maintained. Will continue to monitr.

## 2020-01-07 NOTE — ED Notes (Signed)
EMS here to transport patient to the [facility] care facility.

## 2020-01-07 NOTE — ED Provider Notes (Signed)
Emergency Medicine Observation Re-evaluation Note  Dashon Mcintire III is a 74 y.o. male, seen on rounds today.  Pt initially presented to the ED for complaints of Aggressive Behavior and Foot Pain  Currently, the patient is asleep. Does not waken to verbal stimuli.   Physical Exam  BP 105/68 (BP Location: Right Arm)   Pulse 67   Temp 98.5 F (36.9 C) (Oral)   Resp 18   Ht 6' (1.829 m)   Wt 131.5 kg   SpO2 (!) 87%   BMI 39.33 kg/m  Physical Exam Physical Exam General: No apparent distress, asleep. HEENT: moist mucous membranes CV: RRR Pulm: Normal WOB GI: soft and non tender MSK: no edema or cyanosis Neuro: face symmetric, asleep     ED Course / MDM  EKG:EKG Interpretation  Date/Time:  Sunday December 30 2019 05:40:27 EDT Ventricular Rate:  47 PR Interval:  222 QRS Duration: 76 QT Interval:  466 QTC Calculation: 412 R Axis:   0 Text Interpretation: Sinus bradycardia with 1st degree A-V block Otherwise normal ECG Confirmed by UNCONFIRMED, DOCTOR ([id number]), editor Fredric Mare, Tammy [phone]) on 12/31/2019 1:23:53 PM    I have reviewed the labs performed to date as well as medications administered while in observation.  No new labs.   Pt had brief desat to 87% but in setting of taking off his 2L 02 that was listed on SNF medication list.   Plan  Current plan is for pending placement  Patient is not under full IVC at this time.   Concha Se, MD 01/07/20 [phone]

## 2020-01-07 NOTE — Discharge Instructions (Addendum)
Cleared by psych for dc home 

## 2020-01-07 NOTE — ED Notes (Addendum)
Report called and given to recieving nurse at [facility] [place]. Receiving nurse Billey Gosling Rn.

## 2020-01-07 NOTE — ED Notes (Signed)
Spoke with Rosey Bath fuller ar [facility] aware that patient is being discharged back to facility.

## 2020-01-11 ENCOUNTER — Other Ambulatory Visit: Payer: Self-pay

## 2020-01-11 ENCOUNTER — Observation Stay
Admission: EM | Admit: 2020-01-11 | Discharge: 2020-01-13 | Disposition: A | Discharge status: Skilled Nursing Facility | Payer: Medicare Other | Attending: Internal Medicine | Admitting: Internal Medicine

## 2020-01-11 ENCOUNTER — Encounter: Payer: Self-pay | Admitting: Emergency Medicine

## 2020-01-11 DIAGNOSIS — L03116 Cellulitis of left lower limb: Secondary | ICD-10-CM | POA: Insufficient documentation

## 2020-01-11 DIAGNOSIS — F0390 Unspecified dementia without behavioral disturbance: Secondary | ICD-10-CM | POA: Diagnosis not present

## 2020-01-11 DIAGNOSIS — R410 Disorientation, unspecified: Secondary | ICD-10-CM | POA: Insufficient documentation

## 2020-01-11 DIAGNOSIS — I480 Paroxysmal atrial fibrillation: Secondary | ICD-10-CM | POA: Diagnosis not present

## 2020-01-11 DIAGNOSIS — I5042 Chronic combined systolic (congestive) and diastolic (congestive) heart failure: Secondary | ICD-10-CM | POA: Diagnosis not present

## 2020-01-11 DIAGNOSIS — Z9981 Dependence on supplemental oxygen: Secondary | ICD-10-CM | POA: Diagnosis not present

## 2020-01-11 DIAGNOSIS — Z7901 Long term (current) use of anticoagulants: Secondary | ICD-10-CM | POA: Diagnosis not present

## 2020-01-11 DIAGNOSIS — Z79899 Other long term (current) drug therapy: Secondary | ICD-10-CM | POA: Insufficient documentation

## 2020-01-11 DIAGNOSIS — L03115 Cellulitis of right lower limb: Secondary | ICD-10-CM | POA: Diagnosis not present

## 2020-01-11 DIAGNOSIS — I878 Other specified disorders of veins: Secondary | ICD-10-CM | POA: Diagnosis not present

## 2020-01-11 DIAGNOSIS — G4730 Sleep apnea, unspecified: Secondary | ICD-10-CM | POA: Insufficient documentation

## 2020-01-11 DIAGNOSIS — J9611 Chronic respiratory failure with hypoxia: Secondary | ICD-10-CM | POA: Diagnosis not present

## 2020-01-11 DIAGNOSIS — Z20822 Contact with and (suspected) exposure to covid-19: Secondary | ICD-10-CM | POA: Diagnosis not present

## 2020-01-11 DIAGNOSIS — F324 Major depressive disorder, single episode, in partial remission: Secondary | ICD-10-CM | POA: Insufficient documentation

## 2020-01-11 DIAGNOSIS — F411 Generalized anxiety disorder: Secondary | ICD-10-CM | POA: Diagnosis present

## 2020-01-11 DIAGNOSIS — Z89021 Acquired absence of right finger(s): Secondary | ICD-10-CM | POA: Diagnosis not present

## 2020-01-11 DIAGNOSIS — I10 Essential (primary) hypertension: Secondary | ICD-10-CM | POA: Diagnosis present

## 2020-01-11 DIAGNOSIS — E039 Hypothyroidism, unspecified: Secondary | ICD-10-CM | POA: Diagnosis not present

## 2020-01-11 DIAGNOSIS — Z87891 Personal history of nicotine dependence: Secondary | ICD-10-CM | POA: Insufficient documentation

## 2020-01-11 DIAGNOSIS — L03119 Cellulitis of unspecified part of limb: Secondary | ICD-10-CM

## 2020-01-11 DIAGNOSIS — M7989 Other specified soft tissue disorders: Secondary | ICD-10-CM

## 2020-01-11 DIAGNOSIS — I110 Hypertensive heart disease with heart failure: Secondary | ICD-10-CM | POA: Insufficient documentation

## 2020-01-11 DIAGNOSIS — Z79890 Hormone replacement therapy (postmenopausal): Secondary | ICD-10-CM | POA: Diagnosis not present

## 2020-01-11 NOTE — ED Triage Notes (Signed)
Pt arrived from the [facility] [place]  Via [facility] for a c/o leg pain. Pt in NAD at this time. Pt;s legs bilaterally have been swelling more and the facility was concerned about possible sepsis. Pt is afebrile and operating within his normal baseline. VS WNL.

## 2020-01-12 ENCOUNTER — Emergency Department: Payer: Medicare Other

## 2020-01-12 DIAGNOSIS — L03115 Cellulitis of right lower limb: Secondary | ICD-10-CM | POA: Diagnosis not present

## 2020-01-12 DIAGNOSIS — L97929 Non-pressure chronic ulcer of unspecified part of left lower leg with unspecified severity: Secondary | ICD-10-CM | POA: Insufficient documentation

## 2020-01-12 DIAGNOSIS — L97919 Non-pressure chronic ulcer of unspecified part of right lower leg with unspecified severity: Secondary | ICD-10-CM | POA: Insufficient documentation

## 2020-01-12 DIAGNOSIS — L03116 Cellulitis of left lower limb: Secondary | ICD-10-CM | POA: Diagnosis not present

## 2020-01-12 DIAGNOSIS — J9611 Chronic respiratory failure with hypoxia: Secondary | ICD-10-CM

## 2020-01-12 LAB — CBC WITH DIFFERENTIAL/PLATELET
Abs Immature Granulocytes: 0.08 10*3/uL — ABNORMAL HIGH (ref 0.00–0.07)
Basophils Absolute: 0 10*3/uL (ref 0.0–0.1)
Basophils Relative: 0 %
Eosinophils Absolute: 0.2 10*3/uL (ref 0.0–0.5)
Eosinophils Relative: 2 %
HCT: 40.7 % (ref 39.0–52.0)
Hemoglobin: 13.5 g/dL (ref 13.0–17.0)
Immature Granulocytes: 1 %
Lymphocytes Relative: 4 %
Lymphs Abs: 0.5 10*3/uL — ABNORMAL LOW (ref 0.7–4.0)
MCH: 31.6 pg (ref 26.0–34.0)
MCHC: 33.2 g/dL (ref 30.0–36.0)
MCV: 95.3 fL (ref 80.0–100.0)
Monocytes Absolute: 1.1 10*3/uL — ABNORMAL HIGH (ref 0.1–1.0)
Monocytes Relative: 9 %
Neutro Abs: 10.6 10*3/uL — ABNORMAL HIGH (ref 1.7–7.7)
Neutrophils Relative %: 84 %
Platelets: 189 10*3/uL (ref 150–400)
RBC: 4.27 MIL/uL (ref 4.22–5.81)
RDW: 13.8 % (ref 11.5–15.5)
WBC: 12.5 10*3/uL — ABNORMAL HIGH (ref 4.0–10.5)
nRBC: 0 % (ref 0.0–0.2)

## 2020-01-12 LAB — COMPREHENSIVE METABOLIC PANEL
ALT: 15 U/L (ref 0–44)
AST: 17 U/L (ref 15–41)
Albumin: 3.4 g/dL — ABNORMAL LOW (ref 3.5–5.0)
Alkaline Phosphatase: 67 U/L (ref 38–126)
Anion gap: 5 (ref 5–15)
BUN: 20 mg/dL (ref 8–23)
CO2: 31 mmol/L (ref 22–32)
Calcium: 8.1 mg/dL — ABNORMAL LOW (ref 8.9–10.3)
Chloride: 102 mmol/L (ref 98–111)
Creatinine, Ser: 1 mg/dL (ref 0.61–1.24)
GFR calc Af Amer: >60 mL/min (ref >60)
GFR calc non Af Amer: >60 mL/min (ref >60)
Glucose, Bld: 113 mg/dL — ABNORMAL HIGH (ref 70–99)
Potassium: 4.5 mmol/L (ref 3.5–5.1)
Sodium: 138 mmol/L (ref 135–145)
Total Bilirubin: 1.1 mg/dL (ref 0.3–1.2)
Total Protein: 6.6 g/dL (ref 6.5–8.1)

## 2020-01-12 LAB — CBC
HCT: 35.1 % — ABNORMAL LOW (ref 39.0–52.0)
Hemoglobin: 11.7 g/dL — ABNORMAL LOW (ref 13.0–17.0)
MCH: 31.3 pg (ref 26.0–34.0)
MCHC: 33.3 g/dL (ref 30.0–36.0)
MCV: 93.9 fL (ref 80.0–100.0)
Platelets: 176 10*3/uL (ref 150–400)
RBC: 3.74 MIL/uL — ABNORMAL LOW (ref 4.22–5.81)
RDW: 13.5 % (ref 11.5–15.5)
WBC: 9.8 10*3/uL (ref 4.0–10.5)
nRBC: 0 % (ref 0.0–0.2)

## 2020-01-12 LAB — LACTIC ACID, PLASMA
Lactic Acid, Venous: 1.1 mmol/L (ref 0.5–1.9)
Lactic Acid, Venous: 1.3 mmol/L (ref 0.5–1.9)

## 2020-01-12 LAB — SARS CORONAVIRUS 2 BY RT PCR (HOSPITAL ORDER, PERFORMED IN ~~LOC~~ HOSPITAL LAB): SARS Coronavirus 2: NEGATIVE

## 2020-01-12 MED ORDER — DIAZEPAM 2 MG PO TABS
2.0000 mg | ORAL_TABLET | Freq: Every day | ORAL | Status: DC
  Filled 2020-01-12: qty 1

## 2020-01-12 MED ORDER — LEVOTHYROXINE SODIUM 100 MCG PO TABS: 200.0000 ug | ORAL_TABLET | Freq: Every day | ORAL | Status: DC

## 2020-01-12 MED ORDER — OLANZAPINE 10 MG IM SOLR
2.5000 mg | Freq: Three times a day (TID) | INTRAMUSCULAR | Status: DC | PRN
  Filled 2020-01-12 (×2): qty 10

## 2020-01-12 MED ORDER — OLANZAPINE 10 MG IM SOLR
2.5000 mg | Freq: Once | INTRAMUSCULAR | Status: AC
  Administered 2020-01-12: 2.5 mg via INTRAMUSCULAR
  Filled 2020-01-12: qty 10

## 2020-01-12 MED ORDER — VANCOMYCIN HCL IN DEXTROSE 1-5 GM/200ML-% IV SOLN: 1000.0000 mg | Freq: Once | INTRAVENOUS | Status: DC

## 2020-01-12 MED ORDER — HYDROCODONE-ACETAMINOPHEN 5-325 MG PO TABS
1.0000 | ORAL_TABLET | ORAL | Status: DC | PRN
  Filled 2020-01-12: qty 2
  Filled 2020-01-12 (×2): qty 1

## 2020-01-12 MED ORDER — FUROSEMIDE 40 MG PO TABS
40.0000 mg | ORAL_TABLET | Freq: Every day | ORAL | Status: DC
  Administered 2020-01-13: 40 mg via ORAL
  Filled 2020-01-12: qty 1

## 2020-01-12 MED ORDER — ENOXAPARIN SODIUM 40 MG/0.4ML ~~LOC~~ SOLN
40.0000 mg | SUBCUTANEOUS | Status: DC
  Filled 2020-01-12 (×2): qty 0.4

## 2020-01-12 MED ORDER — SODIUM CHLORIDE 0.9 % IV SOLN
1.0000 g | Freq: Once | INTRAVENOUS | Status: AC
  Administered 2020-01-12: 1 g via INTRAVENOUS
  Filled 2020-01-12: qty 1

## 2020-01-12 MED ORDER — DIAZEPAM 2 MG PO TABS
1.0000 mg | ORAL_TABLET | Freq: Every day | ORAL | Status: DC
  Administered 2020-01-12 – 2020-01-13 (×2): 1 mg via ORAL
  Filled 2020-01-12 (×3): qty 1

## 2020-01-12 MED ORDER — PIPERACILLIN-TAZOBACTAM 3.375 G IVPB 30 MIN: 3.3750 g | Freq: Once | INTRAVENOUS | Status: DC

## 2020-01-12 MED ORDER — VANCOMYCIN HCL IN DEXTROSE 1-5 GM/200ML-% IV SOLN
1000.0000 mg | Freq: Once | INTRAVENOUS | Status: AC
  Administered 2020-01-12: 1000 mg via INTRAVENOUS
  Filled 2020-01-12: qty 200

## 2020-01-12 MED ORDER — VANCOMYCIN HCL 1750 MG/350ML IV SOLN
1750.0000 mg | INTRAVENOUS | Status: DC
  Filled 2020-01-12 (×2): qty 350

## 2020-01-12 MED ORDER — PROMETHAZINE HCL 25 MG PO TABS
12.5000 mg | ORAL_TABLET | Freq: Four times a day (QID) | ORAL | Status: DC | PRN
  Filled 2020-01-12: qty 1

## 2020-01-12 MED ORDER — FUROSEMIDE 10 MG/ML IJ SOLN
40.0000 mg | Freq: Once | INTRAMUSCULAR | Status: AC
  Administered 2020-01-12: 40 mg via INTRAVENOUS
  Filled 2020-01-12: qty 4

## 2020-01-12 MED ORDER — OLANZAPINE 5 MG PO TBDP
15.0000 mg | ORAL_TABLET | Freq: Every day | ORAL | Status: DC
  Filled 2020-01-12 (×2): qty 1

## 2020-01-12 MED ORDER — PIPERACILLIN-TAZOBACTAM 3.375 G IVPB 30 MIN
3.3750 g | Freq: Once | INTRAVENOUS | Status: AC
  Administered 2020-01-12: 3.375 g via INTRAVENOUS
  Filled 2020-01-12: qty 50

## 2020-01-12 MED ORDER — ACETAMINOPHEN 650 MG RE SUPP: 650.0000 mg | Freq: Four times a day (QID) | RECTAL | Status: DC | PRN

## 2020-01-12 MED ORDER — ACETAMINOPHEN 325 MG PO TABS: 650.0000 mg | ORAL_TABLET | Freq: Four times a day (QID) | ORAL | Status: DC | PRN

## 2020-01-12 MED ORDER — SERTRALINE HCL 50 MG PO TABS
25.0000 mg | ORAL_TABLET | Freq: Every day | ORAL | Status: DC
  Filled 2020-01-12 (×2): qty 1

## 2020-01-12 MED ORDER — ACETAMINOPHEN 500 MG PO TABS
1000.0000 mg | ORAL_TABLET | Freq: Once | ORAL | Status: AC
  Administered 2020-01-12: 1000 mg via ORAL
  Filled 2020-01-12: qty 2

## 2020-01-12 MED ORDER — SODIUM CHLORIDE 0.9 % IV SOLN
INTRAVENOUS | Status: DC | PRN
  Administered 2020-01-12: 250 mL via INTRAVENOUS

## 2020-01-12 MED ORDER — PIPERACILLIN-TAZOBACTAM 3.375 G IVPB
3.3750 g | Freq: Three times a day (TID) | INTRAVENOUS | Status: DC
  Filled 2020-01-12: qty 50

## 2020-01-12 NOTE — H&P (Signed)
History and Physical    Eric [name] WGN:[id number] DOB: 01-20-1946 DOA: 01/11/2020  PCP: Patient, No Pcp Per   Patient coming from: [place] [facility] [place]  I have personally briefly reviewed patient's old medical records in [place]  Chief Complaint: Bilateral lower extremity swelling and ulcers  HPI: Eric [name] is a 74 y.o. male with medical history significant for HTN, dementia, chronic combined heart failure, on chronic home O2, paroxysmal atrial fibrillation, hypothyroidism, hospitalized from 5/4 to 11/24/2019 with cellulitis right leg secondary to heel ulceration, returning to the emergency room on 12/04/2019 for aggressive behavior, staying until 4 days ago on 01/07/2020 when he was finally placed at [facility], who was sent back to the emergency room with concerns for cellulitis and sepsis.  History is limited due to patient's dementia and is thus taken from review of ER records and past history ED Course: On arrival he was afebrile and with unremarkable vitals.  Blood work significant for WBC of 12,500, lactic acid 1.1 and mostly unremarkable work-up.  Lower extremity Doppler negative.  Patient was started on IV antibiotics.  Hospitalist consulted for admission.  Review of Systems: Patient was very belligerent and using foul language so unable to obtain review of systems  Past Medical History:  Diagnosis Date  . Acute respiratory failure with hypoxia ([place])    multiple prior admissions to [facility] and [facility] for CHF exacerbation / respiratory failure  . Anxiety and depression   . Atrial fibrillation ([place]) 07/25/2018   new-onset during [facility] admission in Jan 2020  . Cataract   . CHF (congestive heart failure) (HCC)    EF <= 50% as of Jan 2020  . Current use of long term anticoagulation 07/2018   Started on apixaban for new-onset a-fib in Jan 2020  . Dementia ([place])    possible, likely age-related, documented decreased cognitive function on prior hospitalizations  .  Gallstones   . Glaucoma   . Hypertension   . Hypoglycemia   . Hypothyroidism   . Inguinal hernia   . Sleep apnea   . Thyroid disease     Past Surgical History:  Procedure Laterality Date  . FINGER AMPUTATION Right      reports that he quit smoking about 9 years ago. He has never used smokeless tobacco. He reports that he does not drink alcohol and does not use drugs.  Allergies  Allergen Reactions  . Demerol [Meperidine] Other (See Comments)    Will counteract with a drug he is taking causing fatal reaction with nardil  . Epinephrine Other (See Comments)    "Nardil reaction-With epi?" "Allergic," per MAR  . Nardil [Phenelzine] Other (See Comments)    "Allergic," per MAR  . Morphine And Related Other (See Comments)    Reacts with nardil- "Allergic," per MAR  . Ondansetron Other (See Comments)    Made patient want to climb the walls.  . Shellfish Allergy Hives    Family History  Problem Relation Age of Onset  . Alzheimer's disease Mother       Prior to Admission medications   Medication Sig Start Date End Date Taking? Authorizing Provider  acetaminophen (TYLENOL) 325 MG tablet Take 2 tablets (650 mg total) by mouth every 6 (six) hours as needed for mild pain (or Fever >/= 101). Patient taking differently: Take 650 mg by mouth every 6 (six) hours as needed for mild pain or headache (fever >/= 101, or minor discomfort).  11/03/18   Loletha Grayer, MD  albuterol (VENTOLIN HFA) 108 (90 Base) MCG/ACT inhaler Inhale 2 puffs into the lungs every 6 (six) hours as needed for wheezing or shortness of breath. Patient taking differently: Inhale 1 puff into the lungs every 6 (six) hours as needed for wheezing or shortness of breath.  11/21/19   Ghimire, Werner Lean, MD  alum & mag hydroxide-simeth (ANTACID) 200-200-20 MG/5ML suspension Take 30 mLs by mouth 4 (four) times daily as needed for indigestion or heartburn.    [provider]  ascorbic acid (VITAMIN C) 500 MG tablet Take  500 mg by mouth 2 (two) times daily.    [provider]  collagenase (SANTYL) ointment Apply topically daily. Apply to right heel ulcer plus dry dressing change daily 11/22/19   Ghimire, Werner Lean, MD  diazepam (VALIUM) 2 MG tablet Take 0.5-1 tablets (1-2 mg total) by mouth See admin instructions. Take 1 mg by mouth in the morning and 2 mg at bedtime 11/21/19   Ghimire, Werner Lean, MD  diphenhydrAMINE (DIPHENHIST) 25 MG tablet Take 25 mg by mouth every 6 (six) hours as needed (for allergic reactions).    [provider]  furosemide (LASIX) 20 MG tablet Take 2 tablets (40 mg total) by mouth daily. 11/21/19   Ghimire, Werner Lean, MD  guaiFENesin (ROBITUSSIN) 100 MG/5ML liquid Take 300 mg by mouth every 6 (six) hours as needed for cough.    [provider]  levothyroxine (SYNTHROID) 200 MCG tablet Take 1 tablet (200 mcg total) by mouth daily before breakfast. 11/21/19   Ghimire, Werner Lean, MD  liver oil-zinc oxide (DESITIN) 40 % ointment Apply topically daily. Patient taking differently: Apply 1 application topically See admin instructions. Apply to affected area once daily 11/04/18   Alford Highland, MD  loperamide (IMODIUM A-D) 2 MG tablet Take 4 mg by mouth every 3 (three) hours as needed for diarrhea or loose stools (CANNOT EXCEED 8 DOSES/24 HOURS).    [provider]  magnesium hydroxide (MILK OF MAGNESIA) 400 MG/5ML suspension Take 30 mLs by mouth daily as needed for mild constipation.    [provider]  metoCLOPramide (REGLAN) 5 MG tablet Take 5 mg by mouth every 6 (six) hours as needed for nausea or vomiting.    [provider]  Multiple Vitamins-Minerals (THERATRUM COMPLETE) TABS Take 1 tablet by mouth daily with breakfast.    [provider]  OLANZapine zydis (ZYPREXA) 10 MG disintegrating tablet Take 1 tablet (10 mg total) by mouth 2 (two) times daily. 11/03/18   Alford Highland, MD  OXYGEN Inhale 2 L/min into the lungs See admin  instructions. "2 L/min, as tolerated"    [provider]  sertraline (ZOLOFT) 25 MG tablet Take 1 tablet (25 mg total) by mouth daily. 06/10/19   Charm Rings, NP  Skin Protectants, Misc. (EUCERIN) cream Apply 1 application topically See admin instructions. Apply to both legs 2 times a day    [provider]    Physical Exam: Vitals:   01/11/20 2353 01/11/20 2354 01/11/20 2356 01/12/20 0207  BP:  131/83  120/79  Pulse:  88  90  Resp:  (!) 21  16  Temp:  98.1 F (36.7 C)    TempSrc:  Oral    SpO2: 97% 95%  97%  Weight:   105.9 kg   Height:   6' (1.829 m)      Vitals:   01/11/20 2353 01/11/20 2354 01/11/20 2356 01/12/20 0207  BP:  131/83  120/79  Pulse:  88  90  Resp:  (!) 21  16  Temp:  98.1 F (36.7 C)    TempSrc:  Oral    SpO2: 97% 95%  97%  Weight:   105.9 kg   Height:   6' (1.829 m)       Constitutional: Alert, very belligerent using foul language, cursing when asked any questions so unable to as sess orientation.  He does not appear to be in apparent distress.  HEENT:      Head: Normocephalic and atraumatic.         Eyes: PERLA, EOMI, Conjunctivae are normal. Sclera is non-icteric.       Mouth/Throat: Mucous membranes are moist.       Neck: Supple with no signs of meningismus. Cardiovascular: Regular rate and rhythm. No murmurs, gallops, or rubs. 2+ symmetrical distal pulses are present . No JVD. 2+LE edema Respiratory: Respiratory effort normal .Lungs sounds clear bilaterally. No wheezes, crackles, or rhonchi.  Gastrointestinal: Soft, non tender, and non distended with positive bowel sounds. No rebound or guarding. Genitourinary: No CVA tenderness. Musculoskeletal: Nontender with normal range of motion in all extremities.  Swelling, with erythema and crusting bilateral lower extremity up to mid lower leg, overgrown toenails Neurologic: Normal speech and language. Face is symmetric. Moving all extremities. No gross focal neurologic deficits  . Skin: Swelling, with erythema and crusting bilateral lower extremity up to mid lower leg, overgrown toenails Psychiatric: Patient aggressive grabbing at the nurse, cursing, minimal cooperation with exam  Labs on Admission: I have personally reviewed following labs and imaging studies  CBC: Recent Labs  Lab 01/12/20 0000  WBC 12.5*  NEUTROABS 10.6*  HGB 13.5  HCT 40.7  MCV 95.3  PLT 189   Basic Metabolic Panel: Recent Labs  Lab 01/12/20 0150  NA 138  K 4.5  CL 102  CO2 31  GLUCOSE 113*  BUN 20  CREATININE 1.00  CALCIUM 8.1*   GFR: Estimated Creatinine Clearance: 81.5 mL/min (by C-G formula based on SCr of 1 mg/dL). Liver Function Tests: Recent Labs  Lab 01/12/20 0150  AST 17  ALT 15  ALKPHOS 67  BILITOT 1.1  PROT 6.6  ALBUMIN 3.4*   No results for input(s): LIPASE, AMYLASE in the last 168 hours. No results for input(s): AMMONIA in the last 168 hours. Coagulation Profile: No results for input(s): INR, PROTIME in the last 168 hours. Cardiac Enzymes: No results for input(s): CKTOTAL, CKMB, CKMBINDEX, TROPONINI in the last 168 hours. BNP (last 3 results) No results for input(s): PROBNP in the last 8760 hours. HbA1C: No results for input(s): HGBA1C in the last 72 hours. CBG: No results for input(s): GLUCAP in the last 168 hours. Lipid Profile: No results for input(s): CHOL, HDL, LDLCALC, TRIG, CHOLHDL, LDLDIRECT in the last 72 hours. Thyroid Function Tests: No results for input(s): TSH, T4TOTAL, FREET4, T3FREE, THYROIDAB in the last 72 hours. Anemia Panel: No results for input(s): VITAMINB12, FOLATE, FERRITIN, TIBC, IRON, RETICCTPCT in the last 72 hours. Urine analysis:    Component Value Date/Time   COLORURINE YELLOW (A) 12/11/2019 0131   APPEARANCEUR CLEAR (A) 12/11/2019 0131   LABSPEC 1.018 12/11/2019 0131   PHURINE 7.0 12/11/2019 0131   GLUCOSEU NEGATIVE 12/11/2019 0131   HGBUR NEGATIVE 12/11/2019 0131   BILIRUBINUR NEGATIVE 12/11/2019 0131    KETONESUR NEGATIVE 12/11/2019 0131   PROTEINUR NEGATIVE 12/11/2019 0131   UROBILINOGEN 0.2 08/17/2011 2150   NITRITE NEGATIVE 12/11/2019 0131   LEUKOCYTESUR NEGATIVE 12/11/2019 0131    Radiological Exams on  Admission: US Venous Img Lower Bilateral  Result Date: 01/12/2020 CLINICAL DATA:  Leg pain and swelling, prolonged hospitalization EXAM: BILATERAL LOWER EXTREMITY VENOUS DOPPLER ULTRASOUND TECHNIQUE: Gray-scale sonography with compression, as well as color and duplex ultrasound, were performed to evaluate the deep venous system(s) from the level of the common femoral vein through the popliteal and proximal calf veins. COMPARISON:  Ultrasound 06/20/2019 FINDINGS: VENOUS Normal compressibility of the common femoral, superficial femoral, and popliteal veins. Slightly suboptimal visualization of calf veins bilaterally. Unable to assess compression though normal color flow is seen on longitudinal imaging. Visualized portions of profunda femoral vein and great saphenous vein unremarkable. No filling defects to suggest DVT on grayscale or color Doppler imaging. Doppler waveforms show normal direction of venous flow, normal respiratory plasticity and response to augmentation. OTHER None. Limitations: Suboptimal visualization of the bilateral calf veins. IMPRESSION: No femoropopliteal DVT nor evidence of DVT within the visualized calf veins although compression assessment of the calf veins is limited. If clinical symptoms are inconsistent or if there are persistent or worsening symptoms, further imaging (possibly involving the iliac veins) may be warranted. Electronically Signed   By: Kreg Shropshire M.D.   On: 01/12/2020 01:25    EKG: Independently reviewed. Interpretation : Normal sinus rhythm  Assessment/Plan Principal Problem:   Bilateral lower leg cellulitis Active Problems:   Venous stasis ulcer of both lower extremities without varicose veins (HCC)   Generalized anxiety disorder   Chronic combined  systolic and diastolic CHF (congestive heart failure) (HCC)   Hypothyroidism   PAF (paroxysmal atrial fibrillation) (HCC)   HTN (hypertension)   Chronic respiratory failure with hypoxia (HCC)   Bilateral lower extremity cellulitis Chronic venous stasis -Continue Zosyn and vancomycin -Wound care consult -Keep leg elevated  Chronic combined systolic and diastolic heart failure: -Euvolemic -Continue  home diuretics  Chronic hypoxic respiratory failure: -SNF medication lists O2 2 L/min as needed, suspect related to O2 needs from his CHF.  No history of COPD -Continue supplemental oxygen as needed  HTN: -Continue home blood pressure meds  History of PAF: -Sinus rhythm- -Suspect not on anticoagulation given advanced dementia/poor candidate.  Hypothyroidism: -Continue home levothyroxine  Dementia  delirium: -COntinue olanzapine, Zoloft, and Valium.   DVT prophylaxis: Lovenox  Code Status: full code  Family Communication:  none  Disposition Plan: Back to previous home environment Consults called: none  Status:obs    Andris Baumann MD [facility]     01/12/2020, 3:11 AM

## 2020-01-12 NOTE — Progress Notes (Signed)
PROGRESS NOTE    Eric [name]  IRJ:[id number] DOB: 04/30/1946 DOA: 01/11/2020 PCP: Patient, No Pcp Per   Brief Narrative:  HPI on 01/12/2020 by Dr. Lindajo Royal [name] is a 74 y.o. male with medical history significant for HTN, dementia, chronic combined heart failure, on chronic home O2, paroxysmal atrial fibrillation, hypothyroidism, hospitalized from 5/4 to 11/24/2019 with cellulitis right leg secondary to heel ulceration, returning to the emergency room on 12/04/2019 for aggressive behavior, staying until 4 days ago on 01/07/2020 when he was finally placed at [facility], who was sent back to the emergency room with concerns for cellulitis and sepsis.  History is limited due to patient's dementia and is thus taken from review of ER records and past history  Assessment & Plan   Admitted earlier [name] by Dr. Para March.  See H&P for details.  Bilateral lower extremity cellulitis/chronic venous stasis -Do not feel that this is truly cellulitis -Likely chronic venous stasis -Wound care consult -Keep lower extremities elbow -Patient was given vancomycin and Zosyn however not allowing for medications at this time -Will repeat CBC, if leukocytosis has resolved will discontinue IV antibiotics; however if worsened, will place on IM ceftriaxone until is able to tolerate oral medications  Combined systolic and diastolic heart failure -Patient appears to be euvolemic at this point, continue home medications- lasix -Monitor intake and output, daily weight  Chronic hypoxic respiratory failure -Patient has 2 L oxygen listed on his medication list as needed -Possibly secondary to heart failure.  Patient has no history of COPD -Continue supplemental oxygen as needed  Essential hypertension -Continue Lasix  History of PAF -Currently in sinus rhythm -Not on anticoagulation likely secondary to advanced dementia  Hypothyroidism -Continue Synthroid  Dementia/delirium -Continue  olanzapine, Zoloft, Valium -Patient is not allowing for all oral medications, will place him on IM olanzapine 2.5 mg every 8 hours as needed for agitation  DVT Prophylaxis  lovenox  Code Status: Full  Family Communication: none at bedside  Disposition Plan:  Status is: Observation  The patient remains OBS appropriate and will d/c before 2 midnights.  Dispo: The patient is from: SNF              Anticipated d/c is to: SNF              Anticipated d/c date is: 1 day              Patient currently is not medically stable to d/c.  Consultants none  Procedures  none  Antibiotics   Anti-infectives (From admission, onward)   Start     Dose/Rate Route Frequency Ordered Stop   01/12/20 1500  vancomycin (VANCOREADY) IVPB 1750 mg/350 mL     Discontinue     1,750 mg 175 mL/hr over 120 Minutes Intravenous Every 24 hours 01/12/20 0343     01/12/20 1200  piperacillin-tazobactam (ZOSYN) IVPB 3.375 g     Discontinue     3.375 g 12.5 mL/hr over 240 Minutes Intravenous Every 8 hours 01/12/20 0340     01/12/20 0400  piperacillin-tazobactam (ZOSYN) IVPB 3.375 g  Status:  Discontinued        3.375 g 100 mL/hr over 30 Minutes Intravenous  Once 01/12/20 0310 01/12/20 0338   01/12/20 0400  piperacillin-tazobactam (ZOSYN) IVPB 3.375 g        3.375 g 100 mL/hr over 30 Minutes Intravenous  Once 01/12/20 0339 01/12/20 0654   01/12/20 0315  vancomycin (VANCOCIN) IVPB 1000 mg/200 mL  premix  Status:  Discontinued        1,000 mg 200 mL/hr over 60 Minutes Intravenous  Once 01/12/20 0310 01/12/20 0317   01/12/20 0030  ceFEPIme (MAXIPIME) 1 g in sodium chloride 0.9 % 100 mL IVPB        1 g 200 mL/hr over 30 Minutes Intravenous  Once 01/12/20 0018 01/12/20 0325   01/12/20 0030  vancomycin (VANCOCIN) IVPB 1000 mg/200 mL premix        1,000 mg 200 mL/hr over 60 Minutes Intravenous  Once 01/12/20 0018 01/12/20 0248      Subjective:   Eric [name].  Does not want to talk.    Objective:   Vitals:   01/11/20 2356 01/12/20 0207 01/12/20 0400 01/12/20 0633  BP:  120/79 105/64 105/60  Pulse:  90 (!) 51 (!) 46  Resp:  16 11 16   Temp:    97.7 F (36.5 C)  TempSrc:    Axillary  SpO2:  97% 97% 99%  Weight: 105.9 kg     Height: 6' (1.829 m)       Intake/Output Summary (Last 24 hours) at 01/12/2020 0919 Last data filed at 01/12/2020 0500 Gross per 24 hour  Intake 300 ml  Output 450 ml  Net -150 ml   Filed Weights   01/11/20 2356  Weight: 105.9 kg    Exam Lower extremities with mild erythema   Data Reviewed: I have personally reviewed following labs and imaging studies  CBC: Recent Labs  Lab 01/12/20 0000  WBC 12.5*  NEUTROABS 10.6*  HGB 13.5  HCT 40.7  MCV 95.3  PLT 189   Basic Metabolic Panel: Recent Labs  Lab 01/12/20 0150  NA 138  K 4.5  CL 102  CO2 31  GLUCOSE 113*  BUN 20  CREATININE 1.00  CALCIUM 8.1*   GFR: Estimated Creatinine Clearance: 81.5 mL/min (by C-G formula based on SCr of 1 mg/dL). Liver Function Tests: Recent Labs  Lab 01/12/20 0150  AST 17  ALT 15  ALKPHOS 67  BILITOT 1.1  PROT 6.6  ALBUMIN 3.4*   No results for input(s): LIPASE, AMYLASE in the last 168 hours. No results for input(s): AMMONIA in the last 168 hours. Coagulation Profile: No results for input(s): INR, PROTIME in the last 168 hours. Cardiac Enzymes: No results for input(s): CKTOTAL, CKMB, CKMBINDEX, TROPONINI in the last 168 hours. BNP (last 3 results) No results for input(s): PROBNP in the last 8760 hours. HbA1C: No results for input(s): HGBA1C in the last 72 hours. CBG: No results for input(s): GLUCAP in the last 168 hours. Lipid Profile: No results for input(s): CHOL, HDL, LDLCALC, TRIG, CHOLHDL, LDLDIRECT in the last 72 hours. Thyroid Function Tests: No results for input(s): TSH, T4TOTAL, FREET4, T3FREE, THYROIDAB in the last 72 hours. Anemia Panel: No results for input(s): VITAMINB12, FOLATE, FERRITIN, TIBC, IRON,  RETICCTPCT in the last 72 hours. Urine analysis:    Component Value Date/Time   COLORURINE YELLOW (A) 12/11/2019 0131   APPEARANCEUR CLEAR (A) 12/11/2019 0131   LABSPEC 1.018 12/11/2019 0131   PHURINE 7.0 12/11/2019 0131   GLUCOSEU NEGATIVE 12/11/2019 0131   HGBUR NEGATIVE 12/11/2019 0131   BILIRUBINUR NEGATIVE 12/11/2019 0131   KETONESUR NEGATIVE 12/11/2019 0131   PROTEINUR NEGATIVE 12/11/2019 0131   UROBILINOGEN 0.2 08/17/2011 2150   NITRITE NEGATIVE 12/11/2019 0131   LEUKOCYTESUR NEGATIVE 12/11/2019 0131   Sepsis Labs: @LABRCNTIP (procalcitonin:4,lacticidven:4)  ) Recent Results (from the past 240 hour(s))  Blood culture (  routine x 2)     Status: None (Preliminary result)   Collection Time: 01/12/20  1:47 AM   Specimen: BLOOD  Result Value Ref Range Status   Specimen Description BLOOD LEFT ANTECUBITAL  Final   Special Requests   Final    BOTTLES DRAWN AEROBIC AND ANAEROBIC Blood Culture results may not be optimal due to an inadequate volume of blood received in culture bottles   Culture   Final    NO GROWTH < 12 HOURS Performed at [facility], [address] [place], [place] [address]    Report Status PENDING  Incomplete  Blood culture (routine x 2)     Status: None (Preliminary result)   Collection Time: 01/12/20  1:49 AM   Specimen: BLOOD  Result Value Ref Range Status   Specimen Description BLOOD RIGHT ANTECUBITAL  Final   Special Requests   Final    BOTTLES DRAWN AEROBIC AND ANAEROBIC Blood Culture results may not be optimal due to an inadequate volume of blood received in culture bottles   Culture   Final    NO GROWTH < 12 HOURS Performed at [facility], [address], [place], [place] [address]    Report Status PENDING  Incomplete  SARS Coronavirus 2 by RT PCR (hospital order, performed in [place] hospital lab) Nasopharyngeal Nasopharyngeal Swab     Status: None   Collection Time: 01/12/20  2:53 AM   Specimen: Nasopharyngeal Swab   Result Value Ref Range Status   SARS Coronavirus 2 NEGATIVE NEGATIVE Final    Comment: (NOTE) SARS-CoV-2 target nucleic acids are NOT DETECTED.  The SARS-CoV-2 RNA is generally detectable in upper and lower respiratory specimens during the acute phase of infection. The lowest concentration of SARS-CoV-2 viral copies this assay can detect is 250 copies / mL. A negative result does not preclude SARS-CoV-2 infection and should not be used as the sole basis for treatment or other patient management decisions.  A negative result may occur with improper specimen collection / handling, submission of specimen other than nasopharyngeal swab, presence of viral mutation(s) within the areas targeted by this assay, and inadequate number of viral copies (<250 copies / mL). A negative result must be combined with clinical observations, patient history, and epidemiological information.  Fact Sheet for Patients:   StrictlyIdeas.no  Fact Sheet for Healthcare Providers: BankingDealers.co.za  This test is not yet approved or  cleared by the [place] FDA and has been authorized for detection and/or diagnosis of SARS-CoV-2 by FDA under an Emergency Use Authorization (EUA).  This EUA will remain in effect (meaning this test can be used) for the duration of the COVID-19 declaration under Section 564(b)(1) of the Act, 21 U.S.C. section 360bbb-3(b)(1), unless the authorization is terminated or revoked sooner.  Performed at [facility], [address] [place], [place] [address]       Radiology Studies: US Venous Img Lower Bilateral  Result Date: 01/12/2020 CLINICAL DATA:  Leg pain and swelling, prolonged hospitalization EXAM: BILATERAL LOWER EXTREMITY VENOUS DOPPLER ULTRASOUND TECHNIQUE: Gray-scale sonography with compression, as well as color and duplex ultrasound, were performed to evaluate the deep venous system(s) from the level of the  common femoral vein through the popliteal and proximal calf veins. COMPARISON:  Ultrasound 06/20/2019 FINDINGS: VENOUS Normal compressibility of the common femoral, superficial femoral, and popliteal veins. Slightly suboptimal visualization of calf veins bilaterally. Unable to assess compression though normal color flow is seen on longitudinal imaging. Visualized portions of profunda femoral  vein and great saphenous vein unremarkable. No filling defects to suggest DVT on grayscale or color Doppler imaging. Doppler waveforms show normal direction of venous flow, normal respiratory plasticity and response to augmentation. OTHER None. Limitations: Suboptimal visualization of the bilateral calf veins. IMPRESSION: No femoropopliteal DVT nor evidence of DVT within the visualized calf veins although compression assessment of the calf veins is limited. If clinical symptoms are inconsistent or if there are persistent or worsening symptoms, further imaging (possibly involving the iliac veins) may be warranted. Electronically Signed   By: Kreg Shropshire M.D.   On: 01/12/2020 01:25     Scheduled Meds: . diazepam  1 mg Oral Daily  . diazepam  2 mg Oral QHS  . enoxaparin (LOVENOX) injection  40 mg Subcutaneous Q24H  . OLANZapine zydis  15 mg Oral QHS  . sertraline  25 mg Oral Daily   Continuous Infusions: . sodium chloride 250 mL (01/12/20 0623)  . piperacillin-tazobactam (ZOSYN)  IV    . vancomycin       LOS: 0 days   Time Spent in minutes   30 minutes  Eldra Word D.O. on 01/12/2020 at 9:19 AM  Between 7am to 7pm - Please see pager noted on [email or web address]  After 7pm go to [email or web address]  And look for the night coverage person covering for me after hours  [facility] Office  [phone]

## 2020-01-12 NOTE — Progress Notes (Signed)
Patient intermittently sleeping but when awake refusing medications, IV start, labs.  Also refusing to allow [place] to change his bed or clean him up.  Continue to monitor.

## 2020-01-12 NOTE — Progress Notes (Signed)
Patient asleep throughout morning.  Awoke late morning, NT assisted to eat breakfast with good appetite.  Patient then began yelling out "Help!" loudly and repeatedly and becoming agitated.  Attempted to administer daily medications however patient refused stating we "were trying to kill him".  Attempted to wrap IV with gauze to protect site however patient became belligerent and combative; patient then pulled out IV.  MD contacted and zyprexa given IV.  Patient is now resting quietly.

## 2020-01-12 NOTE — Progress Notes (Signed)
Patient received from the ED, Dr Para March present, pt being verbally abusive to staff, using curse words states "get the hell out" "leave me the hell alone you fucking bitch". He is swinging arms trying to hit staff. External cath applied.

## 2020-01-12 NOTE — ED Provider Notes (Signed)
[facility] Emergency Department Provider Note  ____________________________________________  Time seen: Approximately 2:34 AM  I have reviewed the triage vital signs and the nursing notes.   HISTORY  Chief Complaint Leg Pain  Level 5 caveat:  Portions of the history and physical were unable to be obtained due to dementia   HPI Eric [name] is a 74 y.o. male with a history of A. fib, CHF, dementia, hypertension, hypothyroidism who presents for evaluation of bilateral leg pain and swelling.  Patient is complaining of progressively worsening swelling of both lower extremities and pain.  Has been unable to walk due to the pain.  No fever, no shortness of breath, no chest pain, no nausea or vomiting.  No prior history of PE or DVT.  Past Medical History:  Diagnosis Date  . Acute respiratory failure with hypoxia ([place])    multiple prior admissions to [facility] and [facility] for CHF exacerbation / respiratory failure  . Anxiety and depression   . Atrial fibrillation ([place]) 07/25/2018   new-onset during [place] admission in Jan 2020  . Cataract   . CHF (congestive heart failure) (H[facility])    EF <= 50% as of Jan 2020  . Current use of long term anticoagulation 07/2018   Started on apixaban for new-onset a-fib in Jan 2020  . Dementia ([place])    possible, likely age-related, documented decreased cognitive function on prior hospitalizations  . Gallstones   . Glaucoma   . Hypertension   . Hypoglycemia   . Hypothyroidism   . Inguinal hernia   . Sleep apnea   . Thyroid disease     Patient Active Problem List   Diagnosis Date Noted  . Cellulitis of right lower extremity   . Severe protein-calorie malnutrition ([place])   . Right foot ulcer ([place]) 11/16/2019  . Anxiety and depression   . Cellulitis of right foot   . 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 Chronic combined systolic and diastolic CHF (congestive heart failure) ([place])  11/16/2018  . Anasarca 11/16/2018  . Hypothyroidism 11/16/2018  . PAF (paroxysmal atrial fibrillation) ([place]) 11/16/2018  . HTN (hypertension) 11/16/2018  . Generalized anxiety disorder 11/03/2018  . Major depression in partial remission ([place]) 11/03/2018  . Acute hypoxemic respiratory failure ([place]) 10/29/2018  . Suspected COVID-19 virus infection 10/29/2018    Past Surgical History:  Procedure Laterality Date  . FINGER AMPUTATION Right     Prior to Admission medications   Medication Sig Start Date End Date Taking? Authorizing Provider  acetaminophen (TYLENOL) 325 MG tablet Take 2 tablets (650 mg total) by mouth every 6 (six) hours as needed for mild pain (or Fever >/= 101). Patient taking differently: Take 650 mg by mouth every 6 (six) hours as needed for mild pain or headache (fever >/= 101, or minor discomfort).  11/03/18   Loletha Grayer, MD  albuterol (VENTOLIN HFA) 108 (90 Base) MCG/ACT inhaler Inhale 2 puffs into the lungs every 6 (six) hours as needed for wheezing or shortness of breath. Patient taking differently: Inhale 1 puff into the lungs every 6 (six) hours as needed for wheezing or shortness of breath.  11/21/19   Ghimire, Henreitta Leber, MD  alum & mag hydroxide-simeth (ANTACID) 200-200-20 MG/5ML suspension Take 30 mLs by mouth 4 (four) times daily as needed for indigestion or heartburn.    [provider]  ascorbic acid (VITAMIN C) 500 MG tablet Take 500 mg by mouth 2 (two) times daily.  [provider]  collagenase (SANTYL) ointment Apply topically daily. Apply to right heel ulcer plus dry dressing change daily 11/22/19   Ghimire, Werner Lean, MD  diazepam (VALIUM) 2 MG tablet Take 0.5-1 tablets (1-2 mg total) by mouth See admin instructions. Take 1 mg by mouth in the morning and 2 mg at bedtime 11/21/19   Ghimire, Werner Lean, MD  diphenhydrAMINE (DIPHENHIST) 25 MG tablet Take 25 mg by mouth every 6 (six) hours as needed (for allergic reactions).    [provider]  furosemide (LASIX) 20 MG tablet Take 2 tablets (40 mg total) by mouth daily. 11/21/19   Ghimire, Werner Lean, MD  guaiFENesin (ROBITUSSIN) 100 MG/5ML liquid Take 300 mg by mouth every 6 (six) hours as needed for cough.    [provider]  levothyroxine (SYNTHROID) 200 MCG tablet Take 1 tablet (200 mcg total) by mouth daily before breakfast. 11/21/19   Ghimire, Werner Lean, MD  liver oil-zinc oxide (DESITIN) 40 % ointment Apply topically daily. Patient taking differently: Apply 1 application topically See admin instructions. Apply to affected area once daily 11/04/18   Alford Highland, MD  loperamide (IMODIUM A-D) 2 MG tablet Take 4 mg by mouth every 3 (three) hours as needed for diarrhea or loose stools (CANNOT EXCEED 8 DOSES/24 HOURS).    [provider]  magnesium hydroxide (MILK OF MAGNESIA) 400 MG/5ML suspension Take 30 mLs by mouth daily as needed for mild constipation.    [provider]  metoCLOPramide (REGLAN) 5 MG tablet Take 5 mg by mouth every 6 (six) hours as needed for nausea or vomiting.    [provider]  Multiple Vitamins-Minerals (THERATRUM COMPLETE) TABS Take 1 tablet by mouth daily with breakfast.    [provider]  OLANZapine zydis (ZYPREXA) 10 MG disintegrating tablet Take 1 tablet (10 mg total) by mouth 2 (two) times daily. 11/03/18   Alford Highland, MD  OXYGEN Inhale 2 L/min into the lungs See admin instructions. "2 L/min, as tolerated"    [provider]  sertraline (ZOLOFT) 25 MG tablet Take 1 tablet (25 mg total) by mouth daily. 06/10/19   Charm Rings, NP  Skin Protectants, Misc. (EUCERIN) cream Apply 1 application topically See admin instructions. Apply to both legs 2 times a day    [provider]    Allergies Demerol [meperidine], Epinephrine, Nardil [phenelzine], Morphine and related, Ondansetron, and Shellfish allergy  Family History  Problem Relation Age of Onset  . Alzheimer's disease  Mother     Social History Social History   Tobacco Use  . Smoking status: Former Smoker    Quit date: 10/18/2010    Years since quitting: 9.2  . Smokeless tobacco: Never Used  Substance Use Topics  . Alcohol use: No  . Drug use: No    Review of Systems  Constitutional: Negative for fever. Eyes: Negative for visual changes. ENT: Negative for sore throat. Neck: No neck pain  Cardiovascular: Negative for chest pain. Respiratory: Negative for shortness of breath. Gastrointestinal: Negative for abdominal pain, vomiting or diarrhea. Genitourinary: Negative for dysuria. Musculoskeletal: Negative for back pain. + b/l leg pain and swelling Skin: Negative for rash. Neurological: Negative for headaches, weakness or numbness. Psych: No SI or HI  ____________________________________________   PHYSICAL EXAM:  VITAL SIGNS: ED Triage Vitals  Enc Vitals Group     BP 01/11/20 2354 131/83     Pulse Rate 01/11/20 2354 88     Resp 01/11/20 2354 (!) 21  Temp 01/11/20 2354 98.1 F (36.7 C)     Temp Source 01/11/20 2354 Oral     SpO2 01/11/20 2353 97 %     Weight 01/11/20 2356 233 lb 8 oz (105.9 kg)     Height 01/11/20 2356 6' (1.829 m)     Head Circumference --      Peak Flow --      Pain Score 01/11/20 2355 5     Pain Loc --      Pain Edu? --      Excl. in GC? --     Constitutional: Alert and oriented. Well appearing and in no apparent distress. HEENT:      Head: Normocephalic and atraumatic.         Eyes: Conjunctivae are normal. Sclera is non-icteric.       Mouth/Throat: Mucous membranes are moist.       Neck: Supple with no signs of meningismus. Cardiovascular: Regular rate and rhythm. No murmurs, gallops, or rubs. 2+ symmetrical distal pulses are present in all extremities. No JVD. Respiratory: Normal respiratory effort. Lungs are clear to auscultation  Gastrointestinal: Soft, non tender. Musculoskeletal: 2+ pitting edema bilateral lower extremities with overlying  erythema and warmth and two small open ulcers on the R foot. Neurologic: Normal speech and language. Face is symmetric. Moving all extremities. No gross focal neurologic deficits are appreciated. Skin: Skin is warm, dry and intact. No rash noted. Psychiatric: Mood and affect are normal. Speech and behavior are normal.  ____________________________________________   LABS (all labs ordered are listed, but only abnormal results are displayed)  Labs Reviewed  CBC WITH DIFFERENTIAL/PLATELET - Abnormal; Notable for the following components:      Result Value   WBC 12.5 (*)    Neutro Abs 10.6 (*)    Lymphs Abs 0.5 (*)    Monocytes Absolute 1.1 (*)    Abs Immature Granulocytes 0.08 (*)    All other components within normal limits  COMPREHENSIVE METABOLIC PANEL - Abnormal; Notable for the following components:   Glucose, Bld 113 (*)    Calcium 8.1 (*)    Albumin 3.4 (*)    All other components within normal limits  CULTURE, BLOOD (ROUTINE X 2)  CULTURE, BLOOD (ROUTINE X 2)  LACTIC ACID, PLASMA  LACTIC ACID, PLASMA   ____________________________________________  EKG  ED ECG REPORT I, Nita Sickle, the attending physician, personally viewed and interpreted this ECG.  Normal sinus rhythm, rate of 99, normal intervals, normal axis, no ST elevations or depressions. ____________________________________________  RADIOLOGY  I have personally reviewed the images performed during this visit and I agree with the Radiologist's read.   Interpretation by Radiologist:  US Venous Img Lower Bilateral  Result Date: 01/12/2020 CLINICAL DATA:  Leg pain and swelling, prolonged hospitalization EXAM: BILATERAL LOWER EXTREMITY VENOUS DOPPLER ULTRASOUND TECHNIQUE: Gray-scale sonography with compression, as well as color and duplex ultrasound, were performed to evaluate the deep venous system(s) from the level of the common femoral vein through the popliteal and proximal calf veins. COMPARISON:   Ultrasound 06/20/2019 FINDINGS: VENOUS Normal compressibility of the common femoral, superficial femoral, and popliteal veins. Slightly suboptimal visualization of calf veins bilaterally. Unable to assess compression though normal color flow is seen on longitudinal imaging. Visualized portions of profunda femoral vein and great saphenous vein unremarkable. No filling defects to suggest DVT on grayscale or color Doppler imaging. Doppler waveforms show normal direction of venous flow, normal respiratory plasticity and response to augmentation. OTHER None. Limitations: Suboptimal visualization  of the bilateral calf veins. IMPRESSION: No femoropopliteal DVT nor evidence of DVT within the visualized calf veins although compression assessment of the calf veins is limited. If clinical symptoms are inconsistent or if there are persistent or worsening symptoms, further imaging (possibly involving the iliac veins) may be warranted. Electronically Signed   By: Kreg Shropshire M.D.   On: 01/12/2020 01:25     ____________________________________________   PROCEDURES  Procedure(s) performed:yes .1-3 Lead EKG Interpretation Performed by: Nita Sickle, MD Authorized by: Nita Sickle, MD     Interpretation: non-specific     ECG rate assessment: normal     Rhythm: sinus rhythm     Ectopy: none     Critical Care performed:  None ____________________________________________   INITIAL IMPRESSION / ASSESSMENT AND PLAN / ED COURSE  74 y.o. male with a history of A. fib, CHF, dementia, hypertension, hypothyroidism who presents for evaluation of bilateral leg pain and swelling.  Patient does have signs  of cellulitis on bilateral lower extremities and swelling from possible CHF which is exacerbating the infection.  He had a prolonged stay with [place] being discharged to a nursing home few days ago.  Therefore Doppler studies were done to rule out DVT and those were negative.  There are no signs of sepsis.   Patient does have leukocytosis of 12.  No significant electrolyte derangements.  Patient with difficulty walking due to the pain therefore will admit to the hospitalist service for IV antibiotics and diuresis.  Patient given IV cefepime and Vanco, 40 mg of IV Lasix, and p.o. Tylenol for pain.  Will discuss with the hospitalist for admission.  Old medical records reviewed.      _____________________________________________ Please note:  Patient was evaluated in Emergency Department today for the symptoms described in the history of present illness. Patient was evaluated in the context of the global COVID-19 pandemic, which necessitated consideration that the patient might be at risk for infection with the SARS-CoV-2 virus that causes COVID-19. Institutional protocols and algorithms that pertain to the evaluation of patients at risk for COVID-19 are in a state of rapid change based on information released by regulatory bodies including the CDC and federal and state organizations. These policies and algorithms were followed during the patient's care in the ED.  Some ED evaluations and interventions may be delayed as a result of limited staffing during the pandemic.   [place] Controlled Substance Database was reviewed by me. ____________________________________________   FINAL CLINICAL IMPRESSION(S) / ED DIAGNOSES   Final diagnoses:  Cellulitis of lower extremity, unspecified laterality  Leg swelling      NEW MEDICATIONS STARTED DURING THIS VISIT:  ED Discharge Orders    None       Note:  This document was prepared using Dragon voice recognition software and may include unintentional dictation errors.    Don Perking, [place], MD 01/12/20 [phone]

## 2020-01-12 NOTE — Progress Notes (Signed)
Pharmacy Antibiotic Note  Eric [name] is a 74 y.o. male admitted on 01/11/2020 with cellulitis.  Pharmacy has been consulted for Vancomycin and Zosyn dosing.  Plan: Zosyn 3.375g IV q8h (4 hour infusion).   Vancomycin 1750 mg IV Q 24 hrs. Goal AUC 400-550. Expected AUC: 521, Css min 10.5 SCr used: 1.0 Pt did not receive a full loading dose so will give next dose early  Height: 6' (182.9 cm) Weight: 105.9 kg (233 lb 8 oz) IBW/kg (Calculated) : 77.6  Temp (24hrs), Avg:98.1 F (36.7 C), Min:98.1 F (36.7 C), Max:98.1 F (36.7 C)  Recent Labs  Lab 01/12/20 0000 01/12/20 0146 01/12/20 0150  WBC 12.5*  --   --   CREATININE  --   --  1.00  LATICACIDVEN  --  1.1  --     Estimated Creatinine Clearance: 81.5 mL/min (by C-G formula based on SCr of 1 mg/dL).    Allergies  Allergen Reactions  . Demerol [Meperidine] Other (See Comments)    Will counteract with a drug he is taking causing fatal reaction with nardil  . Epinephrine Other (See Comments)    "Nardil reaction-With epi?" "Allergic," per MAR  . Nardil [Phenelzine] Other (See Comments)    "Allergic," per MAR  . Morphine And Related Other (See Comments)    Reacts with nardil- "Allergic," per MAR  . Ondansetron Other (See Comments)    Made patient want to climb the walls.  . Shellfish Allergy Hives    Antimicrobials this admission:   >>    >>   Dose adjustments this admission:   Microbiology results:  BCx:   UCx:    Sputum:    MRSA PCR:   Thank you for allowing pharmacy to be a part of this patient's care.  Valrie Hart A 01/12/2020 3:44 AM

## 2020-01-13 DIAGNOSIS — J9611 Chronic respiratory failure with hypoxia: Secondary | ICD-10-CM | POA: Diagnosis not present

## 2020-01-13 DIAGNOSIS — F411 Generalized anxiety disorder: Secondary | ICD-10-CM | POA: Diagnosis not present

## 2020-01-13 DIAGNOSIS — L03115 Cellulitis of right lower limb: Secondary | ICD-10-CM | POA: Diagnosis not present

## 2020-01-13 DIAGNOSIS — I5042 Chronic combined systolic (congestive) and diastolic (congestive) heart failure: Secondary | ICD-10-CM | POA: Diagnosis not present

## 2020-01-13 DIAGNOSIS — E039 Hypothyroidism, unspecified: Secondary | ICD-10-CM

## 2020-01-13 DIAGNOSIS — I10 Essential (primary) hypertension: Secondary | ICD-10-CM

## 2020-01-13 DIAGNOSIS — L03116 Cellulitis of left lower limb: Secondary | ICD-10-CM | POA: Diagnosis not present

## 2020-01-13 LAB — CBC
HCT: 36.1 % — ABNORMAL LOW (ref 39.0–52.0)
Hemoglobin: 12.3 g/dL — ABNORMAL LOW (ref 13.0–17.0)
MCH: 31.4 pg (ref 26.0–34.0)
MCHC: 34.1 g/dL (ref 30.0–36.0)
MCV: 92.1 fL (ref 80.0–100.0)
Platelets: 168 10*3/uL (ref 150–400)
RBC: 3.92 MIL/uL — ABNORMAL LOW (ref 4.22–5.81)
RDW: 13.6 % (ref 11.5–15.5)
WBC: 6.7 10*3/uL (ref 4.0–10.5)
nRBC: 0 % (ref 0.0–0.2)

## 2020-01-13 LAB — BASIC METABOLIC PANEL
Anion gap: 8 (ref 5–15)
BUN: 18 mg/dL (ref 8–23)
CO2: 30 mmol/L (ref 22–32)
Calcium: 8.3 mg/dL — ABNORMAL LOW (ref 8.9–10.3)
Chloride: 102 mmol/L (ref 98–111)
Creatinine, Ser: 0.99 mg/dL (ref 0.61–1.24)
GFR calc Af Amer: >60 mL/min (ref >60)
GFR calc non Af Amer: >60 mL/min (ref >60)
Glucose, Bld: 92 mg/dL (ref 70–99)
Potassium: 4 mmol/L (ref 3.5–5.1)
Sodium: 140 mmol/L (ref 135–145)

## 2020-01-13 NOTE — Plan of Care (Signed)
The patient has been stable. No falls. Uses 2L of oxygen chronic. No IV. [place] has been contacted as the patient has a legual guardian  Leia Alf was the designated person informed this weekend.  Problem: Education: Goal: Knowledge of General Education information will improve Description: Including pain rating scale, medication(s)/side effects and non-pharmacologic comfort measures Outcome: Completed/Met   Problem: Health Behavior/Discharge Planning: Goal: Ability to manage health-related needs will improve Outcome: Completed/Met   Problem: Clinical Measurements: Goal: Ability to maintain clinical measurements within normal limits will improve Outcome: Completed/Met Goal: Will remain free from infection Outcome: Completed/Met Goal: Diagnostic test results will improve Outcome: Completed/Met Goal: Respiratory complications will improve Outcome: Completed/Met Goal: Cardiovascular complication will be avoided Outcome: Completed/Met   Problem: Activity: Goal: Risk for activity intolerance will decrease Outcome: Completed/Met   Problem: Nutrition: Goal: Adequate nutrition will be maintained Outcome: Completed/Met   Problem: Coping: Goal: Level of anxiety will decrease Outcome: Completed/Met   Problem: Elimination: Goal: Will not experience complications related to bowel motility Outcome: Completed/Met Goal: Will not experience complications related to urinary retention Outcome: Completed/Met   Problem: Pain Managment: Goal: General experience of comfort will improve Outcome: Completed/Met   Problem: Safety: Goal: Ability to remain free from injury will improve Outcome: Completed/Met   Problem: Skin Integrity: Goal: Risk for impaired skin integrity will decrease Outcome: Completed/Met

## 2020-01-13 NOTE — Discharge Summary (Signed)
Physician Discharge Summary  Heman Que III YSA:[id number] DOB: May 01, 1946 DOA: 01/11/2020  PCP: Patient, No Pcp Per  Admit date: 01/11/2020 Discharge date: 01/13/2020  Time spent: 45 minutes  Recommendations for Outpatient Follow-up:  Patient will be discharged to skilled nursing facility.  Patient will need to follow up with primary care provider within one week of discharge.  Patient should continue medications as prescribed.  Patient should follow a heart healthy diet.   Discharge Diagnoses:  Bilateral lower extremity cellulitis/chronic venous stasis Combined systolic and diastolic heart failure Chronic hypoxic respiratory failure Essential hypertension History of PAF Hypothyroidism Dementia/delirium  Discharge Condition: Stable  Diet recommendation: heart healthy  Filed Weights   01/11/20 2356  Weight: 105.9 kg    History of present illness:  on 01/12/2020 by Dr. Lindajo Royal [name] IIIis a 74 y.o.malewith medical history significant forHTN, dementia, chronic combined heart failure, on chronic home O2, paroxysmal atrial fibrillation, hypothyroidism, hospitalized from 5/4 to 11/24/2019 with cellulitis right leg secondary to heel ulceration, returning to the emergency room on 12/04/2019 for aggressive behavior, staying until 4 days ago on 01/07/2020 when he was finally placed at [facility], who was sent back to the emergency room with concerns for cellulitis and sepsis. History is limited due to patient's dementia and is thus taken from review of ER records and past history  Hospital Course:  Bilateral lower extremity cellulitis/chronic venous stasis -Do not feel that this is truly cellulitis -Likely chronic venous stasis -Wound care consult -Keep lower extremities elbow -Patient was given vancomycin and Zosyn however not allowing for medications at this time -Leukocytosis, currently down to 6.7-and patient has not had any antibiotics for more than 24 hours as  he has been refusing this -Patient currently afebrile -As stated above do not feel that this is cellulitis given that he is afebrile with no leukocytosis.  Patient was recently admitted and treated for a similar presentation back in May 2021 with 2 weeks of doxycycline  Combined systolic and diastolic heart failure -Patient appears to be euvolemic at this point, continue home medications- lasix -Monitor intake and output, daily weight  Chronic hypoxic respiratory failure -Patient has 2 L oxygen listed on his medication list as needed -Possibly secondary to heart failure.  Patient has no history of COPD -Continue supplemental oxygen as needed  Essential hypertension -Continue Lasix  History of PAF -Currently in sinus rhythm -Not on anticoagulation likely secondary to advanced dementia  Hypothyroidism -Continue Synthroid  Dementia/delirium -Continue olanzapine, Zoloft, Valium -Patient is not allowing for all oral medications, will place him on IM olanzapine 2.5 mg every 8 hours as needed for agitation  Consultants none  Procedures  none   Discharge Exam: Vitals:   01/12/20 2114 01/13/20 0626  BP: 123/67 (!) 144/89  Pulse: 68 72  Resp:  20  Temp:  98.4 F (36.9 C)  SpO2:  98%     General: Well developed, well nourished, NAD, appears stated age  HEENT: NCAT, mucous membranes moist.  Cardiovascular: S1 S2 auscultated, RRR  Respiratory: Clear to auscultation bilaterally   Abdomen: Soft, nontender, nondistended, + bowel sounds  Extremities: warm dry without cyanosis clubbing.  Lower extremity edema with bilateral erythema.  Feet with overgrown toenails  Neuro: AAO x1 (self only), dementia, nonfocal  Discharge Instructions Discharge Instructions    Discharge wound care:   Complete by: As directed    Continue to keep Lower extremities clean and dry. May apply compression stockings for venous stasis.  Allergies as of 01/13/2020      Reactions    Demerol [meperidine] Other (See Comments)   Will counteract with a drug he is taking causing fatal reaction with nardil   Epinephrine Other (See Comments)   "Nardil reaction-With epi?" "Allergic," per MAR   Nardil [phenelzine] Other (See Comments)   "Allergic," per MAR   Morphine And Related Other (See Comments)   Reacts with nardil- "Allergic," per MAR   Ondansetron Other (See Comments)   Made patient want to climb the walls.   Shellfish Allergy Hives      Medication List    TAKE these medications   acetaminophen 325 MG tablet Commonly known as: TYLENOL Take 2 tablets (650 mg total) by mouth every 6 (six) hours as needed for mild pain (or Fever >/= 101). What changed: reasons to take this   albuterol 108 (90 Base) MCG/ACT inhaler Commonly known as: VENTOLIN HFA Inhale 2 puffs into the lungs every 6 (six) hours as needed for wheezing or shortness of breath. What changed: how much to take   Antacid 200-200-20 MG/5ML suspension Generic drug: alum & mag hydroxide-simeth Take 30 mLs by mouth 4 (four) times daily as needed for indigestion or heartburn.   ascorbic acid 500 MG tablet Commonly known as: VITAMIN C Take 500 mg by mouth 2 (two) times daily.   collagenase ointment Commonly known as: SANTYL Apply topically daily. Apply to right heel ulcer plus dry dressing change daily   diazepam 2 MG tablet Commonly known as: VALIUM Take 0.5-1 tablets (1-2 mg total) by mouth See admin instructions. Take 1 mg by mouth in the morning and 2 mg at bedtime   Diphenhist 25 MG tablet Generic drug: diphenhydrAMINE Take 25 mg by mouth every 6 (six) hours as needed (for allergic reactions).   eucerin cream Apply 1 application topically See admin instructions. Apply to both legs 2 times a day   furosemide 20 MG tablet Commonly known as: LASIX Take 2 tablets (40 mg total) by mouth daily.   guaiFENesin 100 MG/5ML liquid Commonly known as: ROBITUSSIN Take 300 mg by mouth every 6 (six)  hours as needed for cough.   levothyroxine 200 MCG tablet Commonly known as: SYNTHROID Take 1 tablet (200 mcg total) by mouth daily before breakfast.   liver oil-zinc oxide 40 % ointment Commonly known as: DESITIN Apply topically daily. What changed:   how much to take  when to take this  additional instructions   loperamide 2 MG tablet Commonly known as: IMODIUM A-D Take 4 mg by mouth every 3 (three) hours as needed for diarrhea or loose stools (CANNOT EXCEED 8 DOSES/24 HOURS).   metoCLOPramide 5 MG tablet Commonly known as: REGLAN Take 5 mg by mouth every 6 (six) hours as needed for nausea or vomiting.   Milk of Magnesia 400 MG/5ML suspension Generic drug: magnesium hydroxide Take 30 mLs by mouth daily as needed for mild constipation.   OLANZapine zydis 10 MG disintegrating tablet Commonly known as: ZYPREXA Take 1 tablet (10 mg total) by mouth 2 (two) times daily. What changed:   how much to take  when to take this   OXYGEN Inhale 2 L/min into the lungs See admin instructions. "2 L/min, as tolerated"   sertraline 25 MG tablet Commonly known as: ZOLOFT Take 1 tablet (25 mg total) by mouth daily.   Theratrum Complete Tabs Take 1 tablet by mouth daily with breakfast.            Discharge Care  Instructions  (From admission, onward)         Start     Ordered   01/13/20 0000  Discharge wound care:       Comments: Continue to keep Lower extremities clean and dry. May apply compression stockings for venous stasis.   01/13/20 0924         Allergies  Allergen Reactions  . Demerol [Meperidine] Other (See Comments)    Will counteract with a drug he is taking causing fatal reaction with nardil  . Epinephrine Other (See Comments)    "Nardil reaction-With epi?" "Allergic," per MAR  . Nardil [Phenelzine] Other (See Comments)    "Allergic," per MAR  . Morphine And Related Other (See Comments)    Reacts with nardil- "Allergic," per MAR  . Ondansetron Other  (See Comments)    Made patient want to climb the walls.  . Shellfish Allergy Hives      The results of significant diagnostics from this hospitalization (including imaging, microbiology, ancillary and laboratory) are listed below for reference.    Significant Diagnostic Studies: DG Chest 1 View  Result Date: 12/28/2019 CLINICAL DATA:  Shortness of breath. EXAM: CHEST  1 VIEW COMPARISON:  Radiograph 12/05/2019 FINDINGS: Similar cardiomegaly. Unchanged mediastinal contours with aortic tortuosity. Similar vascular congestion without pulmonary edema. Mild subsegmental bibasilar atelectasis/scarring. No focal airspace disease. No pneumothorax or pleural effusion. Degenerative change of the shoulders, right greater than left. IMPRESSION: Unchanged cardiomegaly and vascular congestion.  No acute findings. Electronically Signed   By: Narda Rutherford M.D.   On: 12/28/2019 23:08   US Venous Img Lower Bilateral  Result Date: 01/12/2020 CLINICAL DATA:  Leg pain and swelling, prolonged hospitalization EXAM: BILATERAL LOWER EXTREMITY VENOUS DOPPLER ULTRASOUND TECHNIQUE: Gray-scale sonography with compression, as well as color and duplex ultrasound, were performed to evaluate the deep venous system(s) from the level of the common femoral vein through the popliteal and proximal calf veins. COMPARISON:  Ultrasound 06/20/2019 FINDINGS: VENOUS Normal compressibility of the common femoral, superficial femoral, and popliteal veins. Slightly suboptimal visualization of calf veins bilaterally. Unable to assess compression though normal color flow is seen on longitudinal imaging. Visualized portions of profunda femoral vein and great saphenous vein unremarkable. No filling defects to suggest DVT on grayscale or color Doppler imaging. Doppler waveforms show normal direction of venous flow, normal respiratory plasticity and response to augmentation. OTHER None. Limitations: Suboptimal visualization of the bilateral calf  veins. IMPRESSION: No femoropopliteal DVT nor evidence of DVT within the visualized calf veins although compression assessment of the calf veins is limited. If clinical symptoms are inconsistent or if there are persistent or worsening symptoms, further imaging (possibly involving the iliac veins) may be warranted. Electronically Signed   By: Kreg Shropshire M.D.   On: 01/12/2020 01:25   DG Chest Portable 1 View  Result Date: 12/31/2019 CLINICAL DATA:  Shortness of breath today. EXAM: PORTABLE CHEST 1 VIEW COMPARISON:  Single view of the chest 11/17/2019. PA and lateral chest 06/07/2019. FINDINGS: The lungs are emphysematous. Right basilar airspace opacity is identified. The left lung is clear. Heart size is enlarged. No pneumothorax or pleural fluid. IMPRESSION: Right basilar airspace opacity could be atelectasis or pneumonia. Aortic Atherosclerosis (ICD10-I70.0) and Emphysema (ICD10-J43.9). Electronically Signed   By: Drusilla Kanner M.D.   On: 12/31/2019 11:15    Microbiology: Recent Results (from the past 240 hour(s))  Blood culture (routine x 2)     Status: None (Preliminary result)   Collection Time: 01/12/20  1:47 AM  Specimen: BLOOD  Result Value Ref Range Status   Specimen Description BLOOD LEFT ANTECUBITAL  Final   Special Requests   Final    BOTTLES DRAWN AEROBIC AND ANAEROBIC Blood Culture results may not be optimal due to an inadequate volume of blood received in culture bottles   Culture   Final    NO GROWTH 1 DAY Performed at [facility], [address], [place], [place] [address]    Report Status PENDING  Incomplete  Blood culture (routine x 2)     Status: None (Preliminary result)   Collection Time: 01/12/20  1:49 AM   Specimen: BLOOD  Result Value Ref Range Status   Specimen Description BLOOD RIGHT ANTECUBITAL  Final   Special Requests   Final    BOTTLES DRAWN AEROBIC AND ANAEROBIC Blood Culture results may not be optimal due to an inadequate volume of blood  received in culture bottles   Culture   Final    NO GROWTH 1 DAY Performed at [facility], [address] [place], [place] [address]    Report Status PENDING  Incomplete  SARS Coronavirus 2 by RT PCR (hospital order, performed in [place] hospital lab) Nasopharyngeal Nasopharyngeal Swab     Status: None   Collection Time: 01/12/20  2:53 AM   Specimen: Nasopharyngeal Swab  Result Value Ref Range Status   SARS Coronavirus 2 NEGATIVE NEGATIVE Final    Comment: (NOTE) SARS-CoV-2 target nucleic acids are NOT DETECTED.  The SARS-CoV-2 RNA is generally detectable in upper and lower respiratory specimens during the acute phase of infection. The lowest concentration of SARS-CoV-2 viral copies this assay can detect is 250 copies / mL. A negative result does not preclude SARS-CoV-2 infection and should not be used as the sole basis for treatment or other patient management decisions.  A negative result may occur with improper specimen collection / handling, submission of specimen other than nasopharyngeal swab, presence of viral mutation(s) within the areas targeted by this assay, and inadequate number of viral copies (<250 copies / mL). A negative result must be combined with clinical observations, patient history, and epidemiological information.  Fact Sheet for Patients:   StrictlyIdeas.no  Fact Sheet for Healthcare Providers: BankingDealers.co.za  This test is not yet approved or  cleared by the [place] FDA and has been authorized for detection and/or diagnosis of SARS-CoV-2 by FDA under an Emergency Use Authorization (EUA).  This EUA will remain in effect (meaning this test can be used) for the duration of the COVID-19 declaration under Section 564(b)(1) of the Act, 21 U.S.C. section 360bbb-3(b)(1), unless the authorization is terminated or revoked sooner.  Performed at [facility], [address]  [place], [place] [address]      Labs: Basic Metabolic Panel: Recent Labs  Lab 01/12/20 0150 01/13/20 0636  NA 138 140  K 4.5 4.0  CL 102 102  CO2 31 30  GLUCOSE 113* 92  BUN 20 18  CREATININE 1.00 0.99  CALCIUM 8.1* 8.3*   Liver Function Tests: Recent Labs  Lab 01/12/20 0150  AST 17  ALT 15  ALKPHOS 67  BILITOT 1.1  PROT 6.6  ALBUMIN 3.4*   No results for input(s): LIPASE, AMYLASE in the last 168 hours. No results for input(s): AMMONIA in the last 168 hours. CBC: Recent Labs  Lab 01/12/20 0000 01/12/20 2238 01/13/20 0636  WBC 12.5* 9.8 6.7  NEUTROABS 10.6*  --   --   HGB 13.5 11.7* 12.3*  HCT  40.7 35.1* 36.1*  MCV 95.3 93.9 92.1  PLT 189 176 168   Cardiac Enzymes: No results for input(s): CKTOTAL, CKMB, CKMBINDEX, TROPONINI in the last 168 hours. BNP: BNP (last 3 results) Recent Labs    01/31/19 2213 06/20/19 1500 12/04/19 2051  BNP 34.0 21.0 23.5    ProBNP (last 3 results) No results for input(s): PROBNP in the last 8760 hours.  CBG: No results for input(s): GLUCAP in the last 168 hours.     Signed:  Edsel PetrinMaryann Evangeline Utley  [facility] 01/13/2020, 9:24 AM

## 2020-01-17 LAB — CULTURE, BLOOD (ROUTINE X 2)
Culture: NO GROWTH
Culture: NO GROWTH

## 2020-01-18 ENCOUNTER — Encounter: Payer: Self-pay | Admitting: Emergency Medicine

## 2020-01-18 ENCOUNTER — Emergency Department: Payer: Medicare Other

## 2020-01-18 ENCOUNTER — Other Ambulatory Visit: Payer: Self-pay

## 2020-01-18 ENCOUNTER — Emergency Department
Admission: EM | Admit: 2020-01-18 | Discharge: 2020-01-18 | Disposition: A | Discharge status: Skilled Nursing Facility | Payer: Medicare Other | Attending: Emergency Medicine | Admitting: Emergency Medicine

## 2020-01-18 DIAGNOSIS — I110 Hypertensive heart disease with heart failure: Secondary | ICD-10-CM | POA: Insufficient documentation

## 2020-01-18 DIAGNOSIS — I5042 Chronic combined systolic (congestive) and diastolic (congestive) heart failure: Secondary | ICD-10-CM | POA: Insufficient documentation

## 2020-01-18 DIAGNOSIS — E039 Hypothyroidism, unspecified: Secondary | ICD-10-CM | POA: Diagnosis not present

## 2020-01-18 DIAGNOSIS — Z87891 Personal history of nicotine dependence: Secondary | ICD-10-CM | POA: Insufficient documentation

## 2020-01-18 DIAGNOSIS — R4182 Altered mental status, unspecified: Secondary | ICD-10-CM | POA: Diagnosis present

## 2020-01-18 LAB — COMPREHENSIVE METABOLIC PANEL
ALT: 14 U/L (ref 0–44)
AST: 19 U/L (ref 15–41)
Albumin: 3.6 g/dL (ref 3.5–5.0)
Alkaline Phosphatase: 66 U/L (ref 38–126)
Anion gap: 12 (ref 5–15)
BUN: 22 mg/dL (ref 8–23)
CO2: 27 mmol/L (ref 22–32)
Calcium: 8.2 mg/dL — ABNORMAL LOW (ref 8.9–10.3)
Chloride: 101 mmol/L (ref 98–111)
Creatinine, Ser: 1.1 mg/dL (ref 0.61–1.24)
GFR calc Af Amer: >60 mL/min (ref >60)
GFR calc non Af Amer: >60 mL/min (ref >60)
Glucose, Bld: 129 mg/dL — ABNORMAL HIGH (ref 70–99)
Potassium: 4.3 mmol/L (ref 3.5–5.1)
Sodium: 140 mmol/L (ref 135–145)
Total Bilirubin: 1 mg/dL (ref 0.3–1.2)
Total Protein: 7 g/dL (ref 6.5–8.1)

## 2020-01-18 LAB — CBC WITH DIFFERENTIAL/PLATELET
Abs Immature Granulocytes: 0.1 10*3/uL — ABNORMAL HIGH (ref 0.00–0.07)
Basophils Absolute: 0 10*3/uL (ref 0.0–0.1)
Basophils Relative: 0 %
Eosinophils Absolute: 0.3 10*3/uL (ref 0.0–0.5)
Eosinophils Relative: 2 %
HCT: 39.3 % (ref 39.0–52.0)
Hemoglobin: 13.1 g/dL (ref 13.0–17.0)
Immature Granulocytes: 1 %
Lymphocytes Relative: 3 %
Lymphs Abs: 0.4 10*3/uL — ABNORMAL LOW (ref 0.7–4.0)
MCH: 31.5 pg (ref 26.0–34.0)
MCHC: 33.3 g/dL (ref 30.0–36.0)
MCV: 94.5 fL (ref 80.0–100.0)
Monocytes Absolute: 1.2 10*3/uL — ABNORMAL HIGH (ref 0.1–1.0)
Monocytes Relative: 9 %
Neutro Abs: 11.6 10*3/uL — ABNORMAL HIGH (ref 1.7–7.7)
Neutrophils Relative %: 85 %
Platelets: 210 10*3/uL (ref 150–400)
RBC: 4.16 MIL/uL — ABNORMAL LOW (ref 4.22–5.81)
RDW: 13.7 % (ref 11.5–15.5)
WBC: 13.6 10*3/uL — ABNORMAL HIGH (ref 4.0–10.5)
nRBC: 0 % (ref 0.0–0.2)

## 2020-01-18 LAB — URINALYSIS, COMPLETE (UACMP) WITH MICROSCOPIC
Bacteria, UA: NONE SEEN
Bilirubin Urine: NEGATIVE
Glucose, UA: NEGATIVE mg/dL
Hgb urine dipstick: NEGATIVE
Ketones, ur: NEGATIVE mg/dL
Leukocytes,Ua: NEGATIVE
Nitrite: NEGATIVE
Protein, ur: NEGATIVE mg/dL
Specific Gravity, Urine: 1.015 (ref 1.005–1.030)
Squamous Epithelial / HPF: NONE SEEN (ref 0–5)
pH: 5 (ref 5.0–8.0)

## 2020-01-18 NOTE — ED Provider Notes (Signed)
[facility] Emergency Department Provider Note  ____________________________________________   I have reviewed the triage vital signs and the nursing notes.   HISTORY  Chief Complaint Altered Mental Status   History limited by: Not Limited   HPI Eric [name] is a 74 y.o. male who presents to the emergency department today because of concern for ams. Apparently he was last seen normal at 730 am at his living facility. Per report he seemed altered to staff and was leaning to his right. The patient himself does not know why he was brought to the hospital.  The patient denies any headache.  Denies any chest pain or shortness of breath.   Records reviewed. Per medical record review patient has a history of dementia, HTN.   Past Medical History:  Diagnosis Date  . Acute respiratory failure with hypoxia (HCC)    multiple prior admissions to [facility] and [facility] for CHF exacerbation / respiratory failure  . Anxiety and depression   . Atrial fibrillation (HCC) 07/25/2018   new-onset during [facility] admission in Jan 2020  . Cataract   . CHF (congestive heart failure) (HCC)    EF <= 50% as of Jan 2020  . Current use of long term anticoagulation 07/2018   Started on apixaban for new-onset a-fib in Jan 2020  . Dementia (HCC)    possible, likely age-related, documented decreased cognitive function on prior hospitalizations  . Gallstones   . Glaucoma   . Hypertension   . Hypoglycemia   . Hypothyroidism   . Inguinal hernia   . Sleep apnea   . Thyroid disease     Patient Active Problem List   Diagnosis Date Noted  . Bilateral lower leg cellulitis 01/12/2020  . Venous stasis ulcer of both lower extremities without varicose veins (HCC) 01/12/2020  . Chronic respiratory failure with hypoxia (HCC) 01/12/2020  . Cellulitis of right lower extremity   . Severe protein-calorie malnutrition (HCC)   . Right foot ulcer (HCC) 11/16/2019  . Anxiety and depression   .  Cellulitis of right foot   . 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 Chronic combined systolic and diastolic CHF (congestive heart failure) (HCC) 11/16/2018  . Anasarca 11/16/2018  . Hypothyroidism 11/16/2018  . PAF (paroxysmal atrial fibrillation) (HCC) 11/16/2018  . HTN (hypertension) 11/16/2018  . Generalized anxiety disorder 11/03/2018  . Major depression in partial remission (HCC) 11/03/2018  . Acute hypoxemic respiratory failure (HCC) 10/29/2018  . Suspected COVID-19 virus infection 10/29/2018    Past Surgical History:  Procedure Laterality Date  . FINGER AMPUTATION Right     Prior to Admission medications   Medication Sig Start Date End Date Taking? Authorizing Provider  acetaminophen (TYLENOL) 325 MG tablet Take 2 tablets (650 mg total) by mouth every 6 (six) hours as needed for mild pain (or Fever >/= 101). Patient taking differently: Take 650 mg by mouth every 6 (six) hours as needed for mild pain or headache (fever >/= 101, or minor discomfort).  11/03/18   Alford Highland, MD  albuterol (VENTOLIN HFA) 108 (90 Base) MCG/ACT inhaler Inhale 2 puffs into the lungs every 6 (six) hours as needed for wheezing or shortness of breath. Patient taking differently: Inhale 1 puff into the lungs every 6 (six) hours as needed for wheezing or shortness of breath.  11/21/19   Ghimire, Werner Lean, MD  alum & mag hydroxide-simeth (ANTACID) 200-200-20 MG/5ML suspension Take 30 mLs by mouth 4 (four) times daily as needed  for indigestion or heartburn.    [provider]  ascorbic acid (VITAMIN C) 500 MG tablet Take 500 mg by mouth 2 (two) times daily.    [provider]  collagenase (SANTYL) ointment Apply topically daily. Apply to right heel ulcer plus dry dressing change daily 11/22/19   Ghimire, Werner Lean, MD  diazepam (VALIUM) 2 MG tablet Take 0.5-1 tablets (1-2 mg total) by mouth See admin instructions. Take 1 mg by mouth in the morning and  2 mg at bedtime 11/21/19   Ghimire, Werner Lean, MD  diphenhydrAMINE (DIPHENHIST) 25 MG tablet Take 25 mg by mouth every 6 (six) hours as needed (for allergic reactions).    [provider]  furosemide (LASIX) 20 MG tablet Take 2 tablets (40 mg total) by mouth daily. 11/21/19   Ghimire, Werner Lean, MD  guaiFENesin (ROBITUSSIN) 100 MG/5ML liquid Take 300 mg by mouth every 6 (six) hours as needed for cough.    [provider]  levothyroxine (SYNTHROID) 200 MCG tablet Take 1 tablet (200 mcg total) by mouth daily before breakfast. 11/21/19   Ghimire, Werner Lean, MD  liver oil-zinc oxide (DESITIN) 40 % ointment Apply topically daily. Patient taking differently: Apply 1 application topically See admin instructions. Apply to affected area once daily 11/04/18   Alford Highland, MD  loperamide (IMODIUM A-D) 2 MG tablet Take 4 mg by mouth every 3 (three) hours as needed for diarrhea or loose stools (CANNOT EXCEED 8 DOSES/24 HOURS).    [provider]  magnesium hydroxide (MILK OF MAGNESIA) 400 MG/5ML suspension Take 30 mLs by mouth daily as needed for mild constipation.    [provider]  metoCLOPramide (REGLAN) 5 MG tablet Take 5 mg by mouth every 6 (six) hours as needed for nausea or vomiting.    [provider]  Multiple Vitamins-Minerals (THERATRUM COMPLETE) TABS Take 1 tablet by mouth daily with breakfast.    [provider]  OLANZapine zydis (ZYPREXA) 10 MG disintegrating tablet Take 1 tablet (10 mg total) by mouth 2 (two) times daily. Patient taking differently: Take 15 mg by mouth at bedtime.  11/03/18   Alford Highland, MD  OXYGEN Inhale 2 L/min into the lungs See admin instructions. "2 L/min, as tolerated"    [provider]  sertraline (ZOLOFT) 25 MG tablet Take 1 tablet (25 mg total) by mouth daily. 06/10/19   Charm Rings, NP  Skin Protectants, Misc. (EUCERIN) cream Apply 1 application topically See admin instructions. Apply to both legs 2  times a day    [provider]    Allergies Demerol [meperidine], Epinephrine, Nardil [phenelzine], Morphine and related, Ondansetron, and Shellfish allergy  Family History  Problem Relation Age of Onset  . Alzheimer's disease Mother     Social History Social History   Tobacco Use  . Smoking status: Former Smoker    Quit date: 10/18/2010    Years since quitting: 9.2  . Smokeless tobacco: Never Used  Substance Use Topics  . Alcohol use: No  . Drug use: No    Review of Systems Constitutional: No fever/chills Eyes: No visual changes. ENT: No sore throat. Cardiovascular: Denies chest pain. Respiratory: Denies shortness of breath. Gastrointestinal: No abdominal pain.  No nausea, no vomiting.  No diarrhea.   Genitourinary: Negative for dysuria. Musculoskeletal: Negative for back pain. Skin: Negative for rash. Neurological: Negative for headaches, focal weakness or numbness.  ____________________________________________   PHYSICAL EXAM:  VITAL SIGNS: ED Triage Vitals  Enc Vitals Group  BP 01/18/20 1430 (!) 138/99     Pulse Rate 01/18/20 1430 67     Resp 01/18/20 1430 18     Temp 01/18/20 1430 98.2 F (36.8 C)     Temp Source 01/18/20 1430 Oral     SpO2 01/18/20 1430 96 %     Weight 01/18/20 1431 231 lb 7.7 oz (105 kg)     Height 01/18/20 1431 6' (1.829 m)     Head Circumference --      Peak Flow --      Pain Score 01/18/20 1431 0   Constitutional: Alert and oriented.  Eyes: Conjunctivae are normal.  ENT      Head: Normocephalic and atraumatic.      Nose: No congestion/rhinnorhea.      Mouth/Throat: Mucous membranes are moist.      Neck: No stridor. Hematological/Lymphatic/Immunilogical: No cervical lymphadenopathy. Cardiovascular: Normal rate, regular rhythm.  No murmurs, rubs, or gallops.  Respiratory: Normal respiratory effort without tachypnea nor retractions. Breath sounds are clear and equal bilaterally. No  wheezes/rales/rhonchi. Gastrointestinal: Soft and non tender. No rebound. No guarding.  Genitourinary: Deferred Musculoskeletal: Normal range of motion in all extremities. No lower extremity edema. Neurologic:  Normal speech and language. No gross focal neurologic deficits are appreciated.  Skin:  Skin is warm, dry and intact. No rash noted. Psychiatric: Mood and affect are normal. Speech and behavior are normal. Patient exhibits appropriate insight and judgment.  ____________________________________________    LABS (pertinent positives/negatives)  CMP wnl except glu 129, ca 8.2 CBC wbc 13.6, hgb 13.1, plt 210 UA clear, unremarkable ____________________________________________   EKG  None  ____________________________________________    RADIOLOGY  CT head  No acute intracranial abnormality  ____________________________________________   PROCEDURES  Procedures  ____________________________________________   INITIAL IMPRESSION / ASSESSMENT AND PLAN / ED COURSE  Pertinent labs & imaging results that were available during my care of the patient were reviewed by me and considered in my medical decision making (see chart for details).   Patient presented from living facility because of concerns for altered mental status and right gaze.  On exam patient is awake and alert here.  He has no complaints himself.  Neuro exam does not show any facial asymmetry, extraocular motions are intact.  Patient strength is symmetric.  Did obtain a head CT which was negative for any acute process.  Blood work and urine without concerning findings.  Patient is well-known to myself having spent many weeks here in the emergency department recently.  He does appear to be at his baseline.  Will plan on discharging back to his nursing facility. ____________________________________________   FINAL CLINICAL IMPRESSION(S) / ED DIAGNOSES  Final diagnoses:  Altered mental status, unspecified altered  mental status type     Note: This dictation was prepared with Dragon dictation. Any transcriptional errors that result from this process are unintentional     Phineas Semen, MD 01/18/20 [phone]

## 2020-01-18 NOTE — ED Triage Notes (Signed)
Pt arrival via [facility] from the [facility] [place] due to what staff says is altered mental status. The [facility] staff states that patient had his last known well at 7:30 am this morning but that when they went in his room this afternoon he was leaning heavily to the right and was altered. With EMS, patient was a&ox4, when he pivoted from his wheelchair to the stetcher he was dead weight but patient is wheelchair dependent.   Pt denies any complaints. EMS states that patient has a strong smell of urine. Stroke screen neg with EMS.   VS with EMS: CBG 137 BP 147/78 Temp 98.1 HR 67 96% on @ 2 L which is baseline o2 at home

## 2020-01-18 NOTE — ED Notes (Signed)
Pt cleansed of incontinent urine.  New brief applied Repositioned Snacks given

## 2020-01-18 NOTE — ED Notes (Signed)
Attempted to get urine sample. PT is incontinent and this RN did not feel comfortable cathing pt as foreskin is unable to be pulled back. PT penis cleansed and condom cath applied.

## 2020-01-18 NOTE — Discharge Instructions (Addendum)
Please seek medical attention for any high fevers, chest pain, shortness of breath, change in behavior, persistent vomiting, bloody stool or any other new or concerning symptoms.  

## 2020-01-18 NOTE — ED Notes (Signed)
No urine output yet. 

## 2020-01-18 NOTE — ED Notes (Signed)
2 attempts to call legal guardian, Carly, with no success.

## 2020-02-04 ENCOUNTER — Encounter: Payer: Self-pay | Admitting: Emergency Medicine

## 2020-02-04 ENCOUNTER — Emergency Department: Payer: Medicare Other

## 2020-02-04 ENCOUNTER — Other Ambulatory Visit: Payer: Self-pay

## 2020-02-04 ENCOUNTER — Emergency Department
Admission: EM | Admit: 2020-02-04 | Discharge: 2020-02-04 | Disposition: A | Discharge status: Skilled Nursing Facility | Payer: Medicare Other | Attending: Emergency Medicine | Admitting: Emergency Medicine

## 2020-02-04 DIAGNOSIS — R0902 Hypoxemia: Secondary | ICD-10-CM | POA: Diagnosis not present

## 2020-02-04 DIAGNOSIS — E039 Hypothyroidism, unspecified: Secondary | ICD-10-CM | POA: Insufficient documentation

## 2020-02-04 DIAGNOSIS — I110 Hypertensive heart disease with heart failure: Secondary | ICD-10-CM | POA: Insufficient documentation

## 2020-02-04 DIAGNOSIS — R0600 Dyspnea, unspecified: Secondary | ICD-10-CM | POA: Diagnosis not present

## 2020-02-04 DIAGNOSIS — F0390 Unspecified dementia without behavioral disturbance: Secondary | ICD-10-CM | POA: Diagnosis not present

## 2020-02-04 DIAGNOSIS — I509 Heart failure, unspecified: Secondary | ICD-10-CM | POA: Diagnosis not present

## 2020-02-04 DIAGNOSIS — J441 Chronic obstructive pulmonary disease with (acute) exacerbation: Secondary | ICD-10-CM

## 2020-02-04 DIAGNOSIS — J80 Acute respiratory distress syndrome: Secondary | ICD-10-CM | POA: Diagnosis present

## 2020-02-04 LAB — BASIC METABOLIC PANEL
Anion gap: 7 (ref 5–15)
BUN: 14 mg/dL (ref 8–23)
CO2: 28 mmol/L (ref 22–32)
Calcium: 7.8 mg/dL — ABNORMAL LOW (ref 8.9–10.3)
Chloride: 104 mmol/L (ref 98–111)
Creatinine, Ser: 0.77 mg/dL (ref 0.61–1.24)
GFR calc Af Amer: >60 mL/min (ref >60)
GFR calc non Af Amer: >60 mL/min (ref >60)
Glucose, Bld: 120 mg/dL — ABNORMAL HIGH (ref 70–99)
Potassium: 3.9 mmol/L (ref 3.5–5.1)
Sodium: 139 mmol/L (ref 135–145)

## 2020-02-04 LAB — CBC WITH DIFFERENTIAL/PLATELET
Abs Immature Granulocytes: 0.07 10*3/uL (ref 0.00–0.07)
Basophils Absolute: 0 10*3/uL (ref 0.0–0.1)
Basophils Relative: 0 %
Eosinophils Absolute: 0.2 10*3/uL (ref 0.0–0.5)
Eosinophils Relative: 2 %
HCT: 37.2 % — ABNORMAL LOW (ref 39.0–52.0)
Hemoglobin: 12.7 g/dL — ABNORMAL LOW (ref 13.0–17.0)
Immature Granulocytes: 1 %
Lymphocytes Relative: 5 %
Lymphs Abs: 0.5 10*3/uL — ABNORMAL LOW (ref 0.7–4.0)
MCH: 31.3 pg (ref 26.0–34.0)
MCHC: 34.1 g/dL (ref 30.0–36.0)
MCV: 91.6 fL (ref 80.0–100.0)
Monocytes Absolute: 0.7 10*3/uL (ref 0.1–1.0)
Monocytes Relative: 6 %
Neutro Abs: 10.1 10*3/uL — ABNORMAL HIGH (ref 1.7–7.7)
Neutrophils Relative %: 86 %
Platelets: 201 10*3/uL (ref 150–400)
RBC: 4.06 MIL/uL — ABNORMAL LOW (ref 4.22–5.81)
RDW: 13.2 % (ref 11.5–15.5)
WBC: 11.6 10*3/uL — ABNORMAL HIGH (ref 4.0–10.5)
nRBC: 0 % (ref 0.0–0.2)

## 2020-02-04 LAB — BLOOD GAS, VENOUS
Acid-Base Excess: 8.1 mmol/L — ABNORMAL HIGH (ref 0.0–2.0)
Bicarbonate: 33.4 mmol/L — ABNORMAL HIGH (ref 20.0–28.0)
O2 Saturation: 93.1 %
Patient temperature: 37
pCO2, Ven: 48 mmHg (ref 44.0–60.0)
pH, Ven: 7.45 — ABNORMAL HIGH (ref 7.250–7.430)
pO2, Ven: 64 mmHg — ABNORMAL HIGH (ref 32.0–45.0)

## 2020-02-04 LAB — TROPONIN I (HIGH SENSITIVITY): Troponin I (High Sensitivity): 7 ng/L (ref <18)

## 2020-02-04 LAB — BRAIN NATRIURETIC PEPTIDE: B Natriuretic Peptide: 178.8 pg/mL — ABNORMAL HIGH (ref 0.0–100.0)

## 2020-02-04 MED ORDER — LORAZEPAM 2 MG/ML IJ SOLN
0.5000 mg | Freq: Once | INTRAMUSCULAR | Status: DC
  Filled 2020-02-04: qty 1

## 2020-02-04 MED ORDER — METHYLPREDNISOLONE SODIUM SUCC 125 MG IJ SOLR
125.0000 mg | Freq: Once | INTRAMUSCULAR | Status: AC
  Administered 2020-02-04: 125 mg via INTRAVENOUS
  Filled 2020-02-04: qty 2

## 2020-02-04 MED ORDER — IPRATROPIUM-ALBUTEROL 0.5-2.5 (3) MG/3ML IN SOLN
3.0000 mL | Freq: Once | RESPIRATORY_TRACT | Status: AC
  Administered 2020-02-04: 3 mL via RESPIRATORY_TRACT
  Filled 2020-02-04: qty 3

## 2020-02-04 NOTE — ED Notes (Signed)
Pt discharged via ems to [facility]. NAD noted.

## 2020-02-04 NOTE — ED Provider Notes (Addendum)
ER Provider Note       Time seen: 8:40 AM    I have reviewed the vital signs and the nursing notes.  HISTORY   Chief Complaint No chief complaint on file. Level V caveat: History/ROS limited by altered mental status   HPI Eric [name] is a 74 y.o. male with a history of, gallstones, hypertension, hypothyroidism, sleep apnea who presents today for respiratory distress.  Patient comes from the [place] where he was noted to be hypoxic and having difficulty breathing.  His oxygen tubing was not connected well, reportedly he is on 3 L nasal cannula all the time.  Patient is agitated on arrival on a nonrebreather.  Past Medical History:  Diagnosis Date  . Acute respiratory failure with hypoxia (HCC)    multiple prior admissions to [facility] and [facility] for CHF exacerbation / respiratory failure  . Anxiety and depression   . Atrial fibrillation (HCC) 07/25/2018   new-onset during [facility] admission in Jan 2020  . Cataract   . CHF (congestive heart failure) (HCC)    EF <= 50% as of Jan 2020  . Current use of long term anticoagulation 07/2018   Started on apixaban for new-onset a-fib in Jan 2020  . Dementia (HCC)    possible, likely age-related, documented decreased cognitive function on prior hospitalizations  . Gallstones   . Glaucoma   . Hypertension   . Hypoglycemia   . Hypothyroidism   . Inguinal hernia   . Sleep apnea   . Thyroid disease     Past Surgical History:  Procedure Laterality Date  . FINGER AMPUTATION Right     Allergies Demerol [meperidine], Epinephrine, Nardil [phenelzine], Morphine and related, Ondansetron, and Shellfish allergy  Review of Systems Patient with dementia, obvious shortness of breath otherwise unknown  All systems negative/normal/unremarkable except as stated in the HPI  ____________________________________________   PHYSICAL EXAM:  VITAL SIGNS: There were no vitals filed for this visit.  Constitutional: Alert and mildly agitated, moderate  distress, unkempt appearance Eyes: Conjunctivae are normal. Normal extraocular movements. ENT      Head: Normocephalic and atraumatic.      Nose: No congestion/rhinnorhea.      Mouth/Throat: Mucous membranes are moist.      Neck: No stridor. Cardiovascular: Rapid rate, regular rhythm. No murmurs, rubs, or gallops. Respiratory: Tachypnea with wheezing bilaterally Gastrointestinal: Soft and nontender. Normal bowel sounds Musculoskeletal: Nontender with normal range of motion in extremities.  Bilateral lower extremity edema Neurologic:  Normal speech and language. No gross focal neurologic deficits are appreciated.  Skin:  Skin is warm, dry and intact. No rash noted. Psychiatric: Anxious mood and affect ____________________________________________  EKG: Interpreted by me.  Sinus tachycardia with rate of 103 bpm, old anterior infarct, normal axis, normal QT  ____________________________________________   LABS (pertinent positives/negatives)  Labs Reviewed  CBC WITH DIFFERENTIAL/PLATELET - Abnormal; Notable for the following components:      Result Value   WBC 11.6 (*)    RBC 4.06 (*)    Hemoglobin 12.7 (*)    HCT 37.2 (*)    Neutro Abs 10.1 (*)    Lymphs Abs 0.5 (*)    All other components within normal limits  BASIC METABOLIC PANEL - Abnormal; Notable for the following components:   Glucose, Bld 120 (*)    Calcium 7.8 (*)    All other components within normal limits  BLOOD GAS, VENOUS - Abnormal; Notable for the following components:   pH, Ven 7.45 (*)  pO2, Ven 64.0 (*)    Bicarbonate 33.4 (*)    Acid-Base Excess 8.1 (*)    All other components within normal limits  SARS CORONAVIRUS 2 BY RT PCR (HOSPITAL ORDER, PERFORMED IN [facility])  BRAIN NATRIURETIC PEPTIDE  TROPONIN I (HIGH SENSITIVITY)   CRITICAL CARE Performed by: Ulice Dash   Total critical care time: 30 minutes  Critical care time was exclusive of separately billable procedures and  treating other patients.  Critical care was necessary to treat or prevent imminent or life-threatening deterioration.  Critical care was time spent personally by me on the following activities: development of treatment plan with patient and/or surrogate as well as nursing, discussions with consultants, evaluation of patient's response to treatment, examination of patient, obtaining history from patient or surrogate, ordering and performing treatments and interventions, ordering and review of laboratory studies, ordering and review of radiographic studies, pulse oximetry and re-evaluation of patient's condition.  RADIOLOGY  Images were viewed by me Chest x-ray IMPRESSION: Cardiomegaly without acute abnormality of the lungs in AP portable projection.  DIFFERENTIAL DIAGNOSIS  CHF, COPD, pneumonia, COVID-19, renal failure, dehydration, dementia  ASSESSMENT AND PLAN  Dyspnea, hypoxia, COPD exacerbation   Plan: The patient had presented for dyspnea and hypoxia. Patient's labs were grossly unremarkable.  Patient's dyspnea is seem to be coming from his lack of oxygen and issues with his oxygen tubing prior to arrival.  He does have baseline COPD and had mild wheezing which was treated with duo nebs and steroids.  Dyspnea was exacerbated by his dementia and resulting anxiety leading to panic and respiratory distress.  All of these events appear to have resolved.  When calm his oxygen saturations have been normal here on 3 L which is what he is on chronically.  His labs have been at his baseline and x-ray is unremarkable.  Daryel November MD    Note: This note was generated in part or whole with voice recognition software. Voice recognition is usually quite accurate but there are transcription errors that can and very often do occur. I apologize for any typographical errors that were not detected and corrected.     Emily Filbert, MD 02/04/20 [id number]    Emily Filbert,  MD 02/04/20 [phone]

## 2020-02-04 NOTE — ED Triage Notes (Signed)
Pt via ems from the [facility] at [facility] with respiratory distress. Ems called because pt had "no energy due to low oxygen levels." ems found 2 knots in o2 tubing. When they arrived, O2 sats in 70's. They put him on nrb and saturation raised to 92%. Pt hx dementia, yelling "please let me go" during triage/assessment.

## 2020-02-19 ENCOUNTER — Encounter: Payer: Self-pay | Admitting: Emergency Medicine

## 2020-02-19 ENCOUNTER — Emergency Department
Admission: EM | Admit: 2020-02-19 | Discharge: 2020-02-20 | Disposition: A | Discharge status: Home / Self Care | Payer: Medicare Other | Attending: Emergency Medicine | Admitting: Emergency Medicine

## 2020-02-19 DIAGNOSIS — L089 Local infection of the skin and subcutaneous tissue, unspecified: Secondary | ICD-10-CM | POA: Diagnosis present

## 2020-02-19 DIAGNOSIS — I110 Hypertensive heart disease with heart failure: Secondary | ICD-10-CM | POA: Insufficient documentation

## 2020-02-19 DIAGNOSIS — L03113 Cellulitis of right upper limb: Secondary | ICD-10-CM | POA: Diagnosis not present

## 2020-02-19 DIAGNOSIS — Z7951 Long term (current) use of inhaled steroids: Secondary | ICD-10-CM | POA: Diagnosis not present

## 2020-02-19 DIAGNOSIS — E039 Hypothyroidism, unspecified: Secondary | ICD-10-CM | POA: Diagnosis not present

## 2020-02-19 DIAGNOSIS — Z87891 Personal history of nicotine dependence: Secondary | ICD-10-CM | POA: Insufficient documentation

## 2020-02-19 DIAGNOSIS — L02413 Cutaneous abscess of right upper limb: Secondary | ICD-10-CM | POA: Insufficient documentation

## 2020-02-19 DIAGNOSIS — B9689 Other specified bacterial agents as the cause of diseases classified elsewhere: Secondary | ICD-10-CM | POA: Insufficient documentation

## 2020-02-19 DIAGNOSIS — L0291 Cutaneous abscess, unspecified: Secondary | ICD-10-CM

## 2020-02-19 DIAGNOSIS — I5042 Chronic combined systolic (congestive) and diastolic (congestive) heart failure: Secondary | ICD-10-CM | POA: Insufficient documentation

## 2020-02-19 DIAGNOSIS — F0390 Unspecified dementia without behavioral disturbance: Secondary | ICD-10-CM | POA: Diagnosis not present

## 2020-02-19 MED ORDER — SULFAMETHOXAZOLE-TRIMETHOPRIM 800-160 MG PO TABS: 1.0000 | ORAL_TABLET | Freq: Two times a day (BID) | ORAL | 0 refills | Status: DC

## 2020-02-19 MED ORDER — CEPHALEXIN 500 MG PO CAPS: 500.0000 mg | ORAL_CAPSULE | Freq: Two times a day (BID) | ORAL | 0 refills | Status: DC

## 2020-02-19 MED ORDER — LIDOCAINE-EPINEPHRINE-TETRACAINE (LET) TOPICAL GEL
3.0000 mL | Freq: Once | TOPICAL | Status: AC
  Administered 2020-02-19: 3 mL via TOPICAL
  Filled 2020-02-19: qty 3

## 2020-02-19 MED ORDER — SULFAMETHOXAZOLE-TRIMETHOPRIM 800-160 MG PO TABS
1.0000 | ORAL_TABLET | Freq: Once | ORAL | Status: AC
  Administered 2020-02-20: 1 via ORAL
  Filled 2020-02-19: qty 1

## 2020-02-19 MED ORDER — CEPHALEXIN 500 MG PO CAPS
500.0000 mg | ORAL_CAPSULE | Freq: Once | ORAL | Status: AC
  Administered 2020-02-20: 500 mg via ORAL
  Filled 2020-02-19: qty 1

## 2020-02-19 NOTE — Discharge Instructions (Signed)
Abscess was lanced and drained, antibiotics were given in the ED and prescriptions provided

## 2020-02-19 NOTE — ED Provider Notes (Signed)
[facility] Emergency Department Provider Note   ____________________________________________    I have reviewed the triage vital signs and the nursing notes.   HISTORY  Chief Complaint Wound Infection  History limited by dementia   HPI Eric [name] is a 74 y.o. male with a history of CHF, dementia who presents for reported wound infection.  Patient notes area of swelling on his right forearm.  Unclear whether there was a wound there before.  Denies fevers or chills.  Ports overall he feels well and has no complaints.  Past Medical History:  Diagnosis Date  . Acute respiratory failure with hypoxia (HCC)    multiple prior admissions to [facility] and [facility] for CHF exacerbation / respiratory failure  . Anxiety and depression   . Atrial fibrillation (HCC) 07/25/2018   new-onset during [facility] admission in Jan 2020  . Cataract   . CHF (congestive heart failure) (HCC)    EF <= 50% as of Jan 2020  . Current use of long term anticoagulation 07/2018   Started on apixaban for new-onset a-fib in Jan 2020  . Dementia (HCC)    possible, likely age-related, documented decreased cognitive function on prior hospitalizations  . Gallstones   . Glaucoma   . Hypertension   . Hypoglycemia   . Hypothyroidism   . Inguinal hernia   . Sleep apnea   . Thyroid disease     Patient Active Problem List   Diagnosis Date Noted  . Bilateral lower leg cellulitis 01/12/2020  . Venous stasis ulcer of both lower extremities without varicose veins (HCC) 01/12/2020  . Chronic respiratory failure with hypoxia (HCC) 01/12/2020  . Cellulitis of right lower extremity   . Severe protein-calorie malnutrition (HCC)   . Right foot ulcer (HCC) 11/16/2019  . Anxiety and depression   . Cellulitis of right foot   . 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 Chronic combined systolic and diastolic CHF (congestive heart failure) (HCC) 11/16/2018  .  Anasarca 11/16/2018  . Hypothyroidism 11/16/2018  . PAF (paroxysmal atrial fibrillation) (HCC) 11/16/2018  . HTN (hypertension) 11/16/2018  . Generalized anxiety disorder 11/03/2018  . Major depression in partial remission (HCC) 11/03/2018  . Acute hypoxemic respiratory failure (HCC) 10/29/2018  . Suspected COVID-19 virus infection 10/29/2018    Past Surgical History:  Procedure Laterality Date  . FINGER AMPUTATION Right     Prior to Admission medications   Medication Sig Start Date End Date Taking? Authorizing Provider  acetaminophen (TYLENOL) 325 MG tablet Take 2 tablets (650 mg total) by mouth every 6 (six) hours as needed for mild pain (or Fever >/= 101). Patient taking differently: Take 650 mg by mouth every 6 (six) hours as needed for mild pain or headache (fever >/= 101, or minor discomfort).  11/03/18   Alford Highland, MD  albuterol (VENTOLIN HFA) 108 (90 Base) MCG/ACT inhaler Inhale 2 puffs into the lungs every 6 (six) hours as needed for wheezing or shortness of breath. Patient taking differently: Inhale 1 puff into the lungs every 6 (six) hours as needed for wheezing or shortness of breath.  11/21/19   Ghimire, Werner Lean, MD  alum & mag hydroxide-simeth (ANTACID) 200-200-20 MG/5ML suspension Take 30 mLs by mouth 4 (four) times daily as needed for indigestion or heartburn.    [provider]  ascorbic acid (VITAMIN C) 500 MG tablet Take 500 mg by mouth 2 (two) times daily.    [provider]  cephALEXin (  KEFLEX) 500 MG capsule Take 1 capsule (500 mg total) by mouth 2 (two) times daily. 02/19/20   Jene Every, MD  collagenase (SANTYL) ointment Apply topically daily. Apply to right heel ulcer plus dry dressing change daily 11/22/19   Ghimire, Werner Lean, MD  diazepam (VALIUM) 2 MG tablet Take 0.5-1 tablets (1-2 mg total) by mouth See admin instructions. Take 1 mg by mouth in the morning and 2 mg at bedtime 11/21/19   Ghimire, Werner Lean, MD  diphenhydrAMINE (DIPHENHIST)  25 MG tablet Take 25 mg by mouth every 6 (six) hours as needed (for allergic reactions).    [provider]  furosemide (LASIX) 20 MG tablet Take 2 tablets (40 mg total) by mouth daily. 11/21/19   Ghimire, Werner Lean, MD  guaiFENesin (ROBITUSSIN) 100 MG/5ML liquid Take 300 mg by mouth every 6 (six) hours as needed for cough.    [provider]  levothyroxine (SYNTHROID) 200 MCG tablet Take 1 tablet (200 mcg total) by mouth daily before breakfast. 11/21/19   Ghimire, Werner Lean, MD  liver oil-zinc oxide (DESITIN) 40 % ointment Apply topically daily. Patient taking differently: Apply 1 application topically See admin instructions. Apply to affected area once daily 11/04/18   Alford Highland, MD  loperamide (IMODIUM A-D) 2 MG tablet Take 4 mg by mouth every 3 (three) hours as needed for diarrhea or loose stools (CANNOT EXCEED 8 DOSES/24 HOURS).    [provider]  magnesium hydroxide (MILK OF MAGNESIA) 400 MG/5ML suspension Take 30 mLs by mouth daily as needed for mild constipation.    [provider]  metoCLOPramide (REGLAN) 5 MG tablet Take 5 mg by mouth every 6 (six) hours as needed for nausea or vomiting.    [provider]  Multiple Vitamins-Minerals (THERATRUM COMPLETE) TABS Take 1 tablet by mouth daily with breakfast.    [provider]  OLANZapine zydis (ZYPREXA) 10 MG disintegrating tablet Take 1 tablet (10 mg total) by mouth 2 (two) times daily. Patient taking differently: Take 15 mg by mouth at bedtime.  11/03/18   Alford Highland, MD  OXYGEN Inhale 2 L/min into the lungs See admin instructions. "2 L/min, as tolerated"    [provider]  sertraline (ZOLOFT) 25 MG tablet Take 1 tablet (25 mg total) by mouth daily. 06/10/19   Charm Rings, NP  Skin Protectants, Misc. (EUCERIN) cream Apply 1 application topically See admin instructions. Apply to both legs 2 times a day    [provider]  sulfamethoxazole-trimethoprim (BACTRIM  DS) 800-160 MG tablet Take 1 tablet by mouth 2 (two) times daily. 02/19/20   Jene Every, MD     Allergies Demerol [meperidine], Epinephrine, Nardil [phenelzine], Morphine and related, Ondansetron, and Shellfish allergy  Family History  Problem Relation Age of Onset  . Alzheimer's disease Mother     Social History Social History   Tobacco Use  . Smoking status: Former Smoker    Quit date: 10/18/2010    Years since quitting: 9.3  . Smokeless tobacco: Never Used  Substance Use Topics  . Alcohol use: No  . Drug use: No    Review of Systems limited by dementia  Constitutional: No fever/chills   Skin: Redness to the right arm    ____________________________________________   PHYSICAL EXAM:  VITAL SIGNS: ED Triage Vitals [02/19/20 2125]  Enc Vitals Group     BP 110/71     Pulse Rate 90     Resp (!) 22     Temp 98.9  F (37.2 C)     Temp Source Oral     SpO2 98 %     Weight      Height      Head Circumference      Peak Flow      Pain Score      Pain Loc      Pain Edu?      Excl. in GC?     Constitutional: Alert, no acute distress  Nose: No congestion/rhinnorhea. Mouth/Throat: Mucous membranes are moist.   Neck:  Painless ROM Cardiovascular: Normal rate, regular rhythm. Grossly normal heart sounds.  Good peripheral circulation. Respiratory: Normal respiratory effort.  No retractions. Gastrointestinal: Soft and nontender. No distention.    Musculoskeletal:  approximately 2 x 2 centimeter abscess has come to ahead right lateral forearm, mild surrounding erythema Neurologic:  Normal speech and language. No gross focal neurologic deficits are appreciated.  Skin:  Skin is warm, dry, see above. Psychiatric: Mood and affect are normal. Speech and behavior are normal.  ____________________________________________   LABS (all labs ordered are listed, but only abnormal results are displayed)  Labs Reviewed - No data to  display ____________________________________________  EKG  None ____________________________________________  RADIOLOGY  None ____________________________________________   PROCEDURES  Procedure(s) performed: No  ..Incision and Drainage  Date/Time: 02/19/2020 10:53 PM Performed by: Jene Every, MD Authorized by: Jene Every, MD   Consent:    Consent obtained:  Verbal   Consent given by:  Patient   Risks discussed:  Pain Location:    Type:  Abscess   Location:  Upper extremity   Upper extremity location:  Arm   Arm location:  R lower arm Pre-procedure details:    Skin preparation:  Chloraprep Anesthesia (see MAR for exact dosages):    Anesthesia method:  Topical application   Topical anesthetic:  LET Procedure type:    Complexity:  Complex Procedure details:    Incision types:  Stab incision   Scalpel blade:  11   Wound management:  Probed and deloculated   Drainage:  Purulent   Drainage amount:  Moderate   Packing materials:  1/4 in iodoform gauze   Amount 1/4" iodoform:  6 cm Post-procedure details:    Patient tolerance of procedure:  Tolerated well, no immediate complications     Critical Care performed: No ____________________________________________   INITIAL IMPRESSION / ASSESSMENT AND PLAN / ED COURSE  Pertinent labs & imaging results that were available during my care of the patient were reviewed by me and considered in my medical decision making (see chart for details).  Patient presents with right forearm abscess.  Vital signs are reassuring, afebrile overall well-appearing.  Mild surrounding erythema.  I&D performed with moderate purulent drainage.  P.o. antibiotics given.  Appropriate for discharge at this time.    ____________________________________________   FINAL CLINICAL IMPRESSION(S) / ED DIAGNOSES  Final diagnoses:  Abscess  Cellulitis of right upper extremity        Note:  This document was prepared using Dragon  voice recognition software and may include unintentional dictation errors.   Jene Every, MD 02/19/20 2255

## 2020-02-20 DIAGNOSIS — L02413 Cutaneous abscess of right upper limb: Secondary | ICD-10-CM | POA: Diagnosis not present

## 2020-02-20 NOTE — ED Notes (Signed)
Unable to reach legal guardian. [facility] contacted on pt return/discharge.

## 2020-02-20 NOTE — ED Triage Notes (Signed)
Pt arrived via EMS from [facility] due to wound on the right forearm. Pt has other wounds that are being treated by facility with antibiotics. Pt has legal guardian.

## 2020-02-20 NOTE — ED Notes (Signed)
Pt gave verbal consent for dc ? ? ? ?

## 2020-04-11 ENCOUNTER — Emergency Department
Admission: EM | Admit: 2020-04-11 | Discharge: 2020-04-12 | Disposition: A | Discharge status: Home / Self Care | Payer: Medicare Other | Attending: Emergency Medicine | Admitting: Emergency Medicine

## 2020-04-11 ENCOUNTER — Other Ambulatory Visit: Payer: Self-pay

## 2020-04-11 ENCOUNTER — Emergency Department: Payer: Medicare Other

## 2020-04-11 DIAGNOSIS — I110 Hypertensive heart disease with heart failure: Secondary | ICD-10-CM | POA: Insufficient documentation

## 2020-04-11 DIAGNOSIS — F0390 Unspecified dementia without behavioral disturbance: Secondary | ICD-10-CM | POA: Insufficient documentation

## 2020-04-11 DIAGNOSIS — E039 Hypothyroidism, unspecified: Secondary | ICD-10-CM | POA: Insufficient documentation

## 2020-04-11 DIAGNOSIS — R079 Chest pain, unspecified: Secondary | ICD-10-CM | POA: Insufficient documentation

## 2020-04-11 DIAGNOSIS — Z79899 Other long term (current) drug therapy: Secondary | ICD-10-CM | POA: Diagnosis not present

## 2020-04-11 DIAGNOSIS — Z79890 Hormone replacement therapy (postmenopausal): Secondary | ICD-10-CM | POA: Insufficient documentation

## 2020-04-11 DIAGNOSIS — Z87891 Personal history of nicotine dependence: Secondary | ICD-10-CM | POA: Insufficient documentation

## 2020-04-11 DIAGNOSIS — I509 Heart failure, unspecified: Secondary | ICD-10-CM | POA: Insufficient documentation

## 2020-04-11 LAB — BASIC METABOLIC PANEL
Anion gap: 10 (ref 5–15)
BUN: 18 mg/dL (ref 8–23)
CO2: 30 mmol/L (ref 22–32)
Calcium: 8.5 mg/dL — ABNORMAL LOW (ref 8.9–10.3)
Chloride: 100 mmol/L (ref 98–111)
Creatinine, Ser: 1.03 mg/dL (ref 0.61–1.24)
GFR calc Af Amer: >60 mL/min (ref >60)
GFR calc non Af Amer: >60 mL/min (ref >60)
Glucose, Bld: 88 mg/dL (ref 70–99)
Potassium: 4.2 mmol/L (ref 3.5–5.1)
Sodium: 140 mmol/L (ref 135–145)

## 2020-04-11 LAB — CBC
HCT: 42.4 % (ref 39.0–52.0)
Hemoglobin: 13.7 g/dL (ref 13.0–17.0)
MCH: 30.4 pg (ref 26.0–34.0)
MCHC: 32.3 g/dL (ref 30.0–36.0)
MCV: 94 fL (ref 80.0–100.0)
Platelets: 229 10*3/uL (ref 150–400)
RBC: 4.51 MIL/uL (ref 4.22–5.81)
RDW: 13 % (ref 11.5–15.5)
WBC: 11.5 10*3/uL — ABNORMAL HIGH (ref 4.0–10.5)
nRBC: 0 % (ref 0.0–0.2)

## 2020-04-11 LAB — TROPONIN I (HIGH SENSITIVITY)
Troponin I (High Sensitivity): 4 ng/L (ref <18)
Troponin I (High Sensitivity): 4 ng/L (ref <18)

## 2020-04-11 NOTE — ED Notes (Signed)
Pt continuing to yell from room, "Get me out of here." This RN at bedside again. This RN reiterated to pt that DSS was working on pt transport back to the facility. Pt again stating, "How long?" this RN explaining that it could be a few hours before transportation was able to come get him.

## 2020-04-11 NOTE — ED Notes (Signed)
Attempt to call legal guardian at both listed numbers in demographics with no answer, left HIPPA compliant message.

## 2020-04-11 NOTE — ED Triage Notes (Addendum)
BIB [facility] from [facility] with c/o CP. EKG WNL per EMS. Chronic 2L [place]. Pt CBG 124 with EMS. Alert to self, place and time. Disoriented to situation intermittently

## 2020-04-11 NOTE — ED Notes (Signed)
Pt given chips at this time.

## 2020-04-11 NOTE — ED Notes (Signed)
This RN attemtped to contact Please contact DSS worker, Levonne Hubert [phone]. This RN had to call after hours DSS worker at [phone]. DSS worker stating she will arrange transport

## 2020-04-11 NOTE — ED Notes (Signed)
Pt noted to be screaming out repeatedly, "get me out of here, get me out of here, help me, help me, get me out of here!". This RN to speak with patient, explained patient would need to see EDP, pt states "I don't need to see no goddamn doctor! I want to go home!" Pt noted to be getting increasingly agitated at this time, increasingly yelling out and curing at staff.

## 2020-04-11 NOTE — ED Notes (Signed)
Pt can be heard yelling from room stating, "I want to leave." This RN at bedside and informing pt he is being discharged and his legal guardian has been contacted. Pt stating, "How long?" This RN telling pt that I am unsure of an exact time but they should be here shortly.

## 2020-04-11 NOTE — ED Notes (Signed)
Pt given ginger ale and meal tray.  

## 2020-04-11 NOTE — ED Provider Notes (Signed)
[facility] Emergency Department Provider Note  ____________________________________________   First MD Initiated Contact with Patient 04/11/20 1646     (approximate)  I have reviewed the triage vital signs and the nursing notes.   HISTORY  Chief Complaint Chest Pain   HPI Eric [name] is a 74 y.o. male with a past medical history of anxiety, depression, A. fib, CHF, HTN, hypothyroidism, OSA, and dementia who presents via EMS from memory care center with concerns that he was complaining of some chest pain earlier today.  Patient states he currently does not have any chest pain and it resolved prior to him getting to the ED.  He states he wants to leave immediately.  I did attempt to reach staff members caring for the patient was able to do so calling one time.  Patient denies any falls, cough, shortness of breath but does not recall his recent past medical history is not oriented to this history is very limited from him.  No other history is immediately available on patient arrival.  Per EMS patient does wear 2 L of oxygen for chronic hypoxic respiratory failure.         Past Medical History:  Diagnosis Date   Acute respiratory failure with hypoxia (HCC)    multiple prior admissions to [facility] and [facility] for CHF exacerbation / respiratory failure   Anxiety and depression    Atrial fibrillation (HCC) 07/25/2018   new-onset during [facility] admission in Jan 2020   Cataract    CHF (congestive heart failure) (HCC)    EF <= 50% as of Jan 2020   Current use of long term anticoagulation 07/2018   Started on apixaban for new-onset a-fib in Jan 2020   Dementia [facility])    possible, likely age-related, documented decreased cognitive function on prior hospitalizations   Gallstones    Glaucoma    Hypertension    Hypoglycemia    Hypothyroidism    Inguinal hernia    Sleep apnea    Thyroid disease     Patient Active Problem List   Diagnosis Date Noted     Bilateral lower leg cellulitis 01/12/2020   Venous stasis ulcer of both lower extremities without varicose veins (HCC) 01/12/2020   Chronic respiratory failure with hypoxia (HCC) 01/12/2020   Cellulitis of right lower extremity    Severe protein-calorie malnutrition (HCC)    Right foot ulcer (HCC) 11/16/2019   Anxiety and depression    Cellulitis of right foot    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 Authorizing Provider  acetaminophen (TYLENOL) 325 MG tablet Take 2 tablets (650 mg total) by mouth every 6 (six) hours as needed for mild pain (or Fever >/= 101). Patient taking differently: Take 650 mg by mouth every 6 (six) hours as needed for mild pain or headache (fever >/= 101, or minor discomfort).  11/03/18   Alford Highland, MD  albuterol (VENTOLIN HFA)  108 (90 Base) MCG/ACT inhaler Inhale 2 puffs into the lungs every 6 (six) hours as needed for wheezing or shortness of breath. Patient taking differently: Inhale 1 puff into the lungs every 6 (six) hours as needed for wheezing or shortness of breath.  11/21/19   Ghimire, Werner LeanShanker M, MD  alum & mag hydroxide-simeth (ANTACID) 200-200-20 MG/5ML suspension Take 30 mLs by mouth 4 (four) times daily as needed for indigestion or heartburn.    [provider]  ascorbic acid (VITAMIN C) 500 MG  tablet Take 500 mg by mouth 2 (two) times daily.    [provider]  cephALEXin (KEFLEX) 500 MG capsule Take 1 capsule (500 mg total) by mouth 2 (two) times daily. 02/19/20   Jene EveryKinner, Robert, MD  collagenase (SANTYL) ointment Apply topically daily. Apply to right heel ulcer plus dry dressing change daily 11/22/19   Ghimire, Werner LeanShanker M, MD  diazepam (VALIUM) 2 MG tablet Take 0.5-1 tablets (1-2 mg total) by mouth See admin instructions. Take 1 mg by mouth in the morning and 2 mg at bedtime 11/21/19   Ghimire, Werner LeanShanker M, MD  diphenhydrAMINE (DIPHENHIST) 25 MG tablet Take 25 mg by mouth every 6 (six) hours as needed (for allergic reactions).    [provider]  furosemide (LASIX) 20 MG tablet Take 2 tablets (40 mg total) by mouth daily. 11/21/19   Ghimire, Werner LeanShanker M, MD  guaiFENesin (ROBITUSSIN) 100 MG/5ML liquid Take 300 mg by mouth every 6 (six) hours as needed for cough.    [provider]  levothyroxine (SYNTHROID) 200 MCG tablet Take 1 tablet (200 mcg total) by mouth daily before breakfast. 11/21/19   Ghimire, Werner LeanShanker M, MD  liver oil-zinc oxide (DESITIN) 40 % ointment Apply topically daily. Patient taking differently: Apply 1 application topically See admin instructions. Apply to affected area once daily 11/04/18   Alford HighlandWieting, Richard, MD  loperamide (IMODIUM A-D) 2 MG tablet Take 4 mg by mouth every 3 (three) hours as needed for diarrhea or loose stools (CANNOT EXCEED 8 DOSES/24 HOURS).    [provider]  magnesium hydroxide (MILK OF MAGNESIA) 400 MG/5ML suspension Take 30 mLs by mouth daily as needed for mild constipation.    [provider]  metoCLOPramide (REGLAN) 5 MG tablet Take 5 mg by mouth every 6 (six) hours as needed for nausea or vomiting.    [provider]  Multiple Vitamins-Minerals (THERATRUM COMPLETE) TABS Take 1 tablet by mouth daily with breakfast.    [provider]  OLANZapine zydis (ZYPREXA) 10 MG disintegrating tablet Take 1  tablet (10 mg total) by mouth 2 (two) times daily. Patient taking differently: Take 15 mg by mouth at bedtime.  11/03/18   Alford HighlandWieting, Richard, MD  OXYGEN Inhale 2 L/min into the lungs See admin instructions. "2 L/min, as tolerated"    [provider]  sertraline (ZOLOFT) 25 MG tablet Take 1 tablet (25 mg total) by mouth daily. 06/10/19   Charm RingsLord, Jamison Y, NP  Skin Protectants, Misc. (EUCERIN) cream Apply 1 application topically See admin instructions. Apply to both legs 2 times a day    [provider]  sulfamethoxazole-trimethoprim (BACTRIM DS) 800-160 MG tablet Take 1 tablet by mouth 2 (two) times daily. 02/19/20   Jene EveryKinner, Robert, MD    Allergies Demerol [meperidine], Epinephrine, Nardil [phenelzine], Morphine and related, Ondansetron, and Shellfish allergy  Family History  Problem Relation Age of Onset   Alzheimer's disease Mother     Social History Social History   Tobacco Use  Smoking status: Former Smoker    Quit date: 10/18/2010    Years since quitting: 9.4   Smokeless tobacco: Never Used  Substance Use Topics   Alcohol use: No   Drug use: No    Review of Systems  Review of Systems  Unable to perform ROS: Dementia  Cardiovascular: Positive for chest pain.      ____________________________________________   PHYSICAL EXAM:  VITAL SIGNS: ED Triage Vitals [04/11/20 1051]  Enc Vitals Group     BP 110/67     Pulse Rate 89     Resp 20     Temp 97.8 F (36.6 C)     Temp Source Oral     SpO2 100 %     Weight 200 lb (90.7 kg)     Height 5\' 7"  (1.702 m)     Head Circumference      Peak Flow      Pain Score 6     Pain Loc      Pain Edu?      Excl. in GC?    Vitals:   04/11/20 1307 04/11/20 1505  BP: 110/79 109/83  Pulse: 84 89  Resp: 20 16  Temp:    SpO2: 95% 98%   Physical Exam Vitals and nursing note reviewed.  Constitutional:      Appearance: He is well-developed.  HENT:     Head: Normocephalic and atraumatic.     Right Ear:  External ear normal.     Left Ear: External ear normal.     Nose: Nose normal.  Eyes:     Conjunctiva/sclera: Conjunctivae normal.  Cardiovascular:     Rate and Rhythm: Normal rate and regular rhythm.     Heart sounds: No murmur heard.   Pulmonary:     Effort: Pulmonary effort is normal. No respiratory distress.     Breath sounds: Normal breath sounds.  Abdominal:     Palpations: Abdomen is soft.     Tenderness: There is no abdominal tenderness.  Musculoskeletal:     Cervical back: Neck supple.  Skin:    General: Skin is warm and dry.  Neurological:     Mental Status: He is alert. Mental status is at baseline. He is confused.      ____________________________________________   LABS (all labs ordered are listed, but only abnormal results are displayed)  Labs Reviewed  BASIC METABOLIC PANEL - Abnormal; Notable for the following components:      Result Value   Calcium 8.5 (*)    All other components within normal limits  CBC - Abnormal; Notable for the following components:   WBC 11.5 (*)    All other components within normal limits  TROPONIN I (HIGH SENSITIVITY)  TROPONIN I (HIGH SENSITIVITY)   ____________________________________________  EKG  Sinus rhythm with a ventricular rate of 91, normal axis, unremarkable intervals, nonspecific changes in V2,, lead I, and V3 with no clear evidence otherwise of acute ischemia. ____________________________________________  RADIOLOGY  ED MD interpretation: No clear evidence of focal consolidation, pneumonia, edema, pneumothorax.  Official radiology report(s): DG Chest 2 View  Result Date: 04/11/2020 CLINICAL DATA:  Chest pain, shortness of breath EXAM: CHEST - 2 VIEW COMPARISON:  02/04/2020 FINDINGS: Trachea midline. Cardiomediastinal contours and hilar structures are normal. Lungs are mildly hyperinflated with flattening of the RIGHT and LEFT hemidiaphragm. Subtle increased opacity developing at the RIGHT lung base. No  effusion. On limited assessment no acute skeletal process. IMPRESSION: Question subtle increased opacity at the RIGHT  lung base which could represent atelectasis or early infiltrate. Subtle opacity at the RIGHT lung base slightly asymmetric, could reflect atelectasis or developing infection. Electronically Signed   By: Donzetta Kohut M.D.   On: 04/11/2020 11:17    ____________________________________________   PROCEDURES  Procedure(s) performed (including Critical Care):  Procedures   ____________________________________________   INITIAL IMPRESSION / ASSESSMENT AND PLAN / ED COURSE        Patient presents with [place] to history exam via EMS from his nursing facility after he complained of some chest pain to staff.  Patient is afebrile hemodynamic stable arrival.  Patient denies any chest pain on arrival states he wants to leave immediately.  He is somewhat confused but denies any other acute recent symptoms.  Exam as above.  Low suspicion for ACS at this time given patient denies any active chest pain and there is no clear evidence of ischemia on ECG and troponins obtained over 2 hours are nonelevated.  Chest x-ray does not show convincing evidence of pneumonia, pneumothorax, effusion, or other acute thoracic process.  There is no widened mediastinum or other historical or exam factors to suggest dissection.  Low suspicion for PE as patient states he is asymptomatic and is on his chronic 2 L with no evidence of acute hypoxia, tachypnea, or other respiratory distress.  CBC shows no evidence of anemia and is otherwise unremarkable.  BMP shows no evidence of significant electrolyte or metabolic derangements.  Exam shows no evidence of traumatic injuries or cellulitis.  Unclear etiology for patient's chest pain although given reassuring vital signs with otherwise reassuring work-up and patient denying any symptoms at this time I believe he is safe for discharge back to nursing facility with plan for  outpatient follow-up.  Discharged stable condition.   ____________________________________________   FINAL CLINICAL IMPRESSION(S) / ED DIAGNOSES  Final diagnoses:  Nonspecific chest pain    Medications - No data to display   ED Discharge Orders    None       Note:  This document was prepared using Dragon voice recognition software and may include unintentional dictation errors.   Gilles Chiquito, MD 04/11/20 [phone]

## 2020-04-11 NOTE — ED Notes (Signed)
This RN called DSS to get a status update on transport. DSS stating [facility] in route to come get pt.

## 2020-04-12 NOTE — ED Notes (Signed)
Pt has been scratching place on right lower leg that is now bleeding. This RN wrapped wound in non-adhesive pad and secured it with Kerlix.

## 2020-04-12 NOTE — ED Notes (Signed)
EMS called back and stated they do not know what happened with [facility] transport but will send one of their trucks to transport.

## 2020-04-30 ENCOUNTER — Other Ambulatory Visit: Payer: Self-pay

## 2020-04-30 ENCOUNTER — Encounter: Payer: Self-pay | Admitting: Emergency Medicine

## 2020-04-30 ENCOUNTER — Emergency Department: Payer: Medicare Other

## 2020-04-30 ENCOUNTER — Emergency Department
Admission: EM | Admit: 2020-04-30 | Discharge: 2020-04-30 | Disposition: A | Discharge status: Home / Self Care | Payer: Medicare Other | Attending: Emergency Medicine | Admitting: Emergency Medicine

## 2020-04-30 DIAGNOSIS — E039 Hypothyroidism, unspecified: Secondary | ICD-10-CM | POA: Insufficient documentation

## 2020-04-30 DIAGNOSIS — Z79899 Other long term (current) drug therapy: Secondary | ICD-10-CM | POA: Insufficient documentation

## 2020-04-30 DIAGNOSIS — I5042 Chronic combined systolic (congestive) and diastolic (congestive) heart failure: Secondary | ICD-10-CM | POA: Diagnosis not present

## 2020-04-30 DIAGNOSIS — Z7901 Long term (current) use of anticoagulants: Secondary | ICD-10-CM | POA: Insufficient documentation

## 2020-04-30 DIAGNOSIS — W19XXXA Unspecified fall, initial encounter: Secondary | ICD-10-CM | POA: Insufficient documentation

## 2020-04-30 DIAGNOSIS — F0280 Dementia in other diseases classified elsewhere without behavioral disturbance: Secondary | ICD-10-CM | POA: Diagnosis not present

## 2020-04-30 DIAGNOSIS — I110 Hypertensive heart disease with heart failure: Secondary | ICD-10-CM | POA: Insufficient documentation

## 2020-04-30 DIAGNOSIS — S0081XA Abrasion of other part of head, initial encounter: Secondary | ICD-10-CM | POA: Diagnosis not present

## 2020-04-30 NOTE — ED Notes (Signed)
See triage note. Pt had fall today, small abrasion noted on head. NAD noted, patient denies pain and appears to have no pain per painad scale. Pt altered at baseline, but has no further alteration from baseline per home.

## 2020-04-30 NOTE — ED Triage Notes (Signed)
Pt arrival via [facility] from [facility] due to fall. Pt had unwitnessed fall at the [facility] where it appeared that he was in his wheelchair and had fallen forward.   VS within normal limits with EMS.

## 2020-04-30 NOTE — ED Provider Notes (Signed)
[facility] Emergency Department Provider Note  ____________________________________________  Time seen: Approximately 5:59 AM  I have reviewed the triage vital signs and the nursing notes.   HISTORY  Chief Complaint Fall  Level 5 caveat:  Portions of the history and physical were unable to be obtained due to dementia   HPI Eric [name] is a 74 y.o. male with several chronic medical problems as listed below who presents for evaluation of a fall.  Patient comes from [facility] nursing home after having an unwitnessed fall.   Per history from EMS it seems like patient was sitting on his wheelchair when he fell asleep and fell forward.  He hit his forehead onto the floor.  No LOC.  Is not on blood thinners.  Patient has an abrasion to his forehead.  Patient denies headache, neck pain, back pain, hip pain, chest pain, abdominal pain, extremity pain.  Past Medical History:  Diagnosis Date  . Acute respiratory failure with hypoxia (HCC)    multiple prior admissions to [facility] and [facility] for CHF exacerbation / respiratory failure  . Anxiety and depression   . Atrial fibrillation (HCC) 07/25/2018   new-onset during [facility] admission in Jan 2020  . Cataract   . CHF (congestive heart failure) (HCC)    EF <= 50% as of Jan 2020  . Current use of long term anticoagulation 07/2018   Started on apixaban for new-onset a-fib in Jan 2020  . Dementia (HCC)    possible, likely age-related, documented decreased cognitive function on prior hospitalizations  . Gallstones   . Glaucoma   . Hypertension   . Hypoglycemia   . Hypothyroidism   . Inguinal hernia   . Sleep apnea   . Thyroid disease     Patient Active Problem List   Diagnosis Date Noted  . Bilateral lower leg cellulitis 01/12/2020  . Venous stasis ulcer of both lower extremities without varicose veins (HCC) 01/12/2020  . Chronic respiratory failure with hypoxia (HCC) 01/12/2020  . Cellulitis of right lower  extremity   . Severe protein-calorie malnutrition (HCC)   . Right foot ulcer (HCC) 11/16/2019  . Anxiety and depression   . Cellulitis of right foot   . 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 Chronic combined systolic and diastolic CHF (congestive heart failure) (HCC) 11/16/2018  . Anasarca 11/16/2018  . Hypothyroidism 11/16/2018  . PAF (paroxysmal atrial fibrillation) (HCC) 11/16/2018  . HTN (hypertension) 11/16/2018  . Generalized anxiety disorder 11/03/2018  . Major depression in partial remission (HCC) 11/03/2018  . Acute hypoxemic respiratory failure (HCC) 10/29/2018  . Suspected COVID-19 virus infection 10/29/2018    Past Surgical History:  Procedure Laterality Date  . FINGER AMPUTATION Right     Prior to Admission medications   Medication Sig Start Date End Date Taking? Authorizing Provider  acetaminophen (TYLENOL) 325 MG tablet Take 2 tablets (650 mg total) by mouth every 6 (six) hours as needed for mild pain (or Fever >/= 101). Patient taking differently: Take 650 mg by mouth every 6 (six) hours as needed for mild pain or headache (fever >/= 101, or minor discomfort).  11/03/18   Alford Highland, MD  albuterol (VENTOLIN HFA) 108 (90 Base) MCG/ACT inhaler Inhale 2 puffs into the lungs every 6 (six) hours as needed for wheezing or shortness of breath. Patient taking differently: Inhale 1 puff into the lungs every 6 (six) hours as needed for wheezing or shortness of breath.  11/21/19  Ghimire, Werner LeanShanker M, MD  alum & mag hydroxide-simeth (ANTACID) 200-200-20 MG/5ML suspension Take 30 mLs by mouth 4 (four) times daily as needed for indigestion or heartburn.    [provider]  ascorbic acid (VITAMIN C) 500 MG tablet Take 500 mg by mouth 2 (two) times daily.    [provider]  cephALEXin (KEFLEX) 500 MG capsule Take 1 capsule (500 mg total) by mouth 2 (two) times daily. 02/19/20   Jene EveryKinner, Robert, MD  collagenase (SANTYL)  ointment Apply topically daily. Apply to right heel ulcer plus dry dressing change daily 11/22/19   Ghimire, Werner LeanShanker M, MD  diazepam (VALIUM) 2 MG tablet Take 0.5-1 tablets (1-2 mg total) by mouth See admin instructions. Take 1 mg by mouth in the morning and 2 mg at bedtime 11/21/19   Ghimire, Werner LeanShanker M, MD  diphenhydrAMINE (DIPHENHIST) 25 MG tablet Take 25 mg by mouth every 6 (six) hours as needed (for allergic reactions).    [provider]  furosemide (LASIX) 20 MG tablet Take 2 tablets (40 mg total) by mouth daily. 11/21/19   Ghimire, Werner LeanShanker M, MD  guaiFENesin (ROBITUSSIN) 100 MG/5ML liquid Take 300 mg by mouth every 6 (six) hours as needed for cough.    [provider]  levothyroxine (SYNTHROID) 200 MCG tablet Take 1 tablet (200 mcg total) by mouth daily before breakfast. 11/21/19   Ghimire, Werner LeanShanker M, MD  liver oil-zinc oxide (DESITIN) 40 % ointment Apply topically daily. Patient taking differently: Apply 1 application topically See admin instructions. Apply to affected area once daily 11/04/18   Alford HighlandWieting, Richard, MD  loperamide (IMODIUM A-D) 2 MG tablet Take 4 mg by mouth every 3 (three) hours as needed for diarrhea or loose stools (CANNOT EXCEED 8 DOSES/24 HOURS).    [provider]  magnesium hydroxide (MILK OF MAGNESIA) 400 MG/5ML suspension Take 30 mLs by mouth daily as needed for mild constipation.    [provider]  metoCLOPramide (REGLAN) 5 MG tablet Take 5 mg by mouth every 6 (six) hours as needed for nausea or vomiting.    [provider]  Multiple Vitamins-Minerals (THERATRUM COMPLETE) TABS Take 1 tablet by mouth daily with breakfast.    [provider]  OLANZapine zydis (ZYPREXA) 10 MG disintegrating tablet Take 1 tablet (10 mg total) by mouth 2 (two) times daily. Patient taking differently: Take 15 mg by mouth at bedtime.  11/03/18   Alford HighlandWieting, Richard, MD  OXYGEN Inhale 2 L/min into the lungs See admin instructions. "2 L/min, as tolerated"     [provider]  sertraline (ZOLOFT) 25 MG tablet Take 1 tablet (25 mg total) by mouth daily. 06/10/19   Charm RingsLord, Jamison Y, NP  Skin Protectants, Misc. (EUCERIN) cream Apply 1 application topically See admin instructions. Apply to both legs 2 times a day    [provider]  sulfamethoxazole-trimethoprim (BACTRIM DS) 800-160 MG tablet Take 1 tablet by mouth 2 (two) times daily. 02/19/20   Jene EveryKinner, Robert, MD    Allergies Demerol [meperidine], Epinephrine, Nardil [phenelzine], Morphine and related, Ondansetron, and Shellfish allergy  Family History  Problem Relation Age of Onset  . Alzheimer's disease Mother     Social History Social History   Tobacco Use  . Smoking status: Former Smoker    Quit date: 10/18/2010    Years since quitting: 9.5  . Smokeless tobacco: Never Used  Substance Use Topics  . Alcohol use: No  . Drug use: No    Review of Systems  Constitutional: Negative  for fever. Eyes: Negative for visual changes. ENT: Negative for facial injury or neck injury Cardiovascular: Negative for chest injury. Respiratory: Negative for shortness of breath. Negative for chest wall injury. Gastrointestinal: Negative for abdominal pain or injury. Genitourinary: Negative for dysuria. Musculoskeletal: Negative for back injury, negative for arm or leg pain. Skin: Negative for laceration/abrasions. Neurological: + head injury.   ____________________________________________   PHYSICAL EXAM:  VITAL SIGNS: ED Triage Vitals [04/30/20 0552]  Enc Vitals Group     BP (!) 143/83     Pulse Rate 100     Resp 16     Temp 98.8 F (37.1 C)     Temp Source Oral     SpO2 93 %     Weight 198 lb 6.6 oz (90 kg)     Height 5\' 7"  (1.702 m)     Head Circumference      Peak Flow      Pain Score      Pain Loc      Pain Edu?      Excl. in GC?     Full spinal precautions maintained throughout the trauma exam. Constitutional: Alert and oriented to self. No acute distress.  Does not appear intoxicated. HEENT Head: Normocephalic and atraumatic. Face: No facial bony tenderness. Stable midface.  Bruising to the forehead Ears: No hemotympanum bilaterally. No Battle sign Eyes: No eye injury. PERRL. No raccoon eyes Nose: Nontender. No epistaxis. No rhinorrhea Mouth/Throat: Mucous membranes are moist. No oropharyngeal blood. No dental injury. Airway patent without stridor. Normal voice. Neck: no C-collar. No midline c-spine tenderness.  Cardiovascular: Normal rate, regular rhythm. Normal and symmetric distal pulses are present in all extremities. Pulmonary/Chest: Chest wall is stable and nontender to palpation/compression. Normal respiratory effort. Breath sounds are normal. No crepitus.  Abdominal: Soft, nontender, non distended. Musculoskeletal: Nontender with normal full range of motion in all extremities. No deformities. No thoracic or lumbar midline spinal tenderness. Pelvis is stable. Skin: Skin is warm, dry and intact. No abrasions or contutions. Psychiatric: Speech and behavior are appropriate. Neurological: Normal speech and language. Moves all extremities to command. No gross focal neurologic deficits are appreciated.  Glascow Coma Score: 4 - Opens eyes on own 6 - Follows simple motor commands 4 - Seems confused, disoriented GCS: 14   ____________________________________________   LABS (all labs ordered are listed, but only abnormal results are displayed)  Labs Reviewed - No data to display ____________________________________________  EKG  ED ECG REPORT I, , the attending physician, personally viewed and interpreted this ECG.  Normal sinus rhythm, rate of 93, normal intervals, no ST elevations or depressions. ____________________________________________  RADIOLOGY  I have personally reviewed the images performed during this visit and I agree with the Radiologist's read.   Interpretation by Radiologist:  No results  found.    ____________________________________________   PROCEDURES  Procedure(s) performed:yes  .1-3 Lead EKG Interpretation Performed by: Nita Sickle, MD Authorized by: Nita Sickle, MD     Interpretation: non-specific     ECG rate assessment: normal     Rhythm: sinus rhythm     Ectopy: none     Critical Care performed:  None ____________________________________________   INITIAL IMPRESSION / ASSESSMENT AND PLAN / ED COURSE   74 y.o. male with several chronic medical problems as listed below who presents for evaluation of a fall.  Patient fell forward from the wheelchair onto the floor.  Has an abrasion to the forehead.  No CT no spine tenderness, full painless  range of motion of extremities.  No signs of symptoms of basilar skull fracture.  He is not on any blood thinners.  Patient is well-known to our department and seems to be at his baseline at this time.  EKG showing no signs of dysrhythmias.  Patient placed on telemetry for monitoring.  CT head and cervical spine pending.  Old medical records reviewed.  _________________________ 7:07 AM on 04/30/2020 ----------------------------------------- Imaging pending. Care transferred to Dr. Scotty Court.      ____________________________________________  Please note:  Patient was evaluated in Emergency Department today for the symptoms described in the history of present illness. Patient was evaluated in the context of the global COVID-19 pandemic, which necessitated consideration that the patient might be at risk for infection with the SARS-CoV-2 virus that causes COVID-19. Institutional protocols and algorithms that pertain to the evaluation of patients at risk for COVID-19 are in a state of rapid change based on information released by regulatory bodies including the CDC and federal and state organizations. These policies and algorithms were followed during the patient's care in the ED.  Some ED evaluations and  interventions may be delayed as a result of limited staffing during the pandemic.   ____________________________________________   FINAL CLINICAL IMPRESSION(S) / ED DIAGNOSES   Final diagnoses:  Fall, initial encounter  Abrasion of forehead, initial encounter      NEW MEDICATIONS STARTED DURING THIS VISIT:  ED Discharge Orders    None       Note:  This document was prepared using Dragon voice recognition software and may include unintentional dictation errors.    Nita Sickle, MD 04/30/20 [phone]

## 2020-04-30 NOTE — ED Provider Notes (Signed)
Procedures     ----------------------------------------- 7:35 AM on 04/30/2020 -----------------------------------------   CT head and neck unremarkable.  Stable for discharge home.  No acute respiratory issues or serious traumatic injury   Sharman Cheek, MD 04/30/20 [phone]

## 2020-04-30 NOTE — ED Notes (Signed)
Per [facility] called to transport back to the [facility] as he is unable to stand or ambulate

## 2020-04-30 NOTE — Discharge Instructions (Signed)

## 2020-05-01 ENCOUNTER — Other Ambulatory Visit: Payer: Self-pay

## 2020-05-01 ENCOUNTER — Emergency Department: Payer: Medicare Other

## 2020-05-01 DIAGNOSIS — Z20822 Contact with and (suspected) exposure to covid-19: Secondary | ICD-10-CM | POA: Insufficient documentation

## 2020-05-01 DIAGNOSIS — I5042 Chronic combined systolic (congestive) and diastolic (congestive) heart failure: Secondary | ICD-10-CM | POA: Diagnosis not present

## 2020-05-01 DIAGNOSIS — F0390 Unspecified dementia without behavioral disturbance: Secondary | ICD-10-CM | POA: Diagnosis not present

## 2020-05-01 DIAGNOSIS — E039 Hypothyroidism, unspecified: Secondary | ICD-10-CM | POA: Insufficient documentation

## 2020-05-01 DIAGNOSIS — Z87891 Personal history of nicotine dependence: Secondary | ICD-10-CM | POA: Diagnosis not present

## 2020-05-01 DIAGNOSIS — R0789 Other chest pain: Secondary | ICD-10-CM | POA: Insufficient documentation

## 2020-05-01 DIAGNOSIS — Z79899 Other long term (current) drug therapy: Secondary | ICD-10-CM | POA: Diagnosis not present

## 2020-05-01 DIAGNOSIS — I110 Hypertensive heart disease with heart failure: Secondary | ICD-10-CM | POA: Diagnosis not present

## 2020-05-01 LAB — CBC
HCT: 39.2 % (ref 39.0–52.0)
Hemoglobin: 12.6 g/dL — ABNORMAL LOW (ref 13.0–17.0)
MCH: 29.6 pg (ref 26.0–34.0)
MCHC: 32.1 g/dL (ref 30.0–36.0)
MCV: 92 fL (ref 80.0–100.0)
Platelets: 203 10*3/uL (ref 150–400)
RBC: 4.26 MIL/uL (ref 4.22–5.81)
RDW: 13 % (ref 11.5–15.5)
WBC: 9.4 10*3/uL (ref 4.0–10.5)
nRBC: 0 % (ref 0.0–0.2)

## 2020-05-01 LAB — COMPREHENSIVE METABOLIC PANEL
ALT: 12 U/L (ref 0–44)
AST: 14 U/L — ABNORMAL LOW (ref 15–41)
Albumin: 3.1 g/dL — ABNORMAL LOW (ref 3.5–5.0)
Alkaline Phosphatase: 71 U/L (ref 38–126)
Anion gap: 7 (ref 5–15)
BUN: 22 mg/dL (ref 8–23)
CO2: 28 mmol/L (ref 22–32)
Calcium: 8.3 mg/dL — ABNORMAL LOW (ref 8.9–10.3)
Chloride: 105 mmol/L (ref 98–111)
Creatinine, Ser: 0.93 mg/dL (ref 0.61–1.24)
GFR, Estimated: >60 mL/min (ref >60)
Glucose, Bld: 114 mg/dL — ABNORMAL HIGH (ref 70–99)
Potassium: 4.2 mmol/L (ref 3.5–5.1)
Sodium: 140 mmol/L (ref 135–145)
Total Bilirubin: 0.9 mg/dL (ref 0.3–1.2)
Total Protein: 6.8 g/dL (ref 6.5–8.1)

## 2020-05-01 LAB — TROPONIN I (HIGH SENSITIVITY): Troponin I (High Sensitivity): 4 ng/L (ref <18)

## 2020-05-01 NOTE — ED Triage Notes (Signed)
Pt brought in by [facility] from the [facility] for general malaise. Pt co chest pain states for a long time.  No other accompanying symptoms at this time, pt in no distress at this time.

## 2020-05-02 ENCOUNTER — Emergency Department
Admission: EM | Admit: 2020-05-02 | Discharge: 2020-05-02 | Disposition: A | Discharge status: Skilled Nursing Facility | Payer: Medicare Other | Attending: Emergency Medicine | Admitting: Emergency Medicine

## 2020-05-02 DIAGNOSIS — R0789 Other chest pain: Secondary | ICD-10-CM

## 2020-05-02 LAB — RESPIRATORY PANEL BY RT PCR (FLU A&B, COVID)
Influenza A by PCR: NEGATIVE
Influenza B by PCR: NEGATIVE
SARS Coronavirus 2 by RT PCR: NEGATIVE

## 2020-05-02 LAB — TROPONIN I (HIGH SENSITIVITY): Troponin I (High Sensitivity): 5 ng/L (ref <18)

## 2020-05-02 NOTE — ED Notes (Signed)
Pt legal guardian notified of patient visit and DC

## 2020-05-02 NOTE — ED Provider Notes (Signed)
[facility] Emergency Department Provider Note   ____________________________________________   First MD Initiated Contact with Patient 05/02/20 0424     (approximate)  I have reviewed the triage vital signs and the nursing notes.   HISTORY  Chief Complaint Chest Pain and Weakness    HPI Eric [name] is a 74 y.o. male with past medical history of dementia, hypertension, atrial fibrillation on Eliquis, and CHF on chronic supplemental oxygen who presents to the ED complaining of chest pain.  Patient reports he has been having intermittent pins-and-needles type pain in the center of his chest for greater than 1 year.  He denies any associated fevers or shortness of breath, does endorse a chronic cough that is nonproductive and and unchanged today.  He resides at the [facility], who called EMS for the patient to be evaluated when he complained of chest pain earlier today.        Past Medical History:  Diagnosis Date  . Acute respiratory failure with hypoxia (HCC)    multiple prior admissions to [facility] and [facility] for CHF exacerbation / respiratory failure  . Anxiety and depression   . Atrial fibrillation (HCC) 07/25/2018   new-onset during [facility] admission in Jan 2020  . Cataract   . CHF (congestive heart failure) (HCC)    EF <= 50% as of Jan 2020  . Current use of long term anticoagulation 07/2018   Started on apixaban for new-onset a-fib in Jan 2020  . Dementia (HCC)    possible, likely age-related, documented decreased cognitive function on prior hospitalizations  . Gallstones   . Glaucoma   . Hypertension   . Hypoglycemia   . Hypothyroidism   . Inguinal hernia   . Sleep apnea   . Thyroid disease     Patient Active Problem List   Diagnosis Date Noted  . Bilateral lower leg cellulitis 01/12/2020  . Venous stasis ulcer of both lower extremities without varicose veins (HCC) 01/12/2020  . Chronic respiratory failure with hypoxia (HCC)  01/12/2020  . Cellulitis of right lower extremity   . Severe protein-calorie malnutrition (HCC)   . Right foot ulcer (HCC) 11/16/2019  . Anxiety and depression   . Cellulitis of right foot   . 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 Chronic combined systolic and diastolic CHF (congestive heart failure) (HCC) 11/16/2018  . Anasarca 11/16/2018  . Hypothyroidism 11/16/2018  . PAF (paroxysmal atrial fibrillation) (HCC) 11/16/2018  . HTN (hypertension) 11/16/2018  . Generalized anxiety disorder 11/03/2018  . Major depression in partial remission (HCC) 11/03/2018  . Acute hypoxemic respiratory failure (HCC) 10/29/2018  . Suspected COVID-19 virus infection 10/29/2018    Past Surgical History:  Procedure Laterality Date  . FINGER AMPUTATION Right     Prior to Admission medications   Medication Sig Start Date End Date Taking? Authorizing Provider  acetaminophen (TYLENOL) 325 MG tablet Take 2 tablets (650 mg total) by mouth every 6 (six) hours as needed for mild pain (or Fever >/= 101). Patient taking differently: Take 650 mg by mouth every 6 (six) hours as needed for mild pain or headache (fever >/= 101, or minor discomfort).  11/03/18   Alford Highland, MD  albuterol (VENTOLIN HFA) 108 (90 Base) MCG/ACT inhaler Inhale 2 puffs into the lungs every 6 (six) hours as needed for wheezing or shortness of breath. Patient taking differently: Inhale 1 puff into the lungs every 6 (six) hours as needed for wheezing or shortness  of breath.  11/21/19   Ghimire, Werner LeanShanker M, MD  alum & mag hydroxide-simeth (ANTACID) 200-200-20 MG/5ML suspension Take 30 mLs by mouth 4 (four) times daily as needed for indigestion or heartburn.    [provider]  ascorbic acid (VITAMIN C) 500 MG tablet Take 500 mg by mouth 2 (two) times daily.    [provider]  cephALEXin (KEFLEX) 500 MG capsule Take 1 capsule (500 mg total) by mouth 2 (two) times daily. 02/19/20    Jene EveryKinner, Robert, MD  collagenase (SANTYL) ointment Apply topically daily. Apply to right heel ulcer plus dry dressing change daily 11/22/19   Ghimire, Werner LeanShanker M, MD  diazepam (VALIUM) 2 MG tablet Take 0.5-1 tablets (1-2 mg total) by mouth See admin instructions. Take 1 mg by mouth in the morning and 2 mg at bedtime 11/21/19   Ghimire, Werner LeanShanker M, MD  diphenhydrAMINE (DIPHENHIST) 25 MG tablet Take 25 mg by mouth every 6 (six) hours as needed (for allergic reactions).    [provider]  furosemide (LASIX) 20 MG tablet Take 2 tablets (40 mg total) by mouth daily. 11/21/19   Ghimire, Werner LeanShanker M, MD  guaiFENesin (ROBITUSSIN) 100 MG/5ML liquid Take 300 mg by mouth every 6 (six) hours as needed for cough.    [provider]  levothyroxine (SYNTHROID) 200 MCG tablet Take 1 tablet (200 mcg total) by mouth daily before breakfast. 11/21/19   Ghimire, Werner LeanShanker M, MD  liver oil-zinc oxide (DESITIN) 40 % ointment Apply topically daily. Patient taking differently: Apply 1 application topically See admin instructions. Apply to affected area once daily 11/04/18   Alford HighlandWieting, Richard, MD  loperamide (IMODIUM A-D) 2 MG tablet Take 4 mg by mouth every 3 (three) hours as needed for diarrhea or loose stools (CANNOT EXCEED 8 DOSES/24 HOURS).    [provider]  magnesium hydroxide (MILK OF MAGNESIA) 400 MG/5ML suspension Take 30 mLs by mouth daily as needed for mild constipation.    [provider]  metoCLOPramide (REGLAN) 5 MG tablet Take 5 mg by mouth every 6 (six) hours as needed for nausea or vomiting.    [provider]  Multiple Vitamins-Minerals (THERATRUM COMPLETE) TABS Take 1 tablet by mouth daily with breakfast.    [provider]  OLANZapine zydis (ZYPREXA) 10 MG disintegrating tablet Take 1 tablet (10 mg total) by mouth 2 (two) times daily. Patient taking differently: Take 15 mg by mouth at bedtime.  11/03/18   Alford HighlandWieting, Richard, MD  OXYGEN Inhale 2 L/min into the lungs See  admin instructions. "2 L/min, as tolerated"    [provider]  sertraline (ZOLOFT) 25 MG tablet Take 1 tablet (25 mg total) by mouth daily. 06/10/19   Charm RingsLord, Jamison Y, NP  Skin Protectants, Misc. (EUCERIN) cream Apply 1 application topically See admin instructions. Apply to both legs 2 times a day    [provider]  sulfamethoxazole-trimethoprim (BACTRIM DS) 800-160 MG tablet Take 1 tablet by mouth 2 (two) times daily. 02/19/20   Jene EveryKinner, Robert, MD    Allergies Demerol [meperidine], Epinephrine, Nardil [phenelzine], Morphine and related, Ondansetron, and Shellfish allergy  Family History  Problem Relation Age of Onset  . Alzheimer's disease Mother     Social History Social History   Tobacco Use  . Smoking status: Former Smoker    Quit date: 10/18/2010    Years since quitting: 9.5  . Smokeless tobacco: Never Used  Substance Use Topics  . Alcohol use: No  . Drug use: No  Review of Systems  Constitutional: No fever/chills Eyes: No visual changes. ENT: No sore throat. Cardiovascular: Positive for chest pain. Respiratory: Denies shortness of breath. Gastrointestinal: No abdominal pain.  No nausea, no vomiting.  No diarrhea.  No constipation. Genitourinary: Negative for dysuria. Musculoskeletal: Negative for back pain. Skin: Negative for rash. Neurological: Negative for headaches, focal weakness or numbness.  ____________________________________________   PHYSICAL EXAM:  VITAL SIGNS: ED Triage Vitals [05/01/20 2259]  Enc Vitals Group     BP 134/60     Pulse Rate 66     Resp (!) 22     Temp 98.4 F (36.9 C)     Temp Source Oral     SpO2 98 %     Weight 198 lb 6.6 oz (90 kg)     Height      Head Circumference      Peak Flow      Pain Score 5     Pain Loc      Pain Edu?      Excl. in GC?     Constitutional: Awake and alert. Eyes: Conjunctivae are normal. Head: Atraumatic. Nose: No congestion/rhinnorhea. Mouth/Throat: Mucous membranes are  moist. Neck: Normal ROM Cardiovascular: Normal rate, regular rhythm. Grossly normal heart sounds.  2+ radial pulses bilaterally. Respiratory: Normal respiratory effort.  No retractions. Lungs CTAB. Gastrointestinal: Soft and nontender. No distention. Genitourinary: deferred Musculoskeletal: No lower extremity tenderness nor edema. Neurologic:  Normal speech and language. No gross focal neurologic deficits are appreciated. Skin:  Skin is warm, dry and intact. No rash noted. Psychiatric: Mood and affect are normal. Speech and behavior are normal.  ____________________________________________   LABS (all labs ordered are listed, but only abnormal results are displayed)  Labs Reviewed  CBC - Abnormal; Notable for the following components:      Result Value   Hemoglobin 12.6 (*)    All other components within normal limits  COMPREHENSIVE METABOLIC PANEL - Abnormal; Notable for the following components:   Glucose, Bld 114 (*)    Calcium 8.3 (*)    Albumin 3.1 (*)    AST 14 (*)    All other components within normal limits  RESPIRATORY PANEL BY RT PCR (FLU A&B, COVID)  TROPONIN I (HIGH SENSITIVITY)  TROPONIN I (HIGH SENSITIVITY)   ____________________________________________  EKG  ED ECG REPORT I, Chesley Noon, the attending physician, personally viewed and interpreted this ECG.   Date: 05/02/2020  EKG Time: 23:06  Rate: 73  Rhythm: normal sinus rhythm  Axis: Normal  Intervals:none  ST&T Change: None   PROCEDURES  Procedure(s) performed (including Critical Care):  Procedures   ____________________________________________   INITIAL IMPRESSION / ASSESSMENT AND PLAN / ED COURSE       74 year old male with past medical history of dementia, hypertension, atrial fibrillation, and CHF on chronic supplemental oxygen who presents to the ED complaining of intermittent pins-and-needles pain in the center of his chest for greater than 1 year.  He denies any chest pain  currently and work-up thus far has been reassuring.  EKG shows no evidence of arrhythmia or ischemia and 2 sets of troponin are negative.  Chest x-ray is also negative for acute process by my read.  Given atypical symptoms with reassuring work-up, patient is appropriate for discharge home with PCP follow-up.      ____________________________________________   FINAL CLINICAL IMPRESSION(S) / ED DIAGNOSES  Final diagnoses:  Atypical chest pain     ED Discharge Orders    None  Note:  This document was prepared using Dragon voice recognition software and may include unintentional dictation errors.   Chesley Noon, MD 05/02/20 [phone]

## 2020-05-02 NOTE — ED Notes (Signed)
Covid swab obtained and sent.

## 2020-05-02 NOTE — ED Notes (Signed)
Attempted report to facility 0530 and 0553. No answer. Reports given to EMS

## 2020-06-04 ENCOUNTER — Emergency Department
Admission: EM | Admit: 2020-06-04 | Discharge: 2020-06-04 | Disposition: A | Discharge status: Home / Self Care | Payer: Medicare Other | Attending: Emergency Medicine | Admitting: Emergency Medicine

## 2020-06-04 ENCOUNTER — Emergency Department: Payer: Medicare Other

## 2020-06-04 ENCOUNTER — Other Ambulatory Visit: Payer: Self-pay

## 2020-06-04 DIAGNOSIS — I110 Hypertensive heart disease with heart failure: Secondary | ICD-10-CM | POA: Diagnosis not present

## 2020-06-04 DIAGNOSIS — E039 Hypothyroidism, unspecified: Secondary | ICD-10-CM | POA: Insufficient documentation

## 2020-06-04 DIAGNOSIS — R600 Localized edema: Secondary | ICD-10-CM | POA: Insufficient documentation

## 2020-06-04 DIAGNOSIS — F0390 Unspecified dementia without behavioral disturbance: Secondary | ICD-10-CM | POA: Insufficient documentation

## 2020-06-04 DIAGNOSIS — Z20822 Contact with and (suspected) exposure to covid-19: Secondary | ICD-10-CM | POA: Diagnosis not present

## 2020-06-04 DIAGNOSIS — K5900 Constipation, unspecified: Secondary | ICD-10-CM | POA: Diagnosis not present

## 2020-06-04 DIAGNOSIS — I5042 Chronic combined systolic (congestive) and diastolic (congestive) heart failure: Secondary | ICD-10-CM | POA: Insufficient documentation

## 2020-06-04 DIAGNOSIS — Z87891 Personal history of nicotine dependence: Secondary | ICD-10-CM | POA: Insufficient documentation

## 2020-06-04 DIAGNOSIS — R109 Unspecified abdominal pain: Secondary | ICD-10-CM | POA: Diagnosis present

## 2020-06-04 DIAGNOSIS — K5641 Fecal impaction: Secondary | ICD-10-CM

## 2020-06-04 DIAGNOSIS — E860 Dehydration: Secondary | ICD-10-CM | POA: Insufficient documentation

## 2020-06-04 DIAGNOSIS — Z79899 Other long term (current) drug therapy: Secondary | ICD-10-CM | POA: Diagnosis not present

## 2020-06-04 DIAGNOSIS — L03119 Cellulitis of unspecified part of limb: Secondary | ICD-10-CM | POA: Diagnosis not present

## 2020-06-04 LAB — CBC WITH DIFFERENTIAL/PLATELET
Abs Immature Granulocytes: 0.05 10*3/uL (ref 0.00–0.07)
Basophils Absolute: 0 10*3/uL (ref 0.0–0.1)
Basophils Relative: 0 %
Eosinophils Absolute: 0.1 10*3/uL (ref 0.0–0.5)
Eosinophils Relative: 1 %
HCT: 37.2 % — ABNORMAL LOW (ref 39.0–52.0)
Hemoglobin: 12 g/dL — ABNORMAL LOW (ref 13.0–17.0)
Immature Granulocytes: 1 %
Lymphocytes Relative: 5 %
Lymphs Abs: 0.6 10*3/uL — ABNORMAL LOW (ref 0.7–4.0)
MCH: 29.6 pg (ref 26.0–34.0)
MCHC: 32.3 g/dL (ref 30.0–36.0)
MCV: 91.9 fL (ref 80.0–100.0)
Monocytes Absolute: 1 10*3/uL (ref 0.1–1.0)
Monocytes Relative: 9 %
Neutro Abs: 8.7 10*3/uL — ABNORMAL HIGH (ref 1.7–7.7)
Neutrophils Relative %: 84 %
Platelets: 182 10*3/uL (ref 150–400)
RBC: 4.05 MIL/uL — ABNORMAL LOW (ref 4.22–5.81)
RDW: 13.6 % (ref 11.5–15.5)
WBC: 10.4 10*3/uL (ref 4.0–10.5)
nRBC: 0 % (ref 0.0–0.2)

## 2020-06-04 LAB — BLOOD GAS, VENOUS
Acid-Base Excess: 3.5 mmol/L — ABNORMAL HIGH (ref 0.0–2.0)
Bicarbonate: 29.6 mmol/L — ABNORMAL HIGH (ref 20.0–28.0)
O2 Saturation: 62.1 %
Patient temperature: 37
pCO2, Ven: 50 mmHg (ref 44.0–60.0)
pH, Ven: 7.38 (ref 7.250–7.430)
pO2, Ven: 33 mmHg (ref 32.0–45.0)

## 2020-06-04 LAB — COMPREHENSIVE METABOLIC PANEL
ALT: 9 U/L (ref 0–44)
AST: 13 U/L — ABNORMAL LOW (ref 15–41)
Albumin: 3.2 g/dL — ABNORMAL LOW (ref 3.5–5.0)
Alkaline Phosphatase: 65 U/L (ref 38–126)
Anion gap: 10 (ref 5–15)
BUN: 20 mg/dL (ref 8–23)
CO2: 26 mmol/L (ref 22–32)
Calcium: 8.2 mg/dL — ABNORMAL LOW (ref 8.9–10.3)
Chloride: 101 mmol/L (ref 98–111)
Creatinine, Ser: 1 mg/dL (ref 0.61–1.24)
GFR, Estimated: >60 mL/min (ref >60)
Glucose, Bld: 119 mg/dL — ABNORMAL HIGH (ref 70–99)
Potassium: 4.1 mmol/L (ref 3.5–5.1)
Sodium: 137 mmol/L (ref 135–145)
Total Bilirubin: 1 mg/dL (ref 0.3–1.2)
Total Protein: 6.5 g/dL (ref 6.5–8.1)

## 2020-06-04 LAB — LIPASE, BLOOD: Lipase: 23 U/L (ref 11–51)

## 2020-06-04 LAB — RESPIRATORY PANEL BY RT PCR (FLU A&B, COVID)
Influenza A by PCR: NEGATIVE
Influenza B by PCR: NEGATIVE
SARS Coronavirus 2 by RT PCR: NEGATIVE

## 2020-06-04 LAB — LACTIC ACID, PLASMA: Lactic Acid, Venous: 1.1 mmol/L (ref 0.5–1.9)

## 2020-06-04 MED ORDER — DOXYCYCLINE HYCLATE 100 MG PO CAPS: 100.0000 mg | ORAL_CAPSULE | Freq: Two times a day (BID) | ORAL | 0 refills | Status: AC

## 2020-06-04 MED ORDER — DIAZEPAM 2 MG PO TABS
2.0000 mg | ORAL_TABLET | Freq: Once | ORAL | Status: AC
  Administered 2020-06-04: 2 mg via ORAL
  Filled 2020-06-04: qty 1

## 2020-06-04 MED ORDER — IOHEXOL 300 MG/ML  SOLN
100.0000 mL | Freq: Once | INTRAMUSCULAR | Status: AC | PRN
  Administered 2020-06-04: 100 mL via INTRAVENOUS

## 2020-06-04 MED ORDER — LACTATED RINGERS IV BOLUS
1000.0000 mL | Freq: Once | INTRAVENOUS | Status: AC
  Administered 2020-06-04: 1000 mL via INTRAVENOUS

## 2020-06-04 NOTE — ED Notes (Signed)
Still awaiting EMS transport back to facility.  Patient lying in bed, he has taken his oxygen off and fights and refuses to put back on, per facility this is what patient was doing earlier in the shift.  Pt also fights and refuses to allow new set of vital signs.  Screams " leave me alone, don't touch me".

## 2020-06-04 NOTE — ED Notes (Signed)
One set of blood cultures sent if needed.

## 2020-06-04 NOTE — Discharge Instructions (Signed)
1 hour after waking up: Mix the entire 238-gram bottle of MiraLAX with 64 ounces of a sports drink. Drink all of the mixture over the next few hours until gone. (Suggestion: An 8-ounce glass every 15-30 minutes equals 2-4 hours.) It is very important to drink plenty of water and other liquids in order to avoid dehydration and to flush the bowel. (Although alcohol is a liquid, it can make you dehydrated. You should NOT drink alcohol while doing the cleanout.) NOTE: Please stay home once you have started your cleanout. Also, the use of moist towelettes or wipes may help to minimize discomfort during the cleanout. A nonprescription 1% hydrocortisone cream may also be soothing when applied to the rectal area after each bowel movement. It is common during the cleanout to experience some nausea, bloating, and/or abdominal distention. If you chilled the mixture prior to drinking it, you could experience chills from consuming so much cold liquid in a short time period. If you develop nausea or vomiting, slow down the rate at which you drink the solution. Please attempt to drink all of the laxative solution even if it takes you longer. Once stooling slows down, you may resume eating solid food.

## 2020-06-04 NOTE — ED Triage Notes (Addendum)
Pt arrived via [facility] from the [facility]. Per EMS pt refusing O2 today, O2 stat at 90%. Per EMS, pt complaint with them and wore [place] at 3L, O2 at 95%  Pt c/o ab pain for 4 days and painful urination   EMS VS - hr 107, 133/75, RR 25, temp 98.8

## 2020-06-04 NOTE — ED Provider Notes (Signed)
[facility] Emergency Department Provider Note  ____________________________________________   First MD Initiated Contact with Patient 06/04/20 1834     (approximate)  I have reviewed the triage vital signs and the nursing notes.   HISTORY  Chief Complaint Altered Mental Status and Abdominal Pain   HPI Eric [name] is a 74 y.o. male with past medical history of chronic proximal respiratory failure on 3 L at baseline, A. fib, CHF, HTN, hypothyroidism, OSA and chronic bilateral lower extremity venous stasis who presents for assessment of several days of abdominal pain.  Per EMS patient is not altered and is not refusing to wear his O2 but was taking it off to his facility because they were not doing anything about his abdominal pain.  Patient is oriented on my initial interview and states he has had a little bit of abdominal pain but denies any other acute symptoms including vomiting, diarrhea, urinary symptoms, back pain, chest pain, cough, shortness of breath, fever, or other acute sick symptoms.  No clear alleviating or aggravating factors.  No prior stenosis.         Past Medical History:  Diagnosis Date  . Acute respiratory failure with hypoxia (HCC)    multiple prior admissions to [facility] and [facility] for CHF exacerbation / respiratory failure  . Anxiety and depression   . Atrial fibrillation (HCC) 07/25/2018   new-onset during [facility] admission in Jan 2020  . Cataract   . CHF (congestive heart failure) (HCC)    EF <= 50% as of Jan 2020  . Current use of long term anticoagulation 07/2018   Started on apixaban for new-onset a-fib in Jan 2020  . Dementia (HCC)    possible, likely age-related, documented decreased cognitive function on prior hospitalizations  . Gallstones   . Glaucoma   . Hypertension   . Hypoglycemia   . Hypothyroidism   . Inguinal hernia   . Sleep apnea   . Thyroid disease     Patient Active Problem List   Diagnosis Date Noted  .  Bilateral lower leg cellulitis 01/12/2020  . Venous stasis ulcer of both lower extremities without varicose veins (HCC) 01/12/2020  . Chronic respiratory failure with hypoxia (HCC) 01/12/2020  . Cellulitis of right lower extremity   . Severe protein-calorie malnutrition (HCC)   . Right foot ulcer (HCC) 11/16/2019  . Anxiety and depression   . Cellulitis of right foot   . 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 Chronic combined systolic and diastolic CHF (congestive heart failure) (HCC) 11/16/2018  . Anasarca 11/16/2018  . Hypothyroidism 11/16/2018  . PAF (paroxysmal atrial fibrillation) (HCC) 11/16/2018  . HTN (hypertension) 11/16/2018  . Generalized anxiety disorder 11/03/2018  . Major depression in partial remission (HCC) 11/03/2018  . Acute hypoxemic respiratory failure (HCC) 10/29/2018  . Suspected COVID-19 virus infection 10/29/2018    Past Surgical History:  Procedure Laterality Date  . FINGER AMPUTATION Right     Prior to Admission medications   Medication Sig Start Date End Date Taking? Authorizing Provider  acetaminophen (TYLENOL) 325 MG tablet Take 2 tablets (650 mg total) by mouth every 6 (six) hours as needed for mild pain (or Fever >/= 101). Patient taking differently: Take 650 mg by mouth every 6 (six) hours as needed for mild pain or headache (fever >/= 101, or minor discomfort).  11/03/18   Alford Highland, MD  albuterol (VENTOLIN HFA) 108 (90 Base) MCG/ACT inhaler Inhale 2 puffs into the lungs  every 6 (six) hours as needed for wheezing or shortness of breath. Patient taking differently: Inhale 1 puff into the lungs every 6 (six) hours as needed for wheezing or shortness of breath.  11/21/19   Ghimire, Werner Lean, MD  alum & mag hydroxide-simeth (ANTACID) 200-200-20 MG/5ML suspension Take 30 mLs by mouth 4 (four) times daily as needed for indigestion or heartburn.    [provider]  ascorbic acid (VITAMIN C) 500 MG tablet  Take 500 mg by mouth 2 (two) times daily.    [provider]  collagenase (SANTYL) ointment Apply topically daily. Apply to right heel ulcer plus dry dressing change daily 11/22/19   Ghimire, Werner Lean, MD  diazepam (VALIUM) 2 MG tablet Take 0.5-1 tablets (1-2 mg total) by mouth See admin instructions. Take 1 mg by mouth in the morning and 2 mg at bedtime 11/21/19   Ghimire, Werner Lean, MD  diphenhydrAMINE (DIPHENHIST) 25 MG tablet Take 25 mg by mouth every 6 (six) hours as needed (for allergic reactions).    [provider]  doxycycline (VIBRAMYCIN) 100 MG capsule Take 1 capsule (100 mg total) by mouth 2 (two) times daily for 10 days. 06/04/20 06/14/20  Gilles Chiquito, MD  furosemide (LASIX) 20 MG tablet Take 2 tablets (40 mg total) by mouth daily. 11/21/19   Ghimire, Werner Lean, MD  guaiFENesin (ROBITUSSIN) 100 MG/5ML liquid Take 300 mg by mouth every 6 (six) hours as needed for cough.    [provider]  levothyroxine (SYNTHROID) 200 MCG tablet Take 1 tablet (200 mcg total) by mouth daily before breakfast. 11/21/19   Ghimire, Werner Lean, MD  liver oil-zinc oxide (DESITIN) 40 % ointment Apply topically daily. Patient taking differently: Apply 1 application topically See admin instructions. Apply to affected area once daily 11/04/18   Alford Highland, MD  loperamide (IMODIUM A-D) 2 MG tablet Take 4 mg by mouth every 3 (three) hours as needed for diarrhea or loose stools (CANNOT EXCEED 8 DOSES/24 HOURS).    [provider]  magnesium hydroxide (MILK OF MAGNESIA) 400 MG/5ML suspension Take 30 mLs by mouth daily as needed for mild constipation.    [provider]  metoCLOPramide (REGLAN) 5 MG tablet Take 5 mg by mouth every 6 (six) hours as needed for nausea or vomiting.    [provider]  Multiple Vitamins-Minerals (THERATRUM COMPLETE) TABS Take 1 tablet by mouth daily with breakfast.    [provider]  OLANZapine zydis (ZYPREXA) 10 MG  disintegrating tablet Take 1 tablet (10 mg total) by mouth 2 (two) times daily. Patient taking differently: Take 15 mg by mouth at bedtime.  11/03/18   Alford Highland, MD  OXYGEN Inhale 2 L/min into the lungs See admin instructions. "2 L/min, as tolerated"    [provider]  sertraline (ZOLOFT) 25 MG tablet Take 1 tablet (25 mg total) by mouth daily. 06/10/19   Charm Rings, NP  Skin Protectants, Misc. (EUCERIN) cream Apply 1 application topically See admin instructions. Apply to both legs 2 times a day    [provider]    Allergies Demerol [meperidine], Epinephrine, Nardil [phenelzine], Morphine and related, Ondansetron, and Shellfish allergy  Family History  Problem Relation Age of Onset  . Alzheimer's disease Mother     Social History Social History   Tobacco Use  . Smoking status: Former Smoker    Quit date: 10/18/2010    Years since quitting: 9.6  . Smokeless tobacco: Never Used  Substance Use Topics  .  Alcohol use: No  . Drug use: No    Review of Systems  Review of Systems  Constitutional: Negative for chills and fever.  HENT: Negative for sore throat.   Eyes: Negative for pain.  Respiratory: Negative for cough and stridor.   Cardiovascular: Negative for chest pain.  Gastrointestinal: Positive for abdominal pain. Negative for vomiting.  Genitourinary: Negative for dysuria.  Musculoskeletal: Negative for myalgias.  Skin: Negative for rash.  Neurological: Negative for seizures, loss of consciousness and headaches.  Psychiatric/Behavioral: Negative for suicidal ideas.  All other systems reviewed and are negative.     ____________________________________________   PHYSICAL EXAM:  VITAL SIGNS: ED Triage Vitals  Enc Vitals Group     BP      Pulse      Resp      Temp      Temp src      SpO2      Weight      Height      Head Circumference      Peak Flow      Pain Score      Pain Loc      Pain Edu?      Excl. in GC?    Vitals:     06/04/20 1834 06/04/20 1843  BP: 118/66   Pulse: (!) 103   Resp: 20   Temp:  99.2 F (37.3 C)  SpO2: 98%    Physical Exam Vitals and nursing note reviewed.  Constitutional:      Appearance: He is well-developed.  HENT:     Head: Normocephalic and atraumatic.     Right Ear: External ear normal.     Left Ear: External ear normal.     Nose: Nose normal.     Mouth/Throat:     Mouth: Mucous membranes are moist.  Eyes:     Conjunctiva/sclera: Conjunctivae normal.  Cardiovascular:     Rate and Rhythm: Normal rate and regular rhythm.     Heart sounds: No murmur heard.   Pulmonary:     Effort: Pulmonary effort is normal. No respiratory distress.     Breath sounds: Normal breath sounds.  Abdominal:     Palpations: Abdomen is soft.     Tenderness: There is generalized abdominal tenderness.  Musculoskeletal:     Cervical back: Neck supple.     Right lower leg: Edema present.     Left lower leg: Edema present.  Skin:    General: Skin is warm and dry.     Capillary Refill: Capillary refill takes less than 2 seconds.  Neurological:     Mental Status: He is alert and oriented to person, place, and time.  Psychiatric:        Mood and Affect: Mood normal.     Bilateral lower extremity erythema. ____________________________________________   LABS (all labs ordered are listed, but only abnormal results are displayed)  Labs Reviewed  CBC WITH DIFFERENTIAL/PLATELET - Abnormal; Notable for the following components:      Result Value   RBC 4.05 (*)    Hemoglobin 12.0 (*)    HCT 37.2 (*)    Neutro Abs 8.7 (*)    Lymphs Abs 0.6 (*)    All other components within normal limits  COMPREHENSIVE METABOLIC PANEL - Abnormal; Notable for the following components:   Glucose, Bld 119 (*)    Calcium 8.2 (*)    Albumin 3.2 (*)    AST 13 (*)    All other components within  normal limits  BLOOD GAS, VENOUS - Abnormal; Notable for the following components:   Bicarbonate 29.6 (*)     Acid-Base Excess 3.5 (*)    All other components within normal limits  RESPIRATORY PANEL BY RT PCR (FLU A&B, COVID)  RESP PANEL BY RT PCR (RSV, FLU A&B, COVID)  LIPASE, BLOOD  URINALYSIS, COMPLETE (UACMP) WITH MICROSCOPIC  LACTIC ACID, PLASMA  LACTIC ACID, PLASMA   ____________________________________________  EKG  Sinus tachycardia with ventricular to 101, normal axis, unremarkable intervals, no evidence of acute ischemia or significant arrhythmia. ____________________________________________  RADIOLOGY  Official radiology report(s): CT ABDOMEN PELVIS W CONTRAST  Result Date: 06/04/2020 CLINICAL DATA:  Abdominal pain EXAM: CT ABDOMEN AND PELVIS WITH CONTRAST TECHNIQUE: Multidetector CT imaging of the abdomen and pelvis was performed using the standard protocol following bolus administration of intravenous contrast. CONTRAST:  OMNIPAQUE IOHEXOL 300 MG/ML  SOLN COMPARISON:  None. FINDINGS: Lower chest: The visualized heart size within normal limits. No pericardial fluid/thickening. No hiatal hernia. The visualized portions of the lungs are clear. Hepatobiliary: The liver is normal in density without focal abnormality.The main portal vein is patent. No evidence of calcified gallstones, gallbladder wall thickening or biliary dilatation. Pancreas: Unremarkable. No pancreatic ductal dilatation or surrounding inflammatory changes. Spleen: Normal in size without focal abnormality. Adrenals/Urinary Tract: Both adrenal glands appear normal. The kidneys and collecting system appear normal without evidence of urinary tract calculus or hydronephrosis. Bladder is unremarkable. Stomach/Bowel: The stomach, small bowel, and colon are normal in appearance. No inflammatory changes, wall thickening, or obstructive findings. There is a moderate amount stool present. There is a small rectal ball present. Vascular/Lymphatic: There are no enlarged mesenteric, retroperitoneal, or pelvic lymph nodes. Scattered  aortic atherosclerotic calcifications are seen without aneurysmal dilatation. Reproductive: The prostate is unremarkable. Other: There is a right anterior abdominal hernia containing a small portion of and small. Musculoskeletal: No acute or significant osseous findings. IMPRESSION: Moderate to large amount colonic stool with a rectal fecal ball. No definite evidence obstruction. 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 Electronically Signed   By: Jonna Clark M.D.   On: 06/04/2020 20:04    ____________________________________________   PROCEDURES  Procedure(s) performed (including Critical Care):  .1-3 Lead EKG Interpretation Performed by: Gilles Chiquito, MD Authorized by: Gilles Chiquito, MD     Interpretation: normal     ECG rate assessment: normal     Rhythm: sinus rhythm     Ectopy: none     Conduction: normal       ____________________________________________   INITIAL IMPRESSION / ASSESSMENT AND PLAN / ED COURSE        Patient presents above to history exam apparently taking his oxygen off as a measure to be taken to the emergency room for assessment of some abdominal pain.  I did attempt to reach staff caring for the patient but no answer on one attempt.  Patient is fully compliant with wearing his oxygen emergency room and states he just once to get his abdomen checked out.  On arrival he is slightly tachycardic with a stable vital signs on room air.  Differential occludes but is not limited to diverticulitis, myositis, SBO, cystitis, pyelonephritis, aortic pathology, pancreatitis, and acute cholecystitis.  CT obtained shows evidence of some constipation with stool ball.  No evidence of SBO, diverticulitis, pyelonephritis, aortic pathology, acute pancreatitis or acute cholecystitis.  CBC is unremarkable.  CMP shows no significant  evidence of any electrolyte or  metabolic derangements.  Lipase is within normal limits at 23 not suggestive of pancreatitis.  Low suspicion for cystitis given patient denies any urinary symptoms.  Covid is negative.  Patient given 1 L of fluids for very mild dehydration.  Discussed with patient concerns that his abdominal pain was related to some constipation and provide written instructions unremarkable cleanout.  Patient discharged stable condition.  Doxy written with concern for possible cellulitis in his lower extremities.  He does have some erythema bilaterally but difficult to exclude cellulitis although chronic venous insufficiency Similar appearance.  Patient otherwise does not appear septic or have evidence of other medial accident process.  Discharged stable condition.        ____________________________________________   FINAL CLINICAL IMPRESSION(S) / ED DIAGNOSES  Final diagnoses:  Cellulitis of lower extremity, unspecified laterality  Constipation, unspecified constipation type  Fecal impaction (HCC)  Dehydration    Medications  lactated ringers bolus 1,000 mL (has no administration in time range)  iohexol (OMNIPAQUE) 300 MG/ML solution 100 mL (100 mLs Intravenous Contrast Given 06/04/20 1937)     ED Discharge Orders         Ordered    doxycycline (VIBRAMYCIN) 100 MG capsule  2 times daily        06/04/20 2037           Note:  This document was prepared using Dragon voice recognition software and may include unintentional dictation errors.   Gilles Chiquito, MD 06/04/20 2039

## 2020-06-04 NOTE — ED Notes (Signed)
EMS called to transport pt back to [facility].

## 2020-06-08 ENCOUNTER — Emergency Department: Payer: Medicare Other

## 2020-06-08 ENCOUNTER — Other Ambulatory Visit: Payer: Self-pay

## 2020-06-08 ENCOUNTER — Inpatient Hospital Stay
Admission: EM | Admit: 2020-06-08 | Discharge: 2020-06-10 | DRG: 603 | Disposition: A | Discharge status: Skilled Nursing Facility | Payer: Medicare Other | Source: Skilled Nursing Facility | Attending: Internal Medicine | Admitting: Internal Medicine

## 2020-06-08 DIAGNOSIS — Z79899 Other long term (current) drug therapy: Secondary | ICD-10-CM | POA: Diagnosis not present

## 2020-06-08 DIAGNOSIS — Z888 Allergy status to other drugs, medicaments and biological substances status: Secondary | ICD-10-CM

## 2020-06-08 DIAGNOSIS — Z89021 Acquired absence of right finger(s): Secondary | ICD-10-CM

## 2020-06-08 DIAGNOSIS — E039 Hypothyroidism, unspecified: Secondary | ICD-10-CM | POA: Diagnosis present

## 2020-06-08 DIAGNOSIS — Z20822 Contact with and (suspected) exposure to covid-19: Secondary | ICD-10-CM | POA: Diagnosis present

## 2020-06-08 DIAGNOSIS — Z87891 Personal history of nicotine dependence: Secondary | ICD-10-CM

## 2020-06-08 DIAGNOSIS — Z79890 Hormone replacement therapy (postmenopausal): Secondary | ICD-10-CM

## 2020-06-08 DIAGNOSIS — J810 Acute pulmonary edema: Secondary | ICD-10-CM | POA: Diagnosis not present

## 2020-06-08 DIAGNOSIS — R400 Somnolence: Secondary | ICD-10-CM | POA: Diagnosis present

## 2020-06-08 DIAGNOSIS — F32A Depression, unspecified: Secondary | ICD-10-CM | POA: Diagnosis present

## 2020-06-08 DIAGNOSIS — L03115 Cellulitis of right lower limb: Secondary | ICD-10-CM | POA: Diagnosis present

## 2020-06-08 DIAGNOSIS — H409 Unspecified glaucoma: Secondary | ICD-10-CM | POA: Diagnosis present

## 2020-06-08 DIAGNOSIS — Z7901 Long term (current) use of anticoagulants: Secondary | ICD-10-CM | POA: Diagnosis not present

## 2020-06-08 DIAGNOSIS — G459 Transient cerebral ischemic attack, unspecified: Secondary | ICD-10-CM

## 2020-06-08 DIAGNOSIS — Z820 Family history of epilepsy and other diseases of the nervous system: Secondary | ICD-10-CM | POA: Diagnosis not present

## 2020-06-08 DIAGNOSIS — I5032 Chronic diastolic (congestive) heart failure: Secondary | ICD-10-CM | POA: Diagnosis present

## 2020-06-08 DIAGNOSIS — F419 Anxiety disorder, unspecified: Secondary | ICD-10-CM | POA: Diagnosis present

## 2020-06-08 DIAGNOSIS — B9562 Methicillin resistant Staphylococcus aureus infection as the cause of diseases classified elsewhere: Secondary | ICD-10-CM | POA: Diagnosis present

## 2020-06-08 DIAGNOSIS — Z91013 Allergy to seafood: Secondary | ICD-10-CM | POA: Diagnosis not present

## 2020-06-08 DIAGNOSIS — I4891 Unspecified atrial fibrillation: Secondary | ICD-10-CM | POA: Diagnosis present

## 2020-06-08 DIAGNOSIS — F0151 Vascular dementia with behavioral disturbance: Secondary | ICD-10-CM

## 2020-06-08 DIAGNOSIS — Z885 Allergy status to narcotic agent status: Secondary | ICD-10-CM

## 2020-06-08 DIAGNOSIS — R609 Edema, unspecified: Secondary | ICD-10-CM

## 2020-06-08 DIAGNOSIS — I110 Hypertensive heart disease with heart failure: Secondary | ICD-10-CM | POA: Diagnosis present

## 2020-06-08 DIAGNOSIS — F0391 Unspecified dementia with behavioral disturbance: Secondary | ICD-10-CM | POA: Diagnosis present

## 2020-06-08 LAB — CBC WITH DIFFERENTIAL/PLATELET
Abs Immature Granulocytes: 0.05 10*3/uL (ref 0.00–0.07)
Basophils Absolute: 0 10*3/uL (ref 0.0–0.1)
Basophils Relative: 0 %
Eosinophils Absolute: 0.2 10*3/uL (ref 0.0–0.5)
Eosinophils Relative: 2 %
HCT: 37.5 % — ABNORMAL LOW (ref 39.0–52.0)
Hemoglobin: 11.8 g/dL — ABNORMAL LOW (ref 13.0–17.0)
Immature Granulocytes: 1 %
Lymphocytes Relative: 7 %
Lymphs Abs: 0.6 10*3/uL — ABNORMAL LOW (ref 0.7–4.0)
MCH: 29.2 pg (ref 26.0–34.0)
MCHC: 31.5 g/dL (ref 30.0–36.0)
MCV: 92.8 fL (ref 80.0–100.0)
Monocytes Absolute: 0.8 10*3/uL (ref 0.1–1.0)
Monocytes Relative: 8 %
Neutro Abs: 8 10*3/uL — ABNORMAL HIGH (ref 1.7–7.7)
Neutrophils Relative %: 82 %
Platelets: 207 10*3/uL (ref 150–400)
RBC: 4.04 MIL/uL — ABNORMAL LOW (ref 4.22–5.81)
RDW: 13.6 % (ref 11.5–15.5)
WBC: 9.7 10*3/uL (ref 4.0–10.5)
nRBC: 0.2 % (ref 0.0–0.2)

## 2020-06-08 LAB — RESP PANEL BY RT-PCR (FLU A&B, COVID) ARPGX2
Influenza A by PCR: NEGATIVE
Influenza B by PCR: NEGATIVE
SARS Coronavirus 2 by RT PCR: NEGATIVE

## 2020-06-08 LAB — COMPREHENSIVE METABOLIC PANEL
ALT: 8 U/L (ref 0–44)
AST: 13 U/L — ABNORMAL LOW (ref 15–41)
Albumin: 3.1 g/dL — ABNORMAL LOW (ref 3.5–5.0)
Alkaline Phosphatase: 64 U/L (ref 38–126)
Anion gap: 10 (ref 5–15)
BUN: 20 mg/dL (ref 8–23)
CO2: 26 mmol/L (ref 22–32)
Calcium: 7.9 mg/dL — ABNORMAL LOW (ref 8.9–10.3)
Chloride: 104 mmol/L (ref 98–111)
Creatinine, Ser: 0.72 mg/dL (ref 0.61–1.24)
GFR, Estimated: >60 mL/min (ref >60)
Glucose, Bld: 135 mg/dL — ABNORMAL HIGH (ref 70–99)
Potassium: 4.2 mmol/L (ref 3.5–5.1)
Sodium: 140 mmol/L (ref 135–145)
Total Bilirubin: 0.5 mg/dL (ref 0.3–1.2)
Total Protein: 6.4 g/dL — ABNORMAL LOW (ref 6.5–8.1)

## 2020-06-08 LAB — PROTIME-INR
INR: 1.1 (ref 0.8–1.2)
Prothrombin Time: 13.3 seconds (ref 11.4–15.2)

## 2020-06-08 LAB — LACTIC ACID, PLASMA: Lactic Acid, Venous: 1.4 mmol/L (ref 0.5–1.9)

## 2020-06-08 LAB — BRAIN NATRIURETIC PEPTIDE: B Natriuretic Peptide: 18.4 pg/mL (ref 0.0–100.0)

## 2020-06-08 LAB — TROPONIN I (HIGH SENSITIVITY): Troponin I (High Sensitivity): 3 ng/L (ref <18)

## 2020-06-08 MED ORDER — FUROSEMIDE 10 MG/ML IJ SOLN
60.0000 mg | Freq: Once | INTRAMUSCULAR | Status: AC
  Administered 2020-06-08: 60 mg via INTRAVENOUS
  Filled 2020-06-08: qty 8

## 2020-06-08 MED ORDER — SODIUM CHLORIDE 0.9 % IV SOLN
2.0000 g | INTRAVENOUS | Status: DC
  Administered 2020-06-08 – 2020-06-09 (×2): 2 g via INTRAVENOUS
  Filled 2020-06-08 (×3): qty 20

## 2020-06-08 NOTE — ED Notes (Addendum)
Pt with right leg redness, swelling, clear to purulent weeping noted. Pt states leg is painful to touch. Leg hot to touch.  Pt also has left leg swelling noted. Pt is able to move all toes. Pt with unstagable skin breakdown noted to sacrum and unstagable skin breakdown noted to heels. Pt states he sits in  A wheelchair at the [facility] and does not ambulate. Pt is very agitated with care and states staff are torturing him with blood draw and assessment. Pt declines application of oxygen at this time.

## 2020-06-08 NOTE — ED Notes (Signed)
Dr. Arville Care in to assess pt.

## 2020-06-08 NOTE — ED Notes (Signed)
Ultrasound in progress  

## 2020-06-08 NOTE — ED Notes (Signed)
Dr. Lenard Lance notified pt is possible sepsis pt.

## 2020-06-08 NOTE — ED Provider Notes (Addendum)
[facility] Emergency Department Provider Note  Time seen: 10:37 PM  I have reviewed the triage vital signs and the nursing notes.   HISTORY  Chief Complaint leg redness   HPI Eric [name] is a 74 y.o. male with a past medical history of atrial fibrillation, CHF, on anticoagulation, dementia, hypertension, presents to the emergency department from his nursing facility for redness weeping and pain to his right lower extremity.  EMS got an oral temperature of 100.0.  Patient is unable to provide significant history, is not sure why he is here.   Past Medical History:  Diagnosis Date  . Acute respiratory failure with hypoxia (HCC)    multiple prior admissions to [facility] and [facility] for CHF exacerbation / respiratory failure  . Anxiety and depression   . Atrial fibrillation (HCC) 07/25/2018   new-onset during [facility] admission in Jan 2020  . Cataract   . CHF (congestive heart failure) (HCC)    EF <= 50% as of Jan 2020  . Current use of long term anticoagulation 07/2018   Started on apixaban for new-onset a-fib in Jan 2020  . Dementia (HCC)    possible, likely age-related, documented decreased cognitive function on prior hospitalizations  . Gallstones   . Glaucoma   . Hypertension   . Hypoglycemia   . Hypothyroidism   . Inguinal hernia   . Sleep apnea   . Thyroid disease     Patient Active Problem List   Diagnosis Date Noted  . Bilateral lower leg cellulitis 01/12/2020  . Venous stasis ulcer of both lower extremities without varicose veins (HCC) 01/12/2020  . Chronic respiratory failure with hypoxia (HCC) 01/12/2020  . Cellulitis of right lower extremity   . Severe protein-calorie malnutrition (HCC)   . Right foot ulcer (HCC) 11/16/2019  . Anxiety and depression   . Cellulitis of right foot   . 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 Chronic combined systolic and diastolic CHF (congestive heart failure) (HCC)  11/16/2018  . Anasarca 11/16/2018  . Hypothyroidism 11/16/2018  . PAF (paroxysmal atrial fibrillation) (HCC) 11/16/2018  . HTN (hypertension) 11/16/2018  . Generalized anxiety disorder 11/03/2018  . Major depression in partial remission (HCC) 11/03/2018  . Acute hypoxemic respiratory failure (HCC) 10/29/2018  . Suspected COVID-19 virus infection 10/29/2018    Past Surgical History:  Procedure Laterality Date  . FINGER AMPUTATION Right     Prior to Admission medications   Medication Sig Start Date End Date Taking? Authorizing Provider  acetaminophen (TYLENOL) 325 MG tablet Take 2 tablets (650 mg total) by mouth every 6 (six) hours as needed for mild pain (or Fever >/= 101). Patient taking differently: Take 650 mg by mouth every 6 (six) hours as needed for mild pain or headache (fever >/= 101, or minor discomfort).  11/03/18  Yes Wieting, Richard, MD  albuterol (VENTOLIN HFA) 108 (90 Base) MCG/ACT inhaler Inhale 2 puffs into the lungs every 6 (six) hours as needed for wheezing or shortness of breath. Patient taking differently: Inhale 1 puff into the lungs every 6 (six) hours as needed for wheezing or shortness of breath.  11/21/19  Yes Ghimire, Werner Lean, MD  alum & mag hydroxide-simeth (ANTACID) 200-200-20 MG/5ML suspension Take 30 mLs by mouth 4 (four) times daily as needed for indigestion or heartburn.   Yes [provider]  collagenase (SANTYL) ointment Apply topically daily. Apply to right heel ulcer plus dry dressing change daily 11/22/19  Yes Ghimire, [employer]  M, MD  diazepam (VALIUM) 2 MG tablet Take 0.5-1 tablets (1-2 mg total) by mouth See admin instructions. Take 1 mg by mouth in the morning and 2 mg at bedtime 11/21/19  Yes Ghimire, Werner Lean, MD  diphenhydrAMINE (DIPHENHIST) 25 MG tablet Take 25 mg by mouth every 6 (six) hours as needed (for allergic reactions).   Yes [provider]  doxycycline (VIBRAMYCIN) 100 MG capsule Take 1 capsule (100 mg total) by mouth 2  (two) times daily for 10 days. 06/04/20 06/14/20 Yes Gilles Chiquito, MD  furosemide (LASIX) 20 MG tablet Take 2 tablets (40 mg total) by mouth daily. 11/21/19  Yes Ghimire, Werner Lean, MD  guaiFENesin (ROBITUSSIN) 100 MG/5ML liquid Take 300 mg by mouth every 6 (six) hours as needed for cough.   Yes [provider]  levothyroxine (SYNTHROID) 200 MCG tablet Take 1 tablet (200 mcg total) by mouth daily before breakfast. 11/21/19  Yes Ghimire, Werner Lean, MD  lidocaine (XYLOCAINE) 1 % (with preservative) injection by Other route once. Used to mix with rocephin   Yes [provider]  liver oil-zinc oxide (DESITIN) 40 % ointment Apply topically daily. Patient taking differently: Apply 1 application topically See admin instructions. Apply to affected area once daily 11/04/18  Yes Wieting, Richard, MD  loperamide (IMODIUM A-D) 2 MG tablet Take 4 mg by mouth every 3 (three) hours as needed for diarrhea or loose stools (CANNOT EXCEED 8 DOSES/24 HOURS).   Yes [provider]  magnesium hydroxide (MILK OF MAGNESIA) 400 MG/5ML suspension Take 30 mLs by mouth daily as needed for mild constipation.   Yes [provider]  metoCLOPramide (REGLAN) 5 MG tablet Take 5 mg by mouth every 6 (six) hours as needed for nausea or vomiting.   Yes [provider]  Multiple Vitamins-Minerals (THERATRUM COMPLETE) TABS Take 1 tablet by mouth daily with breakfast.   Yes [provider]  nystatin cream (MYCOSTATIN) Apply 1 application topically 2 (two) times a week.   Yes [provider]  OLANZapine zydis (ZYPREXA) 10 MG disintegrating tablet Take 1 tablet (10 mg total) by mouth 2 (two) times daily. Patient taking differently: Take 15 mg by mouth at bedtime.  11/03/18  Yes Wieting, Richard, MD  OXYGEN Inhale 2 L/min into the lungs See admin instructions. "2 L/min, as tolerated"   Yes [provider]  sertraline (ZOLOFT) 25 MG tablet Take 1 tablet (25 mg total) by mouth  daily. 06/10/19  Yes Charm Rings, NP  ascorbic acid (VITAMIN C) 500 MG tablet Take 500 mg by mouth 2 (two) times daily. Patient not taking: Reported on 06/08/2020    [provider]  Skin Protectants, Misc. (EUCERIN) cream Apply 1 application topically See admin instructions. Apply to both legs 2 times a day Patient not taking: Reported on 06/08/2020    [provider]    Allergies  Allergen Reactions  . Demerol [Meperidine] Other (See Comments)    Will counteract with a drug he is taking causing fatal reaction with nardil  . Epinephrine Other (See Comments)    "Nardil reaction-With epi?" "Allergic," per MAR  . Nardil [Phenelzine] Other (See Comments)    "Allergic," per MAR  . Morphine And Related Other (See Comments)    Reacts with nardil- "Allergic," per MAR  . Ondansetron Other (See Comments)    Made patient want to climb the walls.  . Shellfish Allergy Hives    Family History  Problem Relation Age of Onset  . Alzheimer's  disease Mother     Social History Social History   Tobacco Use  . Smoking status: Former Smoker    Quit date: 10/18/2010    Years since quitting: 9.6  . Smokeless tobacco: Never Used  Substance Use Topics  . Alcohol use: No  . Drug use: No    Review of Systems Unable to obtain adequate/accurate review of systems secondary to baseline dementia. ____________________________________________   PHYSICAL EXAM:  VITAL SIGNS: ED Triage Vitals  Enc Vitals Group     BP 06/08/20 2158 (!) 145/86     Pulse Rate 06/08/20 2158 (!) 105     Resp 06/08/20 2158 (!) 22     Temp 06/08/20 2205 98.9 F (37.2 C)     Temp Source 06/08/20 2158 Rectal     SpO2 06/08/20 2158 94 %     Weight 06/08/20 2159 240 lb (108.9 kg)     Height 06/08/20 2159 5\' 7"  (1.702 m)     Head Circumference --      Peak Flow --      Pain Score --      Pain Loc --      Pain Edu? --      Excl. in GC? --    Constitutional: Patient is awake and alert.  No acute  distress. Eyes: Normal exam ENT      Head: Normocephalic and atraumatic.      Mouth/Throat: Mucous membranes are moist. Cardiovascular: Normal rate, regular rhythm around 100 bpm. Respiratory: Mild tachypnea.  No obvious wheeze rales or rhonchi. Gastrointestinal: Soft and nontender. No distention.   Musculoskeletal: Patient has moderate edema to bilateral lower extremities 1+ in the left 2-+ in the right.  In the right leg he has significant erythema with mild tenderness to palpation and weeping of the right lower extremity. Neurologic:  Normal speech and language. No gross focal neurologic deficits  Skin:  Skin is warm, dry and intact.  Psychiatric: Mood and affect are normal.   ____________________________________________    EKG  EKG viewed and interpreted by myself shows a sinus rhythm 89 bpm with a narrow QRS, normal axis, normal intervals, logical interference from movement but no ST elevation noted.  Do not agree with computer read.  ____________________________________________    RADIOLOGY  Ultrasound pending Chest x-ray pending  ____________________________________________   INITIAL IMPRESSION / ASSESSMENT AND PLAN / ED COURSE  Pertinent labs & imaging results that were available during my care of the patient were reviewed by me and considered in my medical decision making (see chart for details).   Patient presents to the emergency department for evaluation of right leg redness swelling and weeping.  Patient noted to be mildly tachypneic satting 90% during my evaluation.  Does have lower extreme edema right greater than left with right-sided erythema and weeping.  Patient is a very poor historian is not able to contribute in a meaningful manner to his history or review of systems at this time.  Differential would include CHF exacerbation, peripheral edema, DVT, cellulitis.  We will check labs, ultrasound of the right lower extremity rule out DVT.  Patient satting 90 to 92%  on room air given his peripheral edema and low oxygen saturation we will obtain a chest x-ray labs including cardiac enzymes and a BNP to evaluate for possible CHF exacerbation/pulmonary edema.  We will also check a Covid swab as a precaution.  At a minimum I believe the patient is suffering from cellulitis of the right lower extremity in  addition to peripheral edema.  We will cover with antibiotics and send cultures as a precaution.  Patient's lab work is largely within normal limits lactate 1.4 normal white blood cell count.  However given his peripheral edema and chest x-ray that appears consistent with CHF exacerbation we will admit for IV antibiotics for likely right lower extremity cellulitis in addition to CHF exacerbation.  We will dose IV Lasix.  Ultrasound pending.  Eric [name] was evaluated in Emergency Department on 06/08/2020 for the symptoms described in the history of present illness. He was evaluated in the context of the global COVID-19 pandemic, which necessitated consideration that the patient might be at risk for infection with the SARS-CoV-2 virus that causes COVID-19. Institutional protocols and algorithms that pertain to the evaluation of patients at risk for COVID-19 are in a state of rapid change based on information released by regulatory bodies including the CDC and federal and state organizations. These policies and algorithms were followed during the patient's care in the ED.  ____________________________________________   FINAL CLINICAL IMPRESSION(S) / ED DIAGNOSES  Peripheral edema Right lower extremity cellulitis    Minna AntisPaduchowski, Alto Gandolfo, MD 06/08/20 2257    Minna AntisPaduchowski, Ajani Rineer, MD 06/08/20 2306

## 2020-06-08 NOTE — Progress Notes (Signed)
CODE SEPSIS - PHARMACY COMMUNICATION  **Broad Spectrum Antibiotics should be administered within 1 hour of Sepsis diagnosis**  Time Code Sepsis Called/Page Received:  11/21 @ 2244  Antibiotics Ordered: Ceftriaxone 2 gm IV X 1   Time of 1st antibiotic administration: 11/21 @ 2256  Additional action taken by pharmacy:   If necessary, Name of Provider/Nurse Contacted:     Jenine Krisher D ,PharmD Clinical Pharmacist  06/08/2020  11:15 PM

## 2020-06-08 NOTE — ED Triage Notes (Signed)
Pt with right leg redness, weeping and pain that began sometime this week. Per ems pt with temp of 100.0 orally. Right leg is weeping clear to yellow fluid, hot to touch.

## 2020-06-08 NOTE — ED Notes (Signed)
Pt placed on 2lpm oxygen via nasal cannula.

## 2020-06-09 ENCOUNTER — Other Ambulatory Visit: Payer: Self-pay

## 2020-06-09 ENCOUNTER — Encounter: Payer: Self-pay | Admitting: Family Medicine

## 2020-06-09 ENCOUNTER — Inpatient Hospital Stay: Admit: 2020-06-09 | Payer: Medicare Other

## 2020-06-09 DIAGNOSIS — L03115 Cellulitis of right lower limb: Secondary | ICD-10-CM | POA: Diagnosis not present

## 2020-06-09 LAB — HEMOGLOBIN A1C
Hgb A1c MFr Bld: 5.2 % (ref 4.8–5.6)
Mean Plasma Glucose: 102.54 mg/dL

## 2020-06-09 LAB — CBC
HCT: 39.9 % (ref 39.0–52.0)
Hemoglobin: 12.7 g/dL — ABNORMAL LOW (ref 13.0–17.0)
MCH: 29.1 pg (ref 26.0–34.0)
MCHC: 31.8 g/dL (ref 30.0–36.0)
MCV: 91.5 fL (ref 80.0–100.0)
Platelets: 213 10*3/uL (ref 150–400)
RBC: 4.36 MIL/uL (ref 4.22–5.81)
RDW: 13.6 % (ref 11.5–15.5)
WBC: 10.8 10*3/uL — ABNORMAL HIGH (ref 4.0–10.5)
nRBC: 0 % (ref 0.0–0.2)

## 2020-06-09 LAB — LIPID PANEL
Cholesterol: 128 mg/dL (ref 0–200)
HDL: 49 mg/dL (ref >40)
LDL Cholesterol: 71 mg/dL (ref 0–99)
Total CHOL/HDL Ratio: 2.6 RATIO
Triglycerides: 38 mg/dL (ref <150)
VLDL: 8 mg/dL (ref 0–40)

## 2020-06-09 LAB — TROPONIN I (HIGH SENSITIVITY)
Troponin I (High Sensitivity): 4 ng/L (ref <18)
Troponin I (High Sensitivity): 4 ng/L (ref <18)

## 2020-06-09 LAB — TSH: TSH: 3.69 u[IU]/mL (ref 0.350–4.500)

## 2020-06-09 LAB — MAGNESIUM: Magnesium: 2.1 mg/dL (ref 1.7–2.4)

## 2020-06-09 LAB — MRSA PCR SCREENING: MRSA by PCR: POSITIVE — AB

## 2020-06-09 MED ORDER — KETOROLAC TROMETHAMINE 15 MG/ML IJ SOLN
15.0000 mg | Freq: Four times a day (QID) | INTRAMUSCULAR | Status: DC | PRN
  Administered 2020-06-09: 15 mg via INTRAVENOUS
  Filled 2020-06-09: qty 1

## 2020-06-09 MED ORDER — NYSTATIN 100000 UNIT/GM EX CREA
1.0000 "application " | TOPICAL_CREAM | CUTANEOUS | Status: DC
  Filled 2020-06-09: qty 15

## 2020-06-09 MED ORDER — MUPIROCIN 2 % EX OINT
1.0000 "application " | TOPICAL_OINTMENT | Freq: Two times a day (BID) | CUTANEOUS | Status: DC
  Filled 2020-06-09: qty 22

## 2020-06-09 MED ORDER — SENNOSIDES-DOCUSATE SODIUM 8.6-50 MG PO TABS: 1.0000 | ORAL_TABLET | Freq: Every evening | ORAL | Status: DC | PRN

## 2020-06-09 MED ORDER — LEVOTHYROXINE SODIUM 50 MCG PO TABS
200.0000 ug | ORAL_TABLET | Freq: Every day | ORAL | Status: DC
  Filled 2020-06-09: qty 4

## 2020-06-09 MED ORDER — LORAZEPAM 2 MG/ML IJ SOLN
INTRAMUSCULAR | Status: AC
  Filled 2020-06-09: qty 1

## 2020-06-09 MED ORDER — DIAZEPAM 2 MG PO TABS: 2.0000 mg | ORAL_TABLET | Freq: Every day | ORAL | Status: DC

## 2020-06-09 MED ORDER — MAGNESIUM HYDROXIDE 400 MG/5ML PO SUSP: 30.0000 mL | Freq: Every day | ORAL | Status: DC | PRN

## 2020-06-09 MED ORDER — ACETAMINOPHEN 325 MG PO TABS: 650.0000 mg | ORAL_TABLET | ORAL | Status: DC | PRN

## 2020-06-09 MED ORDER — ACETAMINOPHEN 160 MG/5ML PO SOLN
650.0000 mg | ORAL | Status: DC | PRN
  Filled 2020-06-09: qty 20.3

## 2020-06-09 MED ORDER — FUROSEMIDE 10 MG/ML IJ SOLN: 40.0000 mg | Freq: Two times a day (BID) | INTRAMUSCULAR | Status: DC

## 2020-06-09 MED ORDER — LOPERAMIDE HCL 2 MG PO CAPS
4.0000 mg | ORAL_CAPSULE | ORAL | Status: DC | PRN
  Filled 2020-06-09: qty 2

## 2020-06-09 MED ORDER — LORAZEPAM 2 MG/ML IJ SOLN
0.5000 mg | INTRAMUSCULAR | Status: DC | PRN
  Administered 2020-06-09 – 2020-06-10 (×2): 0.5 mg via INTRAVENOUS
  Filled 2020-06-09 (×2): qty 1

## 2020-06-09 MED ORDER — ASPIRIN 300 MG RE SUPP
300.0000 mg | Freq: Every day | RECTAL | Status: DC
  Filled 2020-06-09: qty 1

## 2020-06-09 MED ORDER — ACETAMINOPHEN 650 MG RE SUPP: 650.0000 mg | RECTAL | Status: DC | PRN

## 2020-06-09 MED ORDER — TRAZODONE HCL 50 MG PO TABS: 25.0000 mg | ORAL_TABLET | Freq: Every evening | ORAL | Status: DC | PRN

## 2020-06-09 MED ORDER — ZINC OXIDE 40 % EX OINT
1.0000 "application " | TOPICAL_OINTMENT | Freq: Every day | CUTANEOUS | Status: DC
  Filled 2020-06-09: qty 113

## 2020-06-09 MED ORDER — SODIUM CHLORIDE 0.9 % IV SOLN: INTRAVENOUS | Status: DC

## 2020-06-09 MED ORDER — DIAZEPAM 2 MG PO TABS
1.0000 mg | ORAL_TABLET | Freq: Every morning | ORAL | Status: DC
  Filled 2020-06-09 (×2): qty 1

## 2020-06-09 MED ORDER — CHLORHEXIDINE GLUCONATE CLOTH 2 % EX PADS: 6.0000 | MEDICATED_PAD | Freq: Every day | CUTANEOUS | Status: DC

## 2020-06-09 MED ORDER — ACETAMINOPHEN 325 MG PO TABS: 650.0000 mg | ORAL_TABLET | Freq: Four times a day (QID) | ORAL | Status: DC | PRN

## 2020-06-09 MED ORDER — FUROSEMIDE 10 MG/ML IJ SOLN
40.0000 mg | Freq: Two times a day (BID) | INTRAMUSCULAR | Status: DC
  Administered 2020-06-09 (×2): 40 mg via INTRAVENOUS
  Filled 2020-06-09 (×3): qty 4

## 2020-06-09 MED ORDER — ADULT MULTIVITAMIN W/MINERALS CH
1.0000 | ORAL_TABLET | Freq: Every day | ORAL | Status: DC
  Filled 2020-06-09: qty 1

## 2020-06-09 MED ORDER — ENOXAPARIN SODIUM 60 MG/0.6ML ~~LOC~~ SOLN
55.0000 mg | SUBCUTANEOUS | Status: DC
  Filled 2020-06-09: qty 0.6

## 2020-06-09 MED ORDER — STROKE: EARLY STAGES OF RECOVERY BOOK: Freq: Once | Status: DC

## 2020-06-09 MED ORDER — ALBUTEROL SULFATE (2.5 MG/3ML) 0.083% IN NEBU: 2.5000 mg | INHALATION_SOLUTION | Freq: Four times a day (QID) | RESPIRATORY_TRACT | Status: DC | PRN

## 2020-06-09 MED ORDER — OLANZAPINE 5 MG PO TBDP
15.0000 mg | ORAL_TABLET | Freq: Every day | ORAL | Status: DC
  Filled 2020-06-09 (×3): qty 1

## 2020-06-09 MED ORDER — ENOXAPARIN SODIUM 60 MG/0.6ML ~~LOC~~ SOLN: 55.0000 mg | SUBCUTANEOUS | Status: DC

## 2020-06-09 MED ORDER — ASPIRIN 325 MG PO TABS
325.0000 mg | ORAL_TABLET | Freq: Every day | ORAL | Status: DC
  Filled 2020-06-09: qty 1

## 2020-06-09 MED ORDER — ALUM & MAG HYDROXIDE-SIMETH 200-200-20 MG/5ML PO SUSP: 30.0000 mL | Freq: Four times a day (QID) | ORAL | Status: DC | PRN

## 2020-06-09 MED ORDER — METOCLOPRAMIDE HCL 10 MG PO TABS: 5.0000 mg | ORAL_TABLET | Freq: Four times a day (QID) | ORAL | Status: DC | PRN

## 2020-06-09 MED ORDER — ACETAMINOPHEN 650 MG RE SUPP: 650.0000 mg | Freq: Four times a day (QID) | RECTAL | Status: DC | PRN

## 2020-06-09 MED ORDER — DIPHENHYDRAMINE HCL 25 MG PO CAPS
25.0000 mg | ORAL_CAPSULE | Freq: Four times a day (QID) | ORAL | Status: DC | PRN
  Filled 2020-06-09: qty 1

## 2020-06-09 MED ORDER — SERTRALINE HCL 50 MG PO TABS
25.0000 mg | ORAL_TABLET | Freq: Every day | ORAL | Status: DC
  Filled 2020-06-09: qty 1

## 2020-06-09 MED ORDER — GUAIFENESIN 100 MG/5ML PO SOLN
300.0000 mg | Freq: Four times a day (QID) | ORAL | Status: DC | PRN
  Filled 2020-06-09: qty 15

## 2020-06-09 MED ORDER — SODIUM CHLORIDE 0.9 % IV SOLN: 2.0000 g | INTRAVENOUS | Status: DC

## 2020-06-09 NOTE — Progress Notes (Signed)
Received report from April. RN. Patient arrived on unit.

## 2020-06-09 NOTE — Therapy (Signed)
Chart reviewed, Pt visited. Received order for cognitive eval. Pt admitted for leg cellulitis. Pt was  lethargic upon ST entering the room but awakened easily. Cognitive deficits are noted in chart at baseline with some dementia. Noted tray at bedside untouched. Pt refused to try PO's with ST when attempting to screen swallowing, also noted to still be lethargic needing cues and repetition to answer questions.. Will D/C order for speech and cognitive eval. If any noted swallowing deficits, please reconsult for swallow eval.

## 2020-06-09 NOTE — Plan of Care (Signed)
  Problem: Education: Goal: Knowledge of General Education information will improve Description: Including pain rating scale, medication(s)/side effects and non-pharmacologic comfort measures 06/09/2020 0244 by Margie Ege, RN Outcome: Progressing 06/09/2020 0224 by Margie Ege, RN Outcome: Progressing   Problem: Health Behavior/Discharge Planning: Goal: Ability to manage health-related needs will improve 06/09/2020 0244 by Margie Ege, RN Outcome: Progressing 06/09/2020 0224 by Margie Ege, RN Outcome: Progressing   Problem: Clinical Measurements: Goal: Ability to maintain clinical measurements within normal limits will improve 06/09/2020 0244 by Margie Ege, RN Outcome: Progressing 06/09/2020 0224 by Margie Ege, RN Outcome: Progressing Goal: Will remain free from infection 06/09/2020 0244 by Margie Ege, RN Outcome: Progressing 06/09/2020 0224 by Margie Ege, RN Outcome: Progressing Goal: Diagnostic test results will improve 06/09/2020 0244 by Margie Ege, RN Outcome: Progressing 06/09/2020 0224 by Margie Ege, RN Outcome: Progressing Goal: Respiratory complications will improve 06/09/2020 0244 by Margie Ege, RN Outcome: Progressing 06/09/2020 0224 by Margie Ege, RN Outcome: Progressing Goal: Cardiovascular complication will be avoided 06/09/2020 0244 by Margie Ege, RN Outcome: Progressing 06/09/2020 0224 by Margie Ege, RN Outcome: Progressing

## 2020-06-09 NOTE — Progress Notes (Signed)
OT Cancellation Note  Patient Details Name: Eric [name] MRN: [id number] DOB: Mar 13, 1946   Cancelled Treatment:    Reason Eval/Treat Not Completed: Other (comment)  OT consult received and chart reviewed. Upon OT presenting to room, pt attempting to crawl over the bed rails/get OOB on his own. RN and RN orientee present in room. Nursing team administered sedative medication to patient to help him calm down as he is also yelling and flailing. Will hold OT at this time as pt is unable to follow commands on an appropriate level and is also demonstrating unsafe behaviors. Will f/u at later date/time as able/appropriate for OT evaluation. Thank you.  Rejeana Brock, MS, OTR/L ascom [phone] 06/09/20, 2:40 PM

## 2020-06-09 NOTE — Plan of Care (Signed)

## 2020-06-09 NOTE — H&P (Signed)
[place]   PATIENT NAME: Eric [name]    MR#:  [id number]  DATE OF BIRTH:  05-23-1946  DATE OF ADMISSION:  06/08/2020  PRIMARY CARE PHYSICIAN: [facility]   REQUESTING/REFERRING PHYSICIAN: Antoine Primas, MD  CHIEF COMPLAINT:   Chief Complaint  Patient presents with  . leg redness    HISTORY OF PRESENT ILLNESS:  Eric [name]  is a 74 y.o. Caucasian male, coming from the [facility] skilled nursing facility with a known history of multiple medical problems that are mentioned below including hypothyroidism, hypertension, CHF, dementia, atrial fibrillation, and anxiety and depression, presented to the emergency room with acute onset of right lower extremity worsening redness and swelling. The patient is a nonhistorian due to his dementia. He did not have any reported fever or chills. No reported nausea or vomiting or abdominal pain. He denied any chest pain or dyspnea or cough or wheezing. No dysuria, oliguria or hematuria or flank pain.  On presentation to the emergency room, blood pressure was 145/81 with a pulse of 107 with otherwise normal vital signs. Labs revealed mild anemia on his CBC and a CMP was unremarkable. Influenza antigens and COVID-19 PCR came back negative. TSH was 3.69. Two blood cultures were drawn. Venous Doppler of the right lower extremity came back negative for DVT. Chest x-ray showed cardiomegaly with asymmetric hazy opacity within the left lung favoring to reflect asymmetric pulmonary interstitial edema that could in part be accentuated by patient rotation.  The patient was given 60 mg of IV Lasix and 2 g of IV Rocephin. He will be admitted to a medical monitored bed for further evaluation and management.  PAST MEDICAL HISTORY:   Past Medical History:  Diagnosis Date  . Acute respiratory failure with hypoxia (HCC)    multiple prior admissions to [facility] and [facility] for CHF exacerbation / respiratory failure  . Anxiety and depression   . Atrial  fibrillation (HCC) 07/25/2018   new-onset during [facility] admission in Anav Lammert 2020  . Cataract   . CHF (congestive heart failure) (HCC)    EF <= 50% as of Kyera Felan 2020  . Current use of long term anticoagulation 07/2018   Started on apixaban for new-onset a-fib in [place] 2020  . Dementia (HCC)    possible, likely age-related, documented decreased cognitive function on prior hospitalizations  . Gallstones   . Glaucoma   . Hypertension   . Hypoglycemia   . Hypothyroidism   . Inguinal hernia   . Sleep apnea   . Thyroid disease     PAST SURGICAL HISTORY:   Past Surgical History:  Procedure Laterality Date  . FINGER AMPUTATION Right     SOCIAL HISTORY:   Social History   Tobacco Use  . Smoking status: Former Smoker    Quit date: 10/18/2010    Years since quitting: 9.6  . Smokeless tobacco: Never Used  Substance Use Topics  . Alcohol use: No    FAMILY HISTORY:   Family History  Problem Relation Age of Onset  . Alzheimer's disease Mother     DRUG ALLERGIES:   Allergies  Allergen Reactions  . Demerol [Meperidine] Other (See Comments)    Will counteract with a drug he is taking causing fatal reaction with nardil  . Epinephrine Other (See Comments)    "Nardil reaction-With epi?" "Allergic," per MAR  . Nardil [Phenelzine] Other (See Comments)    "Allergic," per MAR  . Morphine And Related Other (See Comments)    Reacts  with nardil- "Allergic," per MAR  . Ondansetron Other (See Comments)    Made patient want to climb the walls.  . Shellfish Allergy Hives    REVIEW OF SYSTEMS:   ROS As per history of present illness. All pertinent systems were reviewed above. Constitutional, HEENT, cardiovascular, respiratory, GI, GU, musculoskeletal, neuro, psychiatric, endocrine, integumentary and hematologic systems were reviewed and are otherwise negative/unremarkable except for positive findings mentioned above in the HPI.   MEDICATIONS AT HOME:   Prior to Admission medications     Medication Sig Start Date End Date Taking? Authorizing Provider  acetaminophen (TYLENOL) 325 MG tablet Take 2 tablets (650 mg total) by mouth every 6 (six) hours as needed for mild pain (or Fever >/= 101). Patient taking differently: Take 650 mg by mouth every 6 (six) hours as needed for mild pain or headache (fever >/= 101, or minor discomfort).  11/03/18  Yes Wieting, Richard, MD  albuterol (VENTOLIN HFA) 108 (90 Base) MCG/ACT inhaler Inhale 2 puffs into the lungs every 6 (six) hours as needed for wheezing or shortness of breath. Patient taking differently: Inhale 1 puff into the lungs every 6 (six) hours as needed for wheezing or shortness of breath.  11/21/19  Yes Ghimire, Werner LeanShanker M, MD  alum & mag hydroxide-simeth (ANTACID) 200-200-20 MG/5ML suspension Take 30 mLs by mouth 4 (four) times daily as needed for indigestion or heartburn.   Yes [provider]  collagenase (SANTYL) ointment Apply topically daily. Apply to right heel ulcer plus dry dressing change daily 11/22/19  Yes Ghimire, Werner LeanShanker M, MD  diazepam (VALIUM) 2 MG tablet Take 0.5-1 tablets (1-2 mg total) by mouth See admin instructions. Take 1 mg by mouth in the morning and 2 mg at bedtime 11/21/19  Yes Ghimire, Werner LeanShanker M, MD  diphenhydrAMINE (DIPHENHIST) 25 MG tablet Take 25 mg by mouth every 6 (six) hours as needed (for allergic reactions).   Yes [provider]  doxycycline (VIBRAMYCIN) 100 MG capsule Take 1 capsule (100 mg total) by mouth 2 (two) times daily for 10 days. 06/04/20 06/14/20 Yes Gilles ChiquitoSmith, Zachary P, MD  furosemide (LASIX) 20 MG tablet Take 2 tablets (40 mg total) by mouth daily. 11/21/19  Yes Ghimire, Werner LeanShanker M, MD  guaiFENesin (ROBITUSSIN) 100 MG/5ML liquid Take 300 mg by mouth every 6 (six) hours as needed for cough.   Yes [provider]  levothyroxine (SYNTHROID) 200 MCG tablet Take 1 tablet (200 mcg total) by mouth daily before breakfast. 11/21/19  Yes Ghimire, Werner LeanShanker M, MD  lidocaine (XYLOCAINE) 1 %  (with preservative) injection by Other route once. Used to mix with rocephin   Yes [provider]  liver oil-zinc oxide (DESITIN) 40 % ointment Apply topically daily. Patient taking differently: Apply 1 application topically See admin instructions. Apply to affected area once daily 11/04/18  Yes Wieting, Richard, MD  loperamide (IMODIUM A-D) 2 MG tablet Take 4 mg by mouth every 3 (three) hours as needed for diarrhea or loose stools (CANNOT EXCEED 8 DOSES/24 HOURS).   Yes [provider]  magnesium hydroxide (MILK OF MAGNESIA) 400 MG/5ML suspension Take 30 mLs by mouth daily as needed for mild constipation.   Yes [provider]  metoCLOPramide (REGLAN) 5 MG tablet Take 5 mg by mouth every 6 (six) hours as needed for nausea or vomiting.   Yes [provider]  Multiple Vitamins-Minerals (THERATRUM COMPLETE) TABS Take 1 tablet by mouth daily with breakfast.   Yes [provider]  nystatin cream (MYCOSTATIN) Apply  1 application topically 2 (two) times a week.   Yes [provider]  OLANZapine zydis (ZYPREXA) 10 MG disintegrating tablet Take 1 tablet (10 mg total) by mouth 2 (two) times daily. Patient taking differently: Take 15 mg by mouth at bedtime.  11/03/18  Yes Wieting, Richard, MD  OXYGEN Inhale 2 L/min into the lungs See admin instructions. "2 L/min, as tolerated"   Yes [provider]  sertraline (ZOLOFT) 25 MG tablet Take 1 tablet (25 mg total) by mouth daily. 06/10/19  Yes Charm Rings, NP  ascorbic acid (VITAMIN C) 500 MG tablet Take 500 mg by mouth 2 (two) times daily. Patient not taking: Reported on 06/08/2020    [provider]  Skin Protectants, Misc. (EUCERIN) cream Apply 1 application topically See admin instructions. Apply to both legs 2 times a day Patient not taking: Reported on 06/08/2020    [provider]      VITAL SIGNS:  Blood pressure (!) 90/49, pulse 84, temperature 98.7 F (37.1 C),  temperature source Oral, resp. rate 18, height 5\' 7"  (1.702 m), weight 108.9 kg, SpO2 97 %.  PHYSICAL EXAMINATION:  Physical Exam  GENERAL:  74 y.o.-year-old mildly restless Caucasian male patient lying in the bed with no acute distress.  EYES: Pupils equal, round, reactive to light and accommodation. No scleral icterus. Extraocular muscles intact.  HEENT: Head atraumatic, normocephalic. Oropharynx and nasopharynx clear.  NECK:  Supple, no jugular venous distention. No thyroid enlargement, no tenderness.  LUNGS: Normal breath sounds bilaterally, no wheezing, rales,rhonchi or crepitation. No use of accessory muscles of respiration.  CARDIOVASCULAR: Regular rate and rhythm, S1, S2 normal. No murmurs, rubs, or gallops.  ABDOMEN/skin: Soft, nondistended, nontender. Bowel sounds present. No organomegaly or mass.  EXTREMITIES/skin: Bilateral lower extremity pitting edema, more on the right than the left with associated erythema, induration and tenderness on the right leg with no cyanosis, or clubbing.  NEUROLOGIC: Cranial nerves II through XII are intact. Muscle strength 5/5 in all extremities. Sensation intact. Gait not checked.  PSYCHIATRIC: The patient is alert and oriented x 3.  Normal affect and good eye contact. SKIN: As above with no other obvious rash, lesion, or ulcer.    LABORATORY PANEL:   CBC Recent Labs  Lab 06/09/20 0201  WBC 10.8*  HGB 12.7*  HCT 39.9  PLT 213   ------------------------------------------------------------------------------------------------------------------  Chemistries  Recent Labs  Lab 06/08/20 2208 06/09/20 0201  NA 140  --   K 4.2  --   CL 104  --   CO2 26  --   GLUCOSE 135*  --   BUN 20  --   CREATININE 0.72  --   CALCIUM 7.9*  --   MG  --  2.1  AST 13*  --   ALT 8  --   ALKPHOS 64  --   BILITOT 0.5  --    ------------------------------------------------------------------------------------------------------------------  Cardiac  Enzymes No results for input(s): TROPONINI in the last 168 hours. ------------------------------------------------------------------------------------------------------------------  RADIOLOGY:  06/11/20 Venous Img Lower Unilateral Right  Result Date: 06/08/2020 CLINICAL DATA:  Initial evaluation for acute right leg pain, redness. History of cellulitis and venous stasis. EXAM: RIGHT LOWER EXTREMITY VENOUS DOPPLER ULTRASOUND TECHNIQUE: Gray-scale sonography with graded compression, as well as color Doppler and duplex ultrasound were performed to evaluate the lower extremity deep venous systems from the level of the common femoral vein and including the common femoral, femoral, profunda femoral, popliteal and calf veins including the posterior tibial, peroneal and gastrocnemius  veins when visible. The superficial great saphenous vein was also interrogated. Spectral Doppler was utilized to evaluate flow at rest and with distal augmentation maneuvers in the common femoral, femoral and popliteal veins. COMPARISON:  None. FINDINGS: Contralateral Common Femoral Vein: Respiratory phasicity is normal and symmetric with the symptomatic side. No evidence of thrombus. Normal compressibility. Common Femoral Vein: No evidence of thrombus. Normal compressibility, respiratory phasicity and response to augmentation. Saphenofemoral Junction: No evidence of thrombus. Normal compressibility and flow on color Doppler imaging. Profunda Femoral Vein: No evidence of thrombus. Normal compressibility and flow on color Doppler imaging. Femoral Vein: No evidence of thrombus. Normal compressibility, respiratory phasicity and response to augmentation. Popliteal Vein: No evidence of thrombus. Normal compressibility, respiratory phasicity and response to augmentation. Calf Veins: Veins of the calf are somewhat difficult to visualize on this exam. No appreciable thrombus. Normal compressibility and flow on color Doppler imaging. Superficial Great  Saphenous Vein: No evidence of thrombus. Normal compressibility. Venous Reflux:  None. Other Findings:  None. IMPRESSION: No evidence of deep venous thrombosis. Electronically Signed   By: Rise Mu M.D.   On: 06/08/2020 23:23   DG Chest Portable 1 View  Result Date: 06/08/2020 CLINICAL DATA:  Initial evaluation for peripheral edema. EXAM: PORTABLE CHEST 1 VIEW COMPARISON:  Prior radiograph from 05/01/2020 FINDINGS: Patient is rotated to the left. Cardiomegaly, stable from previous. Mediastinal silhouette within normal limits. Mild tortuosity the intrathoracic aorta noted. Lungs normally inflated. Asymmetric hazy opacity with pulmonary vascular prominence noted within the left lung, favored to reflect asymmetric pulmonary interstitial edema. This finding may part be accentuated by patient rotation. No visible pleural effusion. No consolidative airspace disease. No pneumothorax. No acute osseous finding. Degenerative changes noted about the visualized shoulders. IMPRESSION: Cardiomegaly with asymmetric hazy opacity within the left lung, favored to reflect asymmetric pulmonary interstitial edema. This finding may in part be accentuated by patient rotation. Electronically Signed   By: Rise Mu M.D.   On: 06/08/2020 23:27      IMPRESSION AND PLAN:  1. Right lower extremity moderate nonpurulent cellulitis. -The patient will be admitted to a medical monitored bed. -We will continue antibiotic therapy with IV Rocephin 2 g daily. -Warm compresses will be applied. -Pain management to be provided.  2. Acute pulmonary edema with associated bilateral lower extremity edema. 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 -We will cycle his troponins  3. Hypothyroidism. -We will continue Synthroid. His TSH was within normal.  4. Dementia with behavioral changes and depression. -We will continue Zoloft  and Zyprexa.  5. DVT prophylaxis. The subtenons Lovenox.  All the records are reviewed and case discussed with ED provider. The plan of care was discussed in details with the patient (and family). I answered all questions. The patient agreed to proceed with the above mentioned plan. Further management will depend upon hospital course.   CODE STATUS: Full code  Status is: Inpatient  Remains inpatient appropriate because:Ongoing active pain requiring inpatient pain management, Ongoing diagnostic testing needed not appropriate for outpatient work up, Unsafe d/c plan, IV treatments appropriate due to intensity of illness or inability to take PO and Inpatient level of care appropriate due to severity of illness   Dispo: The patient is from: SNF              Anticipated d/c is to: SNF  Anticipated d/c date is: 2 days              Patient currently is not medically stable to d/c.   TOTAL TIME TAKING CARE OF THIS PATIENT: 55 minutes.    Hannah Beat M.D on 06/09/2020 at 4:10 AM  [facility]   From 7 PM-7 AM, contact night-coverage [email or web address]  CC: Primary care physician; [facility]

## 2020-06-09 NOTE — Progress Notes (Signed)
Patient has a Marine scientist (DSS). CSW attempted to call Guardian listed in chart Macy Mis), she is no longer with [facility]. CSW left a voicemail for [employer] requesting a return call regarding patient for Readmission Screening and Heart Failure Screening.   Alfonso Ramus, [place] [phone]

## 2020-06-09 NOTE — Progress Notes (Addendum)
Anticoagulation monitoring(Lovenox):  74 yo male ordered Lovenox 40 mg Q24h  Filed Weights   06/08/20 2159  Weight: 108.9 kg (240 lb)   BMI 38   Lab Results  Component Value Date   CREATININE 0.72 06/08/2020   CREATININE 1.00 06/04/2020   CREATININE 0.93 05/01/2020   Estimated Creatinine Clearance: 95.3 mL/min (by C-G formula based on SCr of 0.72 mg/dL). Hemoglobin & Hematocrit     Component Value Date/Time   HGB 11.8 (L) 06/08/2020 2208   HCT 37.5 (L) 06/08/2020 2208     Per Protocol for Patient with estCrcl > 30 ml/min and BMI > 30, will transition to Lovenox 55 mg Q24h.

## 2020-06-09 NOTE — Progress Notes (Signed)
PROGRESS NOTE    Eric KosDaniel Winski III  ZOX:[id number] DOB: [birth year] DOA: 06/08/2020 PCP: Almetta LovelyHousecalls, Doctors Making   Brief Narrative: Taken from H&P. Eric [name]  is a 74 y.o. Caucasian male, coming from the [facility] skilled nursing facility with a known history of multiple medical problems that are mentioned below including hypothyroidism, hypertension, CHF, dementia, atrial fibrillation, and anxiety and depression, presented to the emergency room with acute onset of right lower extremity worsening redness and swelling.  Lower extremity Doppler was negative for DVT. See pictures in H&P He was started on ceftriaxone.  Also received IV Lasix as chest x-ray with some concern of asymmetric pulmonary edema, film was rotated.  Subjective: Patient was somnolent when seen today.  Easily arousable.  Denies any complaint, stating that he wants to sleep. Some nursing concern that he was combative and not cooperating with echocardiogram and carotid ultrasound-no need to do those test at this time.  Assessment & Plan:   Active Problems:   Cellulitis of right lower extremity  Right lower extremity nonpurulent cellulitis.  Patient remained afebrile, mild leukocytosis, blood cultures remain negative. Right lower extremity with significant edema, erythema and some oozing. -Continue with ceftriaxone. -Lower extremity compression stockings/Ace wrap. -Keep lower extremity elevated.  Chronic HFpEF.  EF according to echo done in January 2020 was 50, within lower normal limit.  Patient has diastolic dysfunction.  There was some concern of asymmetric pulmonary edema and he was started on IV diuresis. Troponin remain negative.  BNP of 18.4.  Patient appears euvolemic except right lower extremity edema which is secondary to cellulitis. -Discontinue echocardiogram-no need at this time. -Discontinue IV Lasix. -Continue with p.o. Lasix.  Hypothyroidism.  Recent TSH within normal limit. -Continue home dose of  Synthroid.  Dementia with behavioral changes and depression.  No acute concern. High risk for sundowning and delirium. -Continue home dose of Zoloft and Zyprexa. -Delirium precautions.  Objective: Vitals:   06/09/20 0400 06/09/20 0734 06/09/20 0750 06/09/20 1123  BP: (!) 90/49 (!) 100/54 (!) 96/55 98/60  Pulse: 84 (!) 104 81 76  Resp: 18 18 18 18   Temp:  99.5 F (37.5 C) 99.5 F (37.5 C) 98.8 F (37.1 C)  TempSrc:   Oral Oral  SpO2:  (!) 88% 91% 99%  Weight:      Height:        Intake/Output Summary (Last 24 hours) at 06/09/2020 1319 Last data filed at 06/09/2020 0631 Gross per 24 hour  Intake 236.96 ml  Output 2750 ml  Net -2513.04 ml   Filed Weights   06/08/20 2159  Weight: 108.9 kg    Examination:  General exam: Well-developed elderly man, appears calm and comfortable  Respiratory system: Clear to auscultation. Respiratory effort normal. Cardiovascular system: S1 & S2 heard, RRR.  Gastrointestinal system: Soft, nontender, nondistended, bowel sounds positive. Central nervous system: Alert and oriented. No focal neurological deficits. Extremities: Right lower extremity edema, and erythema with some oozing, no cyanosis, pulses intact and symmetrical. Psychiatry: Judgement and insight appear impaired.   DVT prophylaxis: Lovenox Code Status: Full, need to confirm with his guardian.  TOC is trying to reach them. Family Communication: Patient has a guardian, TOC is trying to reach them. Disposition Plan:  Status is: Inpatient  Remains inpatient appropriate because:Inpatient level of care appropriate due to severity of illness   Dispo: The patient is from: SNF              Anticipated d/c is to: SNF  Anticipated d/c date is: 1 day              Patient currently is not medically stable to d/c.    Consultants:   None  Procedures:  Antimicrobials:  Ceftriaxone  Data Reviewed: I have personally reviewed following labs and imaging  studies  CBC: Recent Labs  Lab 06/04/20 1840 06/08/20 2208 06/09/20 0201  WBC 10.4 9.7 10.8*  NEUTROABS 8.7* 8.0*  --   HGB 12.0* 11.8* 12.7*  HCT 37.2* 37.5* 39.9  MCV 91.9 92.8 91.5  PLT 182 207 213   Basic Metabolic Panel: Recent Labs  Lab 06/04/20 1840 06/08/20 2208 06/09/20 0201  NA 137 140  --   K 4.1 4.2  --   CL 101 104  --   CO2 26 26  --   GLUCOSE 119* 135*  --   BUN 20 20  --   CREATININE 1.00 0.72  --   CALCIUM 8.2* 7.9*  --   MG  --   --  2.1   GFR: Estimated Creatinine Clearance: 95.3 mL/min (by C-G formula based on SCr of 0.72 mg/dL). Liver Function Tests: Recent Labs  Lab 06/04/20 1840 06/08/20 2208  AST 13* 13*  ALT 9 8  ALKPHOS 65 64  BILITOT 1.0 0.5  PROT 6.5 6.4*  ALBUMIN 3.2* 3.1*   Recent Labs  Lab 06/04/20 1840  LIPASE 23   No results for input(s): AMMONIA in the last 168 hours. Coagulation Profile: Recent Labs  Lab 06/08/20 2208  INR 1.1   Cardiac Enzymes: No results for input(s): CKTOTAL, CKMB, CKMBINDEX, TROPONINI in the last 168 hours. BNP (last 3 results) No results for input(s): PROBNP in the last 8760 hours. HbA1C: Recent Labs    06/09/20 0201  HGBA1C 5.2   CBG: No results for input(s): GLUCAP in the last 168 hours. Lipid Profile: Recent Labs    06/09/20 0201  CHOL 128  HDL 49  LDLCALC 71  TRIG 38  CHOLHDL 2.6   Thyroid Function Tests: Recent Labs    06/09/20 0201  TSH 3.690   Anemia Panel: No results for input(s): VITAMINB12, FOLATE, FERRITIN, TIBC, IRON, RETICCTPCT in the last 72 hours. Sepsis Labs: Recent Labs  Lab 06/04/20 1840 06/08/20 2208  LATICACIDVEN 1.1 1.4    Recent Results (from the past 240 hour(s))  Respiratory Panel by RT PCR (Flu A&B, Covid) - Nasopharyngeal Swab     Status: None   Collection Time: 06/04/20  6:44 PM   Specimen: Nasopharyngeal Swab  Result Value Ref Range Status   SARS Coronavirus 2 by RT PCR NEGATIVE NEGATIVE Final    Comment: (NOTE) SARS-CoV-2 target  nucleic acids are NOT DETECTED.  The SARS-CoV-2 RNA is generally detectable in upper respiratoy specimens during the acute phase of infection. The lowest concentration of SARS-CoV-2 viral copies this assay can detect is 131 copies/mL. A negative result does not preclude SARS-Cov-2 infection and should not be used as the sole basis for treatment or other patient management decisions. A negative result may occur with  improper specimen collection/handling, submission of specimen other than nasopharyngeal swab, presence of viral mutation(s) within the areas targeted by this assay, and inadequate number of viral copies (<131 copies/mL). A negative result must be combined with clinical observations, patient history, and epidemiological information. The expected result is Negative.  Fact Sheet for Patients:  https://www.moore.com/  Fact Sheet for Healthcare Providers:  https://www.young.biz/  This test is no t yet approved or cleared by the [place]  FDA and  has been authorized for detection and/or diagnosis of SARS-CoV-2 by FDA under an Emergency Use Authorization (EUA). This EUA will remain  in effect (meaning this test can be used) for the duration of the COVID-19 declaration under Section 564(b)(1) of the Act, 21 U.S.C. section 360bbb-3(b)(1), unless the authorization is terminated or revoked sooner.     Influenza A by PCR NEGATIVE NEGATIVE Final   Influenza B by PCR NEGATIVE NEGATIVE Final    Comment: (NOTE) The Xpert Xpress SARS-CoV-2/FLU/RSV assay is intended as an aid in  the diagnosis of influenza from Nasopharyngeal swab specimens and  should not be used as a sole basis for treatment. Nasal washings and  aspirates are unacceptable for Xpert Xpress SARS-CoV-2/FLU/RSV  testing.  Fact Sheet for Patients: https://www.moore.com/  Fact Sheet for Healthcare  Providers: https://www.young.biz/  This test is not yet approved or cleared by the [place] FDA and  has been authorized for detection and/or diagnosis of SARS-CoV-2 by  FDA under an Emergency Use Authorization (EUA). This EUA will remain  in effect (meaning this test can be used) for the duration of the  Covid-19 declaration under Section 564(b)(1) of the Act, 21  U.S.C. section 360bbb-3(b)(1), unless the authorization is  terminated or revoked. Performed at [facility], [address] [place], [place] [address]   Culture, blood (Routine x 2)     Status: None (Preliminary result)   Collection Time: 06/08/20 10:08 PM   Specimen: BLOOD  Result Value Ref Range Status   Specimen Description BLOOD RIGHT [facility]  Final   Special Requests   Final    BOTTLES DRAWN AEROBIC AND ANAEROBIC Blood Culture results may not be optimal due to an excessive volume of blood received in culture bottles   Culture   Final    NO GROWTH < 12 HOURS Performed at [facility], [address], [place], [place] [address]    Report Status PENDING  Incomplete  Culture, blood (Routine x 2)     Status: None (Preliminary result)   Collection Time: 06/08/20 10:08 PM   Specimen: BLOOD  Result Value Ref Range Status   Specimen Description BLOOD LEFT HAND  Final   Special Requests   Final    BOTTLES DRAWN AEROBIC AND ANAEROBIC Blood Culture adequate volume   Culture   Final    NO GROWTH < 12 HOURS Performed at [facility], [address], [place], [place] [address]    Report Status PENDING  Incomplete  Resp Panel by RT-PCR (Flu A&B, Covid) Nasopharyngeal Swab     Status: None   Collection Time: 06/08/20 10:10 PM   Specimen: Nasopharyngeal Swab; Nasopharyngeal(NP) swabs in vial transport medium  Result Value Ref Range Status   SARS Coronavirus 2 by RT PCR NEGATIVE NEGATIVE Final    Comment: (NOTE) SARS-CoV-2 target nucleic acids are NOT DETECTED.  The  SARS-CoV-2 RNA is generally detectable in upper respiratory specimens during the acute phase of infection. The lowest concentration of SARS-CoV-2 viral copies this assay can detect is 138 copies/mL. A negative result does not preclude SARS-Cov-2 infection and should not be used as the sole basis for treatment or other patient management decisions. A negative result may occur with  improper specimen collection/handling, submission of specimen other than nasopharyngeal swab, presence of viral mutation(s) within the areas targeted by this assay, and inadequate number of viral copies(<138 copies/mL). A negative result must be combined with clinical observations, patient history, and epidemiological information. The expected result  is Negative.  Fact Sheet for Patients:  BloggerCourse.com  Fact Sheet for Healthcare Providers:  SeriousBroker.it  This test is no t yet approved or cleared by the [place] FDA and  has been authorized for detection and/or diagnosis of SARS-CoV-2 by FDA under an Emergency Use Authorization (EUA). This EUA will remain  in effect (meaning this test can be used) for the duration of the COVID-19 declaration under Section 564(b)(1) of the Act, 21 U.S.C.section 360bbb-3(b)(1), unless the authorization is terminated  or revoked sooner.       Influenza A by PCR NEGATIVE NEGATIVE Final   Influenza B by PCR NEGATIVE NEGATIVE Final    Comment: (NOTE) The Xpert Xpress SARS-CoV-2/FLU/RSV plus assay is intended as an aid in the diagnosis of influenza from Nasopharyngeal swab specimens and should not be used as a sole basis for treatment. Nasal washings and aspirates are unacceptable for Xpert Xpress SARS-CoV-2/FLU/RSV testing.  Fact Sheet for Patients: BloggerCourse.com  Fact Sheet for Healthcare Providers: SeriousBroker.it  This test is not yet approved or  cleared by the [place] FDA and has been authorized for detection and/or diagnosis of SARS-CoV-2 by FDA under an Emergency Use Authorization (EUA). This EUA will remain in effect (meaning this test can be used) for the duration of the COVID-19 declaration under Section 564(b)(1) of the Act, 21 U.S.C. section 360bbb-3(b)(1), unless the authorization is terminated or revoked.  Performed at [facility], [address] [place], [place] [address]   MRSA PCR Screening     Status: Abnormal   Collection Time: 06/09/20 12:52 AM   Specimen: Nasopharyngeal  Result Value Ref Range Status   MRSA by PCR POSITIVE (A) NEGATIVE Final    Comment:        The GeneXpert MRSA Assay (FDA approved for NASAL specimens only), is one component of a comprehensive MRSA colonization surveillance program. It is not intended to diagnose MRSA infection nor to guide or monitor treatment for MRSA infections. RESULT CALLED TO, READ BACK BY AND VERIFIED WITH: DEBRA EMDEN RN AT 0207 ON 06/09/20 [facility] Performed at [facility], [address] [place], [place] [address]      Radiology Studies: US Venous Img Lower Unilateral Right  Result Date: 06/08/2020 CLINICAL DATA:  Initial evaluation for acute right leg pain, redness. History of cellulitis and venous stasis. EXAM: RIGHT LOWER EXTREMITY VENOUS DOPPLER ULTRASOUND TECHNIQUE: Gray-scale sonography with graded compression, as well as color Doppler and duplex ultrasound were performed to evaluate the lower extremity deep venous systems from the level of the common femoral vein and including the common femoral, femoral, profunda femoral, popliteal and calf veins including the posterior tibial, peroneal and gastrocnemius veins when visible. The superficial great saphenous vein was also interrogated. Spectral Doppler was utilized to evaluate flow at rest and with distal augmentation maneuvers in the common femoral, femoral and popliteal veins.  COMPARISON:  None. FINDINGS: Contralateral Common Femoral Vein: Respiratory phasicity is normal and symmetric with the symptomatic side. No evidence of thrombus. Normal compressibility. Common Femoral Vein: No evidence of thrombus. Normal compressibility, respiratory phasicity and response to augmentation. Saphenofemoral Junction: No evidence of thrombus. Normal compressibility and flow on color Doppler imaging. Profunda Femoral Vein: No evidence of thrombus. Normal compressibility and flow on color Doppler imaging. Femoral Vein: No evidence of thrombus. Normal compressibility, respiratory phasicity and response to augmentation. Popliteal Vein: No evidence of thrombus. Normal compressibility, respiratory phasicity and response to augmentation. Calf Veins: Veins of the calf are somewhat difficult to visualize  on this exam. No appreciable thrombus. Normal compressibility and flow on color Doppler imaging. Superficial Great Saphenous Vein: No evidence of thrombus. Normal compressibility. Venous Reflux:  None. Other Findings:  None. IMPRESSION: No evidence of deep venous thrombosis. Electronically Signed   By: Rise Mu M.D.   On: 06/08/2020 23:23   DG Chest Portable 1 View  Result Date: 06/08/2020 CLINICAL DATA:  Initial evaluation for peripheral edema. EXAM: PORTABLE CHEST 1 VIEW COMPARISON:  Prior radiograph from 05/01/2020 FINDINGS: Patient is rotated to the left. Cardiomegaly, stable from previous. Mediastinal silhouette within normal limits. Mild tortuosity the intrathoracic aorta noted. Lungs normally inflated. Asymmetric hazy opacity with pulmonary vascular prominence noted within the left lung, favored to reflect asymmetric pulmonary interstitial edema. This finding may part be accentuated by patient rotation. No visible pleural effusion. No consolidative airspace disease. No pneumothorax. No acute osseous finding. Degenerative changes noted about the visualized shoulders. IMPRESSION:  Cardiomegaly with asymmetric hazy opacity within the left lung, favored to reflect asymmetric pulmonary interstitial edema. This finding may in part be accentuated by patient rotation. Electronically Signed   By: Rise Mu M.D.   On: 06/08/2020 23:27    Scheduled Meds: . Chlorhexidine Gluconate Cloth  6 each Topical Q0600  . diazepam  1 mg Oral q morning - 10a  . diazepam  2 mg Oral QHS  . enoxaparin (LOVENOX) injection  55 mg Subcutaneous Q24H  . furosemide  40 mg Intravenous BID  . levothyroxine  200 mcg Oral Q0600  . liver oil-zinc oxide  1 application Topical Daily  . multivitamin with minerals  1 tablet Oral Q breakfast  . mupirocin ointment  1 application Nasal BID  . nystatin cream  1 application Topical Once per day on Mon Thu  . OLANZapine zydis  15 mg Oral QHS  . sertraline  25 mg Oral Daily   Continuous Infusions: . cefTRIAXone (ROCEPHIN)  IV Stopped (06/08/20 2316)     LOS: 1 day   Time spent: 35 minutes.  Arnetha Courser, MD [facility]  If 7PM-7AM, please contact night-coverage [email or web address]  06/09/2020, 1:19 PM   This record has been created using Conservation officer, historic buildings. Errors have been sought and corrected,but may not always be located. Such creation errors do not reflect on the standard of care.

## 2020-06-09 NOTE — Progress Notes (Signed)
Anticoagulation monitoring(Lovenox):  74 yo male ordered Lovenox 40 mg Q24h  Filed Weights   06/08/20 2159  Weight: 108.9 kg (240 lb)   BMI 37.59    Lab Results  Component Value Date   CREATININE 0.72 06/08/2020   CREATININE 1.00 06/04/2020   CREATININE 0.93 05/01/2020   Estimated Creatinine Clearance: 95.3 mL/min (by C-G formula based on SCr of 0.72 mg/dL). Hemoglobin & Hematocrit     Component Value Date/Time   HGB 11.8 (L) 06/08/2020 2208   HCT 37.5 (L) 06/08/2020 2208     Per Protocol for Patient with estCrcl > 30 ml/min and BMI > 30, will transition to Lovenox 55 mg Q24h.

## 2020-06-09 NOTE — Progress Notes (Signed)
Pt was very combative today. This nurse was unable to administer PO medications. Pt pulled out IV and refused to keep on telemetry monitor. Ativan administered, and was effective. MD notified.  Arlana Hove, RN

## 2020-06-09 NOTE — Progress Notes (Signed)
Lab called. Patient positive for MRSA (PCR)

## 2020-06-09 NOTE — Progress Notes (Signed)
PT Cancellation Note  Patient Details Name: Tyris Eliot III MRN: [id number] DOB: [birth year]   Cancelled Treatment:    Reason Eval/Treat Not Completed: Patient declined, no reason specified. Multiple attempts made to complete PT evaluation. Patient sleeping on arrival to room and becomes agitated when he is awake. Not agreeable to PT at this time. Will continue with attempts.   Donna Bernard, PT, MPT  Ina Homes 06/09/2020, 2:13 PM

## 2020-06-10 DIAGNOSIS — L03115 Cellulitis of right lower limb: Secondary | ICD-10-CM | POA: Diagnosis not present

## 2020-06-10 MED ORDER — CEFDINIR 300 MG PO CAPS: 300.0000 mg | ORAL_CAPSULE | Freq: Two times a day (BID) | ORAL | 0 refills | Status: AC

## 2020-06-10 MED ORDER — CEFDINIR 300 MG PO CAPS
300.0000 mg | ORAL_CAPSULE | Freq: Two times a day (BID) | ORAL | Status: DC
  Filled 2020-06-10: qty 1

## 2020-06-10 MED ORDER — MUPIROCIN 2 % EX OINT: 1.0000 "application " | TOPICAL_OINTMENT | Freq: Two times a day (BID) | CUTANEOUS | 0 refills | Status: DC

## 2020-06-10 NOTE — Progress Notes (Signed)
Pt discharged to the [facility].  No IV in place, Removed by pt.  AVS placed in packet.  Pt transported to facility by ambulance.  Report called to Billey Gosling, RN

## 2020-06-10 NOTE — TOC Transition Note (Signed)
Transition of Care [facility]) - CM/SW Discharge Note   Patient Details  Name: Eric [name] MRN: [id number] Date of Birth: [birth year]  [facility] CM/SW Contact:  Liliana Cline, LCSW Phone Number: 06/10/2020, 12:25 PM   Clinical Narrative:   Patient to discharge to [facility] today. Legal Guardian and [employer] aware. RN called report. EMS transport arranged for 2:00 pick up time. Medical Necessity and Face Sheet placed in Discharge Packet.    Final next level of care: Assisted Living     Patient Goals and CMS Choice Patient states their goals for this hospitalization and ongoing recovery are:: ALF per Guardian CMS Medicare.gov Compare Post Acute Care list provided to:: Legal Guardian Choice offered to / list presented to : [facility] POA / Guardian  Discharge Placement              Patient chooses bed at:  ([facility]) Patient to be transferred to facility by: EMS Name of family member notified: Legal Guardian Patient and family notified of of transfer: 06/10/20  Discharge Plan and Services                                     Social Determinants of Health (SDOH) Interventions     Readmission Risk Interventions Readmission Risk Prevention Plan 06/10/2020 11/03/2018 10/31/2018  Transportation Screening Complete Complete Complete  PCP or Specialist Appt within 5-7 Days - Complete -  PCP or Specialist Appt within 3-5 Days Complete - -  Home Care Screening - Complete Complete  Medication Review (RN CM) - Complete -  HRI or Home Care Consult Complete - -  Social Work Consult for Recovery Care Planning/Counseling Complete - -  Palliative Care Screening Not Applicable - -  Medication Review (RN Care Manager) Complete - -  Some recent data might be hidden

## 2020-06-10 NOTE — Evaluation (Signed)
Physical Therapy Evaluation Patient Details Name: Eric [name] MRN: [id number] DOB: April 24, 1946 Today's Date: 06/10/2020   History of Present Illness  Eric [name]  is a 74 y.o. Caucasian male, coming from [facility] with a known history  hypothyroidism, hypertension, CHF, dementia, atrial fibrillation, and anxiety and depression, presented to the emergency room with acute onset of right lower extremity worsening redness and swelling. Venous doppler negative for DVT    Clinical Impression  Patient pleasant initially, however is easily agitated with mobility efforts. Patient laying towards right side in bed, refusing repositioning or bed mobility. Patient does perform straight leg raise x 2 bouts on each LE for elevation and repositioning. Patient is likely deconditioned and could benefit from PT at next level of care if agreeable to participate. Patient is anticipated to return to previous facility today. Given discharge plan for today and given cognition/agitation, no further acute PT needs at this time.     Follow Up Recommendations  (return to previous living environment with PT if available )    Equipment Recommendations  None recommended by PT    Recommendations for Other Services       Precautions / Restrictions Precautions Precautions: Fall Precaution Comments: confusion and agitation  Restrictions Weight Bearing Restrictions: No      Mobility  Bed Mobility               General bed mobility comments: Not formally assessed due to patient refusal for participation. Patient becoming increasingly agitated with mobility attempts. Patient prefers to lay toward right side close to bed rail and refused repositioning.     Transfers                 General transfer comment: Unable to test due to patient cooperation and participation   Ambulation/Gait                Stairs            Wheelchair Mobility    Modified Rankin (Stroke Patients  Only)       Balance                                             Pertinent Vitals/Pain Pain Assessment: Faces Faces Pain Scale: Hurts even more Pain Location: with active movement of RLE  Pain Descriptors / Indicators: Tender Pain Intervention(s): Limited activity within patient's tolerance;Monitored during session;Repositioned (elevated on pillow to float heels and placed on chuck to wick weeping skin)    Home Living Family/patient expects to be discharged to::  ([facility] )               Home Equipment:  (unsure of equipment use at facility) Additional Comments: [facility]    Prior Function Level of Independence: Needs assistance         Comments: Patient comes from [facility] [place] [place]. Patient unable to provide information about PLOF at this time due to cognition     Hand Dominance        Extremity/Trunk Assessment   Upper Extremity Assessment Upper Extremity Assessment: Defer to OT evaluation    Lower Extremity Assessment Lower Extremity Assessment: Generalized weakness;Difficult to assess due to impaired cognition (can SLR briefly with BLE, unable to follow commands for MMT )       Communication   Communication:  HOH  Cognition Arousal/Alertness: Awake/alert Behavior During Therapy: Flat affect;Agitated Overall Cognitive Status: No family/caregiver present to determine baseline cognitive functioning                                 General Comments: patient is agitated easily and required frequent redirection for attention to task. patient can intermittently follow single step commands       General Comments General comments (skin integrity, edema, etc.): unable to assess balance, pt refuses to mobilize with PT/OT    Exercises Other Exercises Other Exercises: educated patient to participate with therapy and mobility efforts. Patient performed 2 SLR with each LE for repostioning and LE  elevation    Assessment/Plan    PT Assessment All further PT needs can be met in the next venue of care  PT Problem List Decreased strength;Decreased mobility;Decreased safety awareness       PT Treatment Interventions      PT Goals (Current goals can be found in the Care Plan section)  Acute Rehab PT Goals Patient Stated Goal: patient unable to participate in goal setting  PT Goal Formulation: Patient unable to participate in goal setting Time For Goal Achievement: 06/10/20    Frequency     Barriers to discharge        Co-evaluation PT/OT/SLP Co-Evaluation/Treatment: Yes Reason for Co-Treatment: Necessary to address cognition/behavior during functional activity PT goals addressed during session: Strengthening/ROM OT goals addressed during session: ADL's and self-care       AM-PAC PT "6 Clicks" Mobility  Outcome Measure Help needed turning from your back to your side while in a flat bed without using bedrails?: A Lot Help needed moving from lying on your back to sitting on the side of a flat bed without using bedrails?: A Lot Help needed moving to and from a bed to a chair (including a wheelchair)?: A Lot Help needed standing up from a chair using your arms (e.g., wheelchair or bedside chair)?: A Lot Help needed to walk in hospital room?: A Lot Help needed climbing 3-5 steps with a railing? : A Lot 6 Click Score: 12    End of Session   Activity Tolerance: Treatment limited secondary to agitation Patient left: in bed;with bed alarm set;with call bell/phone within reach Nurse Communication: Mobility status PT Visit Diagnosis: Muscle weakness (generalized) (M62.81)    Time: 9417-4081 PT Time Calculation (min) (ACUTE ONLY): 12 min   Charges:   PT Evaluation $PT Eval Moderate Complexity: 1 Mod          Minna Merritts, PT, MPT  Percell Locus 06/10/2020, 12:58 PM

## 2020-06-10 NOTE — Evaluation (Signed)
Occupational Therapy Evaluation Patient Details Name: Eric [name] MRN: [id number] DOB: 07-28-1945 Today's Date: 06/10/2020    History of Present Illness Pt is a 74 y/o M with PMH: paroxysmal Afib, HTN, hypothyroidism, dementia, and chronic combined systolic and diastolic CHF. Pt adm in June of 2021 d/t aggressive behavior and foot pain. Pt presented to the emergency room with acute onset of right lower extremity worsening redness and swelling. W/u included doppler that was negative for DVT. Pt adm with nonpurulent cellulitis of R LE.   Clinical Impression   Pt is seen for OT evaluation this date in setting of acute hospitalization with R LE cellulitis. Pt is from [place], and presents with confusion making it difficult to glean any other PLOF information. In previous therapy evaluation at this facility, pt reported no use of RW. Pt presents this date calmly lying in bed initially, but quickly and intermittently demos bouts of agitation when asked to participate on any level in a therapy assessment. Pt somewhat agreeable to simple one step tasks such as washing face at bed level with SETUP, but pt refuses other attempts to engage in ADLs and refuses to mobilize with PT/OT this date. OT attempts to education pt re: importance/rationale to trying to exercise/mobilize as he is able to tolerate. Pt minimally receptive to this education and demos poor carryover. As pt is minimally participatory in evaluation and very limited assessment data is able to be collected, anticipate further attempts to engage pt in occupational therapy in the acute setting would be futile. If he has adequate support at [facility] facility, anticipate-from OT standpoint-that pt could return there. Please re-consult should pt become more agreeable to participation. Will complete order at this time.     Follow Up Recommendations  Other (comment) (Prior arrangements at [facility])    Equipment Recommendations  Other  (comment) (defer to next level of care, anticiapte hospital bed and wheelchair will be necessary if not already available at facility.)    Recommendations for Other Services       Precautions / Restrictions Precautions Precautions: Fall Precaution Comments: pt confused and combative at times. Restrictions Weight Bearing Restrictions: No      Mobility Bed Mobility               General bed mobility comments: unable to test, pt refuses    Transfers                 General transfer comment: unable to test, pt refuses    Balance                                           ADL either performed or assessed with clinical judgement   ADL                                         General ADL Comments: Pt engages in washing face with SETUP and MIN cues to sequence. Pt is otherwise unwilling to perform self care ADLs on assessment. Anticipate d/t limited tolerance for moving LEs that he would require MAX to TOTAL A with LB ADLs.     Vision   Additional Comments: diffiuclt to formally assess d/t cognition. Pt tracks appropriately throughout portions of assessment for which he is participatory  and provides moderate visual attn with therapists.     Perception     Praxis      Pertinent Vitals/Pain Pain Assessment: Faces Faces Pain Scale: Hurts even more Pain Location: R LE with attempts to improve positioning Pain Descriptors / Indicators: Tender Pain Intervention(s): Limited activity within patient's tolerance;Monitored during session;Repositioned (elevated on pillow to float heels and placed on chuck to wick weeping skin)     Hand Dominance     Extremity/Trunk Assessment Upper Extremity Assessment Upper Extremity Assessment: Generalized weakness;Difficult to assess due to impaired cognition (pt is minimally participatory in UE assessment, grip strength is grossly 3/5, shld/elbow ROM passively WFL, but difficult to engage pt in more  formal AROM/strength assessment 2/2 agitation.)   Lower Extremity Assessment Lower Extremity Assessment: Defer to PT evaluation;Generalized weakness       Communication Communication Communication: HOH   Cognition Arousal/Alertness: Awake/alert Behavior During Therapy: Agitated;Flat affect (alternating between flat and agitated with attempts to elicit any participation) Overall Cognitive Status: No family/caregiver present to determine baseline cognitive functioning                                 General Comments: Pt is not oriented to situation or place, he is easily excitable, requires gentle re-redirection, soft voices and calming strategies. Somewhat participatory with simple one-step commands to assess strength/ROM at bed level. Evaluation limited d/t pt agitation with attempts to elicit partiicpation in mobilization, assessment, or ask questions regarding his prior function.   General Comments  unable to assess balance, pt refuses to mobilize with PT/OT    Exercises Other Exercises Other Exercises: OT facilitates ed re: importance of mobilizing to assist in improving circulation and healing. In addition, OT facilitates ed re: repositioning considerations for skin/comfort/healing. Pt with minimal reception of education, in part d/t cognitive capacity, but also difficult to educate d/t willingness and intermittent bouts of agitation.   Shoulder Instructions      Home Living Family/patient expects to be discharged to:: Assisted living                             Home Equipment:  (unsure of equipment use at facility)   Additional Comments: [facility]      Prior Functioning/Environment Level of Independence: Needs assistance        Comments: Pt is questionable historian, difficulty answering PLOF/setup information. Pt is also agitated with attempts at asking questions regarding his prior status. He is from [place] [facility] so assisted with IADLs,  but unclear of his ADL status.        OT Problem List: Decreased strength;Decreased activity tolerance;Decreased cognition;Decreased safety awareness;Decreased knowledge of use of DME or AE;Pain;Increased edema      OT Treatment/Interventions:      OT Goals(Current goals can be found in the care plan section) Acute Rehab OT Goals Patient Stated Goal: none stated OT Goal Formulation: All assessment and education complete, DC therapy  OT Frequency:     Barriers to D/C:            Co-evaluation PT/OT/SLP Co-Evaluation/Treatment: Yes Reason for Co-Treatment: Complexity of the patient's impairments (multi-system involvement);Necessary to address cognition/behavior during functional activity PT goals addressed during session: Strengthening/ROM OT goals addressed during session: ADL's and self-care      AM-PAC OT "6 Clicks" Daily Activity     Outcome Measure Help from another person  eating meals?: None Help from another person taking care of personal grooming?: A Little Help from another person toileting, which includes using toliet, bedpan, or urinal?: A Lot Help from another person bathing (including washing, rinsing, drying)?: Total Help from another person to put on and taking off regular upper body clothing?: A Lot Help from another person to put on and taking off regular lower body clothing?: Total 6 Click Score: 13   End of Session Nurse Communication: Other (comment) (notified of limited assessment)  Activity Tolerance: Treatment limited secondary to agitation Patient left: in bed;with call bell/phone within reach;with bed alarm set  OT Visit Diagnosis: Unsteadiness on feet (R26.81);Muscle weakness (generalized) (M62.81)                Time: 4431-5400 OT Time Calculation (min): 13 min Charges:  OT General Charges $OT Visit: 1 Visit OT Evaluation $OT Eval Moderate Complexity: 1 [address] [place], OTR/L ascom [phone] 06/10/20, 12:02 PM

## 2020-06-10 NOTE — TOC Progression Note (Addendum)
Transition of Care [facility]) - Progression Note    Patient Details  Name: Eric [name] MRN: [id number] Date of Birth: 1945/10/15  Transition [facility]) CM/SW Contact  Liliana Cline, LCSW Phone Number: 06/10/2020, 11:13 AM  Clinical Narrative:  Informed by MD that patient is ready for discharge back to ALF today. CSW attempted call to [facility] and exercise psychologist (DSS Guardian Supervisors). Left voicemails requesting a return call. Called [facility] who confirmed patient is a resident there. Was transferred to see if they can accept patient back today, was unable to reach anyone, called back x 3.  11:20- Called DSS Director Lady Gary. She will have [place] call CSW back. Received a return call from [facility]. She confirmed it is fine for patient to go back to [facility] today. Called [facility], spoke to Marshall who confirmed they can take patient back today. She reported they will need EMS transport. Updated MD and RN. Sent FL2 to ALF. Asked RN to call report.          Expected Discharge Plan and Services                                                 Social Determinants of Health (SDOH) Interventions    Readmission Risk Interventions Readmission Risk Prevention Plan 11/03/2018 10/31/2018  Transportation Screening Complete Complete  PCP or Specialist Appt within 5-7 Days Complete -  Home Care Screening Complete Complete  Medication Review (RN CM) Complete -  Some recent data might be hidden

## 2020-06-10 NOTE — NC FL2 (Addendum)
[place] MEDICAID FL2 LEVEL OF CARE SCREENING TOOL     IDENTIFICATION  Patient Name: Eric [name] Birthdate: 04/21/1946 Sex: male Admission Date (Current Location): 06/08/2020  [place] and [place] Number:  Chiropodist and Address:  [facility], [address] [place], [place] [address]      Provider Number: [id number]  Attending Physician Name and Address:  Arnetha Courser, MD  Relative Name and Phone Number:  Bertis Ruddy (APS Guardian) [phone]    Current Level of Care: Hospital Recommended Level of Care: Assisted Living Facility Prior Approval Number:    Date Approved/Denied:   PASRR Number:    Discharge Plan:      Current Diagnoses: Patient Active Problem List   Diagnosis Date Noted  . Bilateral lower leg cellulitis 01/12/2020  . Venous stasis ulcer of both lower extremities without varicose veins (HCC) 01/12/2020  . Chronic respiratory failure with hypoxia (HCC) 01/12/2020  . Cellulitis of right lower extremity   . Severe protein-calorie malnutrition (HCC)   . Right foot ulcer (HCC) 11/16/2019  . Anxiety and depression   . Cellulitis of right foot   . 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 Chronic combined systolic and diastolic CHF (congestive heart failure) (HCC) 11/16/2018  . Anasarca 11/16/2018  . Hypothyroidism 11/16/2018  . PAF (paroxysmal atrial fibrillation) (HCC) 11/16/2018  . HTN (hypertension) 11/16/2018  . Generalized anxiety disorder 11/03/2018  . Major depression in partial remission (HCC) 11/03/2018  . Acute hypoxemic respiratory failure (HCC) 10/29/2018  . Suspected COVID-19 virus infection 10/29/2018    Orientation RESPIRATION BLADDER Height & Weight      (disoriented)  O2 Incontinent, External catheter Weight: 237 lb 10.5 oz (107.8 kg) Height:  5\' 7"  (170.2 cm)  BEHAVIORAL SYMPTOMS/MOOD NEUROLOGICAL BOWEL NUTRITION STATUS   (agitated)    Incontinent Diet (heart)  AMBULATORY STATUS COMMUNICATION OF NEEDS Skin   Wheelchair bound Verbally Other (Comment) (cellulitis- legs)                       Personal Care Assistance Level of Assistance  Bathing, Feeding, Dressing Bathing Assistance: Limited assistance Feeding assistance: Independent Dressing Assistance: Limited assistance     Functional Limitations Info             SPECIAL CARE FACTORS FREQUENCY                       Contractures      Additional Factors Info  Code Status, Allergies Code Status Info: full code Allergies Info: demerol, epinephrine, nardil, morphine and related, ondansetron, shellfish           Current Medications (06/10/2020):  This is the current hospital active medication list Current Facility-Administered Medications  Medication Dose Route Frequency Provider Last Rate Last Admin  . acetaminophen (TYLENOL) tablet 650 mg  650 mg Oral Q4H PRN 06/12/2020, MD       Or  . acetaminophen (TYLENOL) suppository 650 mg  650 mg Rectal Q4H PRN Arnetha Courser, MD      . albuterol (PROVENTIL) (2.5 MG/3ML) 0.083% nebulizer solution 2.5 mg  2.5 mg Inhalation Q6H PRN Mansy, Jan A, MD      . alum & mag hydroxide-simeth (MAALOX/MYLANTA) 200-200-20 MG/5ML suspension 30 mL  30 mL Oral QID PRN Mansy, Jan A, MD      . cefTRIAXone (ROCEPHIN) 2 g in sodium chloride 0.9 % 100 mL IVPB  2 g Intravenous  Q24H Minna Antis, MD 200 mL/hr at 06/09/20 2300 2 g at 06/09/20 2300  . Chlorhexidine Gluconate Cloth 2 % PADS 6 each  6 each Topical Q0600 Mansy, Jan A, MD      . diazepam (VALIUM) tablet 1 mg  1 mg Oral q morning - 10a Mansy, Jan A, MD      . diazepam (VALIUM) tablet 2 mg  2 mg Oral QHS Mansy, Jan A, MD      . diphenhydrAMINE (BENADRYL) capsule 25 mg  25 mg Oral Q6H PRN Mansy, Jan A, MD      . enoxaparin (LOVENOX) injection 55 mg  55 mg Subcutaneous Q24H Mansy, Jan A, MD      . furosemide (LASIX) injection 40 mg  40 mg Intravenous BID Mansy,  Jan A, MD   40 mg at 06/09/20 1707  . guaiFENesin (ROBITUSSIN) 100 MG/5ML solution 300 mg  300 mg Oral Q6H PRN Mansy, Jan A, MD      . ketorolac (TORADOL) 15 MG/ML injection 15 mg  15 mg Intravenous Q6H PRN Mansy, Jan A, MD   15 mg at 06/09/20 0149  . levothyroxine (SYNTHROID) tablet 200 mcg  200 mcg Oral Q0600 Mansy, Jan A, MD      . liver oil-zinc oxide (DESITIN) 40 % ointment 1 application  1 application Topical Daily Mansy, Jan A, MD      . loperamide (IMODIUM) capsule 4 mg  4 mg Oral Q3H PRN Mansy, Jan A, MD      . LORazepam (ATIVAN) injection 0.5 mg  0.5 mg Intravenous Q4H PRN Arnetha Courser, MD   0.5 mg at 06/10/20 0138  . magnesium hydroxide (MILK OF MAGNESIA) suspension 30 mL  30 mL Oral Daily PRN Mansy, Jan A, MD      . metoCLOPramide (REGLAN) tablet 5 mg  5 mg Oral Q6H PRN Mansy, Jan A, MD      . multivitamin with minerals tablet 1 tablet  1 tablet Oral Q breakfast Mansy, Jan A, MD      . mupirocin ointment (BACTROBAN) 2 % 1 application  1 application Nasal BID Mansy, Jan A, MD      . nystatin cream (MYCOSTATIN) 1 application  1 application Topical Once per day on Mon Thu Mansy, Jan A, MD      . OLANZapine zydis [facility]) disintegrating tablet 15 mg  15 mg Oral QHS Mansy, Jan A, MD      . sertraline (ZOLOFT) tablet 25 mg  25 mg Oral Daily Mansy, Jan A, MD      . traZODone (DESYREL) tablet 25 mg  25 mg Oral QHS PRN Mansy, Vernetta Honey, MD         Discharge Medications: Please see discharge summary for a list of discharge medications.  Relevant Imaging Results:  Relevant Lab Results:   Additional Information SS #: [id number]  Tashira Torre E Devante Capano, LCSW

## 2020-06-10 NOTE — Discharge Summary (Signed)
Physician Discharge Summary  Eric [name] ZOX:[id number] DOB: 1945/10/20 DOA: 06/08/2020  PCP: Almetta Lovely, [facility]  Admit date: 06/08/2020 Discharge date: 06/10/2020  Admitted From: SNF Disposition:  SNF  Recommendations for Outpatient Follow-up:  1. Follow up with PCP in 1-2 weeks 2. Please obtain BMP/CBC in one week 3. Please follow up on the following pending results:None  Home Health: No Equipment/Devices: Rolling walker Discharge Condition: Fair CODE STATUS: Full Diet recommendation: Heart Healthy / Carb Modified   Brief/Interim Summary: Eric [name] a74 y.o.Caucasian male, [facility] multiple medical problems that are mentioned below including hypothyroidism, hypertension, CHF, dementia, atrial fibrillation, and anxiety and depression, presented to the emergency room with acute onset of right lower extremity worsening redness and swelling.  Lower extremity Doppler was negative for DVT. Blood cultures remain negative.  He remained afebrile.  He was treated with ceftriaxone while in the hospital, responded well with significant improvement in erythema and edema of right lower extremity.  Continue to have some edema and erythema before discharge.  Patient MRSA swab for screening was positive.  He was discharged on cefdinir and can also continue doxycycline to complete the course.  Patient has an history of chronic HFpEF. EF according to echo done in January 2020 was 50, within lower normal limit.  Patient has diastolic dysfunction.  There was some concern of asymmetric pulmonary edema and he was started on IV diuresis.  BNP was 18.4 which was not consistent with CHF exacerbation.  He will continue with his home dose of Lasix.  And follow-up with heart failure clinic.  Patient has an history of behavioral changes and dementia.  He did become irritable while in the hospital requiring some sedation.  He will continue  his home medications and will follow up with his providers.  Discharge Diagnoses:  Active Problems:   Cellulitis of right lower extremity  Discharge Instructions  Discharge Instructions    Diet - low sodium heart healthy   Complete by: As directed    Increase activity slowly   Complete by: As directed      Allergies as of 06/10/2020      Reactions   Demerol [meperidine] Other (See Comments)   Will counteract with a drug he is taking causing fatal reaction with nardil   Epinephrine Other (See Comments)   "Nardil reaction-With epi?" "Allergic," per MAR   Nardil [phenelzine] Other (See Comments)   "Allergic," per MAR   Morphine And Related Other (See Comments)   Reacts with nardil- "Allergic," per MAR   Ondansetron Other (See Comments)   Made patient want to climb the walls.   Shellfish Allergy Hives      Medication List    TAKE these medications   acetaminophen 325 MG tablet Commonly known as: TYLENOL Take 2 tablets (650 mg total) by mouth every 6 (six) hours as needed for mild pain (or Fever >/= 101). What changed: reasons to take this   albuterol 108 (90 Base) MCG/ACT inhaler Commonly known as: VENTOLIN HFA Inhale 2 puffs into the lungs every 6 (six) hours as needed for wheezing or shortness of breath. What changed: how much to take   Antacid 200-200-20 MG/5ML suspension Generic drug: alum & mag hydroxide-simeth Take 30 mLs by mouth 4 (four) times daily as needed for indigestion or heartburn.   ascorbic acid 500 MG tablet Commonly known as: VITAMIN C Take 500 mg by mouth 2 (two) times daily.   cefdinir 300 MG capsule  Commonly known as: OMNICEF Take 1 capsule (300 mg total) by mouth every 12 (twelve) hours for 7 days.   collagenase ointment Commonly known as: SANTYL Apply topically daily. Apply to right heel ulcer plus dry dressing change daily   diazepam 2 MG tablet Commonly known as: VALIUM Take 0.5-1 tablets (1-2 mg total) by mouth See admin  instructions. Take 1 mg by mouth in the morning and 2 mg at bedtime   Diphenhist 25 MG tablet Generic drug: diphenhydrAMINE Take 25 mg by mouth every 6 (six) hours as needed (for allergic reactions).   doxycycline 100 MG capsule Commonly known as: VIBRAMYCIN Take 1 capsule (100 mg total) by mouth 2 (two) times daily for 10 days.   eucerin cream Apply 1 application topically See admin instructions. Apply to both legs 2 times a day   furosemide 20 MG tablet Commonly known as: LASIX Take 2 tablets (40 mg total) by mouth daily.   guaiFENesin 100 MG/5ML liquid Commonly known as: ROBITUSSIN Take 300 mg by mouth every 6 (six) hours as needed for cough.   levothyroxine 200 MCG tablet Commonly known as: SYNTHROID Take 1 tablet (200 mcg total) by mouth daily before breakfast.   lidocaine 1 % (with preservative) injection Commonly known as: XYLOCAINE by Other route once. Used to mix with rocephin   liver oil-zinc oxide 40 % ointment Commonly known as: DESITIN Apply topically daily. What changed:   how much to take  when to take this  additional instructions   loperamide 2 MG tablet Commonly known as: IMODIUM A-D Take 4 mg by mouth every 3 (three) hours as needed for diarrhea or loose stools (CANNOT EXCEED 8 DOSES/24 HOURS).   metoCLOPramide 5 MG tablet Commonly known as: REGLAN Take 5 mg by mouth every 6 (six) hours as needed for nausea or vomiting.   Milk of Magnesia 400 MG/5ML suspension Generic drug: magnesium hydroxide Take 30 mLs by mouth daily as needed for mild constipation.   mupirocin ointment 2 % Commonly known as: BACTROBAN Place 1 application into the nose 2 (two) times daily.   nystatin cream Commonly known as: MYCOSTATIN Apply 1 application topically 2 (two) times a week.   OLANZapine zydis 10 MG disintegrating tablet Commonly known as: ZYPREXA Take 1 tablet (10 mg total) by mouth 2 (two) times daily. What changed:   how much to take  when to take  this   OXYGEN Inhale 2 L/min into the lungs See admin instructions. "2 L/min, as tolerated"   sertraline 25 MG tablet Commonly known as: ZOLOFT Take 1 tablet (25 mg total) by mouth daily.   Theratrum Complete Tabs Take 1 tablet by mouth daily with breakfast.       Follow-up Information    [facility] Follow up on 06/23/2020.   Specialty: Cardiology Why: at 2:00pm. Enter through the Medical Mall entrance Contact information: [address] [place] [address] [phone]       [facility], [facility]. Schedule an appointment as soon as possible for a visit.   Specialty: Geriatric Medicine Contact information: [address] [place] [address] [phone]              Allergies  Allergen Reactions  . Demerol [Meperidine] Other (See Comments)    Will counteract with a drug he is taking causing fatal reaction with nardil  . Epinephrine Other (See Comments)    "Nardil reaction-With epi?" "Allergic," per MAR  .  Nardil [Phenelzine] Other (See Comments)    "Allergic," per MAR  . Morphine And Related Other (See Comments)    Reacts with nardil- "Allergic," per MAR  . Ondansetron Other (See Comments)    Made patient want to climb the walls.  Marland Kitchen Shellfish Allergy Hives    Consultations:  None  Procedures/Studies: CT ABDOMEN PELVIS W CONTRAST  Addendum Date: 06/04/2020   ADDENDUM REPORT: 06/04/2020 20:35 ADDENDUM: In the impression it should state right lower anterior abdominal wall hernia containing a small portion of small bowel WITHOUT evidence of strangulation. Electronically Signed   By: Jonna Clark M.D.   On: 06/04/2020 20:35   Result Date: 06/04/2020 CLINICAL DATA:  Abdominal pain EXAM: CT ABDOMEN AND PELVIS WITH CONTRAST TECHNIQUE: Multidetector CT imaging of the abdomen and pelvis was performed using the standard protocol following bolus administration of  intravenous contrast. CONTRAST:  OMNIPAQUE IOHEXOL 300 MG/ML  SOLN COMPARISON:  None. FINDINGS: Lower chest: The visualized heart size within normal limits. No pericardial fluid/thickening. No hiatal hernia. The visualized portions of the lungs are clear. Hepatobiliary: The liver is normal in density without focal abnormality.The main portal vein is patent. No evidence of calcified gallstones, gallbladder wall thickening or biliary dilatation. Pancreas: Unremarkable. No pancreatic ductal dilatation or surrounding inflammatory changes. Spleen: Normal in size without focal abnormality. Adrenals/Urinary Tract: Both adrenal glands appear normal. The kidneys and collecting system appear normal without evidence of urinary tract calculus or hydronephrosis. Bladder is unremarkable. Stomach/Bowel: The stomach, small bowel, and colon are normal in appearance. No inflammatory changes, wall thickening, or obstructive findings. There is a moderate amount stool present. There is a small rectal ball present. Vascular/Lymphatic: There are no enlarged mesenteric, retroperitoneal, or pelvic lymph nodes. Scattered aortic atherosclerotic calcifications are seen without aneurysmal dilatation. Reproductive: The prostate is unremarkable. Other: There is a right anterior abdominal hernia containing a small portion of and small. Musculoskeletal: No acute or significant osseous findings. IMPRESSION: Moderate to large amount colonic stool with a rectal fecal ball. No definite evidence obstruction. 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 Electronically Signed: By: Jonna Clark M.D. On: 06/04/2020 20:04   US Venous Img Lower Unilateral Right  Result Date: 06/08/2020 CLINICAL DATA:  Initial evaluation for acute right leg pain, redness. History of cellulitis and venous stasis. EXAM: RIGHT LOWER EXTREMITY VENOUS DOPPLER  ULTRASOUND TECHNIQUE: Gray-scale sonography with graded compression, as well as color Doppler and duplex ultrasound were performed to evaluate the lower extremity deep venous systems from the level of the common femoral vein and including the common femoral, femoral, profunda femoral, popliteal and calf veins including the posterior tibial, peroneal and gastrocnemius veins when visible. The superficial great saphenous vein was also interrogated. Spectral Doppler was utilized to evaluate flow at rest and with distal augmentation maneuvers in the common femoral, femoral and popliteal veins. COMPARISON:  None. FINDINGS: Contralateral Common Femoral Vein: Respiratory phasicity is normal and symmetric with the symptomatic side. No evidence of thrombus. Normal compressibility. Common Femoral Vein: No evidence of thrombus. Normal compressibility, respiratory phasicity and response to augmentation. Saphenofemoral Junction: No evidence of thrombus. Normal compressibility and flow on color Doppler imaging. Profunda Femoral Vein: No evidence of thrombus. Normal compressibility and flow on color Doppler imaging. Femoral Vein: No evidence of thrombus. Normal compressibility, respiratory phasicity and response to augmentation. Popliteal Vein: No evidence of thrombus. Normal compressibility, respiratory phasicity and response to augmentation. Calf Veins: Veins of the  calf are somewhat difficult to visualize on this exam. No appreciable thrombus. Normal compressibility and flow on color Doppler imaging. Superficial Great Saphenous Vein: No evidence of thrombus. Normal compressibility. Venous Reflux:  None. Other Findings:  None. IMPRESSION: No evidence of deep venous thrombosis. Electronically Signed   By: Rise Mu M.D.   On: 06/08/2020 23:23   DG Chest Portable 1 View  Result Date: 06/08/2020 CLINICAL DATA:  Initial evaluation for peripheral edema. EXAM: PORTABLE CHEST 1 VIEW COMPARISON:  Prior radiograph from  05/01/2020 FINDINGS: Patient is rotated to the left. Cardiomegaly, stable from previous. Mediastinal silhouette within normal limits. Mild tortuosity the intrathoracic aorta noted. Lungs normally inflated. Asymmetric hazy opacity with pulmonary vascular prominence noted within the left lung, favored to reflect asymmetric pulmonary interstitial edema. This finding may part be accentuated by patient rotation. No visible pleural effusion. No consolidative airspace disease. No pneumothorax. No acute osseous finding. Degenerative changes noted about the visualized shoulders. IMPRESSION: Cardiomegaly with asymmetric hazy opacity within the left lung, favored to reflect asymmetric pulmonary interstitial edema. This finding may in part be accentuated by patient rotation. Electronically Signed   By: Rise Mu M.D.   On: 06/08/2020 23:27     Subjective: Patient was resting comfortably when seen today.  Easily arousable, no new complaint.  He lost his IV and not letting nursing staff replace it.  Discharge Exam: Vitals:   06/10/20 0757 06/10/20 1255  BP: 102/68 122/78  Pulse: 69 83  Resp: 17 16  Temp: 98.1 F (36.7 C) 98.2 F (36.8 C)  SpO2: 96% 92%   Vitals:   06/10/20 0431 06/10/20 0500 06/10/20 0757 06/10/20 1255  BP: (!) 102/59  102/68 122/78  Pulse: 83  69 83  Resp: 16  17 16   Temp: 98 F (36.7 C)  98.1 F (36.7 C) 98.2 F (36.8 C)  TempSrc:   Oral Oral  SpO2: 91%  96% 92%  Weight:  107.8 kg    Height:        General: Pt is alert, awake, not in acute distress Cardiovascular: RRR, S1/S2 +, no rubs, no gallops Respiratory: CTA bilaterally, no wheezing, no rhonchi Abdominal: Soft, NT, ND, bowel sounds + Extremities: no edema, no cyanosis, right lower extremity with some edema and erythema, improved significantly since admission.   The results of significant diagnostics from this hospitalization (including imaging, microbiology, ancillary and laboratory) are listed below for  reference.    Microbiology: Recent Results (from the past 240 hour(s))  Respiratory Panel by RT PCR (Flu A&B, Covid) - Nasopharyngeal Swab     Status: None   Collection Time: 06/04/20  6:44 PM   Specimen: Nasopharyngeal Swab  Result Value Ref Range Status   SARS Coronavirus 2 by RT PCR NEGATIVE NEGATIVE Final    Comment: (NOTE) SARS-CoV-2 target nucleic acids are NOT DETECTED.  The SARS-CoV-2 RNA is generally detectable in upper respiratoy specimens during the acute phase of infection. The lowest concentration of SARS-CoV-2 viral copies this assay can detect is 131 copies/mL. A negative result does not preclude SARS-Cov-2 infection and should not be used as the sole basis for treatment or other patient management decisions. A negative result may occur with  improper specimen collection/handling, submission of specimen other than nasopharyngeal swab, presence of viral mutation(s) within the areas targeted by this assay, and inadequate number of viral copies (<131 copies/mL). A negative result must be combined with clinical observations, patient history, and epidemiological information. The expected result is Negative.  Fact Sheet  for Patients:  https://www.moore.com/  Fact Sheet for Healthcare Providers:  https://www.young.biz/  This test is no t yet approved or cleared by the [place] FDA and  has been authorized for detection and/or diagnosis of SARS-CoV-2 by FDA under an Emergency Use Authorization (EUA). This EUA will remain  in effect (meaning this test can be used) for the duration of the COVID-19 declaration under Section 564(b)(1) of the Act, 21 U.S.C. section 360bbb-3(b)(1), unless the authorization is terminated or revoked sooner.     Influenza A by PCR NEGATIVE NEGATIVE Final   Influenza B by PCR NEGATIVE NEGATIVE Final    Comment: (NOTE) The Xpert Xpress SARS-CoV-2/FLU/RSV assay is intended as an aid in  the diagnosis  of influenza from Nasopharyngeal swab specimens and  should not be used as a sole basis for treatment. Nasal washings and  aspirates are unacceptable for Xpert Xpress SARS-CoV-2/FLU/RSV  testing.  Fact Sheet for Patients: https://www.moore.com/  Fact Sheet for Healthcare Providers: https://www.young.biz/  This test is not yet approved or cleared by the [place] FDA and  has been authorized for detection and/or diagnosis of SARS-CoV-2 by  FDA under an Emergency Use Authorization (EUA). This EUA will remain  in effect (meaning this test can be used) for the duration of the  Covid-19 declaration under Section 564(b)(1) of the Act, 21  U.S.C. section 360bbb-3(b)(1), unless the authorization is  terminated or revoked. Performed at [facility], [address] [place], [place] [address]   Culture, blood (Routine x 2)     Status: None (Preliminary result)   Collection Time: 06/08/20 10:08 PM   Specimen: BLOOD  Result Value Ref Range Status   Specimen Description BLOOD RIGHT [facility]  Final   Special Requests   Final    BOTTLES DRAWN AEROBIC AND ANAEROBIC Blood Culture results may not be optimal due to an excessive volume of blood received in culture bottles   Culture   Final    NO GROWTH 2 DAYS Performed at [facility], [address] [place], [place] [address]    Report Status PENDING  Incomplete  Culture, blood (Routine x 2)     Status: None (Preliminary result)   Collection Time: 06/08/20 10:08 PM   Specimen: BLOOD  Result Value Ref Range Status   Specimen Description BLOOD LEFT HAND  Final   Special Requests   Final    BOTTLES DRAWN AEROBIC AND ANAEROBIC Blood Culture adequate volume   Culture   Final    NO GROWTH 2 DAYS Performed at [facility], [address] [place], [place] [address]    Report Status PENDING  Incomplete  Resp Panel by RT-PCR (Flu A&B, Covid) Nasopharyngeal Swab     Status: None    Collection Time: 06/08/20 10:10 PM   Specimen: Nasopharyngeal Swab; Nasopharyngeal(NP) swabs in vial transport medium  Result Value Ref Range Status   SARS Coronavirus 2 by RT PCR NEGATIVE NEGATIVE Final    Comment: (NOTE) SARS-CoV-2 target nucleic acids are NOT DETECTED.  The SARS-CoV-2 RNA is generally detectable in upper respiratory specimens during the acute phase of infection. The lowest concentration of SARS-CoV-2 viral copies this assay can detect is 138 copies/mL. A negative result does not preclude SARS-Cov-2 infection and should not be used as the sole basis for treatment or other patient management decisions. A negative result may occur with  improper specimen collection/handling, submission of specimen other than nasopharyngeal swab, presence of viral mutation(s) within the areas targeted by this assay,  and inadequate number of viral copies(<138 copies/mL). A negative result must be combined with clinical observations, patient history, and epidemiological information. The expected result is Negative.  Fact Sheet for Patients:  BloggerCourse.com  Fact Sheet for Healthcare Providers:  SeriousBroker.it  This test is no t yet approved or cleared by the [place] FDA and  has been authorized for detection and/or diagnosis of SARS-CoV-2 by FDA under an Emergency Use Authorization (EUA). This EUA will remain  in effect (meaning this test can be used) for the duration of the COVID-19 declaration under Section 564(b)(1) of the Act, 21 U.S.C.section 360bbb-3(b)(1), unless the authorization is terminated  or revoked sooner.       Influenza A by PCR NEGATIVE NEGATIVE Final   Influenza B by PCR NEGATIVE NEGATIVE Final    Comment: (NOTE) The Xpert Xpress SARS-CoV-2/FLU/RSV plus assay is intended as an aid in the diagnosis of influenza from Nasopharyngeal swab specimens and should not be used as a sole basis for treatment.  Nasal washings and aspirates are unacceptable for Xpert Xpress SARS-CoV-2/FLU/RSV testing.  Fact Sheet for Patients: BloggerCourse.com  Fact Sheet for Healthcare Providers: SeriousBroker.it  This test is not yet approved or cleared by the [place] FDA and has been authorized for detection and/or diagnosis of SARS-CoV-2 by FDA under an Emergency Use Authorization (EUA). This EUA will remain in effect (meaning this test can be used) for the duration of the COVID-19 declaration under Section 564(b)(1) of the Act, 21 U.S.C. section 360bbb-3(b)(1), unless the authorization is terminated or revoked.  Performed at [facility], [address] [place], [place] [address]   MRSA PCR Screening     Status: Abnormal   Collection Time: 06/09/20 12:52 AM   Specimen: Nasopharyngeal  Result Value Ref Range Status   MRSA by PCR POSITIVE (A) NEGATIVE Final    Comment:        The GeneXpert MRSA Assay (FDA approved for NASAL specimens only), is one component of a comprehensive MRSA colonization surveillance program. It is not intended to diagnose MRSA infection nor to guide or monitor treatment for MRSA infections. RESULT CALLED TO, READ BACK BY AND VERIFIED WITH: DEBRA EMDEN RN AT 0207 ON 06/09/20 [facility] Performed at [facility], [address] [place], [place] [address]      Labs: BNP (last 3 results) Recent Labs    12/04/19 2051 02/04/20 0859 06/08/20 2208  BNP 23.5 178.8* 18.4   Basic Metabolic Panel: Recent Labs  Lab 06/04/20 1840 06/08/20 2208 06/09/20 0201  NA 137 140  --   K 4.1 4.2  --   CL 101 104  --   CO2 26 26  --   GLUCOSE 119* 135*  --   BUN 20 20  --   CREATININE 1.00 0.72  --   CALCIUM 8.2* 7.9*  --   MG  --   --  2.1   Liver Function Tests: Recent Labs  Lab 06/04/20 1840 06/08/20 2208  AST 13* 13*  ALT 9 8  ALKPHOS 65 64  BILITOT 1.0 0.5  PROT 6.5 6.4*  ALBUMIN 3.2* 3.1*    Recent Labs  Lab 06/04/20 1840  LIPASE 23   No results for input(s): AMMONIA in the last 168 hours. CBC: Recent Labs  Lab 06/04/20 1840 06/08/20 2208 06/09/20 0201  WBC 10.4 9.7 10.8*  NEUTROABS 8.7* 8.0*  --   HGB 12.0* 11.8* 12.7*  HCT 37.2* 37.5* 39.9  MCV 91.9 92.8 91.5  PLT 182 207  213   Cardiac Enzymes: No results for input(s): CKTOTAL, CKMB, CKMBINDEX, TROPONINI in the last 168 hours. BNP: Invalid input(s): POCBNP CBG: No results for input(s): GLUCAP in the last 168 hours. D-Dimer No results for input(s): DDIMER in the last 72 hours. Hgb A1c Recent Labs    06/09/20 0201  HGBA1C 5.2   Lipid Profile Recent Labs    06/09/20 0201  CHOL 128  HDL 49  LDLCALC 71  TRIG 38  CHOLHDL 2.6   Thyroid function studies Recent Labs    06/09/20 0201  TSH 3.690   Anemia work up No results for input(s): VITAMINB12, FOLATE, FERRITIN, TIBC, IRON, RETICCTPCT in the last 72 hours. Urinalysis    Component Value Date/Time   COLORURINE YELLOW (A) 01/18/2020 1755   APPEARANCEUR CLEAR (A) 01/18/2020 1755   LABSPEC 1.015 01/18/2020 1755   PHURINE 5.0 01/18/2020 1755   GLUCOSEU NEGATIVE 01/18/2020 1755   HGBUR NEGATIVE 01/18/2020 1755   BILIRUBINUR NEGATIVE 01/18/2020 1755   KETONESUR NEGATIVE 01/18/2020 1755   PROTEINUR NEGATIVE 01/18/2020 1755   UROBILINOGEN 0.2 08/17/2011 2150   NITRITE NEGATIVE 01/18/2020 1755   LEUKOCYTESUR NEGATIVE 01/18/2020 1755   Sepsis Labs Invalid input(s): PROCALCITONIN,  WBC,  LACTICIDVEN Microbiology Recent Results (from the past 240 hour(s))  Respiratory Panel by RT PCR (Flu A&B, Covid) - Nasopharyngeal Swab     Status: None   Collection Time: 06/04/20  6:44 PM   Specimen: Nasopharyngeal Swab  Result Value Ref Range Status   SARS Coronavirus 2 by RT PCR NEGATIVE NEGATIVE Final    Comment: (NOTE) SARS-CoV-2 target nucleic acids are NOT DETECTED.  The SARS-CoV-2 RNA is generally detectable in upper respiratoy specimens during  the acute phase of infection. The lowest concentration of SARS-CoV-2 viral copies this assay can detect is 131 copies/mL. A negative result does not preclude SARS-Cov-2 infection and should not be used as the sole basis for treatment or other patient management decisions. A negative result may occur with  improper specimen collection/handling, submission of specimen other than nasopharyngeal swab, presence of viral mutation(s) within the areas targeted by this assay, and inadequate number of viral copies (<131 copies/mL). A negative result must be combined with clinical observations, patient history, and epidemiological information. The expected result is Negative.  Fact Sheet for Patients:  https://www.moore.com/  Fact Sheet for Healthcare Providers:  https://www.young.biz/  This test is no t yet approved or cleared by the [place] FDA and  has been authorized for detection and/or diagnosis of SARS-CoV-2 by FDA under an Emergency Use Authorization (EUA). This EUA will remain  in effect (meaning this test can be used) for the duration of the COVID-19 declaration under Section 564(b)(1) of the Act, 21 U.S.C. section 360bbb-3(b)(1), unless the authorization is terminated or revoked sooner.     Influenza A by PCR NEGATIVE NEGATIVE Final   Influenza B by PCR NEGATIVE NEGATIVE Final    Comment: (NOTE) The Xpert Xpress SARS-CoV-2/FLU/RSV assay is intended as an aid in  the diagnosis of influenza from Nasopharyngeal swab specimens and  should not be used as a sole basis for treatment. Nasal washings and  aspirates are unacceptable for Xpert Xpress SARS-CoV-2/FLU/RSV  testing.  Fact Sheet for Patients: https://www.moore.com/  Fact Sheet for Healthcare Providers: https://www.young.biz/  This test is not yet approved or cleared by the [place] FDA and  has been authorized for detection and/or  diagnosis of SARS-CoV-2 by  FDA under an Emergency Use Authorization (EUA). This EUA will remain  in effect (  meaning this test can be used) for the duration of the  Covid-19 declaration under Section 564(b)(1) of the Act, 21  U.S.C. section 360bbb-3(b)(1), unless the authorization is  terminated or revoked. Performed at [facility], [address] [place], [place] [address]   Culture, blood (Routine x 2)     Status: None (Preliminary result)   Collection Time: 06/08/20 10:08 PM   Specimen: BLOOD  Result Value Ref Range Status   Specimen Description BLOOD RIGHT [facility]  Final   Special Requests   Final    BOTTLES DRAWN AEROBIC AND ANAEROBIC Blood Culture results may not be optimal due to an excessive volume of blood received in culture bottles   Culture   Final    NO GROWTH 2 DAYS Performed at [facility], [address], [place], [place] [address]    Report Status PENDING  Incomplete  Culture, blood (Routine x 2)     Status: None (Preliminary result)   Collection Time: 06/08/20 10:08 PM   Specimen: BLOOD  Result Value Ref Range Status   Specimen Description BLOOD LEFT HAND  Final   Special Requests   Final    BOTTLES DRAWN AEROBIC AND ANAEROBIC Blood Culture adequate volume   Culture   Final    NO GROWTH 2 DAYS Performed at [facility], [address] [place], [place] [address]    Report Status PENDING  Incomplete  Resp Panel by RT-PCR (Flu A&B, Covid) Nasopharyngeal Swab     Status: None   Collection Time: 06/08/20 10:10 PM   Specimen: Nasopharyngeal Swab; Nasopharyngeal(NP) swabs in vial transport medium  Result Value Ref Range Status   SARS Coronavirus 2 by RT PCR NEGATIVE NEGATIVE Final    Comment: (NOTE) SARS-CoV-2 target nucleic acids are NOT DETECTED.  The SARS-CoV-2 RNA is generally detectable in upper respiratory specimens during the acute phase of infection. The lowest concentration of SARS-CoV-2 viral copies this assay can detect  is 138 copies/mL. A negative result does not preclude SARS-Cov-2 infection and should not be used as the sole basis for treatment or other patient management decisions. A negative result may occur with  improper specimen collection/handling, submission of specimen other than nasopharyngeal swab, presence of viral mutation(s) within the areas targeted by this assay, and inadequate number of viral copies(<138 copies/mL). A negative result must be combined with clinical observations, patient history, and epidemiological information. The expected result is Negative.  Fact Sheet for Patients:  BloggerCourse.com  Fact Sheet for Healthcare Providers:  SeriousBroker.it  This test is no t yet approved or cleared by the [place] FDA and  has been authorized for detection and/or diagnosis of SARS-CoV-2 by FDA under an Emergency Use Authorization (EUA). This EUA will remain  in effect (meaning this test can be used) for the duration of the COVID-19 declaration under Section 564(b)(1) of the Act, 21 U.S.C.section 360bbb-3(b)(1), unless the authorization is terminated  or revoked sooner.       Influenza A by PCR NEGATIVE NEGATIVE Final   Influenza B by PCR NEGATIVE NEGATIVE Final    Comment: (NOTE) The Xpert Xpress SARS-CoV-2/FLU/RSV plus assay is intended as an aid in the diagnosis of influenza from Nasopharyngeal swab specimens and should not be used as a sole basis for treatment. Nasal washings and aspirates are unacceptable for Xpert Xpress SARS-CoV-2/FLU/RSV testing.  Fact Sheet for Patients: BloggerCourse.com  Fact Sheet for Healthcare Providers: SeriousBroker.it  This test is not yet approved or cleared by the [place] FDA  and has been authorized for detection and/or diagnosis of SARS-CoV-2 by FDA under an Emergency Use Authorization (EUA). This EUA will remain in effect  (meaning this test can be used) for the duration of the COVID-19 declaration under Section 564(b)(1) of the Act, 21 U.S.C. section 360bbb-3(b)(1), unless the authorization is terminated or revoked.  Performed at [facility], [address] [place], [place] [address]   MRSA PCR Screening     Status: Abnormal   Collection Time: 06/09/20 12:52 AM   Specimen: Nasopharyngeal  Result Value Ref Range Status   MRSA by PCR POSITIVE (A) NEGATIVE Final    Comment:        The GeneXpert MRSA Assay (FDA approved for NASAL specimens only), is one component of a comprehensive MRSA colonization surveillance program. It is not intended to diagnose MRSA infection nor to guide or monitor treatment for MRSA infections. RESULT CALLED TO, READ BACK BY AND VERIFIED WITH: DEBRA EMDEN RN AT 0207 ON 06/09/20 [facility] Performed at [facility], [address] [place], [place] [address]     Time coordinating discharge: Over 30 minutes  SIGNED:  Arnetha CourserSumayya Evangelia Whitaker, MD  [facility] 06/10/2020, 12:59 PM  If 7PM-7AM, please contact night-coverage [email or web address]  This record has been created using Conservation officer, historic buildingsDragon voice recognition software. Errors have been sought and corrected,but may not always be located. Such creation errors do not reflect on the standard of care.

## 2020-06-13 LAB — CULTURE, BLOOD (ROUTINE X 2)
Culture: NO GROWTH
Culture: NO GROWTH
Special Requests: ADEQUATE

## 2020-06-21 NOTE — Progress Notes (Deleted)
   Patient ID: [name] III, male    DOB: Jul 28, 1945, 74 y.o.   MRN: [id number]  HPI  Mr Pembleton is a 74 y/o male with a history of  Echo report from 08/03/2018 reviewed and showed an EF of 50% with mild LVH  Admitted 06/08/20 due to shortness of breath and pedal edema. Ultrasound negative for DVT. Antibiotics given for cellulitis. +MRSA. Given IV lasix with transition back to oral diuretics. Discharged after 2 days.   He presents today for his initial visit with a chief complaint of   Review of Systems    Physical Exam  Assessment & Plan:  1: Chronic heart failure with preserved ejection fraction with structural changes (LVH)- - NYHA class - BNP 06/08/20 was 18.4  2: HTN- - BP - sees PCP with [facility] - BMP 06/08/20 reviewed and showed sodium 140, potassium 4.2, creatinine 0.72 and GFR >60  3: Atrial fibrillation-  4: Dementia-

## 2020-06-23 ENCOUNTER — Ambulatory Visit: Payer: Medicare Other | Admitting: Family

## 2020-07-12 IMAGING — DX DG CHEST 1V PORT
1 series · 1 of 1 positions shown · non-contrast
Comparison: Radiograph 05/12/2019

CLINICAL DATA: Shortness of breath

EXAM:
PORTABLE CHEST 1 VIEW

[chest ap]
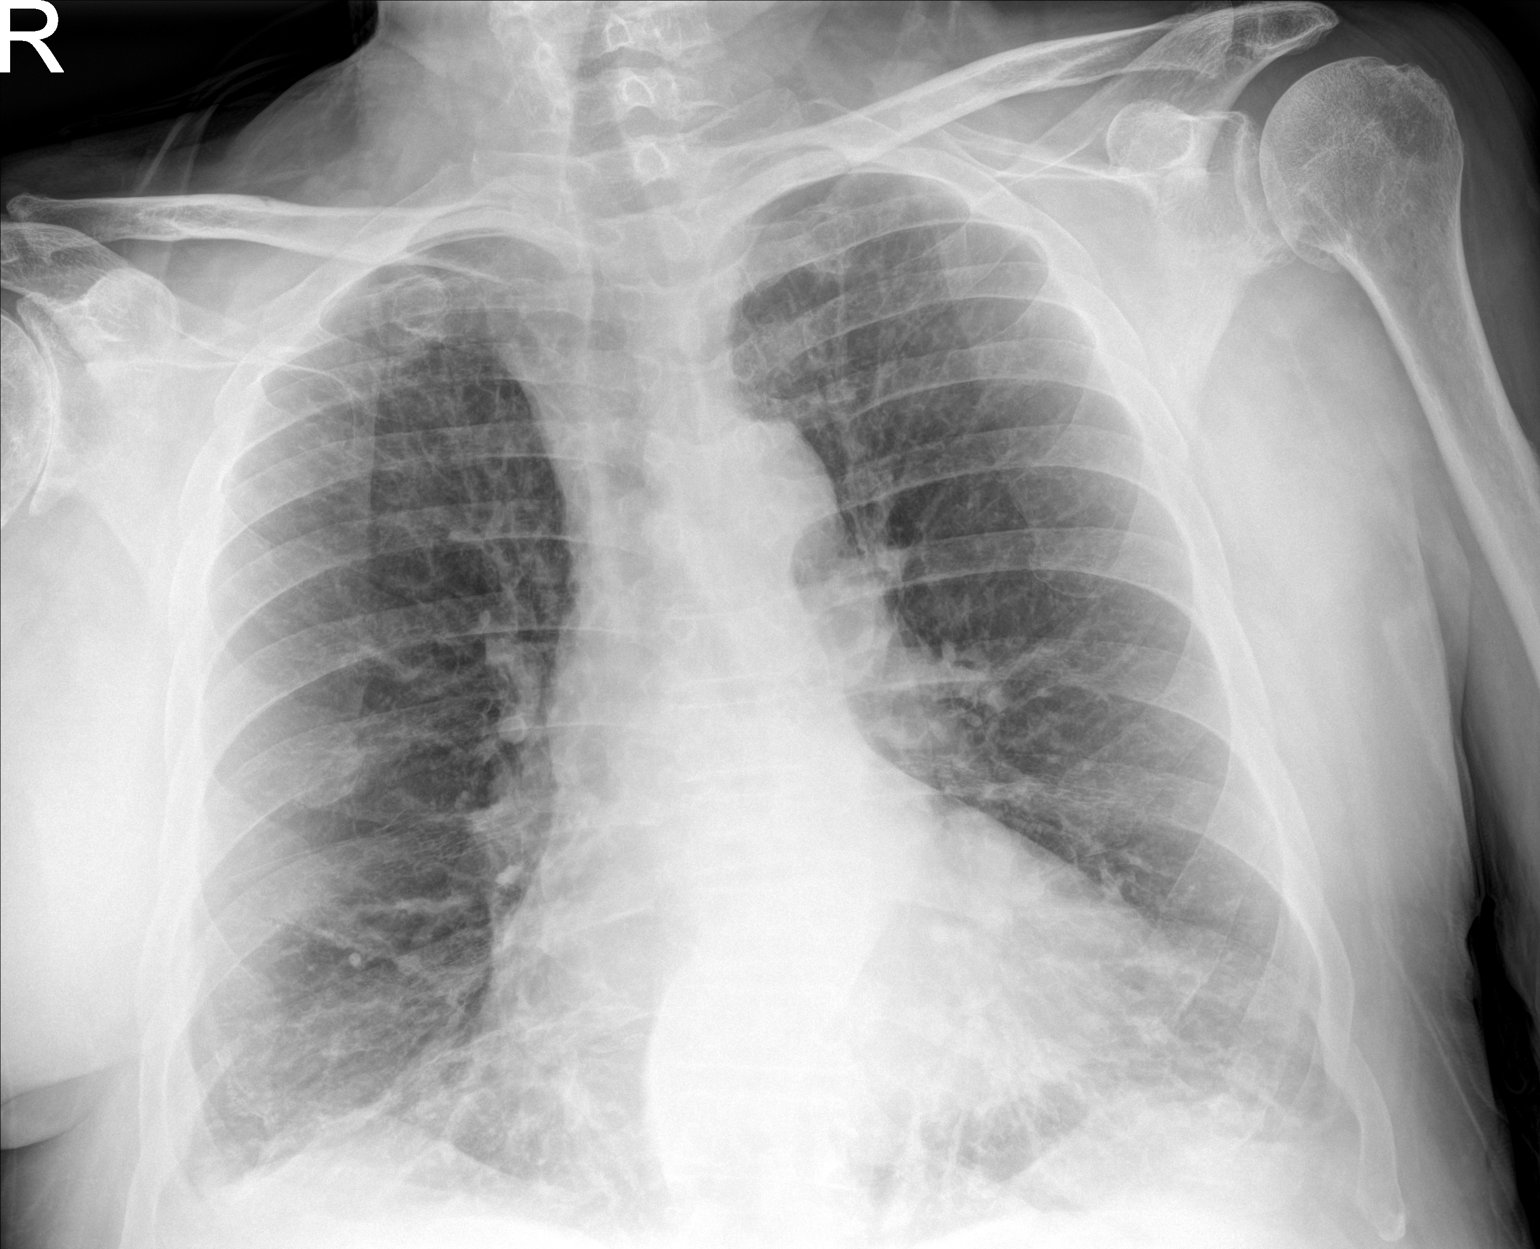

[1 of 1 positions shown; findings below may reference images not displayed]

FINDINGS: There is chronic interstitial opacity which is similar to prior
exams. No new focal consolidative opacity is seen. No pneumothorax
or effusion. There is slight tortuosity of the thoracic aorta.
Cardiac size is similar to priors accounting for differences in
technique. No acute osseous or soft tissue abnormality. Degenerative
changes are present in the imaged spine and shoulders.
IMPRESSION: 1. Stable chronic interstitial changes. No new focal consolidation
or other acute lung abnormality.

## 2020-07-13 ENCOUNTER — Other Ambulatory Visit: Payer: Self-pay

## 2020-07-13 ENCOUNTER — Emergency Department
Admission: EM | Admit: 2020-07-13 | Discharge: 2020-07-13 | Disposition: A | Discharge status: Home / Self Care | Payer: Medicare Other | Attending: Student in an Organized Health Care Education/Training Program | Admitting: Student in an Organized Health Care Education/Training Program

## 2020-07-13 ENCOUNTER — Encounter: Payer: Self-pay | Admitting: Emergency Medicine

## 2020-07-13 ENCOUNTER — Emergency Department: Payer: Medicare Other

## 2020-07-13 ENCOUNTER — Emergency Department
Admission: EM | Admit: 2020-07-13 | Discharge: 2020-07-13 | Disposition: A | Discharge status: Home / Self Care | Payer: Medicare Other | Source: Home / Self Care | Attending: Emergency Medicine | Admitting: Emergency Medicine

## 2020-07-13 DIAGNOSIS — Z8616 Personal history of COVID-19: Secondary | ICD-10-CM | POA: Insufficient documentation

## 2020-07-13 DIAGNOSIS — I5042 Chronic combined systolic (congestive) and diastolic (congestive) heart failure: Secondary | ICD-10-CM | POA: Diagnosis not present

## 2020-07-13 DIAGNOSIS — I110 Hypertensive heart disease with heart failure: Secondary | ICD-10-CM | POA: Insufficient documentation

## 2020-07-13 DIAGNOSIS — G8929 Other chronic pain: Secondary | ICD-10-CM | POA: Diagnosis not present

## 2020-07-13 DIAGNOSIS — Z79899 Other long term (current) drug therapy: Secondary | ICD-10-CM | POA: Insufficient documentation

## 2020-07-13 DIAGNOSIS — W19XXXA Unspecified fall, initial encounter: Secondary | ICD-10-CM

## 2020-07-13 DIAGNOSIS — E039 Hypothyroidism, unspecified: Secondary | ICD-10-CM | POA: Insufficient documentation

## 2020-07-13 DIAGNOSIS — I8311 Varicose veins of right lower extremity with inflammation: Secondary | ICD-10-CM | POA: Diagnosis not present

## 2020-07-13 DIAGNOSIS — F0390 Unspecified dementia without behavioral disturbance: Secondary | ICD-10-CM | POA: Insufficient documentation

## 2020-07-13 DIAGNOSIS — M79604 Pain in right leg: Secondary | ICD-10-CM | POA: Diagnosis present

## 2020-07-13 DIAGNOSIS — Z87891 Personal history of nicotine dependence: Secondary | ICD-10-CM | POA: Insufficient documentation

## 2020-07-13 DIAGNOSIS — I872 Venous insufficiency (chronic) (peripheral): Secondary | ICD-10-CM

## 2020-07-13 LAB — COMPREHENSIVE METABOLIC PANEL
ALT: 10 U/L (ref 0–44)
AST: 13 U/L — ABNORMAL LOW (ref 15–41)
Albumin: 3.3 g/dL — ABNORMAL LOW (ref 3.5–5.0)
Alkaline Phosphatase: 64 U/L (ref 38–126)
Anion gap: 6 (ref 5–15)
BUN: 16 mg/dL (ref 8–23)
CO2: 29 mmol/L (ref 22–32)
Calcium: 8.2 mg/dL — ABNORMAL LOW (ref 8.9–10.3)
Chloride: 102 mmol/L (ref 98–111)
Creatinine, Ser: 0.81 mg/dL (ref 0.61–1.24)
GFR, Estimated: >60 mL/min (ref >60)
Glucose, Bld: 91 mg/dL (ref 70–99)
Potassium: 4.1 mmol/L (ref 3.5–5.1)
Sodium: 137 mmol/L (ref 135–145)
Total Bilirubin: 1.3 mg/dL — ABNORMAL HIGH (ref 0.3–1.2)
Total Protein: 6.7 g/dL (ref 6.5–8.1)

## 2020-07-13 LAB — CBC WITH DIFFERENTIAL/PLATELET
Abs Immature Granulocytes: 0.04 10*3/uL (ref 0.00–0.07)
Basophils Absolute: 0 10*3/uL (ref 0.0–0.1)
Basophils Relative: 0 %
Eosinophils Absolute: 0.1 10*3/uL (ref 0.0–0.5)
Eosinophils Relative: 1 %
HCT: 39.4 % (ref 39.0–52.0)
Hemoglobin: 13 g/dL (ref 13.0–17.0)
Immature Granulocytes: 0 %
Lymphocytes Relative: 7 %
Lymphs Abs: 0.7 10*3/uL (ref 0.7–4.0)
MCH: 29.6 pg (ref 26.0–34.0)
MCHC: 33 g/dL (ref 30.0–36.0)
MCV: 89.7 fL (ref 80.0–100.0)
Monocytes Absolute: 1 10*3/uL (ref 0.1–1.0)
Monocytes Relative: 10 %
Neutro Abs: 8.1 10*3/uL — ABNORMAL HIGH (ref 1.7–7.7)
Neutrophils Relative %: 82 %
Platelets: 193 10*3/uL (ref 150–400)
RBC: 4.39 MIL/uL (ref 4.22–5.81)
RDW: 13.8 % (ref 11.5–15.5)
WBC: 9.9 10*3/uL (ref 4.0–10.5)
nRBC: 0 % (ref 0.0–0.2)

## 2020-07-13 MED ORDER — MUPIROCIN CALCIUM 2 % EX CREA: TOPICAL_CREAM | CUTANEOUS | 0 refills | Status: DC

## 2020-07-13 MED ORDER — CEPHALEXIN 500 MG PO CAPS
500.0000 mg | ORAL_CAPSULE | Freq: Once | ORAL | Status: AC
  Administered 2020-07-13: 13:00:00 500 mg via ORAL
  Filled 2020-07-13: qty 1

## 2020-07-13 MED ORDER — PROBIOTIC 250 MG PO CAPS: 1.0000 | ORAL_CAPSULE | Freq: Two times a day (BID) | ORAL | 0 refills | Status: DC

## 2020-07-13 MED ORDER — CEPHALEXIN 500 MG PO CAPS: 500.0000 mg | ORAL_CAPSULE | Freq: Three times a day (TID) | ORAL | 0 refills | Status: AC

## 2020-07-13 NOTE — ED Notes (Addendum)
Called report to [place] at [facility]. "someone has already called [place]." No questions.

## 2020-07-13 NOTE — ED Notes (Signed)
Pt combative refusing a gown. Pt covered in blankets. In chair.

## 2020-07-13 NOTE — ED Provider Notes (Signed)
[facility] Emergency Department Provider Note    Event Date/Time   First MD Initiated Contact with Patient 07/13/20 1154     (approximate)  I have reviewed the triage vital signs and the nursing notes.   HISTORY  Chief Complaint Leg Pain  Level v caveat:  dementia  HPI Eric [name] is a 74 y.o. male extensive history as listed below with chronic cellulitis presents to the ER for evaluation of his right leg.  Reportedly was having right leg pain but denies any leg pain at this time now stating he just feels nauseated.  Has long history of cellulitis.  Right leg is wrapped.  Denies any worsening redness or swelling.    Past Medical History:  Diagnosis Date  . Acute respiratory failure with hypoxia (HCC)    multiple prior admissions to [facility] and [facility] for CHF exacerbation / respiratory failure  . Anxiety and depression   . Atrial fibrillation (HCC) 07/25/2018   new-onset during [facility] admission in Jan 2020  . Cataract   . CHF (congestive heart failure) (HCC)    EF <= 50% as of Jan 2020  . Current use of long term anticoagulation 07/2018   Started on apixaban for new-onset a-fib in Jan 2020  . Dementia (HCC)    possible, likely age-related, documented decreased cognitive function on prior hospitalizations  . Gallstones   . Glaucoma   . Hypertension   . Hypoglycemia   . Hypothyroidism   . Inguinal hernia   . Sleep apnea   . Thyroid disease    Family History  Problem Relation Age of Onset  . Alzheimer's disease Mother    Past Surgical History:  Procedure Laterality Date  . FINGER AMPUTATION Right    Patient Active Problem List   Diagnosis Date Noted  . Bilateral lower leg cellulitis 01/12/2020  . Venous stasis ulcer of both lower extremities without varicose veins (HCC) 01/12/2020  . Chronic respiratory failure with hypoxia (HCC) 01/12/2020  . Cellulitis of right lower extremity   . Severe protein-calorie malnutrition (HCC)   . Right foot  ulcer (HCC) 11/16/2019  . Anxiety and depression   . Cellulitis of right foot   . 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 Chronic combined systolic and diastolic CHF (congestive heart failure) (HCC) 11/16/2018  . Anasarca 11/16/2018  . Hypothyroidism 11/16/2018  . PAF (paroxysmal atrial fibrillation) (HCC) 11/16/2018  . HTN (hypertension) 11/16/2018  . Generalized anxiety disorder 11/03/2018  . Major depression in partial remission (HCC) 11/03/2018  . Acute hypoxemic respiratory failure (HCC) 10/29/2018  . Suspected COVID-19 virus infection 10/29/2018      Prior to Admission medications   Medication Sig Start Date End Date Taking? Authorizing Provider  acetaminophen (TYLENOL) 325 MG tablet Take 2 tablets (650 mg total) by mouth every 6 (six) hours as needed for mild pain (or Fever >/= 101). Patient taking differently: Take 650 mg by mouth every 6 (six) hours as needed for mild pain or headache (fever >/= 101, or minor discomfort).  11/03/18   Alford Highland, MD  albuterol (VENTOLIN HFA) 108 (90 Base) MCG/ACT inhaler Inhale 2 puffs into the lungs every 6 (six) hours as needed for wheezing or shortness of breath. Patient taking differently: Inhale 1 puff into the lungs every 6 (six) hours as needed for wheezing or shortness of breath.  11/21/19   Ghimire, Werner Lean, MD  alum & mag hydroxide-simeth (ANTACID) 200-200-20 MG/5ML suspension Take 30 mLs  by mouth 4 (four) times daily as needed for indigestion or heartburn.    [provider]  ascorbic acid (VITAMIN C) 500 MG tablet Take 500 mg by mouth 2 (two) times daily. Patient not taking: Reported on 06/08/2020    [provider]  cephALEXin (KEFLEX) 500 MG capsule Take 1 capsule (500 mg total) by mouth 3 (three) times daily for 7 days. 07/13/20 07/20/20  Willy Eddy, MD  collagenase (SANTYL) ointment Apply topically daily. Apply to right heel ulcer plus dry dressing change daily  11/22/19   Ghimire, Werner Lean, MD  diazepam (VALIUM) 2 MG tablet Take 0.5-1 tablets (1-2 mg total) by mouth See admin instructions. Take 1 mg by mouth in the morning and 2 mg at bedtime 11/21/19   Ghimire, Werner Lean, MD  diphenhydrAMINE (DIPHENHIST) 25 MG tablet Take 25 mg by mouth every 6 (six) hours as needed (for allergic reactions).    [provider]  furosemide (LASIX) 20 MG tablet Take 2 tablets (40 mg total) by mouth daily. 11/21/19   Ghimire, Werner Lean, MD  guaiFENesin (ROBITUSSIN) 100 MG/5ML liquid Take 300 mg by mouth every 6 (six) hours as needed for cough.    [provider]  levothyroxine (SYNTHROID) 200 MCG tablet Take 1 tablet (200 mcg total) by mouth daily before breakfast. 11/21/19   Ghimire, Werner Lean, MD  lidocaine (XYLOCAINE) 1 % (with preservative) injection by Other route once. Used to mix with rocephin    [provider]  liver oil-zinc oxide (DESITIN) 40 % ointment Apply topically daily. Patient taking differently: Apply 1 application topically See admin instructions. Apply to affected area once daily 11/04/18   Alford Highland, MD  loperamide (IMODIUM A-D) 2 MG tablet Take 4 mg by mouth every 3 (three) hours as needed for diarrhea or loose stools (CANNOT EXCEED 8 DOSES/24 HOURS).    [provider]  magnesium hydroxide (MILK OF MAGNESIA) 400 MG/5ML suspension Take 30 mLs by mouth daily as needed for mild constipation.    [provider]  metoCLOPramide (REGLAN) 5 MG tablet Take 5 mg by mouth every 6 (six) hours as needed for nausea or vomiting.    [provider]  Multiple Vitamins-Minerals (THERATRUM COMPLETE) TABS Take 1 tablet by mouth daily with breakfast.    [provider]  mupirocin ointment (BACTROBAN) 2 % Place 1 application into the nose 2 (two) times daily. 06/10/20   Arnetha Courser, MD  nystatin cream (MYCOSTATIN) Apply 1 application topically 2 (two) times a week.    [provider]  OLANZapine zydis  (ZYPREXA) 10 MG disintegrating tablet Take 1 tablet (10 mg total) by mouth 2 (two) times daily. Patient taking differently: Take 15 mg by mouth at bedtime.  11/03/18   Alford Highland, MD  OXYGEN Inhale 2 L/min into the lungs See admin instructions. "2 L/min, as tolerated"    [provider]  Saccharomyces boulardii (PROBIOTIC) 250 MG CAPS Take 1 capsule by mouth in the morning and at bedtime. 07/13/20   Willy Eddy, MD  sertraline (ZOLOFT) 25 MG tablet Take 1 tablet (25 mg total) by mouth daily. 06/10/19   Charm Rings, NP  Skin Protectants, Misc. (EUCERIN) cream Apply 1 application topically See admin instructions. Apply to both legs 2 times a day Patient not taking: Reported on 06/08/2020    [provider]    Allergies Demerol [meperidine], Epinephrine, Nardil [phenelzine], Morphine and related, Ondansetron, and Shellfish allergy    Social History Social History  Tobacco Use  . Smoking status: Former Smoker    Quit date: 10/18/2010    Years since quitting: 9.7  . Smokeless tobacco: Never Used  Substance Use Topics  . Alcohol use: No  . Drug use: No    Review of Systems Patient denies headaches, rhinorrhea, blurry vision, numbness, shortness of breath, chest pain, edema, cough, abdominal pain, nausea, vomiting, diarrhea, dysuria, fevers, rashes or hallucinations unless otherwise stated above in HPI. ____________________________________________   PHYSICAL EXAM:  VITAL SIGNS: Vitals:   07/13/20 1152 07/13/20 1156  BP:  126/75  Pulse:  88  Resp:  18  Temp:  98.2 F (36.8 C)  SpO2: 97% 96%    Constitutional: Alert, calm and cooperative  Eyes: Conjunctivae are normal.  Head: Atraumatic. Nose: No congestion/rhinnorhea. Mouth/Throat: Mucous membranes are moist.   Neck: No stridor. Painless ROM.  Cardiovascular: Normal rate, regular rhythm. Grossly normal heart sounds.  Good peripheral circulation. Respiratory: Normal respiratory effort.  No  retractions. Lungs CTAB. Gastrointestinal: Soft and nontender. No distention. No abdominal bruits. No CVA tenderness. Genitourinary: deferred Musculoskeletal: Bilateral lower extremity swelling and chronic edema.  Right lower extremity does have slight erythema as compared to left with evidence of what appear to be chronic venous stasis ulcerations.  Does had some no joint effusions. Neurologic:  Normal speech and language. No gross focal neurologic deficits are appreciated. No facial droop Skin:  Skin is warm, dry and intact. No rash noted. Psychiatric: calm and cooperative  ____________________________________________   LABS (all labs ordered are listed, but only abnormal results are displayed)  Results for orders placed or performed during the hospital encounter of 07/13/20 (from the past 24 hour(s))  Comprehensive metabolic panel     Status: Abnormal   Collection Time: 07/13/20 11:37 AM  Result Value Ref Range   Sodium 137 135 - 145 mmol/L   Potassium 4.1 3.5 - 5.1 mmol/L   Chloride 102 98 - 111 mmol/L   CO2 29 22 - 32 mmol/L   Glucose, Bld 91 70 - 99 mg/dL   BUN 16 8 - 23 mg/dL   Creatinine, Ser 1.61 0.61 - 1.24 mg/dL   Calcium 8.2 (L) 8.9 - 10.3 mg/dL   Total Protein 6.7 6.5 - 8.1 g/dL   Albumin 3.3 (L) 3.5 - 5.0 g/dL   AST 13 (L) 15 - 41 U/L   ALT 10 0 - 44 U/L   Alkaline Phosphatase 64 38 - 126 U/L   Total Bilirubin 1.3 (H) 0.3 - 1.2 mg/dL   GFR, Estimated >09 >60 mL/min   Anion gap 6 5 - 15  CBC with Differential     Status: Abnormal   Collection Time: 07/13/20 11:37 AM  Result Value Ref Range   WBC 9.9 4.0 - 10.5 K/uL   RBC 4.39 4.22 - 5.81 MIL/uL   Hemoglobin 13.0 13.0 - 17.0 g/dL   HCT 45.4 09.8 - 11.9 %   MCV 89.7 80.0 - 100.0 fL   MCH 29.6 26.0 - 34.0 pg   MCHC 33.0 30.0 - 36.0 g/dL   RDW 14.7 82.9 - 56.2 %   Platelets 193 150 - 400 K/uL   nRBC 0.0 0.0 - 0.2 %   Neutrophils Relative % 82 %   Neutro Abs 8.1 (H) 1.7 - 7.7 K/uL   Lymphocytes Relative 7 %    Lymphs Abs 0.7 0.7 - 4.0 K/uL   Monocytes Relative 10 %   Monocytes Absolute 1.0 0.1 - 1.0 K/uL   Eosinophils Relative 1 %  Eosinophils Absolute 0.1 0.0 - 0.5 K/uL   Basophils Relative 0 %   Basophils Absolute 0.0 0.0 - 0.1 K/uL   Immature Granulocytes 0 %   Abs Immature Granulocytes 0.04 0.00 - 0.07 K/uL   ____________________________________________ RADIOLOGY  I personally reviewed all radiographic images ordered to evaluate for the above acute complaints and reviewed radiology reports and findings.  These findings were personally discussed with the patient.  Please see medical record for radiology report.  ____________________________________________   PROCEDURES  Procedure(s) performed:  Procedures    Critical Care performed: no ____________________________________________   INITIAL IMPRESSION / ASSESSMENT AND PLAN / ED COURSE  Pertinent labs & imaging results that were available during my care of the patient were reviewed by me and considered in my medical decision making (see chart for details).   DDX: Cellulitis, chronic venous stasis, lymphedema  Vyron Fronczak [name] is a 74 y.o. who presents to the ED with report of right leg pain.  Does have extensive history of recurrent cellulitis of his leg with chronic venous stasis.  He has no sirs criteria.  Wound does have evidence of chronic ulceration certainly is at high risk for infection.  His abdominal exam is soft and benign.  Will order blood work to further restratify make sure he does not have any significant leukocytosis but anticipate that was patient will be appropriate for treatment with a trial of oral antibiotics as well as referral to wound clinic.  His wounds were redressed.  Does not seem consistent with ischemic limb phenomenon.  No signs of neck Fash.  No crepitus.  Laboratory evaluation will be sent to evaluate for the above complaints.     Clinical Course as of 07/13/20 1256  Sun Jul 13, 2020  1251 Patient  remains nontoxic-appearing.  Given the extent of his chronic venous stasis ulcerations with overlying erythema will put on a course of antibiotics I think is appropriate for outpatient follow-up with referral to wound care.  Does have palpable DP and PT pulses.  Cap refill less than 3 seconds.  Patient tolerating antibiotic.   [PR]    Clinical Course User Index [PR] Willy Eddy, MD    The patient was evaluated in Emergency Department today for the symptoms described in the history of present illness. He/she was evaluated in the context of the global COVID-19 pandemic, which necessitated consideration that the patient might be at risk for infection with the SARS-CoV-2 virus that causes COVID-19. Institutional protocols and algorithms that pertain to the evaluation of patients at risk for COVID-19 are in a state of rapid change based on information released by regulatory bodies including the CDC and federal and state organizations. These policies and algorithms were followed during the patient's care in the ED.  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   SACCHAROMYCES BOULARDII (PROBIOTIC) 250 MG CAPS    Take 1 capsule by mouth in the morning and at bedtime.     Note:  This document  was prepared using Conservation officer, historic buildingsDragon voice recognition software and may include unintentional dictation errors.    Willy Eddyobinson, Onis Markoff, MD 07/13/20 1256

## 2020-07-13 NOTE — ED Notes (Signed)
Pt refused VS and care at this time.

## 2020-07-13 NOTE — ED Notes (Signed)
Patient unable to sign for self for discharge due to hx dementia.

## 2020-07-13 NOTE — ED Notes (Signed)
Pt refusing dc vs. Pt taken to lobby per verbal request.

## 2020-07-13 NOTE — Discharge Instructions (Addendum)
Take your medications as prescribed.  Follow up with PCP and wound care clinic.  Return for worsening pain, fever.  Please follow up with PCP, wound clinic or return to ER in 48 hrs for wound check

## 2020-07-13 NOTE — ED Notes (Signed)
Right leg weeping, red, and swollen. Complains of pain x3 days. Dressing changed by this RN per Roxan Hockey, MD.

## 2020-07-13 NOTE — ED Notes (Signed)
Unable to get a hold of facility to give report after multiple attempts. Report was given to legal guardian and she is aware of discharge.

## 2020-07-13 NOTE — ED Notes (Signed)
Pt found attempting to get out of bed. Pt combative and yelling to be put back in their chair. Pt assisted x3 to wheel chair.
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
## 2020-07-13 NOTE — ED Notes (Signed)
Spoke to [place] [facility] regarding discharging patient back to facility.
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
## 2020-07-13 NOTE — ED Notes (Addendum)
Pt assisted in changing their brief and cleaning. Pt buttock and bilat post thighs reddened, open and witnesses multiple contact derm sites. Pt cleaned and placed in dry brief. Pt placed in stretcher as they were losing balance. Pt yelling "I want to get back in my chair"

## 2020-07-13 NOTE — ED Triage Notes (Signed)
Patient arrives via EMS from [facility] for leg pain. Patient has chronic cellulitis of the right leg. Patient has been noncompliant with medications per staff.
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
## 2020-07-21 ENCOUNTER — Encounter: Payer: Self-pay | Admitting: Radiology

## 2020-07-21 ENCOUNTER — Emergency Department: Payer: Medicare Other

## 2020-07-21 ENCOUNTER — Other Ambulatory Visit: Payer: Self-pay

## 2020-07-21 ENCOUNTER — Emergency Department
Admission: EM | Admit: 2020-07-21 | Discharge: 2020-07-21 | Disposition: A | Discharge status: Home / Self Care | Payer: Medicare Other | Attending: Emergency Medicine | Admitting: Emergency Medicine

## 2020-07-21 DIAGNOSIS — Z87891 Personal history of nicotine dependence: Secondary | ICD-10-CM | POA: Diagnosis not present

## 2020-07-21 DIAGNOSIS — M25561 Pain in right knee: Secondary | ICD-10-CM | POA: Diagnosis not present

## 2020-07-21 DIAGNOSIS — E039 Hypothyroidism, unspecified: Secondary | ICD-10-CM | POA: Diagnosis not present

## 2020-07-21 DIAGNOSIS — I110 Hypertensive heart disease with heart failure: Secondary | ICD-10-CM | POA: Insufficient documentation

## 2020-07-21 DIAGNOSIS — S51012A Laceration without foreign body of left elbow, initial encounter: Secondary | ICD-10-CM | POA: Insufficient documentation

## 2020-07-21 DIAGNOSIS — Z79899 Other long term (current) drug therapy: Secondary | ICD-10-CM | POA: Diagnosis not present

## 2020-07-21 DIAGNOSIS — W01198A Fall on same level from slipping, tripping and stumbling with subsequent striking against other object, initial encounter: Secondary | ICD-10-CM | POA: Diagnosis not present

## 2020-07-21 DIAGNOSIS — F0390 Unspecified dementia without behavioral disturbance: Secondary | ICD-10-CM | POA: Insufficient documentation

## 2020-07-21 DIAGNOSIS — I5042 Chronic combined systolic (congestive) and diastolic (congestive) heart failure: Secondary | ICD-10-CM | POA: Diagnosis not present

## 2020-07-21 DIAGNOSIS — S0101XA Laceration without foreign body of scalp, initial encounter: Secondary | ICD-10-CM | POA: Insufficient documentation

## 2020-07-21 DIAGNOSIS — W19XXXA Unspecified fall, initial encounter: Secondary | ICD-10-CM

## 2020-07-21 DIAGNOSIS — S0990XA Unspecified injury of head, initial encounter: Secondary | ICD-10-CM | POA: Diagnosis present
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
## 2020-07-21 NOTE — ED Notes (Signed)
[facility] [place] contacted in regards to pt discharge.

## 2020-07-21 NOTE — ED Provider Notes (Signed)
[facility] Emergency Department Provider Note   ____________________________________________   Event Date/Time   First MD Initiated Contact with Patient 07/21/20 [phone]     (approximate)  I have reviewed the triage vital signs and the nursing notes.   HISTORY  Chief Complaint Fall    HPI Eric [name] is a 75 y.o. male brought to the ED via EMS from the [place] status post unwitnessed fall with scalp laceration.  Patient with a history of frequent falls, refuses SNF placement, chronic RLE cellulitis,  not on anticoagulation found status post unwitnessed fall.  Patient denies LOC.  Complains of right knee pain.  Denies vision changes, neck pain, headache, chest pain, shortness of breath, abdominal pain, nausea, vomiting or dizziness.   Past Medical History:  Diagnosis Date  . Acute respiratory failure with hypoxia (HCC)    multiple prior admissions to [facility] and [facility] for CHF exacerbation / respiratory failure  . Anxiety and depression   . Atrial fibrillation (HCC) 07/25/2018   new-onset during [facility] admission in Jan 2020  . Cataract   . CHF (congestive heart failure) (HCC)    EF <= 50% as of Jan 2020  . Current use of long term anticoagulation 07/2018   Started on apixaban for new-onset a-fib in Jan 2020  . Dementia (HCC)    possible, likely age-related, documented decreased cognitive function on prior hospitalizations  . Gallstones   . Glaucoma   . Hypertension   . Hypoglycemia   . Hypothyroidism   . Inguinal hernia   . Sleep apnea   . Thyroid disease     Patient Active Problem List   Diagnosis Date Noted  . Bilateral lower leg cellulitis 01/12/2020  . Venous stasis ulcer of both lower extremities without varicose veins (HCC) 01/12/2020  . Chronic respiratory failure with hypoxia (HCC) 01/12/2020  . Cellulitis of right lower extremity   . Severe protein-calorie malnutrition (HCC)   . Right foot ulcer (HCC) 11/16/2019  . Anxiety and  depression   . Cellulitis of right foot   . 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 Chronic combined systolic and diastolic CHF (congestive heart failure) (HCC) 11/16/2018  . Anasarca 11/16/2018  . Hypothyroidism 11/16/2018  . PAF (paroxysmal atrial fibrillation) (HCC) 11/16/2018  . HTN (hypertension) 11/16/2018  . Generalized anxiety disorder 11/03/2018  . Major depression in partial remission (HCC) 11/03/2018  . Acute hypoxemic respiratory failure (HCC) 10/29/2018  . Suspected COVID-19 virus infection 10/29/2018    Past Surgical History:  Procedure Laterality Date  . FINGER AMPUTATION Right     Prior to Admission medications   Medication Sig Start Date End Date Taking? Authorizing Provider  acetaminophen (TYLENOL) 325 MG tablet Take 2 tablets (650 mg total) by mouth every 6 (six) hours as needed for mild pain (or Fever >/= 101). Patient taking differently: Take 650 mg by mouth every 6 (six) hours as needed for mild pain or headache (fever >/= 101, or minor discomfort).  11/03/18   Alford Highland, MD  albuterol (VENTOLIN HFA) 108 (90 Base) MCG/ACT inhaler Inhale 2 puffs into the lungs every 6 (six) hours as needed for wheezing or shortness of breath. Patient taking differently: Inhale 1 puff into the lungs every 6 (six) hours as needed for wheezing or shortness of breath.  11/21/19   Ghimire, Werner Lean, MD  alum & mag hydroxide-simeth (ANTACID) 200-200-20 MG/5ML suspension Take 30 mLs by mouth 4 (four) times daily as needed for indigestion  or heartburn.    [provider]  ascorbic acid (VITAMIN C) 500 MG tablet Take 500 mg by mouth 2 (two) times daily. Patient not taking: Reported on 06/08/2020    [provider]  collagenase (SANTYL) ointment Apply topically daily. Apply to right heel ulcer plus dry dressing change daily 11/22/19   Ghimire, Werner Lean, MD  diazepam (VALIUM) 2 MG tablet Take 0.5-1 tablets (1-2 mg total) by mouth  See admin instructions. Take 1 mg by mouth in the morning and 2 mg at bedtime 11/21/19   Ghimire, Werner Lean, MD  diphenhydrAMINE (DIPHENHIST) 25 MG tablet Take 25 mg by mouth every 6 (six) hours as needed (for allergic reactions).    [provider]  furosemide (LASIX) 20 MG tablet Take 2 tablets (40 mg total) by mouth daily. 11/21/19   Ghimire, Werner Lean, MD  guaiFENesin (ROBITUSSIN) 100 MG/5ML liquid Take 300 mg by mouth every 6 (six) hours as needed for cough.    [provider]  levothyroxine (SYNTHROID) 200 MCG tablet Take 1 tablet (200 mcg total) by mouth daily before breakfast. 11/21/19   Ghimire, Werner Lean, MD  lidocaine (XYLOCAINE) 1 % (with preservative) injection by Other route once. Used to mix with rocephin    [provider]  liver oil-zinc oxide (DESITIN) 40 % ointment Apply topically daily. Patient taking differently: Apply 1 application topically See admin instructions. Apply to affected area once daily 11/04/18   Alford Highland, MD  loperamide (IMODIUM A-D) 2 MG tablet Take 4 mg by mouth every 3 (three) hours as needed for diarrhea or loose stools (CANNOT EXCEED 8 DOSES/24 HOURS).    [provider]  magnesium hydroxide (MILK OF MAGNESIA) 400 MG/5ML suspension Take 30 mLs by mouth daily as needed for mild constipation.    [provider]  metoCLOPramide (REGLAN) 5 MG tablet Take 5 mg by mouth every 6 (six) hours as needed for nausea or vomiting.    [provider]  Multiple Vitamins-Minerals (THERATRUM COMPLETE) TABS Take 1 tablet by mouth daily with breakfast.    [provider]  mupirocin cream (BACTROBAN) 2 % Apply to affected area twice daily 07/13/20 07/13/21  Willy Eddy, MD  mupirocin ointment (BACTROBAN) 2 % Place 1 application into the nose 2 (two) times daily. 06/10/20   Arnetha Courser, MD  nystatin cream (MYCOSTATIN) Apply 1 application topically 2 (two) times a week.    [provider]  OLANZapine  zydis (ZYPREXA) 10 MG disintegrating tablet Take 1 tablet (10 mg total) by mouth 2 (two) times daily. Patient taking differently: Take 15 mg by mouth at bedtime.  11/03/18   Alford Highland, MD  OXYGEN Inhale 2 L/min into the lungs See admin instructions. "2 L/min, as tolerated"    [provider]  Saccharomyces boulardii (PROBIOTIC) 250 MG CAPS Take 1 capsule by mouth in the morning and at bedtime. 07/13/20   Willy Eddy, MD  sertraline (ZOLOFT) 25 MG tablet Take 1 tablet (25 mg total) by mouth daily. 06/10/19   Charm Rings, NP  Skin Protectants, Misc. (EUCERIN) cream Apply 1 application topically See admin instructions. Apply to both legs 2 times a day Patient not taking: Reported on 06/08/2020    [provider]    Allergies Demerol [meperidine], Epinephrine, Nardil [phenelzine], Morphine and related, Ondansetron, and Shellfish allergy  Family History  Problem Relation Age of Onset  . Alzheimer's disease Mother     Social History Social History   Tobacco Use  .  Smoking status: Former Smoker    Quit date: 10/18/2010    Years since quitting: 9.7  . Smokeless tobacco: Never Used  Substance Use Topics  . Alcohol use: No  . Drug use: No    Review of Systems  Constitutional: Positive for fall.  No fever/chills Eyes: No visual changes. ENT: No sore throat. Cardiovascular: Denies chest pain. Respiratory: Denies shortness of breath. Gastrointestinal: No abdominal pain.  No nausea, no vomiting.  No diarrhea.  No constipation. Genitourinary: Negative for dysuria. Musculoskeletal: Positive for right knee pain.  Negative for back pain. Skin: Negative for rash. Neurological: Positive for minor head injury.  Negative for headaches, focal weakness or numbness.   ____________________________________________   PHYSICAL EXAM:  VITAL SIGNS: ED Triage Vitals  Enc Vitals Group     BP      Pulse      Resp      Temp      Temp src      SpO2      Weight       Height      Head Circumference      Peak Flow      Pain Score      Pain Loc      Pain Edu?      Excl. in GC?     Constitutional: Alert and oriented.  Elderly appearing and in no acute distress. Eyes: Conjunctivae are normal. PERRL. EOMI. Head: Skin tear type abrasion to vertex of scalp not amenable to suturing or stapling. Nose: Atraumatic. Mouth/Throat: Mucous membranes are mildly dry.  No dental malocclusion.   Neck: No stridor.  No cervical spine tenderness to palpation. Cardiovascular: Normal rate, regular rhythm. Grossly normal heart sounds.  Good peripheral circulation. Respiratory: Normal respiratory effort.  No retractions. Lungs CTAB. Gastrointestinal: Soft and nontender. No distention. No abdominal bruits. No CVA tenderness. Musculoskeletal: No spinal tenderness to palpation.  Pelvis is stable.  Full range of hips bilaterally.  Right knee with mild to moderate swelling. Limited range of motion secondary to pain.  RLE cellulitic.  Palpable distal pulse.  Brisk, less than 5-second capillary refill.  No lower extremity tenderness nor edema.  Left forearm skin tear.   Neurologic:  Normal speech and language. No gross focal neurologic deficits are appreciated.  Skin:  Skin is warm, dry and intact. No rash noted. Psychiatric: Mood and affect are normal. Speech and behavior are normal.  ____________________________________________   LABS (all labs ordered are listed, but only abnormal results are displayed)  Labs Reviewed - No data to display ____________________________________________  EKG  None ____________________________________________  RADIOLOGY I, Tiant Peixoto J, personally viewed and evaluated these images (plain radiographs) as part of my medical decision making, as well as reviewing the written report by the radiologist.  ED MD interpretation: No ICH, no acute cardiopulmonary process, no acute traumatic injury to right knee  Official radiology report(s): DG  Chest 1 View  Result Date: 07/21/2020 CLINICAL DATA:  Unwitnessed fall with head injury EXAM: CHEST  1 VIEW COMPARISON:  07/13/2020 FINDINGS: Interstitial crowding at the lung bases where there is chronic reticular density reflecting scarring seen on 06/04/2020 chest CT. No acute opacity. Borderline heart size that is stable. Severe right shoulder osteoarthritis. IMPRESSION: No acute finding when compared to priors. Electronically Signed   By: Marnee Spring M.D.   On: 07/21/2020 05:30   CT Head Wo Contrast  Result Date: 07/21/2020 CLINICAL DATA:  Unwitnessed fall with head injury EXAM: CT HEAD WITHOUT CONTRAST TECHNIQUE:  Contiguous axial images were obtained from the base of the skull through the vertex without intravenous contrast. COMPARISON:  04/30/2020 FINDINGS: Brain: No evidence of acute infarction, hemorrhage, hydrocephalus, extra-axial collection or mass lesion/mass effect. Prominent generalized atrophy. Low-density at the left basal ganglia which is from remote insult given stability from 04/30/2020. Chronic lacune at the right caudate head. Vascular: No hyperdense vessel or unexpected calcification. Skull: Normal. Negative for fracture or focal lesion. Sinuses/Orbits: No evidence of injury IMPRESSION: 1. No acute finding or change from October 2021. 2. Generalized brain atrophy. Electronically Signed   By: Monte Fantasia M.D.   On: 07/21/2020 06:01   DG Knee Complete 4 Views Right  Result Date: 07/21/2020 CLINICAL DATA:  Unwitnessed fall.  Knee pain. EXAM: RIGHT KNEE - COMPLETE 4+ VIEW COMPARISON:  None. FINDINGS: No fracture. No subluxation or dislocation. Complete loss of joint space noted in the medial compartment. Advanced hypertrophic spurring evident in all 3 compartments. Small joint effusion associated. IMPRESSION: 1. No acute bony abnormality. 2. Advanced tricompartmental degenerative changes with small joint effusion. Electronically Signed   By: Misty Stanley M.D.   On: 07/21/2020 05:09     ____________________________________________   PROCEDURES  Procedure(s) performed (including Critical Care):  Procedures   ____________________________________________   INITIAL IMPRESSION / ASSESSMENT AND PLAN / ED COURSE  As part of my medical decision making, I reviewed the following data within the [place] notes reviewed and incorporated, EKG interpreted, Old chart reviewed, Radiograph reviewed and Notes from prior ED visits     75 year old male who presents status post unwitnessed fall with skin tear to vertex of scalp and left forearm, complaining of right knee pain.  Will obtain CT head, right knee x-ray and reassess.  Clinical Course as of 07/21/20 0613  Mon Jul 21, 2020  0609 Patient resting in no acute distress.  Updated him on all test results.  Noted sats 89% on prior ED visit.  No complaints of chest pain or shortness of breath.  Will bandage head and left forearm with nonadhesive dressing, Ace wrap to right knee.  Strict return precautions given.  Patient verbalizes understanding and agrees with plan of care [JS]    Clinical Course User Index [JS] Paulette Blanch, MD     ____________________________________________   FINAL CLINICAL IMPRESSION(S) / ED DIAGNOSES  Final diagnoses:  Fall, initial encounter  Laceration of scalp, initial encounter  Minor head injury, initial encounter  Skin tear of left elbow without complication, initial encounter  Acute pain of right knee     ED Discharge Orders    None      *Please note:  Eric [name] was evaluated in Emergency Department on 07/21/2020 for the symptoms described in the history of present illness. He was evaluated in the context of the global COVID-19 pandemic, which necessitated consideration that the patient might be at risk for infection with the SARS-CoV-2 virus that causes COVID-19. Institutional protocols and algorithms that pertain to the evaluation of patients at risk  for COVID-19 are in a state of rapid change based on information released by regulatory bodies including the CDC and federal and state organizations. These policies and algorithms were followed during the patient's care in the ED.  Some ED evaluations and interventions may be delayed as a result of limited staffing during and the pandemic.*   Note:  This document was prepared using Dragon voice recognition software and may include unintentional dictation errors.   Paulette Blanch, MD  07/21/20 0638  

## 2020-07-21 NOTE — ED Notes (Signed)
Ace wrap applied to right knee. Pt head wrap remains in place. Pt resting with no complaints. EMS called for transport for discharge.

## 2020-07-21 NOTE — Discharge Instructions (Signed)
You may wear Ace wrap as needed for comfort.  Return to the ER for worsening symptoms, persistent vomiting, difficulty breathing or other concerns.

## 2020-08-01 ENCOUNTER — Telehealth: Payer: Self-pay | Admitting: Family

## 2020-08-01 NOTE — Telephone Encounter (Signed)
LVM in attempt to reschedule a no show appointment    Mackenna Kamer, NT

## 2020-08-17 ENCOUNTER — Emergency Department: Payer: Medicare Other

## 2020-08-17 ENCOUNTER — Other Ambulatory Visit: Payer: Self-pay

## 2020-08-17 ENCOUNTER — Emergency Department
Admission: EM | Admit: 2020-08-17 | Discharge: 2020-08-17 | Disposition: A | Discharge status: Home / Self Care | Payer: Medicare Other | Attending: Emergency Medicine | Admitting: Emergency Medicine

## 2020-08-17 DIAGNOSIS — R479 Unspecified speech disturbances: Secondary | ICD-10-CM | POA: Diagnosis not present

## 2020-08-17 DIAGNOSIS — L03115 Cellulitis of right lower limb: Secondary | ICD-10-CM | POA: Diagnosis not present

## 2020-08-17 DIAGNOSIS — Z79899 Other long term (current) drug therapy: Secondary | ICD-10-CM | POA: Insufficient documentation

## 2020-08-17 DIAGNOSIS — R4701 Aphasia: Secondary | ICD-10-CM | POA: Insufficient documentation

## 2020-08-17 DIAGNOSIS — Z8616 Personal history of COVID-19: Secondary | ICD-10-CM | POA: Diagnosis not present

## 2020-08-17 DIAGNOSIS — E039 Hypothyroidism, unspecified: Secondary | ICD-10-CM | POA: Diagnosis not present

## 2020-08-17 DIAGNOSIS — F0390 Unspecified dementia without behavioral disturbance: Secondary | ICD-10-CM | POA: Insufficient documentation

## 2020-08-17 DIAGNOSIS — I110 Hypertensive heart disease with heart failure: Secondary | ICD-10-CM | POA: Diagnosis not present

## 2020-08-17 DIAGNOSIS — Z87891 Personal history of nicotine dependence: Secondary | ICD-10-CM | POA: Diagnosis not present

## 2020-08-17 DIAGNOSIS — M79604 Pain in right leg: Secondary | ICD-10-CM | POA: Diagnosis present

## 2020-08-17 DIAGNOSIS — I5042 Chronic combined systolic (congestive) and diastolic (congestive) heart failure: Secondary | ICD-10-CM | POA: Diagnosis not present

## 2020-08-17 DIAGNOSIS — L03116 Cellulitis of left lower limb: Secondary | ICD-10-CM | POA: Insufficient documentation

## 2020-08-17 DIAGNOSIS — L03119 Cellulitis of unspecified part of limb: Secondary | ICD-10-CM

## 2020-08-17 LAB — COMPREHENSIVE METABOLIC PANEL
ALT: 10 U/L (ref 0–44)
AST: 16 U/L (ref 15–41)
Albumin: 3.5 g/dL (ref 3.5–5.0)
Alkaline Phosphatase: 69 U/L (ref 38–126)
Anion gap: 10 (ref 5–15)
BUN: 22 mg/dL (ref 8–23)
CO2: 30 mmol/L (ref 22–32)
Calcium: 8.7 mg/dL — ABNORMAL LOW (ref 8.9–10.3)
Chloride: 102 mmol/L (ref 98–111)
Creatinine, Ser: 1.03 mg/dL (ref 0.61–1.24)
GFR, Estimated: >60 mL/min (ref >60)
Glucose, Bld: 109 mg/dL — ABNORMAL HIGH (ref 70–99)
Potassium: 4.1 mmol/L (ref 3.5–5.1)
Sodium: 142 mmol/L (ref 135–145)
Total Bilirubin: 0.9 mg/dL (ref 0.3–1.2)
Total Protein: 7.2 g/dL (ref 6.5–8.1)

## 2020-08-17 LAB — CBC WITH DIFFERENTIAL/PLATELET
Abs Immature Granulocytes: 0.05 10*3/uL (ref 0.00–0.07)
Basophils Absolute: 0 10*3/uL (ref 0.0–0.1)
Basophils Relative: 0 %
Eosinophils Absolute: 0.1 10*3/uL (ref 0.0–0.5)
Eosinophils Relative: 1 %
HCT: 43 % (ref 39.0–52.0)
Hemoglobin: 13.6 g/dL (ref 13.0–17.0)
Immature Granulocytes: 1 %
Lymphocytes Relative: 5 %
Lymphs Abs: 0.5 10*3/uL — ABNORMAL LOW (ref 0.7–4.0)
MCH: 29.2 pg (ref 26.0–34.0)
MCHC: 31.6 g/dL (ref 30.0–36.0)
MCV: 92.5 fL (ref 80.0–100.0)
Monocytes Absolute: 0.9 10*3/uL (ref 0.1–1.0)
Monocytes Relative: 8 %
Neutro Abs: 8.9 10*3/uL — ABNORMAL HIGH (ref 1.7–7.7)
Neutrophils Relative %: 85 %
Platelets: 201 10*3/uL (ref 150–400)
RBC: 4.65 MIL/uL (ref 4.22–5.81)
RDW: 13.4 % (ref 11.5–15.5)
WBC: 10.4 10*3/uL (ref 4.0–10.5)
nRBC: 0 % (ref 0.0–0.2)

## 2020-08-17 LAB — LACTIC ACID, PLASMA: Lactic Acid, Venous: 1.5 mmol/L (ref 0.5–1.9)

## 2020-08-17 LAB — URINALYSIS, COMPLETE (UACMP) WITH MICROSCOPIC
Bacteria, UA: NONE SEEN
Bilirubin Urine: NEGATIVE
Glucose, UA: NEGATIVE mg/dL
Hgb urine dipstick: NEGATIVE
Ketones, ur: NEGATIVE mg/dL
Leukocytes,Ua: NEGATIVE
Nitrite: NEGATIVE
Protein, ur: NEGATIVE mg/dL
Specific Gravity, Urine: 1.021 (ref 1.005–1.030)
Squamous Epithelial / HPF: NONE SEEN (ref 0–5)
pH: 6 (ref 5.0–8.0)

## 2020-08-17 LAB — TROPONIN I (HIGH SENSITIVITY)
Troponin I (High Sensitivity): 7 ng/L (ref <18)
Troponin I (High Sensitivity): 8 ng/L (ref <18)

## 2020-08-17 LAB — PROCALCITONIN: Procalcitonin: <0.1 ng/mL

## 2020-08-17 MED ORDER — CLINDAMYCIN HCL 300 MG PO CAPS: 300.0000 mg | ORAL_CAPSULE | Freq: Three times a day (TID) | ORAL | 0 refills | Status: AC

## 2020-08-17 MED ORDER — LACTATED RINGERS IV BOLUS
1000.0000 mL | Freq: Once | INTRAVENOUS | Status: AC
  Administered 2020-08-17: 1000 mL via INTRAVENOUS

## 2020-08-17 MED ORDER — CLINDAMYCIN PHOSPHATE 600 MG/50ML IV SOLN
600.0000 mg | Freq: Once | INTRAVENOUS | Status: AC
  Administered 2020-08-17: 600 mg via INTRAVENOUS
  Filled 2020-08-17: qty 50

## 2020-08-17 NOTE — ED Notes (Signed)
Spoke with Esperanza Heir at [employer]. Informed her pt will be discharged and sent back via EMS. Informed her that UA was negative and labs and imaging did not show any acute changes and that pt is being treated for lower extremity cellulitis.

## 2020-08-17 NOTE — ED Notes (Signed)
RN called listed legal gaurdian, Lady Gary to inform of patient discharge. Gaurdian did not answer so a message was left detailing discharge and with instructions to call the ED should she have questions upon receiving message.

## 2020-08-17 NOTE — ED Provider Notes (Signed)
[facility] Emergency Department Provider Note  ____________________________________________  Time seen: Approximately 3:13 AM  I have reviewed the triage vital signs and the nursing notes.   HISTORY  Chief Complaint Aphasia  Level 5 caveat:  Portions of the history and physical were unable to be obtained due to dementia   HPI Eric [name] is a 75 y.o. male with a history of dementia, CHF, A. Fib on Eliquis, hypertension, hypothyroidism who presents from his nursing home for aphasia. EMS was called because patient was having abnormal speech. Last seen normal 2 days ago. Upon arrival to the emergency room patient speech slightly slurred but according to him this is his normal speech. His only complaint is bilateral leg pain. Patient does have a history of chronic venous stasis. I spoke with the nursing home who reports that patient was confused, slumped over and not as responsive as normal. He looked very weak to them but no aphasia as reported per EMS.  Past Medical History:  Diagnosis Date  . Acute respiratory failure with hypoxia (HCC)    multiple prior admissions to [facility] and [facility] for CHF exacerbation / respiratory failure  . Anxiety and depression   . Atrial fibrillation (HCC) 07/25/2018   new-onset during [facility] admission in Jan 2020  . Cataract   . CHF (congestive heart failure) (HCC)    EF <= 50% as of Jan 2020  . Current use of long term anticoagulation 07/2018   Started on apixaban for new-onset a-fib in Jan 2020  . Dementia (HCC)    possible, likely age-related, documented decreased cognitive function on prior hospitalizations  . Gallstones   . Glaucoma   . Hypertension   . Hypoglycemia   . Hypothyroidism   . Inguinal hernia   . Sleep apnea   . Thyroid disease     Patient Active Problem List   Diagnosis Date Noted  . Bilateral lower leg cellulitis 01/12/2020  . Venous stasis ulcer of both lower extremities without varicose veins (HCC)  01/12/2020  . Chronic respiratory failure with hypoxia (HCC) 01/12/2020  . Cellulitis of right lower extremity   . Severe protein-calorie malnutrition (HCC)   . Right foot ulcer (HCC) 11/16/2019  . Anxiety and depression   . Cellulitis of right foot   . 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 Chronic combined systolic and diastolic CHF (congestive heart failure) (HCC) 11/16/2018  . Anasarca 11/16/2018  . Hypothyroidism 11/16/2018  . PAF (paroxysmal atrial fibrillation) (HCC) 11/16/2018  . HTN (hypertension) 11/16/2018  . Generalized anxiety disorder 11/03/2018  . Major depression in partial remission (HCC) 11/03/2018  . Acute hypoxemic respiratory failure (HCC) 10/29/2018  . Suspected COVID-19 virus infection 10/29/2018    Past Surgical History:  Procedure Laterality Date  . FINGER AMPUTATION Right     Prior to Admission medications   Medication Sig Start Date End Date Taking? Authorizing Provider  clindamycin (CLEOCIN) 300 MG capsule Take 1 capsule (300 mg total) by mouth 3 (three) times daily for 10 days. 08/17/20 08/27/20 Yes Don PerkingVeronese, [place], MD  acetaminophen (TYLENOL) 325 MG tablet Take 2 tablets (650 mg total) by mouth every 6 (six) hours as needed for mild pain (or Fever >/= 101). Patient taking differently: Take 650 mg by mouth every 6 (six) hours as needed for mild pain or headache (fever >/= 101, or minor discomfort).  11/03/18   Alford HighlandWieting, Richard, MD  albuterol (VENTOLIN HFA) 108 (90 Base) MCG/ACT inhaler Inhale 2 puffs  into the lungs every 6 (six) hours as needed for wheezing or shortness of breath. Patient taking differently: Inhale 1 puff into the lungs every 6 (six) hours as needed for wheezing or shortness of breath.  11/21/19   Ghimire, Werner Lean, MD  alum & mag hydroxide-simeth (ANTACID) 200-200-20 MG/5ML suspension Take 30 mLs by mouth 4 (four) times daily as needed for indigestion or heartburn.    [provider]   ascorbic acid (VITAMIN C) 500 MG tablet Take 500 mg by mouth 2 (two) times daily. Patient not taking: Reported on 06/08/2020    [provider]  collagenase (SANTYL) ointment Apply topically daily. Apply to right heel ulcer plus dry dressing change daily 11/22/19   Ghimire, Werner Lean, MD  diazepam (VALIUM) 2 MG tablet Take 0.5-1 tablets (1-2 mg total) by mouth See admin instructions. Take 1 mg by mouth in the morning and 2 mg at bedtime 11/21/19   Ghimire, Werner Lean, MD  diphenhydrAMINE (DIPHENHIST) 25 MG tablet Take 25 mg by mouth every 6 (six) hours as needed (for allergic reactions).    [provider]  furosemide (LASIX) 20 MG tablet Take 2 tablets (40 mg total) by mouth daily. 11/21/19   Ghimire, Werner Lean, MD  guaiFENesin (ROBITUSSIN) 100 MG/5ML liquid Take 300 mg by mouth every 6 (six) hours as needed for cough.    [provider]  levothyroxine (SYNTHROID) 200 MCG tablet Take 1 tablet (200 mcg total) by mouth daily before breakfast. 11/21/19   Ghimire, Werner Lean, MD  lidocaine (XYLOCAINE) 1 % (with preservative) injection by Other route once. Used to mix with rocephin    [provider]  liver oil-zinc oxide (DESITIN) 40 % ointment Apply topically daily. Patient taking differently: Apply 1 application topically See admin instructions. Apply to affected area once daily 11/04/18   Alford Highland, MD  loperamide (IMODIUM A-D) 2 MG tablet Take 4 mg by mouth every 3 (three) hours as needed for diarrhea or loose stools (CANNOT EXCEED 8 DOSES/24 HOURS).    [provider]  magnesium hydroxide (MILK OF MAGNESIA) 400 MG/5ML suspension Take 30 mLs by mouth daily as needed for mild constipation.    [provider]  metoCLOPramide (REGLAN) 5 MG tablet Take 5 mg by mouth every 6 (six) hours as needed for nausea or vomiting.    [provider]  Multiple Vitamins-Minerals (THERATRUM COMPLETE) TABS Take 1 tablet by mouth daily with breakfast.     [provider]  mupirocin cream (BACTROBAN) 2 % Apply to affected area twice daily 07/13/20 07/13/21  Willy Eddy, MD  mupirocin ointment (BACTROBAN) 2 % Place 1 application into the nose 2 (two) times daily. 06/10/20   Arnetha Courser, MD  nystatin cream (MYCOSTATIN) Apply 1 application topically 2 (two) times a week.    [provider]  OLANZapine zydis (ZYPREXA) 10 MG disintegrating tablet Take 1 tablet (10 mg total) by mouth 2 (two) times daily. Patient taking differently: Take 15 mg by mouth at bedtime.  11/03/18   Alford Highland, MD  OXYGEN Inhale 2 L/min into the lungs See admin instructions. "2 L/min, as tolerated"    [provider]  Saccharomyces boulardii (PROBIOTIC) 250 MG CAPS Take 1 capsule by mouth in the morning and at bedtime. 07/13/20   Willy Eddy, MD  sertraline (ZOLOFT) 25 MG tablet Take 1 tablet (25 mg total) by mouth daily. 06/10/19   Charm Rings, NP  Skin Protectants, Misc. (EUCERIN) cream Apply 1 application topically See  admin instructions. Apply to both legs 2 times a day Patient not taking: Reported on 06/08/2020    [provider]    Allergies Demerol [meperidine], Epinephrine, Nardil [phenelzine], Morphine and related, Ondansetron, and Shellfish allergy  Family History  Problem Relation Age of Onset  . Alzheimer's disease Mother     Social History Social History   Tobacco Use  . Smoking status: Former Smoker    Quit date: 10/18/2010    Years since quitting: 9.8  . Smokeless tobacco: Never Used  Substance Use Topics  . Alcohol use: No  . Drug use: No    Review of Systems  Constitutional: Negative for fever. Eyes: Negative for visual changes. ENT: Negative for sore throat. Neck: No neck pain  Cardiovascular: Negative for chest pain. Respiratory: Negative for shortness of breath. Gastrointestinal: Negative for abdominal pain, vomiting or diarrhea. Genitourinary: Negative for  dysuria. Musculoskeletal: Negative for back pain. + b/l leg pain Skin: Negative for rash. Neurological: Negative for headaches, weakness or numbness. Psych: No SI or HI  ____________________________________________   PHYSICAL EXAM:  VITAL SIGNS: ED Triage Vitals  Enc Vitals Group     BP 08/17/20 0216 130/87     Pulse Rate 08/17/20 0216 (!) 102     Resp 08/17/20 0216 (!) 22     Temp 08/17/20 0216 98.6 F (37 C)     Temp Source 08/17/20 0216 Oral     SpO2 08/17/20 0216 (!) 89 %     Weight 08/17/20 0221 240 lb 4.8 oz (109 kg)     Height 08/17/20 0221 5\' 7"  (1.702 m)     Head Circumference --      Peak Flow --      Pain Score 08/17/20 0221 6     Pain Loc --      Pain Edu? --      Excl. in GC? --     Constitutional: Alert and oriented x 2 in no distress.  HEENT:      Head: Normocephalic and atraumatic.         Eyes: Conjunctivae are normal. Sclera is non-icteric.       Mouth/Throat: Mucous membranes are moist.       Neck: Supple with no signs of meningismus. Cardiovascular: Tachycardic with regular rhythm. Respiratory: Normal respiratory effort. Lungs are clear to auscultation bilaterally.  Gastrointestinal: Soft, non tender, and non distended. Musculoskeletal: erythema and warmth of b/l LE L>R with purulent foul smelling drainage from the RLE Neurologic: Slurred speech. Face is symmetric. Moving all extremities. No gross focal neurologic deficits are appreciated. Skin: Skin is warm, dry and intact. No rash noted. Psychiatric: Mood and affect are normal. Speech and behavior are normal.  ____________________________________________   LABS (all labs ordered are listed, but only abnormal results are displayed)  Labs Reviewed  CBC WITH DIFFERENTIAL/PLATELET - Abnormal; Notable for the following components:      Result Value   Neutro Abs 8.9 (*)    Lymphs Abs 0.5 (*)    All other components within normal limits  COMPREHENSIVE METABOLIC PANEL - Abnormal; Notable for the  following components:   Glucose, Bld 109 (*)    Calcium 8.7 (*)    All other components within normal limits  URINALYSIS, COMPLETE (UACMP) WITH MICROSCOPIC - Abnormal; Notable for the following components:   Color, Urine YELLOW (*)    APPearance CLEAR (*)    All other components within normal limits  CULTURE, BLOOD (ROUTINE X 2)  CULTURE, BLOOD (ROUTINE X  2)  LACTIC ACID, PLASMA  PROCALCITONIN  TROPONIN I (HIGH SENSITIVITY)  TROPONIN I (HIGH SENSITIVITY)   ____________________________________________  EKG  ED ECG REPORT I, Nita Sickle, the attending physician, personally viewed and interpreted this ECG.  Sinus rhythm, rate of 97, normal intervals, no ST elevations or depressions. ____________________________________________  RADIOLOGY  I have personally reviewed the images performed during this visit and I agree with the Radiologist's read.   Interpretation by Radiologist:  CT Head Wo Contrast  Result Date: 08/17/2020 CLINICAL DATA:  changes in speech x 2 days. On arrival speech normal. Pt with bilateral leg swelling and redness. Right leg with drainage. EXAM: CT HEAD WITHOUT CONTRAST TECHNIQUE: Contiguous axial images were obtained from the base of the skull through the vertex without intravenous contrast. COMPARISON:  CT head 04/30/2020, CT head 07/21/2020 FINDINGS: Brain: Patchy and confluent areas of decreased attenuation are noted throughout the deep and periventricular white matter of the cerebral hemispheres bilaterally, compatible with chronic microvascular ischemic disease. Redemonstration of a chronic hypodensity within the left basal ganglia. Cerebral ventricle sizes are concordant with the degree of cerebral volume loss. no evidence of large-territorial acute infarction. No parenchymal hemorrhage. No mass lesion. No extra-axial collection. No mass effect or midline shift. No hydrocephalus. Basilar cisterns are patent. Vascular: No hyperdense vessel. Skull: No acute  fracture or focal lesion. Sinuses/Orbits: Paranasal sinuses and mastoid air cells are clear. The orbits are unremarkable. Other: None. IMPRESSION: No acute intracranial abnormality. Electronically Signed   By: Tish Frederickson M.D.   On: 08/17/2020 02:55     ____________________________________________   PROCEDURES  Procedure(s) performed:yes .1-3 Lead EKG Interpretation Performed by: Nita Sickle, MD Authorized by: Nita Sickle, MD     Interpretation: non-specific     ECG rate assessment: normal     Rhythm: sinus rhythm     Ectopy: none     Critical Care performed:  None ____________________________________________   INITIAL IMPRESSION / ASSESSMENT AND PLAN / ED COURSE   75 y.o. male with a history of dementia, CHF, A. Fib on Eliquis, hypertension, hypothyroidism who presents from his nursing home for altered mental status. Patient arrives with no aphasia, alert and oriented to self and place. Patient's complaining of b/l leg pain. He is slurred but this is his baseline. RLE was wrapped. Wrap removed with significant amount of purulent discharge. LLE has several excoriations with surrounding erythema and warmth concerning for cellulitis. No crepitus or bullae, no necrosis, no abscess. Patient slightly tachycardic but afebrile. Will get basic labs to rule out sepsis. No focal neuro deficits.  Old medical records reviewed as patient comes to this ED frequently.   _________________________ 5:33 AM on 08/17/2020 -----------------------------------------  No signs of sepsis with normal procalcitonin, normal white count, normal lactic acid.  UA with no signs of infection.  Head CT visualized by me with no acute findings, confirmed by radiology.  Blood cultures were sent.  Patient received a dose of IV clindamycin.  With no signs of sepsis patient is stable to discharge back to his nursing home on p.o. clindamycin.  Discussed wound care and close follow-up with PCP and also my  standard return precautions with the nursing home staff.       _____________________________________________ Please note:  Patient was evaluated in Emergency Department today for the symptoms described in the history of present illness. Patient was evaluated in the context of the global COVID-19 pandemic, which necessitated consideration that the patient might be at risk for infection with the SARS-CoV-2 virus  that causes COVID-19. Institutional protocols and algorithms that pertain to the evaluation of patients at risk for COVID-19 are in a state of rapid change based on information released by regulatory bodies including the CDC and federal and state organizations. These policies and algorithms were followed during the patient's care in the ED.  Some ED evaluations and interventions may be delayed as a result of limited staffing during the pandemic.   [place] Controlled Substance Database was reviewed by me. ____________________________________________   FINAL CLINICAL IMPRESSION(S) / ED DIAGNOSES   Final diagnoses:  Cellulitis of lower extremity, unspecified laterality      NEW MEDICATIONS STARTED DURING THIS VISIT:  ED Discharge Orders         Ordered    clindamycin (CLEOCIN) 300 MG capsule  3 times daily        08/17/20 0531           Note:  This document was prepared using Dragon voice recognition software and may include unintentional dictation errors.    Nita Sickle, MD 08/17/20 [phone]

## 2020-08-17 NOTE — ED Notes (Signed)
sunquest continues to be non-functional in room. Urine specimen obtained and sent to lab with patient label sticker.

## 2020-08-17 NOTE — ED Notes (Signed)
Pt continues to yell out. Pt stops yelling when nurse enters room. Pt removes VS equipment and nasal cannula. SPO2 was 88% on RA. [place] replaced and SPO2 now 94% on 2L.

## 2020-08-17 NOTE — ED Notes (Signed)
Pt was yelling out. Entered room. Pt stopped yelling.

## 2020-08-17 NOTE — ED Notes (Signed)
Patient transported to CT 

## 2020-08-17 NOTE — ED Notes (Signed)
sunquest not working in pt room. Troponin collected and sent to lab with pt label sticker.

## 2020-08-17 NOTE — ED Notes (Signed)
Peri care performed and brief changed d/t episode of incontinence.

## 2020-08-17 NOTE — ED Triage Notes (Signed)
BIB EMS from [facility] for changes in speech x 2 days. On arrival speech normal. Pt with bilateral leg swelling and redness. Right leg with drainage.

## 2020-08-22 LAB — CULTURE, BLOOD (ROUTINE X 2)
Culture: NO GROWTH
Culture: NO GROWTH

## 2020-08-26 ENCOUNTER — Encounter: Payer: Self-pay | Admitting: Emergency Medicine

## 2020-08-26 ENCOUNTER — Emergency Department: Payer: Medicare Other

## 2020-08-26 ENCOUNTER — Emergency Department
Admission: EM | Admit: 2020-08-26 | Discharge: 2020-08-26 | Disposition: A | Discharge status: Home / Self Care | Payer: Medicare Other | Attending: Emergency Medicine | Admitting: Emergency Medicine

## 2020-08-26 ENCOUNTER — Other Ambulatory Visit: Payer: Self-pay

## 2020-08-26 DIAGNOSIS — Z87891 Personal history of nicotine dependence: Secondary | ICD-10-CM | POA: Insufficient documentation

## 2020-08-26 DIAGNOSIS — I110 Hypertensive heart disease with heart failure: Secondary | ICD-10-CM | POA: Diagnosis not present

## 2020-08-26 DIAGNOSIS — I5042 Chronic combined systolic (congestive) and diastolic (congestive) heart failure: Secondary | ICD-10-CM | POA: Diagnosis not present

## 2020-08-26 DIAGNOSIS — S0003XA Contusion of scalp, initial encounter: Secondary | ICD-10-CM | POA: Diagnosis not present

## 2020-08-26 DIAGNOSIS — F0390 Unspecified dementia without behavioral disturbance: Secondary | ICD-10-CM | POA: Diagnosis not present

## 2020-08-26 DIAGNOSIS — M25559 Pain in unspecified hip: Secondary | ICD-10-CM

## 2020-08-26 DIAGNOSIS — Z79899 Other long term (current) drug therapy: Secondary | ICD-10-CM | POA: Insufficient documentation

## 2020-08-26 DIAGNOSIS — Z7901 Long term (current) use of anticoagulants: Secondary | ICD-10-CM | POA: Diagnosis not present

## 2020-08-26 DIAGNOSIS — W050XXA Fall from non-moving wheelchair, initial encounter: Secondary | ICD-10-CM | POA: Insufficient documentation

## 2020-08-26 DIAGNOSIS — E039 Hypothyroidism, unspecified: Secondary | ICD-10-CM | POA: Insufficient documentation

## 2020-08-26 DIAGNOSIS — S0990XA Unspecified injury of head, initial encounter: Secondary | ICD-10-CM | POA: Diagnosis present

## 2020-08-26 DIAGNOSIS — M25551 Pain in right hip: Secondary | ICD-10-CM | POA: Insufficient documentation

## 2020-08-26 DIAGNOSIS — M25561 Pain in right knee: Secondary | ICD-10-CM | POA: Insufficient documentation

## 2020-08-26 NOTE — ED Triage Notes (Signed)
Pt comes into the ED via [facility] from [facility] c/o witnessed fall from his wheelchair.  Pt has hematoma to the right side of his head and he has right leg pain.  Pt denies any other complaints and is at baseline neurologically. 102/60, 75 HR, A&Ox3 (baseline).

## 2020-08-26 NOTE — ED Notes (Signed)
Pt has completed xrays and CT imaging studies - results pending

## 2020-08-26 NOTE — ED Notes (Signed)
[facility] TO TRANSPORT TO FACILITY

## 2020-08-26 NOTE — ED Provider Notes (Signed)
[facility] Emergency Department Provider Note  ____________________________________________  Time seen: Approximately 9:57 PM  I have reviewed the triage vital signs and the nursing notes.   HISTORY  Chief Complaint Fall  Level 5 Caveat: Portions of the History and Physical including HPI and review of systems are unable to be completely obtained due to patient being a poor historian    HPI Eric [name] is a 75 y.o. male past history of CHF, atrial fibrillation, dementia, hypertension  who is brought to the ED from his nursing home after a fall.  Patient states he was in his usual state of health, and had a fall out of his wheelchair landing on his right knee.  Complains of pain in the right knee as well as on the right side of his head where he hit his head on the ground.  No loss of consciousness.  No neck pain.  No chest pain or shortness of breath.  Reports that he has been eating and drinking and had no symptoms prior to the fall.     Past Medical History:  Diagnosis Date  . Acute respiratory failure with hypoxia (HCC)    multiple prior admissions to [facility] and [facility] for CHF exacerbation / respiratory failure  . Anxiety and depression   . Atrial fibrillation (HCC) 07/25/2018   new-onset during [facility] admission in Jan 2020  . Cataract   . CHF (congestive heart failure) (HCC)    EF <= 50% as of Jan 2020  . Current use of long term anticoagulation 07/2018   Started on apixaban for new-onset a-fib in Jan 2020  . Dementia (HCC)    possible, likely age-related, documented decreased cognitive function on prior hospitalizations  . Gallstones   . Glaucoma   . Hypertension   . Hypoglycemia   . Hypothyroidism   . Inguinal hernia   . Sleep apnea   . Thyroid disease      Patient Active Problem List   Diagnosis Date Noted  . Bilateral lower leg cellulitis 01/12/2020  . Venous stasis ulcer of both lower extremities without varicose veins (HCC) 01/12/2020   . Chronic respiratory failure with hypoxia (HCC) 01/12/2020  . Cellulitis of right lower extremity   . Severe protein-calorie malnutrition (HCC)   . Right foot ulcer (HCC) 11/16/2019  . Anxiety and depression   . Cellulitis of right foot   . 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 Chronic combined systolic and diastolic CHF (congestive heart failure) (HCC) 11/16/2018  . Anasarca 11/16/2018  . Hypothyroidism 11/16/2018  . PAF (paroxysmal atrial fibrillation) (HCC) 11/16/2018  . HTN (hypertension) 11/16/2018  . Generalized anxiety disorder 11/03/2018  . Major depression in partial remission (HCC) 11/03/2018  . Acute hypoxemic respiratory failure (HCC) 10/29/2018  . Suspected COVID-19 virus infection 10/29/2018     Past Surgical History:  Procedure Laterality Date  . FINGER AMPUTATION Right      Prior to Admission medications   Medication Sig Start Date End Date Taking? Authorizing Provider  acetaminophen (TYLENOL) 325 MG tablet Take 2 tablets (650 mg total) by mouth every 6 (six) hours as needed for mild pain (or Fever >/= 101). Patient taking differently: Take 650 mg by mouth every 6 (six) hours as needed for mild pain or headache (fever >/= 101, or minor discomfort).  11/03/18   Alford Highland, MD  albuterol (VENTOLIN HFA) 108 (90 Base) MCG/ACT inhaler Inhale 2 puffs into the lungs every 6 (six) hours as  needed for wheezing or shortness of breath. Patient taking differently: Inhale 1 puff into the lungs every 6 (six) hours as needed for wheezing or shortness of breath.  11/21/19   Ghimire, Werner Lean, MD  alum & mag hydroxide-simeth (ANTACID) 200-200-20 MG/5ML suspension Take 30 mLs by mouth 4 (four) times daily as needed for indigestion or heartburn.    [provider]  ascorbic acid (VITAMIN C) 500 MG tablet Take 500 mg by mouth 2 (two) times daily. Patient not taking: Reported on 06/08/2020    [provider]  clindamycin  (CLEOCIN) 300 MG capsule Take 1 capsule (300 mg total) by mouth 3 (three) times daily for 10 days. 08/17/20 08/27/20  Nita Sickle, MD  collagenase (SANTYL) ointment Apply topically daily. Apply to right heel ulcer plus dry dressing change daily 11/22/19   Ghimire, Werner Lean, MD  diazepam (VALIUM) 2 MG tablet Take 0.5-1 tablets (1-2 mg total) by mouth See admin instructions. Take 1 mg by mouth in the morning and 2 mg at bedtime 11/21/19   Ghimire, Werner Lean, MD  diphenhydrAMINE (DIPHENHIST) 25 MG tablet Take 25 mg by mouth every 6 (six) hours as needed (for allergic reactions).    [provider]  furosemide (LASIX) 20 MG tablet Take 2 tablets (40 mg total) by mouth daily. 11/21/19   Ghimire, Werner Lean, MD  guaiFENesin (ROBITUSSIN) 100 MG/5ML liquid Take 300 mg by mouth every 6 (six) hours as needed for cough.    [provider]  levothyroxine (SYNTHROID) 200 MCG tablet Take 1 tablet (200 mcg total) by mouth daily before breakfast. 11/21/19   Ghimire, Werner Lean, MD  lidocaine (XYLOCAINE) 1 % (with preservative) injection by Other route once. Used to mix with rocephin    [provider]  liver oil-zinc oxide (DESITIN) 40 % ointment Apply topically daily. Patient taking differently: Apply 1 application topically See admin instructions. Apply to affected area once daily 11/04/18   Alford Highland, MD  loperamide (IMODIUM A-D) 2 MG tablet Take 4 mg by mouth every 3 (three) hours as needed for diarrhea or loose stools (CANNOT EXCEED 8 DOSES/24 HOURS).    [provider]  magnesium hydroxide (MILK OF MAGNESIA) 400 MG/5ML suspension Take 30 mLs by mouth daily as needed for mild constipation.    [provider]  metoCLOPramide (REGLAN) 5 MG tablet Take 5 mg by mouth every 6 (six) hours as needed for nausea or vomiting.    [provider]  Multiple Vitamins-Minerals (THERATRUM COMPLETE) TABS Take 1 tablet by mouth daily with breakfast.    [provider]   mupirocin cream (BACTROBAN) 2 % Apply to affected area twice daily 07/13/20 07/13/21  Willy Eddy, MD  mupirocin ointment (BACTROBAN) 2 % Place 1 application into the nose 2 (two) times daily. 06/10/20   Arnetha Courser, MD  nystatin cream (MYCOSTATIN) Apply 1 application topically 2 (two) times a week.    [provider]  OLANZapine zydis (ZYPREXA) 10 MG disintegrating tablet Take 1 tablet (10 mg total) by mouth 2 (two) times daily. Patient taking differently: Take 15 mg by mouth at bedtime.  11/03/18   Alford Highland, MD  OXYGEN Inhale 2 L/min into the lungs See admin instructions. "2 L/min, as tolerated"    [provider]  Saccharomyces boulardii (PROBIOTIC) 250 MG CAPS Take 1 capsule by mouth in the morning and at bedtime. 07/13/20   Willy Eddy, MD  sertraline (ZOLOFT) 25 MG tablet Take 1 tablet (25 mg total) by mouth  daily. 06/10/19   Charm Rings, NP  Skin Protectants, Misc. (EUCERIN) cream Apply 1 application topically See admin instructions. Apply to both legs 2 times a day Patient not taking: Reported on 06/08/2020    [provider]     Allergies Demerol [meperidine], Epinephrine, Nardil [phenelzine], Morphine and related, Ondansetron, and Shellfish allergy   Family History  Problem Relation Age of Onset  . Alzheimer's disease Mother     Social History Social History   Tobacco Use  . Smoking status: Former Smoker    Quit date: 10/18/2010    Years since quitting: 9.8  . Smokeless tobacco: Never Used  Substance Use Topics  . Alcohol use: No  . Drug use: No    Review of Systems  Constitutional:   No fever or chills.  ENT:   No sore throat. No rhinorrhea. Cardiovascular:   No chest pain or syncope. Respiratory:   No dyspnea or cough. Gastrointestinal:   Negative for abdominal pain, vomiting and diarrhea.  Musculoskeletal: Right scalp pain, right knee pain All other systems reviewed and are negative except as documented above  in ROS and HPI.  ____________________________________________   PHYSICAL EXAM:  VITAL SIGNS: ED Triage Vitals  Enc Vitals Group     BP 08/26/20 1827 123/71     Pulse Rate 08/26/20 1827 75     Resp 08/26/20 1827 17     Temp 08/26/20 1827 (!) 97.5 F (36.4 C)     Temp Source 08/26/20 1827 Oral     SpO2 08/26/20 1827 94 %     Weight 08/26/20 1827 240 lb 4.8 oz (109 kg)     Height 08/26/20 1827 5\' 7"  (1.702 m)     Head Circumference --      Peak Flow --      Pain Score 08/26/20 2115 Asleep     Pain Loc --      Pain Edu? --      Excl. in GC? --     Vital signs reviewed, nursing assessments reviewed.   Constitutional:   Alert and oriented. Non-toxic appearance. Eyes:   Conjunctivae are normal. EOMI. PERRL. ENT      Head:   Normocephalic with small scalp hematoma on the right temporal area.  No laceration.      Nose:   No epistaxis      Mouth/Throat: Moist mucosa, no intraoral injury      Neck:   No meningismus. Full ROM.  Tenderness of C-spine in the area of C6, no step-off or crepitus Hematological/Lymphatic/Immunilogical:   No cervical lymphadenopathy. Cardiovascular:   RRR. Symmetric bilateral radial and DP pulses.  No murmurs. Cap refill less than 2 seconds. Respiratory:   Normal respiratory effort without tachypnea/retractions. Breath sounds are clear and equal bilaterally. No wheezes/rales/rhonchi. Gastrointestinal:   Soft and nontender. Non distended. There is no CVA tenderness.  No rebound, rigidity, or guarding.  Musculoskeletal:   Normal range of motion in all extremities.  There is swelling of the right distal thigh above the knee.  There is tenderness diffusely around the knee without point tenderness.  There is some pain with passive range of motion of the right hip without focal bony tenderness. Neurologic:   Normal speech and language.  Motor grossly intact. No acute focal neurologic deficits are appreciated.  Skin:    Skin is warm, dry and intact. No rash  noted.  No petechiae, purpura, or bullae.  ____________________________________________    LABS (pertinent positives/negatives) (all labs ordered are  listed, but only abnormal results are displayed) Labs Reviewed - No data to display ____________________________________________   EKG    ____________________________________________    RADIOLOGY  CT Head Wo Contrast  Result Date: 08/26/2020 CLINICAL DATA:  Fall from wheelchair EXAM: CT HEAD WITHOUT CONTRAST TECHNIQUE: Contiguous axial images were obtained from the base of the skull through the vertex without intravenous contrast. COMPARISON:  08/17/2020 FINDINGS: Brain: There is atrophy and chronic small vessel disease changes. No acute intracranial abnormality. Specifically, no hemorrhage, hydrocephalus, mass lesion, acute infarction, or significant intracranial injury. Vascular: No hyperdense vessel or unexpected calcification. Skull: No acute calvarial abnormality. Sinuses/Orbits: Visualized paranasal sinuses and mastoids clear. Orbital soft tissues unremarkable. Other: None IMPRESSION: Atrophy, chronic microvascular disease. No acute intracranial abnormality. Electronically Signed   By: Charlett Nose M.D.   On: 08/26/2020 20:02   CT Cervical Spine Wo Contrast  Result Date: 08/26/2020 CLINICAL DATA:  Fall from wheelchair EXAM: CT CERVICAL SPINE WITHOUT CONTRAST TECHNIQUE: Multidetector CT imaging of the cervical spine was performed without intravenous contrast. Multiplanar CT image reconstructions were also generated. COMPARISON:  04/30/2020 FINDINGS: Alignment: Normal Skull base and vertebrae: No acute fracture. No primary bone lesion or focal pathologic process. Soft tissues and spinal canal: No prevertebral fluid or swelling. No visible canal hematoma. Disc levels: Disc space narrowing most pronounced at C3-4 and C6-7. Mild bilateral degenerative facet disease. Upper chest: No acute findings Other: None IMPRESSION: Degenerative disc and  facet disease.  No acute bony abnormality. Electronically Signed   By: Charlett Nose M.D.   On: 08/26/2020 20:03   DG Knee Complete 4 Views Right  Result Date: 08/26/2020 CLINICAL DATA:  Fall, right knee pain EXAM: RIGHT KNEE - COMPLETE 4+ VIEW COMPARISON:  None. FINDINGS: Advanced tricompartment degenerative changes with near complete joint space loss and spurring. Moderate joint effusion. No acute bony abnormality. Specifically, no fracture, subluxation, or dislocation. IMPRESSION: Advanced degenerative changes. Moderate joint effusion. No acute bony abnormality. Electronically Signed   By: Charlett Nose M.D.   On: 08/26/2020 20:01   DG Hip Unilat W or Wo Pelvis 2-3 Views Right  Result Date: 08/26/2020 CLINICAL DATA:  Hip pain status post fall EXAM: DG HIP (WITH OR WITHOUT PELVIS) 2-3V RIGHT COMPARISON:  None. FINDINGS: There are moderate degenerative changes of the right hip. There is no definite acute displaced fracture or dislocation. Osteopenia limits detection of nondisplaced fractures. There are mild-to-moderate degenerative changes of the left hip. There is a large amount of stool at the level of the rectum and partially visualized colon. IMPRESSION: 1. No definite acute displaced fracture or dislocation. Osteopenia limits detection of nondisplaced fractures. 2. Moderate degenerative changes of the right hip. Electronically Signed   By: Katherine Mantle M.D.   On: 08/26/2020 20:03    ____________________________________________   PROCEDURES Procedures  ____________________________________________  DIFFERENTIAL DIAGNOSIS   Intracranial hemorrhage, C-spine fracture, hip fracture, knee fracture  CLINICAL IMPRESSION / ASSESSMENT AND PLAN / ED COURSE  Medications ordered in the ED: Medications - No data to display  Pertinent labs & imaging results that were available during my care of the patient were reviewed by me and considered in my medical decision making (see chart for  details).  Eric [name] was evaluated in Emergency Department on 08/26/2020 for the symptoms described in the history of present illness. He was evaluated in the context of the global COVID-19 pandemic, which necessitated consideration that the patient might be at risk for infection with the SARS-CoV-2 virus that causes COVID-19.  Institutional protocols and algorithms that pertain to the evaluation of patients at risk for COVID-19 are in a state of rapid change based on information released by regulatory bodies including the CDC and federal and state organizations. These policies and algorithms were followed during the patient's care in the ED.   Patient presents with mechanical fall.  Trauma work-up obtained with CT imaging and x-rays, all negative for acute injury.  He appears to have musculoskeletal pain from contusions.  Can take Tylenol, stable for discharge back to his nursing facility.      ____________________________________________   FINAL CLINICAL IMPRESSION(S) / ED DIAGNOSES    Final diagnoses:  Scalp hematoma, initial encounter  Hip pain     ED Discharge Orders    None      Portions of this note were generated with dragon dictation software. Dictation errors may occur despite best attempts at proofreading.   Sharman CheekStafford, Shailen Thielen, MD 08/26/20 2200

## 2020-08-26 NOTE — ED Notes (Signed)
Pt will transport back to [facility] -- pt awaits transport via [facility]

## 2020-08-26 NOTE — ED Notes (Signed)
Pt required 3 person max assist to transfer from w/c to stretcher -- pt is awake and alert -- GCS 15. Pt confirms s/p witnessed fall outside; denies LOC - fell on hard surface- reports head pain after fall - abrasions noted to bald scalp; no obvious deformities or bleeding- pt also reports R hip and R knee pain -- R knee is significantly more enlarged than L knee.  R lower leg wrapped in dressing from pre-arrival with erythema to RLE digits exposed.  RUE also edematous with some erythema - pt states RUE is tender but states RUE appearance is baseline.  RR even and unlabored on RA with symmetrical rise and fall of chest.  Abdomen round and soft-nontender with palpation.  Plan for imaging studies per Dr Scotty Court; no labs or saline lock needed at this time per provider-will monitor for acute changes and maintain plan of care.  Call bell in reach.

## 2020-08-26 NOTE — ED Notes (Signed)
Pt resting with eyes closed; RR even and unlabored on RA however O2 sats 90-91% on RA -- pt placed on suppl O2 via [place] and titrated to 3L to maintain 92%.  Cardiac monitoring and pulse ox continuous monitoring initiated -- SB HR 50s with occ PVC on monitor.  Will continue to monitor for acute changes and maintain plan of care.

## 2020-08-26 NOTE — ED Notes (Signed)
Pt to xray ar this time via stretcher

## 2020-08-26 NOTE — ED Notes (Signed)
Message left with Morrison Old voicemail on plan for pt discharge back to [facility] [place] Morrison Old is legal guardian; ctc [phone])

## 2020-08-26 NOTE — Discharge Instructions (Addendum)
Your CT scan of the head and neck were unremarkable, and x-rays of your right hip and right knee also did not reveal any fractures or other acute injuries.  The pain appears to be due to contusions from the fall.  Please follow-up with your doctor this week for continued monitoring of your symptoms.  You can take Tylenol as needed for pain.

## 2020-09-22 ENCOUNTER — Other Ambulatory Visit: Payer: Self-pay

## 2020-09-22 ENCOUNTER — Emergency Department: Payer: Medicare Other

## 2020-09-22 ENCOUNTER — Emergency Department
Admission: EM | Admit: 2020-09-22 | Discharge: 2020-09-22 | Disposition: A | Discharge status: Home / Self Care | Payer: Medicare Other | Attending: Emergency Medicine | Admitting: Emergency Medicine

## 2020-09-22 DIAGNOSIS — R296 Repeated falls: Secondary | ICD-10-CM

## 2020-09-22 DIAGNOSIS — I5042 Chronic combined systolic (congestive) and diastolic (congestive) heart failure: Secondary | ICD-10-CM | POA: Insufficient documentation

## 2020-09-22 DIAGNOSIS — I110 Hypertensive heart disease with heart failure: Secondary | ICD-10-CM | POA: Insufficient documentation

## 2020-09-22 DIAGNOSIS — W19XXXA Unspecified fall, initial encounter: Secondary | ICD-10-CM | POA: Diagnosis not present

## 2020-09-22 DIAGNOSIS — E039 Hypothyroidism, unspecified: Secondary | ICD-10-CM | POA: Diagnosis not present

## 2020-09-22 DIAGNOSIS — J441 Chronic obstructive pulmonary disease with (acute) exacerbation: Secondary | ICD-10-CM

## 2020-09-22 DIAGNOSIS — Z79899 Other long term (current) drug therapy: Secondary | ICD-10-CM | POA: Insufficient documentation

## 2020-09-22 DIAGNOSIS — E43 Unspecified severe protein-calorie malnutrition: Secondary | ICD-10-CM | POA: Insufficient documentation

## 2020-09-22 DIAGNOSIS — F0391 Unspecified dementia with behavioral disturbance: Secondary | ICD-10-CM | POA: Insufficient documentation

## 2020-09-22 DIAGNOSIS — Z87891 Personal history of nicotine dependence: Secondary | ICD-10-CM | POA: Insufficient documentation

## 2020-09-22 DIAGNOSIS — I480 Paroxysmal atrial fibrillation: Secondary | ICD-10-CM | POA: Diagnosis not present

## 2020-09-22 LAB — CBC WITH DIFFERENTIAL/PLATELET
Abs Immature Granulocytes: 0.08 10*3/uL — ABNORMAL HIGH (ref 0.00–0.07)
Basophils Absolute: 0 10*3/uL (ref 0.0–0.1)
Basophils Relative: 0 %
Eosinophils Absolute: 0.2 10*3/uL (ref 0.0–0.5)
Eosinophils Relative: 2 %
HCT: 43.9 % (ref 39.0–52.0)
Hemoglobin: 14.1 g/dL (ref 13.0–17.0)
Immature Granulocytes: 1 %
Lymphocytes Relative: 10 %
Lymphs Abs: 0.9 10*3/uL (ref 0.7–4.0)
MCH: 29.3 pg (ref 26.0–34.0)
MCHC: 32.1 g/dL (ref 30.0–36.0)
MCV: 91.1 fL (ref 80.0–100.0)
Monocytes Absolute: 0.7 10*3/uL (ref 0.1–1.0)
Monocytes Relative: 8 %
Neutro Abs: 6.9 10*3/uL (ref 1.7–7.7)
Neutrophils Relative %: 79 %
Platelets: 217 10*3/uL (ref 150–400)
RBC: 4.82 MIL/uL (ref 4.22–5.81)
RDW: 13.3 % (ref 11.5–15.5)
WBC: 8.8 10*3/uL (ref 4.0–10.5)
nRBC: 0 % (ref 0.0–0.2)

## 2020-09-22 LAB — URINALYSIS, COMPLETE (UACMP) WITH MICROSCOPIC
Bacteria, UA: NONE SEEN
Bilirubin Urine: NEGATIVE
Glucose, UA: NEGATIVE mg/dL
Hgb urine dipstick: NEGATIVE
Ketones, ur: NEGATIVE mg/dL
Leukocytes,Ua: NEGATIVE
Nitrite: NEGATIVE
Protein, ur: NEGATIVE mg/dL
Specific Gravity, Urine: 1.023 (ref 1.005–1.030)
pH: 5 (ref 5.0–8.0)

## 2020-09-22 LAB — COMPREHENSIVE METABOLIC PANEL
ALT: 10 U/L (ref 0–44)
AST: 15 U/L (ref 15–41)
Albumin: 3.7 g/dL (ref 3.5–5.0)
Alkaline Phosphatase: 71 U/L (ref 38–126)
Anion gap: 8 (ref 5–15)
BUN: 39 mg/dL — ABNORMAL HIGH (ref 8–23)
CO2: 30 mmol/L (ref 22–32)
Calcium: 8.3 mg/dL — ABNORMAL LOW (ref 8.9–10.3)
Chloride: 100 mmol/L (ref 98–111)
Creatinine, Ser: 1.22 mg/dL (ref 0.61–1.24)
GFR, Estimated: >60 mL/min (ref >60)
Glucose, Bld: 96 mg/dL (ref 70–99)
Potassium: 3.8 mmol/L (ref 3.5–5.1)
Sodium: 138 mmol/L (ref 135–145)
Total Bilirubin: 1.4 mg/dL — ABNORMAL HIGH (ref 0.3–1.2)
Total Protein: 7.2 g/dL (ref 6.5–8.1)

## 2020-09-22 MED ORDER — HALOPERIDOL LACTATE 5 MG/ML IJ SOLN
2.5000 mg | Freq: Once | INTRAMUSCULAR | Status: DC
  Filled 2020-09-22: qty 1

## 2020-09-22 NOTE — ED Triage Notes (Signed)
Pt BIBA via [facility] from an unwitnessed fall at [facility]. Pt appears to have bruise on top of head. Pt states "he doesn't feel right" but denies pain. Pt also states he is "damn tired." Pt has hx of dementia.   Pt alert, and oriented to person and placed. Pt in NAD at this time.   EMS vitals:  BP 126/658 HR 78 94% RA

## 2020-09-22 NOTE — ED Notes (Signed)
Pt provided diet coke and graham crackers. Pt resting comfortably at this time, with minimal calling out from room.

## 2020-09-22 NOTE — ED Notes (Signed)
Pt noted to desat to 86% on O2 monitors. This RN placed pt on 2lpm via [place] and O2 remained at 88%. Pt is now sitting comfortably at 5lpm and maintaining O2 sats >94%.

## 2020-09-22 NOTE — ED Notes (Signed)
Pt noted to continuous desat <90%. Pt placed back on 4lpm via [place] while sleeping.

## 2020-09-22 NOTE — ED Provider Notes (Signed)
  Patient received in signout from Dr. Scotty Court pending urinalysis.  UA without infectious features.  And per his original plan, stable for discharge back to SNF.   Delton Prairie, MD 09/22/20 [phone]

## 2020-09-22 NOTE — ED Notes (Signed)
Pt arrived soiled in briefs, pt linens changed, cleaned, and placed on male purewick.

## 2020-09-22 NOTE — ED Notes (Signed)
Patient transported to CT at this time. 

## 2020-09-22 NOTE — ED Notes (Signed)
Pt returned from CT very disgruntled. Pt repeatedly yelling that hospital staff are telling him lies. Pt refuses to wear  saying he "doesn't need oxygen." Pt O2 sats are maintaining >92% at this time.

## 2020-09-22 NOTE — ED Provider Notes (Signed)
[facility] Emergency Department Provider Note  ____________________________________________  Time seen: Approximately 5:48 AM  I have reviewed the triage vital signs and the nursing notes.   HISTORY  Chief Complaint Fall    Level 5 Caveat: Portions of the History and Physical including HPI and review of systems are unable to be completely obtained due to patient being a poor historian   HPI Eric [name] is a 75 y.o. male with a history of dementia CHF COPD atrial fibrillation who was sent to the ED from [facility] due to an unwitnessed fall.  Patient denies any acute complaints, denies pain.      Past Medical History:  Diagnosis Date  . Acute respiratory failure with hypoxia (HCC)    multiple prior admissions to [facility] and [facility] for CHF exacerbation / respiratory failure  . Anxiety and depression   . Atrial fibrillation (HCC) 07/25/2018   new-onset during [facility] admission in Jan 2020  . Cataract   . CHF (congestive heart failure) (HCC)    EF <= 50% as of Jan 2020  . Current use of long term anticoagulation 07/2018   Started on apixaban for new-onset a-fib in Jan 2020  . Dementia (HCC)    possible, likely age-related, documented decreased cognitive function on prior hospitalizations  . Gallstones   . Glaucoma   . Hypertension   . Hypoglycemia   . Hypothyroidism   . Inguinal hernia   . Sleep apnea   . Thyroid disease      Patient Active Problem List   Diagnosis Date Noted  . COPD exacerbation (HCC) 09/22/2020  . Bilateral lower leg cellulitis 01/12/2020  . Venous stasis ulcer of both lower extremities without varicose veins (HCC) 01/12/2020  . Chronic respiratory failure with hypoxia (HCC) 01/12/2020  . Cellulitis of right lower extremity   . Severe protein-calorie malnutrition (HCC)   . Right foot ulcer (HCC) 11/16/2019  . Anxiety and depression   . Cellulitis of right foot   . 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 Chronic combined systolic and diastolic CHF (congestive heart failure) (HCC) 11/16/2018  . Anasarca 11/16/2018  . Hypothyroidism 11/16/2018  . PAF (paroxysmal atrial fibrillation) (HCC) 11/16/2018  . HTN (hypertension) 11/16/2018  . Generalized anxiety disorder 11/03/2018  . Major depression in partial remission (HCC) 11/03/2018  . Acute hypoxemic respiratory failure (HCC) 10/29/2018  . Suspected COVID-19 virus infection 10/29/2018     Past Surgical History:  Procedure Laterality Date  . FINGER AMPUTATION Right      Prior to Admission medications   Medication Sig Start Date End Date Taking? Authorizing Provider  acetaminophen (TYLENOL) 325 MG tablet Take 2 tablets (650 mg total) by mouth every 6 (six) hours as needed for mild pain (or Fever >/= 101). Patient taking differently: Take 650 mg by mouth every 6 (six) hours as needed for mild pain or headache (fever >/= 101, or minor discomfort).  11/03/18   Alford Highland, MD  albuterol (VENTOLIN HFA) 108 (90 Base) MCG/ACT inhaler Inhale 2 puffs into the lungs every 6 (six) hours as needed for wheezing or shortness of breath. Patient taking differently: Inhale 1 puff into the lungs every 6 (six) hours as needed for wheezing or shortness of breath.  11/21/19   Ghimire, Werner Lean, MD  alum & mag hydroxide-simeth (ANTACID) 200-200-20 MG/5ML suspension Take 30 mLs by mouth 4 (four) times daily as needed for indigestion or heartburn.    [provider]  ascorbic acid (VITAMIN C) 500 MG tablet Take 500 mg by mouth 2 (two) times daily. Patient not taking: Reported on 06/08/2020    [provider]  collagenase (SANTYL) ointment Apply topically daily. Apply to right heel ulcer plus dry dressing change daily 11/22/19   Ghimire, Werner Lean, MD  diazepam (VALIUM) 2 MG tablet Take 0.5-1 tablets (1-2 mg total) by mouth See admin instructions. Take 1 mg by mouth in the morning and 2 mg at bedtime 11/21/19   Ghimire,  Werner Lean, MD  diphenhydrAMINE (DIPHENHIST) 25 MG tablet Take 25 mg by mouth every 6 (six) hours as needed (for allergic reactions).    [provider]  furosemide (LASIX) 20 MG tablet Take 2 tablets (40 mg total) by mouth daily. 11/21/19   Ghimire, Werner Lean, MD  guaiFENesin (ROBITUSSIN) 100 MG/5ML liquid Take 300 mg by mouth every 6 (six) hours as needed for cough.    [provider]  levothyroxine (SYNTHROID) 200 MCG tablet Take 1 tablet (200 mcg total) by mouth daily before breakfast. 11/21/19   Ghimire, Werner Lean, MD  lidocaine (XYLOCAINE) 1 % (with preservative) injection by Other route once. Used to mix with rocephin    [provider]  liver oil-zinc oxide (DESITIN) 40 % ointment Apply topically daily. Patient taking differently: Apply 1 application topically See admin instructions. Apply to affected area once daily 11/04/18   Alford Highland, MD  loperamide (IMODIUM A-D) 2 MG tablet Take 4 mg by mouth every 3 (three) hours as needed for diarrhea or loose stools (CANNOT EXCEED 8 DOSES/24 HOURS).    [provider]  magnesium hydroxide (MILK OF MAGNESIA) 400 MG/5ML suspension Take 30 mLs by mouth daily as needed for mild constipation.    [provider]  metoCLOPramide (REGLAN) 5 MG tablet Take 5 mg by mouth every 6 (six) hours as needed for nausea or vomiting.    [provider]  Multiple Vitamins-Minerals (THERATRUM COMPLETE) TABS Take 1 tablet by mouth daily with breakfast.    [provider]  mupirocin cream (BACTROBAN) 2 % Apply to affected area twice daily 07/13/20 07/13/21  Willy Eddy, MD  mupirocin ointment (BACTROBAN) 2 % Place 1 application into the nose 2 (two) times daily. 06/10/20   Arnetha Courser, MD  nystatin cream (MYCOSTATIN) Apply 1 application topically 2 (two) times a week.    [provider]  OLANZapine zydis (ZYPREXA) 10 MG disintegrating tablet Take 1 tablet (10 mg total) by mouth 2 (two) times  daily. Patient taking differently: Take 15 mg by mouth at bedtime.  11/03/18   Alford Highland, MD  OXYGEN Inhale 2 L/min into the lungs See admin instructions. "2 L/min, as tolerated"    [provider]  Saccharomyces boulardii (PROBIOTIC) 250 MG CAPS Take 1 capsule by mouth in the morning and at bedtime. 07/13/20   Willy Eddy, MD  sertraline (ZOLOFT) 25 MG tablet Take 1 tablet (25 mg total) by mouth daily. 06/10/19   Charm Rings, NP  Skin Protectants, Misc. (EUCERIN) cream Apply 1 application topically See admin instructions. Apply to both legs 2 times a day Patient not taking: Reported on 06/08/2020    [provider]     Allergies Demerol [meperidine], Epinephrine, Nardil [phenelzine], Morphine and related, Ondansetron, and Shellfish allergy   Family History  Problem Relation Age of Onset  . Alzheimer's disease Mother     Social History Social History   Tobacco Use  . Smoking status: Former Smoker  Quit date: 10/18/2010    Years since quitting: 9.9  . Smokeless tobacco: Never Used  Substance Use Topics  . Alcohol use: No  . Drug use: No    Review of Systems Level 5 Caveat: Portions of the History and Physical including HPI and review of systems are unable to be completely obtained due to patient being a poor historian   Constitutional:   No known fever.  ENT:   No rhinorrhea. Cardiovascular:   No chest pain or syncope. Respiratory:   No dyspnea or cough. Gastrointestinal:   Negative for abdominal pain, vomiting and diarrhea.  Musculoskeletal:   Negative for focal pain or swelling ____________________________________________   PHYSICAL EXAM:  VITAL SIGNS: ED Triage Vitals  Enc Vitals Group     BP 09/22/20 0357 115/74     Pulse Rate 09/22/20 0357 71     Resp 09/22/20 0357 16     Temp 09/22/20 0357 (!) 97.4 F (36.3 C)     Temp Source 09/22/20 0357 Oral     SpO2 09/22/20 0357 96 %     Weight 09/22/20 0358 204 lb 9.6 oz (92.8 kg)      Height 09/22/20 0358 5\' 7"  (1.702 m)     Head Circumference --      Peak Flow --      Pain Score 09/22/20 0358 0     Pain Loc --      Pain Edu? --      Excl. in GC? --     Vital signs reviewed, nursing assessments reviewed.   Constitutional:   Alert and oriented to person and place. Non-toxic appearance.  Pants are soiled with foul-smelling urine Eyes:   Conjunctivae are normal. EOMI. PERRL. ENT      Head:   Normocephalic with small contusion at the top of the head, no laceration.  No hematoma.      Nose:   No congestion/rhinnorhea.  No epistaxis      Mouth/Throat:   MMM, no pharyngeal erythema. No peritonsillar mass.       Neck:   No meningismus. Full ROM.  No midline tenderness. Hematological/Lymphatic/Immunilogical:   No cervical lymphadenopathy. Cardiovascular:   RRR. Symmetric bilateral radial and DP pulses.  No murmurs. Cap refill less than 2 seconds. Respiratory:   Normal respiratory effort without tachypnea/retractions. Breath sounds are clear and equal bilaterally. No wheezes/rales/rhonchi. Gastrointestinal:   Soft and nontender. Non distended. There is no CVA tenderness.  No rebound, rigidity, or guarding.  Musculoskeletal:   Normal range of motion in all extremities. No joint effusions.  No lower extremity tenderness.  No edema. Neurologic:   Normal speech and language.  Motor grossly intact. No acute focal neurologic deficits are appreciated.  Skin:    Skin is warm, dry and intact. No rash noted.  No petechiae, purpura, or bullae.  ____________________________________________    LABS (pertinent positives/negatives) (all labs ordered are listed, but only abnormal results are displayed) Labs Reviewed  COMPREHENSIVE METABOLIC PANEL - Abnormal; Notable for the following components:      Result Value   BUN 39 (*)    Calcium 8.3 (*)    Total Bilirubin 1.4 (*)    All other components within normal limits  CBC WITH DIFFERENTIAL/PLATELET - Abnormal; Notable for the  following components:   Abs Immature Granulocytes 0.08 (*)    All other components within normal limits  URINE CULTURE  URINALYSIS, COMPLETE (UACMP) WITH MICROSCOPIC   ____________________________________________   EKG Interpreted by me Normal sinus  rhythm rate of 68, normal axis and intervals.  Normal QRS ST segments and T waves.   ____________________________________________    RADIOLOGY  DG Chest 1 View  Result Date: 09/22/2020 CLINICAL DATA:  Weakness and fall EXAM: CHEST  1 VIEW COMPARISON:  07/21/2020 FINDINGS: Large lung volumes and interstitial coarsening. There is streaky density at the bases which is likely atelectasis and scarring when compared with priors. Borderline heart size. Aortic tortuosity. No visible fracture or pneumothorax. IMPRESSION: COPD and pulmonary scarring. No acute finding when compared to prior. Electronically Signed   By: Marnee SpringJonathon  Watts M.D.   On: 09/22/2020 04:32   CT Head Wo Contrast  Result Date: 09/22/2020 CLINICAL DATA:  Unwitnessed fall, head injury EXAM: CT HEAD WITHOUT CONTRAST TECHNIQUE: Contiguous axial images were obtained from the base of the skull through the vertex without intravenous contrast. COMPARISON:  08/26/2020, 01/18/2020 FINDINGS: Brain: Moderate parenchymal volume loss is commensurate with the patient's age and stable since prior examination. Moderate periventricular white matter changes are present likely reflecting the sequela of small vessel ischemia. Focal area of low attenuation within the left basal ganglia is unchanged from multiple prior examinations and is most in keeping with a remote infarct in this region. 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 No evidence of acute intracranial hemorrhage or infarct. No abnormal mass effect or midline shift. No abnormal intra or extra-axial mass lesion or fluid collection. Ventricular size is commensurate with the degree of parenchymal volume loss  and is unchanged. Vascular: No asymmetric hyperdense vasculature at the skull base. Skull: Intact Sinuses/Orbits: The paranasal sinuses are clear. The orbits are unremarkable. Other: Mastoid air cells and middle ear cavities are clear. IMPRESSION: No acute intracranial abnormality.  No calvarial fracture. Senescent changes and remote infarcts, unchanged from prior examination. Electronically Signed   By: Helyn NumbersAshesh  Parikh MD   On: 09/22/2020 04:56    ____________________________________________   PROCEDURES Procedures  ____________________________________________  DIFFERENTIAL DIAGNOSIS   Intracranial hemorrhage, pneumonia, pleural effusion, pulmonary edema, UTI, electrolyte abnormality, dehydration  CLINICAL IMPRESSION / ASSESSMENT AND PLAN / ED COURSE  Medications ordered in the ED: Medications  haloperidol lactate (HALDOL) injection 2.5 mg (2.5 mg Intravenous Not Given 09/22/20 0515)    Pertinent labs & imaging results that were available during my care of the patient were reviewed by me and considered in my medical decision making (see chart for details).   Kary KosDaniel Wigfall [name] was evaluated in Emergency Department on 09/22/2020 for the symptoms described in the history of present illness. He was evaluated in the context of the global COVID-19 pandemic, which necessitated consideration that the patient might be at risk for infection with the SARS-CoV-2 virus that causes COVID-19. Institutional protocols and algorithms that pertain to the evaluation of patients at risk for COVID-19 are in a state of rapid change based on information released by regulatory bodies including the CDC and federal and state organizations. These policies and algorithms were followed during the patient's care in the ED.   Patient presents after an unwitnessed fall.  Patient does not have any acute complaints that he can relate.  Exam shows some minor head trauma and urinary incontinence.  Will check labs, CT head, chest  x-ray, UA and reassess.  ----------------------------------------- 5:51 AM on 09/22/2020 -----------------------------------------  Work-up unremarkable.  Patient remained stable.  Awaiting urinalysis after which patient can be discharged home with or without antibiotics as indicated.      ____________________________________________   FINAL CLINICAL IMPRESSION(S) /  ED DIAGNOSES    Final diagnoses:  Unwitnessed fall  COPD exacerbation (HCC)  Severe protein-calorie malnutrition (HCC)  Chronic combined systolic (congestive) and diastolic (congestive) heart failure (HCC)  PAF (paroxysmal atrial fibrillation) (HCC)  Dementia with behavioral disturbance, unspecified dementia type [facility])     ED Discharge Orders    None      Portions of this note were generated with dragon dictation software. Dictation errors may occur despite best attempts at proofreading.   Sharman Cheek, MD 09/22/20 [phone]

## 2020-09-22 NOTE — ED Notes (Signed)
Called [facility] for transport to [facility]

## 2020-09-23 LAB — URINE CULTURE: Culture: NO GROWTH

## 2020-12-23 ENCOUNTER — Emergency Department
Admission: EM | Admit: 2020-12-23 | Discharge: 2020-12-23 | Disposition: A | Discharge status: Home / Self Care | Payer: Medicare Other | Attending: Emergency Medicine | Admitting: Emergency Medicine

## 2020-12-23 ENCOUNTER — Emergency Department: Payer: Medicare Other

## 2020-12-23 ENCOUNTER — Other Ambulatory Visit: Payer: Self-pay

## 2020-12-23 DIAGNOSIS — R410 Disorientation, unspecified: Secondary | ICD-10-CM | POA: Diagnosis not present

## 2020-12-23 DIAGNOSIS — R1032 Left lower quadrant pain: Secondary | ICD-10-CM | POA: Diagnosis not present

## 2020-12-23 DIAGNOSIS — F0390 Unspecified dementia without behavioral disturbance: Secondary | ICD-10-CM | POA: Diagnosis not present

## 2020-12-23 DIAGNOSIS — I110 Hypertensive heart disease with heart failure: Secondary | ICD-10-CM | POA: Diagnosis not present

## 2020-12-23 DIAGNOSIS — J441 Chronic obstructive pulmonary disease with (acute) exacerbation: Secondary | ICD-10-CM | POA: Diagnosis not present

## 2020-12-23 DIAGNOSIS — Z87891 Personal history of nicotine dependence: Secondary | ICD-10-CM | POA: Diagnosis not present

## 2020-12-23 DIAGNOSIS — Z79899 Other long term (current) drug therapy: Secondary | ICD-10-CM | POA: Insufficient documentation

## 2020-12-23 DIAGNOSIS — E039 Hypothyroidism, unspecified: Secondary | ICD-10-CM | POA: Insufficient documentation

## 2020-12-23 DIAGNOSIS — I5042 Chronic combined systolic (congestive) and diastolic (congestive) heart failure: Secondary | ICD-10-CM | POA: Diagnosis not present

## 2020-12-23 DIAGNOSIS — Z20822 Contact with and (suspected) exposure to covid-19: Secondary | ICD-10-CM | POA: Insufficient documentation

## 2020-12-23 DIAGNOSIS — R0602 Shortness of breath: Secondary | ICD-10-CM | POA: Diagnosis not present

## 2020-12-23 LAB — URINALYSIS, COMPLETE (UACMP) WITH MICROSCOPIC
Bilirubin Urine: NEGATIVE
Glucose, UA: NEGATIVE mg/dL
Hgb urine dipstick: NEGATIVE
Ketones, ur: NEGATIVE mg/dL
Leukocytes,Ua: NEGATIVE
Nitrite: NEGATIVE
Protein, ur: NEGATIVE mg/dL
Specific Gravity, Urine: 1.023 (ref 1.005–1.030)
pH: 5 (ref 5.0–8.0)

## 2020-12-23 LAB — CBC WITH DIFFERENTIAL/PLATELET
Abs Immature Granulocytes: 0.05 10*3/uL (ref 0.00–0.07)
Basophils Absolute: 0 10*3/uL (ref 0.0–0.1)
Basophils Relative: 0 %
Eosinophils Absolute: 0.3 10*3/uL (ref 0.0–0.5)
Eosinophils Relative: 3 %
HCT: 40.4 % (ref 39.0–52.0)
Hemoglobin: 13.2 g/dL (ref 13.0–17.0)
Immature Granulocytes: 1 %
Lymphocytes Relative: 9 %
Lymphs Abs: 0.9 10*3/uL (ref 0.7–4.0)
MCH: 30.7 pg (ref 26.0–34.0)
MCHC: 32.7 g/dL (ref 30.0–36.0)
MCV: 94 fL (ref 80.0–100.0)
Monocytes Absolute: 0.8 10*3/uL (ref 0.1–1.0)
Monocytes Relative: 9 %
Neutro Abs: 7.2 10*3/uL (ref 1.7–7.7)
Neutrophils Relative %: 78 %
Platelets: 158 10*3/uL (ref 150–400)
RBC: 4.3 MIL/uL (ref 4.22–5.81)
RDW: 13.5 % (ref 11.5–15.5)
WBC: 9.3 10*3/uL (ref 4.0–10.5)
nRBC: 0 % (ref 0.0–0.2)

## 2020-12-23 LAB — COMPREHENSIVE METABOLIC PANEL
ALT: 8 U/L (ref 0–44)
AST: 14 U/L — ABNORMAL LOW (ref 15–41)
Albumin: 3.7 g/dL (ref 3.5–5.0)
Alkaline Phosphatase: 55 U/L (ref 38–126)
Anion gap: 5 (ref 5–15)
BUN: 23 mg/dL (ref 8–23)
CO2: 29 mmol/L (ref 22–32)
Calcium: 8.3 mg/dL — ABNORMAL LOW (ref 8.9–10.3)
Chloride: 102 mmol/L (ref 98–111)
Creatinine, Ser: 1.04 mg/dL (ref 0.61–1.24)
GFR, Estimated: >60 mL/min (ref >60)
Glucose, Bld: 84 mg/dL (ref 70–99)
Potassium: 4.2 mmol/L (ref 3.5–5.1)
Sodium: 136 mmol/L (ref 135–145)
Total Bilirubin: 1.4 mg/dL — ABNORMAL HIGH (ref 0.3–1.2)
Total Protein: 6.9 g/dL (ref 6.5–8.1)

## 2020-12-23 LAB — RESP PANEL BY RT-PCR (FLU A&B, COVID) ARPGX2
Influenza A by PCR: NEGATIVE
Influenza B by PCR: NEGATIVE
SARS Coronavirus 2 by RT PCR: NEGATIVE

## 2020-12-23 LAB — BRAIN NATRIURETIC PEPTIDE: B Natriuretic Peptide: 12.2 pg/mL (ref 0.0–100.0)

## 2020-12-23 LAB — PROCALCITONIN: Procalcitonin: <0.1 ng/mL

## 2020-12-23 LAB — TROPONIN I (HIGH SENSITIVITY): Troponin I (High Sensitivity): 3 ng/L (ref <18)

## 2020-12-23 MED ORDER — ONDANSETRON HCL 4 MG/2ML IJ SOLN
4.0000 mg | Freq: Once | INTRAMUSCULAR | Status: AC
  Administered 2020-12-23: 4 mg via INTRAVENOUS
  Filled 2020-12-23: qty 2

## 2020-12-23 MED ORDER — IOHEXOL 350 MG/ML SOLN
75.0000 mL | Freq: Once | INTRAVENOUS | Status: AC | PRN
  Administered 2020-12-23: 75 mL via INTRAVENOUS

## 2020-12-23 MED ORDER — FENTANYL CITRATE (PF) 100 MCG/2ML IJ SOLN
50.0000 ug | Freq: Once | INTRAMUSCULAR | Status: AC
  Administered 2020-12-23: 50 ug via INTRAVENOUS
  Filled 2020-12-23: qty 2

## 2020-12-23 NOTE — ED Notes (Signed)
Pt's social worker contacted regarding pt's discharge and need for transportation back to [facility]. Social worker states that she will attempt to get transportation for pt and call back with more information.

## 2020-12-23 NOTE — ED Notes (Signed)
Pt refused blood draw for repeat troponin.

## 2020-12-23 NOTE — Discharge Instructions (Addendum)
Please follow-up with your doctor in 2 days for recheck/reevaluation.  Return to the emergency department for any symptoms personally concerning to yourself.

## 2020-12-23 NOTE — ED Provider Notes (Signed)
-----------------------------------------   4:33 PM on 12/23/2020 -----------------------------------------  CT is essentially negative for acute abnormality.  No PE.  Patient satting once again in the low to mid 90s on room air.  Given the patient's reassuring work-up.  I believe the patient is safe for discharge back to his nursing facility.  Currently refusing repeat lab work to recheck a troponin, however given his negative initial troponin I do not believe this is absolutely necessary.  Negative COVID.  Negative procalcitonin.  We will discharge with PCP follow-up.   Minna Antis, MD 12/23/20 [phone]

## 2020-12-23 NOTE — ED Notes (Signed)
Pt's legal guardian attempted to be contacted but unsuccessful at this time.

## 2020-12-23 NOTE — ED Notes (Signed)
[facility]  CALLED  FOR  TRANSPORT  TO  [facility]  

## 2020-12-23 NOTE — ED Provider Notes (Signed)
[facility] Emergency Department Provider Note  ____________________________________________   Event Date/Time   First MD Initiated Contact with Patient 12/23/20 1338     (approximate)  I have reviewed the triage vital signs and the nursing notes.   HISTORY  Chief Complaint Abdominal Pain and Hypoxia   HPI Eric [name] is a [age] y.o. male with a past medical history of A. fib not currently anticoagulated, HTN, CHF, hypothyroidism, OSA, hypogonadism and dementia who presents via EMS from nursing facility for assessment of 2 or 3 days of some lower abdominal pain associate with nausea.  Patient also reportedly had an SPO2 of 88% on EMS arrival which improved to 93% on 2 L nasal cannula.  Patient reports not wearing oxygen at baseline.  He states he has a little bit of cough and shortness of breath but no chest pain, fevers and sample specify how long its been going on.  He endorses some lower abdominal discomfort that he thinks is administrative days.  He denies any vomiting, diarrhea, dysuria, burning with urination, rash or recent injuries or falls.  No other acute concerns at this time.         Past Medical History:  Diagnosis Date  . Acute respiratory failure with hypoxia (HCC)    multiple prior admissions to [facility] and [facility] for CHF exacerbation / respiratory failure  . Anxiety and depression   . Atrial fibrillation (HCC) 07/25/2018   new-onset during [facility] admission in Jan 2020  . Cataract   . CHF (congestive heart failure) (HCC)    EF <= 50% as of Jan 2020  . Current use of long term anticoagulation 07/2018   Started on apixaban for new-onset a-fib in Jan 2020  . Dementia (HCC)    possible, likely age-related, documented decreased cognitive function on prior hospitalizations  . Gallstones   . Glaucoma   . Hypertension   . Hypoglycemia   . Hypothyroidism   . Inguinal hernia   . Sleep apnea   . Thyroid disease     Patient Active Problem List    Diagnosis Date Noted  . COPD exacerbation (HCC) 09/22/2020  . Bilateral lower leg cellulitis 01/12/2020  . Venous stasis ulcer of both lower extremities without varicose veins (HCC) 01/12/2020  . Chronic respiratory failure with hypoxia (HCC) 01/12/2020  . Cellulitis of right lower extremity   . Severe protein-calorie malnutrition (HCC)   . Right foot ulcer (HCC) 11/16/2019  . Anxiety and depression   . Cellulitis of right foot   . 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 Chronic combined systolic and diastolic CHF (congestive heart failure) (HCC) 11/16/2018  . Anasarca 11/16/2018  . Hypothyroidism 11/16/2018  . PAF (paroxysmal atrial fibrillation) (HCC) 11/16/2018  . HTN (hypertension) 11/16/2018  . Generalized anxiety disorder 11/03/2018  . Major depression in partial remission (HCC) 11/03/2018  . Acute hypoxemic respiratory failure (HCC) 10/29/2018  . Suspected COVID-19 virus infection 10/29/2018    Past Surgical History:  Procedure Laterality Date  . FINGER AMPUTATION Right     Prior to Admission medications   Medication Sig Start Date End Date Taking? Authorizing Provider  acetaminophen (TYLENOL) 500 MG tablet Take 500 mg by mouth every 8 (eight) hours as needed for mild pain or fever.   Yes [provider]  albuterol (PROVENTIL) (2.5 MG/3ML) 0.083% nebulizer solution Take 2.5 mg by nebulization every 6 (six) hours as needed for wheezing or shortness of breath.   Yes [provider]  alum & mag hydroxide-simeth (MAALOX/MYLANTA) 200-200-20 MG/5ML suspension Take 30 mLs by mouth 4 (four) times daily as needed for indigestion or heartburn.   Yes [provider]  guaiFENesin (ROBITUSSIN) 100 MG/5ML liquid Take 300 mg by mouth every 6 (six) hours as needed for cough.   Yes [provider]  haloperidol lactate (HALDOL) 5 MG/ML injection Inject 5 mg into the muscle every 12 (twelve) hours as needed  (aggression).   Yes [provider]  levothyroxine (SYNTHROID) 200 MCG tablet Take 1 tablet (200 mcg total) by mouth daily before breakfast. 11/21/19  Yes Ghimire, Werner Lean, MD  liver oil-zinc oxide (DESITIN) 40 % ointment Apply topically daily. Patient taking differently: Apply 1 application topically See admin instructions. Apply topically to buttocks and scrotal area with shift changes and after episodes of incontinence 11/04/18  Yes Wieting, Richard, MD  LORazepam (ATIVAN) 2 MG/ML injection Inject 1 mg into the vein every 12 (twelve) hours as needed for sedation.   Yes [provider]  magnesium hydroxide (MILK OF MAGNESIA) 400 MG/5ML suspension Take 30 mLs by mouth daily as needed for moderate constipation.   Yes [provider]  NONFORMULARY OR COMPOUNDED ITEM Apply 1 mg topically 2 (two) times daily as needed (anxiety). LORAZEPAM 1mg /ml TOPICAL GEL   Yes [provider]  ondansetron (ZOFRAN) 4 MG tablet Take 4 mg by mouth every 6 (six) hours as needed for nausea or vomiting.   Yes [provider]  risperiDONE (RISPERDAL) 1 MG/ML oral solution Take 1 mg by mouth 2 (two) times daily.   Yes [provider]  torsemide (DEMADEX) 20 MG tablet Take 20 mg by mouth 2 (two) times daily.   Yes [provider]    Allergies Demerol [meperidine], Epinephrine, Nardil [phenelzine], Morphine and related, Ondansetron, and Shellfish allergy  Family History  Problem Relation Age of Onset  . Alzheimer's disease Mother     Social History Social History   Tobacco Use  . Smoking status: Former Smoker    Quit date: 10/18/2010    Years since quitting: 10.1  . Smokeless tobacco: Never Used  Substance Use Topics  . Alcohol use: No  . Drug use: No    Review of Systems  Review of Systems  Unable to perform ROS: Dementia  Respiratory: Positive for cough and shortness of breath.   Gastrointestinal: Positive for abdominal pain.       ____________________________________________   PHYSICAL EXAM:  VITAL SIGNS: ED Triage Vitals  Enc Vitals Group     BP      Pulse      Resp      Temp      Temp src      SpO2      Weight      Height      Head Circumference      Peak Flow      Pain Score      Pain Loc      Pain Edu?      Excl. in GC?    Vitals:   12/23/20 1500 12/23/20 1600  BP: 140/89 133/78  Pulse: 72 66  Resp: 19 14  Temp:    SpO2: 92% 92%   Physical Exam Vitals and nursing note reviewed.  Constitutional:      Appearance: He is well-developed.  HENT:     Head: Normocephalic and atraumatic.     Right Ear: External ear normal.     Left Ear: External ear  normal.     Nose: Nose normal.  Eyes:     Conjunctiva/sclera: Conjunctivae normal.  Cardiovascular:     Rate and Rhythm: Normal rate and regular rhythm.     Heart sounds: No murmur heard.   Pulmonary:     Effort: Pulmonary effort is normal. No respiratory distress.     Breath sounds: Decreased breath sounds present.  Abdominal:     Palpations: Abdomen is soft.     Tenderness: There is abdominal tenderness in the suprapubic area. There is no right CVA tenderness or left CVA tenderness.  Musculoskeletal:     Cervical back: Neck supple.     Right lower leg: Edema present.     Left lower leg: Edema present.  Skin:    General: Skin is warm and dry.  Neurological:     Mental Status: He is alert. Mental status is at baseline. He is disoriented and confused.     Inguinal and scrotum are unremarkable bilaterally. ____________________________________________   LABS (all labs ordered are listed, but only abnormal results are displayed)  Labs Reviewed  COMPREHENSIVE METABOLIC PANEL - Abnormal; Notable for the following components:      Result Value   Calcium 8.3 (*)    AST 14 (*)    Total Bilirubin 1.4 (*)    All other components within normal limits  URINALYSIS, COMPLETE (UACMP) WITH MICROSCOPIC - Abnormal; Notable for the following  components:   Color, Urine YELLOW (*)    APPearance CLEAR (*)    Bacteria, UA RARE (*)    All other components within normal limits  RESP PANEL BY RT-PCR (FLU A&B, COVID) ARPGX2  CBC WITH DIFFERENTIAL/PLATELET  PROCALCITONIN  BRAIN NATRIURETIC PEPTIDE  TROPONIN I (HIGH SENSITIVITY)  TROPONIN I (HIGH SENSITIVITY)   ____________________________________________  EKG  Sinus rhythm with a ventricular to 71, normal axis, PR interval of 226 with otherwise unremarkable intervals and no clear evidence of acute ischemia. ____________________________________________  RADIOLOGY  ED MD interpretation: Chest x-ray has some low lung volumes and opacities likely atelectasis less likely pneumonia without large effusion, overt edema or any other clear acute thoracic process.  CT abdomen pelvis shows no clear acute abdominopelvic process.  Stable umbilical hernia with a small loop of bowel without evidence of incarceration or obstruction.  Official radiology report(s): CT ABDOMEN PELVIS WO CONTRAST  Result Date: 12/23/2020 CLINICAL DATA:  Abdominal pain and hypoxia. EXAM: CT ABDOMEN AND PELVIS WITHOUT CONTRAST TECHNIQUE: Multidetector CT imaging of the abdomen and pelvis was performed following the standard protocol without IV contrast. COMPARISON:  CT scan 06/04/2020 FINDINGS: Lower chest: The lung bases are clear of acute process. No pleural effusion or pulmonary lesions. The heart is normal in size. No pericardial effusion. The distal esophagus and aorta are unremarkable. Hepatobiliary: No hepatic lesions or intrahepatic biliary dilatation. Gallbladder is mildly contracted. No common bile duct dilatation. Pancreas: No mass, inflammation or ductal dilatation. Spleen: Normal size.  No focal lesions. Adrenals/Urinary Tract: Adrenal glands and kidneys are unremarkable. No renal lesions or hydronephrosis. No renal or obstructing ureteral calculi. The bladder is unremarkable. Stomach/Bowel: Stomach, duodenum,  small bowel and colon are grossly normal without oral contrast. No acute inflammatory process, mass lesion or obstructive findings. The terminal ileum and appendix are unremarkable. Vascular/Lymphatic: Stable aortic and branch vessel calcifications no focal aneurysm. Small scattered mesenteric and retroperitoneal lymph nodes but no mass or adenopathy. Reproductive: The prostate gland and seminal vesicles are unremarkable. Other: No pelvic mass or adenopathy. No free pelvic fluid collections.  No inguinal mass or adenopathy. There is a stable appearing periumbilical abdominal wall hernia containing a small bowel loop and a small amount of fluid. No evidence of incarceration or obstruction. Mild presacral edema. Musculoskeletal: No significant bony findings. IMPRESSION: 1. No acute abdominal/pelvic findings, mass lesion or adenopathy. 2. Stable periumbilical abdominal wall hernia containing a small bowel loop and a small amount of fluid. No evidence of incarceration or obstruction. 3. Aortic atherosclerosis. Aortic Atherosclerosis (ICD10-I70.0). Electronically Signed   By: Rudie Meyer M.D.   On: 12/23/2020 14:34   CT Angio Chest PE W and/or Wo Contrast  Result Date: 12/23/2020 CLINICAL DATA:  Hypoxia. EXAM: CT ANGIOGRAPHY CHEST WITH CONTRAST TECHNIQUE: Multidetector CT imaging of the chest was performed using the standard protocol during bolus administration of intravenous contrast. Multiplanar CT image reconstructions and MIPs were obtained to evaluate the vascular anatomy. CONTRAST:  58mL OMNIPAQUE IOHEXOL 350 MG/ML SOLN COMPARISON:  None. FINDINGS: Cardiovascular: Satisfactory opacification of the pulmonary arteries to the segmental level. No evidence of pulmonary embolism. Normal heart size. No pericardial effusion. Atherosclerosis of thoracic aorta is noted without aneurysm formation. Mild coronary calcifications are noted. Mediastinum/Nodes: No enlarged mediastinal, hilar, or axillary lymph nodes. Thyroid  gland, trachea, and esophagus demonstrate no significant findings. Lungs/Pleura: No pneumothorax or pleural effusion is noted. Mild bibasilar subsegmental atelectasis or scarring is noted. Emphysematous disease is noted in the upper lobes. Upper Abdomen: No acute abnormality. Musculoskeletal: No chest wall abnormality. No acute or significant osseous findings. Review of the MIP images confirms the above findings. IMPRESSION: No definite evidence of pulmonary embolus. Mild coronary artery calcifications are noted. Mild bibasilar subsegmental atelectasis or scarring is noted. Aortic Atherosclerosis (ICD10-I70.0) and Emphysema (ICD10-J43.9). Electronically Signed   By: Lupita Raider M.D.   On: 12/23/2020 15:57   DG Chest Portable 1 View  Result Date: 12/23/2020 CLINICAL DATA:  Shortness of breath EXAM: PORTABLE CHEST 1 VIEW COMPARISON:  09/22/2020 FINDINGS: Stable cardiomediastinal contours. Low lung volumes with mild streaky bibasilar opacities, favor scarring or atelectasis. No evidence of pleural effusion or pneumothorax. Advanced degenerative changes of the bilateral shoulders, right worse than left. IMPRESSION: Low lung volumes with mild streaky bibasilar opacities, favor scarring or atelectasis. Electronically Signed   By: Duanne Guess D.O.   On: 12/23/2020 14:13    ____________________________________________   PROCEDURES  Procedure(s) performed (including Critical Care):  .1-3 Lead EKG Interpretation Performed by: Gilles Chiquito, MD Authorized by: Gilles Chiquito, MD     Interpretation: normal     ECG rate assessment: normal     Rhythm: sinus rhythm     Ectopy: none     Conduction: abnormal     Abnormal conduction: 1st degree AV block       ____________________________________________   INITIAL IMPRESSION / ASSESSMENT AND PLAN / ED COURSE        Presents with above to history exam for seemingly 2 separate complaints.  First seems she had a low SPO2 noted by nursing  staff morning 88% requiring 2 L endorses couple days of cough and shortness of breath.  Second he states he had a couple days of lower abdominal pain.  On arrival he is afebrile and hemodynamically stable. On arrival room air SPO2 on room air of 98% on several rechecks it was noted to be 90% and 92%.  He was subsequently placed on 2 L.  With regard to his cough shortness of breath and hypoxia differential includes ACS, pneumonia, PE, heart failure, anemia and bronchitis  or effusion.  Chest x-ray shows no clear evidence of pneumonia or acute heart failure.  ECG shows no evidence of significant arrhythmia or clear ischemia and given nonelevated troponin and overall Evalose suspicion for ACS.  BNP is double digits and while patient is some lower extremity edema he has no overt edema on his chest x-ray and I have a low suspicion for heart failure at this time.  Will obtain CTA chest to rule out PE.  With regard to his lower abdominal discomfort differential includes possible diverticulitis, cystitis, kidney stone, MSK versus new hernia.  His umbilical hernia is easily reducible and he has no palpable or clear inguinal or scrotal abnormalities on exam.   CBC is unremarkable. CMP w/o any significant derangements. No evidence of hepatitis/cholestasis. UA unremarkable for evidence of infection. CT A/P w/o any clear acute abdominal or pelvic pathology. Stable periumbilical hernia noted w/o evidence of obstruction/incerceration. No evidence of kidney stone, diverticulitis, appendicitis, or other acute pathology. Unclear etiology at this time for lower abdominal discomfort although I do not believe patient requires additional emergent workup for this and he is safe for continued outpatient evaluation w/ regard to his lower abdominal pain.   Care of patient signed over to oncoming doc at 1500. Plan is to f/u CTA chest and reassess.      ____________________________________________   FINAL CLINICAL  IMPRESSION(S) / ED DIAGNOSES  Final diagnoses:  Left lower quadrant abdominal pain  SOB (shortness of breath)    Medications  fentaNYL (SUBLIMAZE) injection 50 mcg (50 mcg Intravenous Given 12/23/20 1416)  ondansetron (ZOFRAN) injection 4 mg (4 mg Intravenous Given 12/23/20 1416)  iohexol (OMNIPAQUE) 350 MG/ML injection 75 mL (75 mLs Intravenous Contrast Given 12/23/20 1530)     ED Discharge Orders    None       Note:  This document was prepared using Dragon voice recognition software and may include unintentional dictation errors.   Gilles Chiquito, MD 12/23/20 [phone]

## 2020-12-23 NOTE — ED Triage Notes (Signed)
Pt presents to the [facility] via EMS from [facility] with c/o hypoxia and abd pain. EMS states that [facility] staff called EMS due to oxygen saturation in the upper 80's. Pt was placed on [facility] en route which brought saturation up to upper 90's. Pt now c/o lower abd pain with nausea that has been ongoing for 3-4 days.

## 2021-03-06 IMAGING — CT CT HEAD W/O CM
3 of 4 series · 16 of 47 positions shown, 19 images · non-contrast
Comparison: None.

CLINICAL DATA: Altered mental status.

EXAM:
CT HEAD WITHOUT CONTRAST
TECHNIQUE: Contiguous axial images were obtained from the base of the skull
through the vertex without intravenous contrast.

[Series 2: head wo · axial · 0.40mm/px · z∈[-96,+39]mm · 10 of 31 slices shown, 13 images]
[im 2/31  brain]
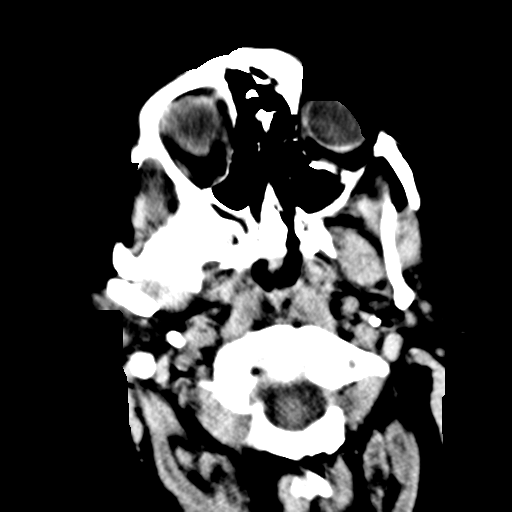
[im 2/31  bone]
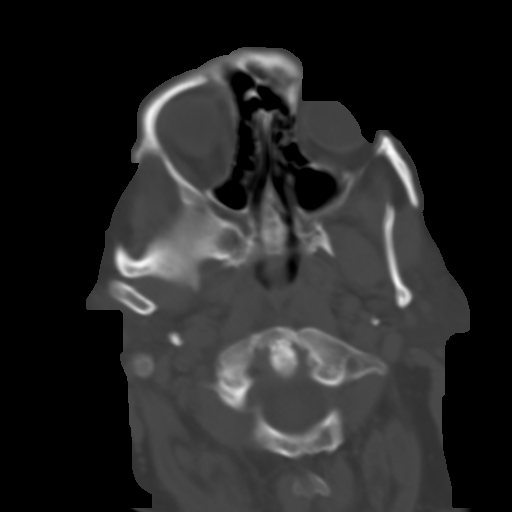
[im 5/31  brain]
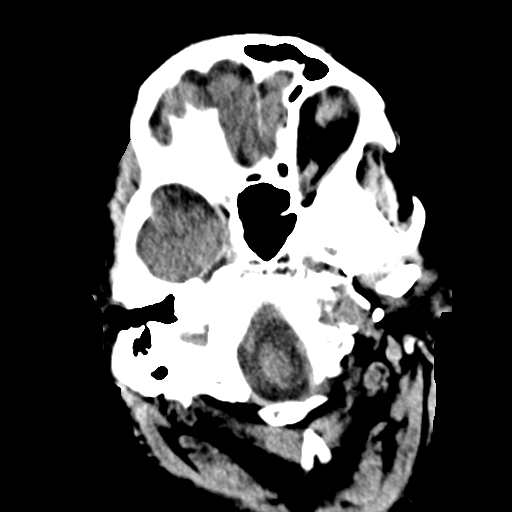
[im 8/31  brain]
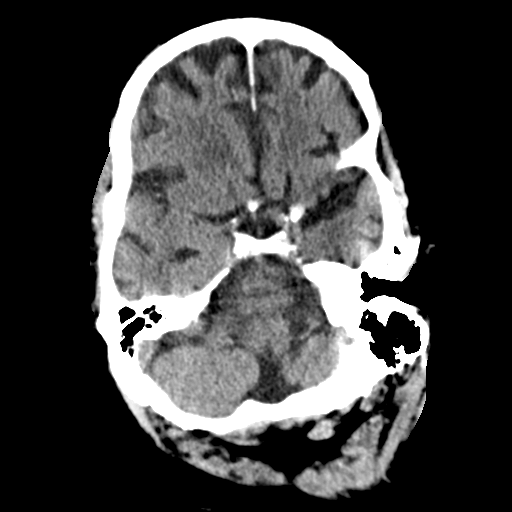
[im 11/31  brain]
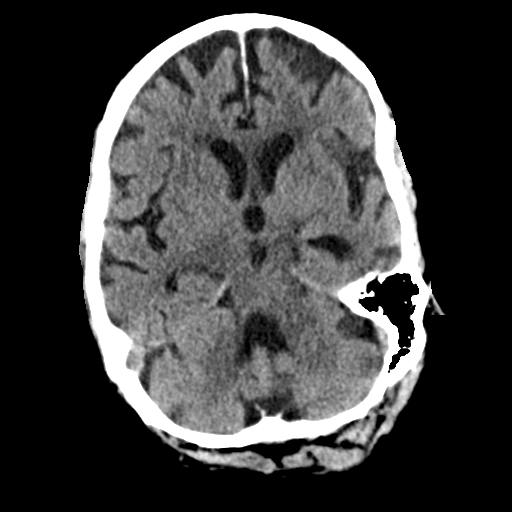
[im 14/31  brain]
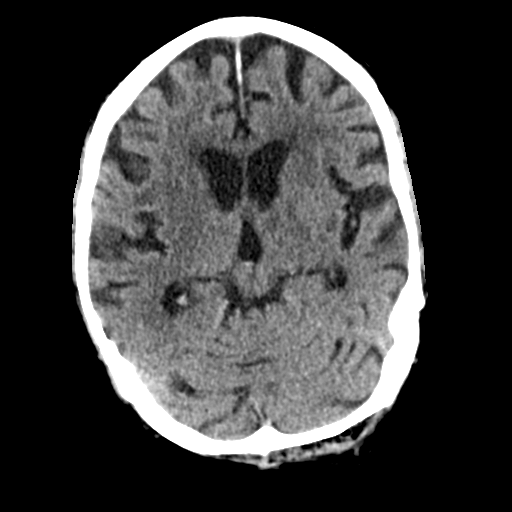
[im 14/31  bone]
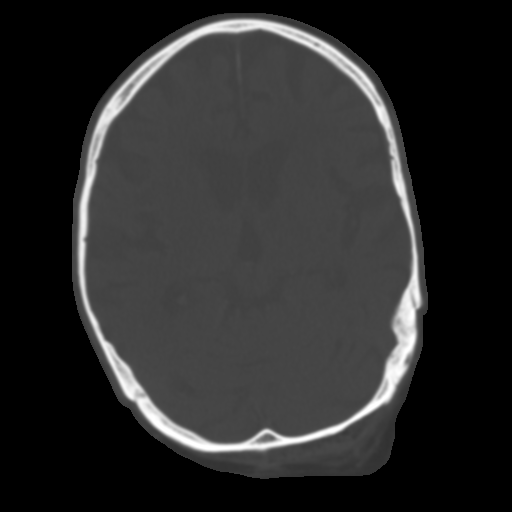
[im 17/31  brain]
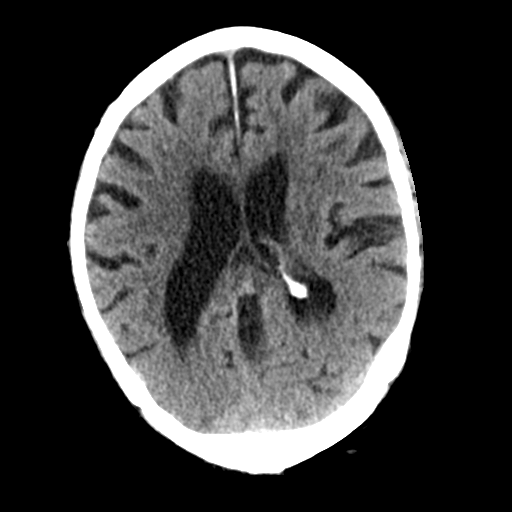
[im 20/31  brain]
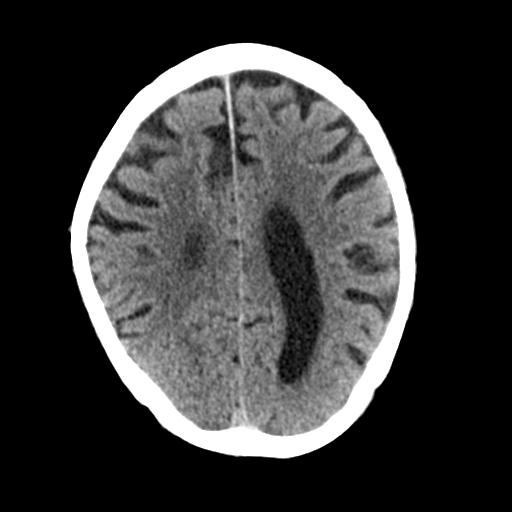
[im 23/31  brain]
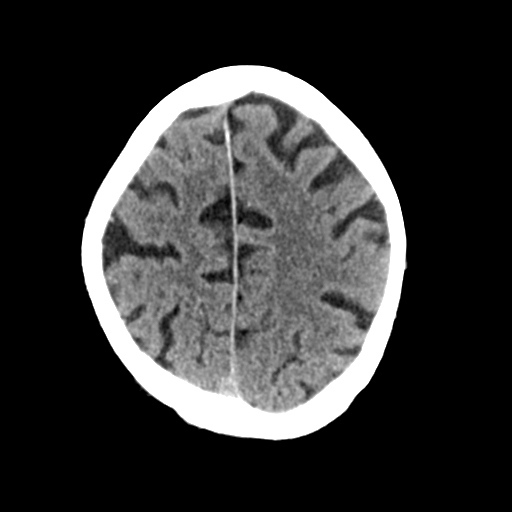
[im 26/31  brain]
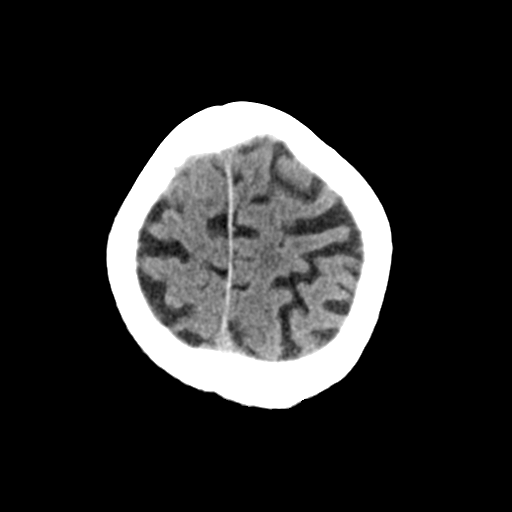
[im 26/31  bone]
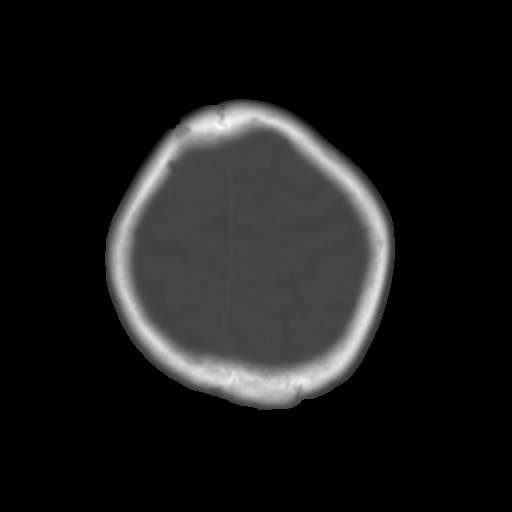
[im 29/31  brain]
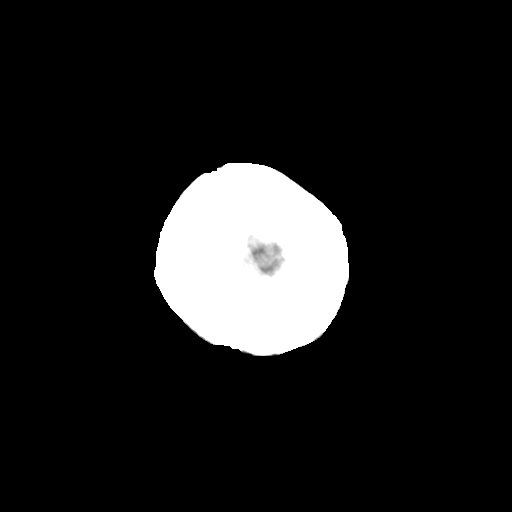

[Series 4: coronal soft tissue · coronal · 0.30mm/px · 3 of 69 slices shown]
[im 23/69  brain]
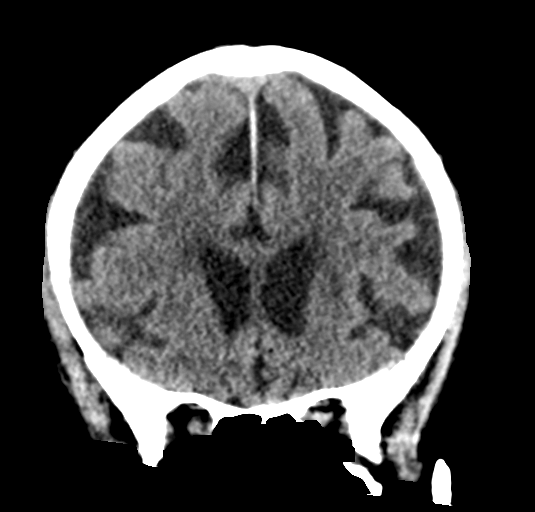
[im 31/69  brain]
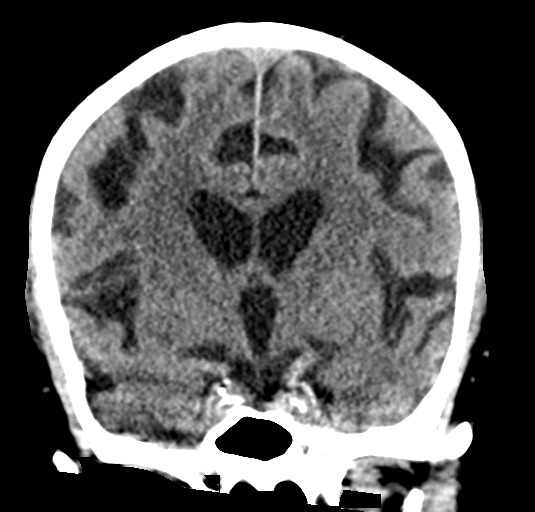
[im 38/69  brain]
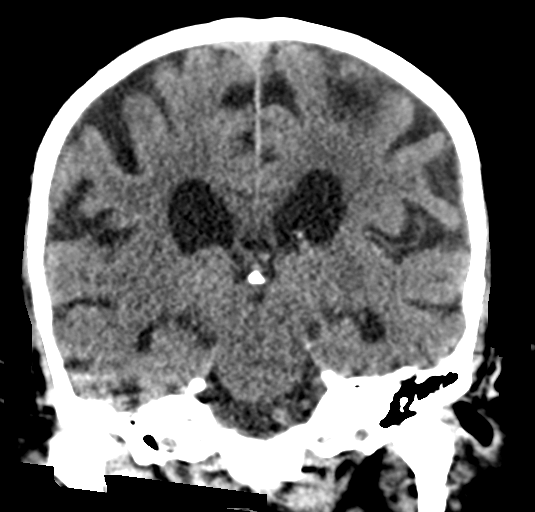

[Series 5: sagittal soft tissue · sagittal · 0.33mm/px · 3 of 55 slices shown]
[im 22/55  brain]
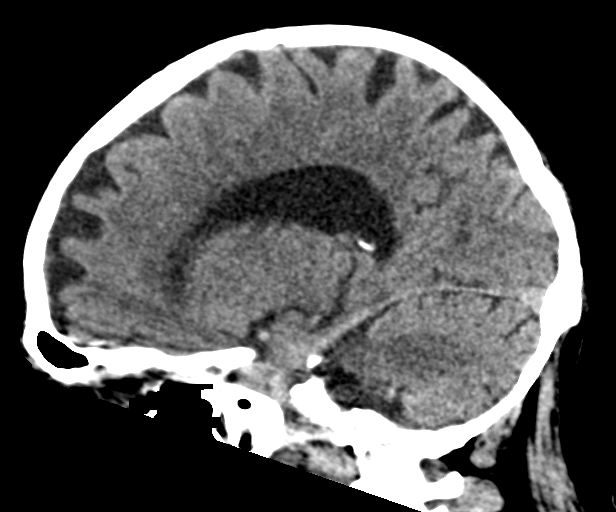
[im 28/55  brain]
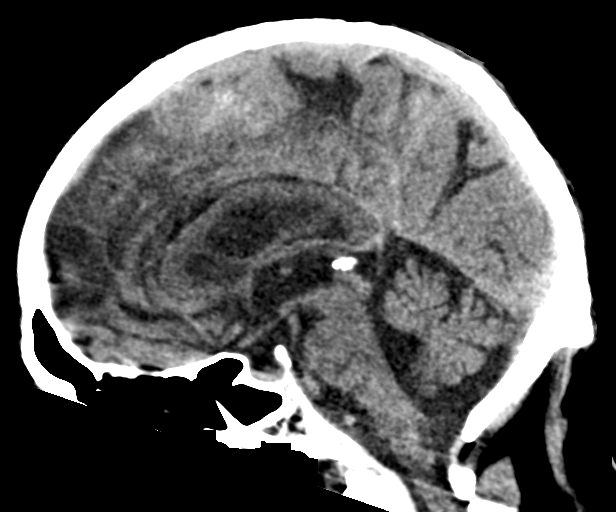
[im 33/55  brain]
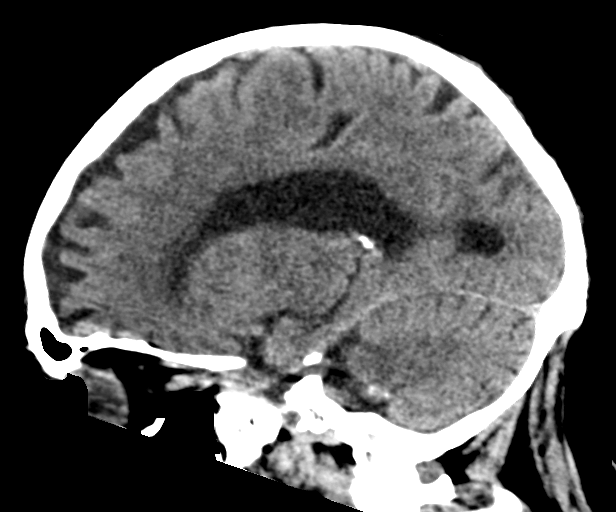

[16 of 47 positions shown; findings below may reference images not displayed]

FINDINGS: Brain: No evidence of acute infarction, hemorrhage, hydrocephalus,
extra-axial collection or mass lesion/mass effect. Moderate
generalized cerebral atrophy. Scattered mild periventricular and
subcortical white matter hypodensities are nonspecific, but favored
to reflect chronic microvascular ischemic changes.

Vascular: Atherosclerotic vascular calcification of the carotid
siphons. No hyperdense vessel.

Skull: Normal. Negative for fracture or focal lesion.

Sinuses/Orbits: No acute finding.

Other: None.
IMPRESSION: 1. No acute intracranial abnormality.
2. Moderate cerebral atrophy and mild chronic microvascular ischemic
changes.

## 2021-06-17 IMAGING — CT CT CERVICAL SPINE W/O CM
4 of 8 series · 11 of 33 positions shown, 12 images · non-contrast
Comparison: CT head without contrast 01/18/2020.

CLINICAL DATA: Head trauma, moderate to severe.  Abrasion to head.

EXAM:
CT HEAD WITHOUT CONTRAST
CT CERVICAL SPINE WITHOUT CONTRAST
TECHNIQUE: Multidetector CT imaging of the head and cervical spine was
performed following the standard protocol without intravenous
contrast. Multiplanar CT image reconstructions of the cervical spine
were also generated.

[Series 6: sagittal bone · sagittal · 0.23mm/px · 5 of 68 slices shown]
[im 12/68  bone]
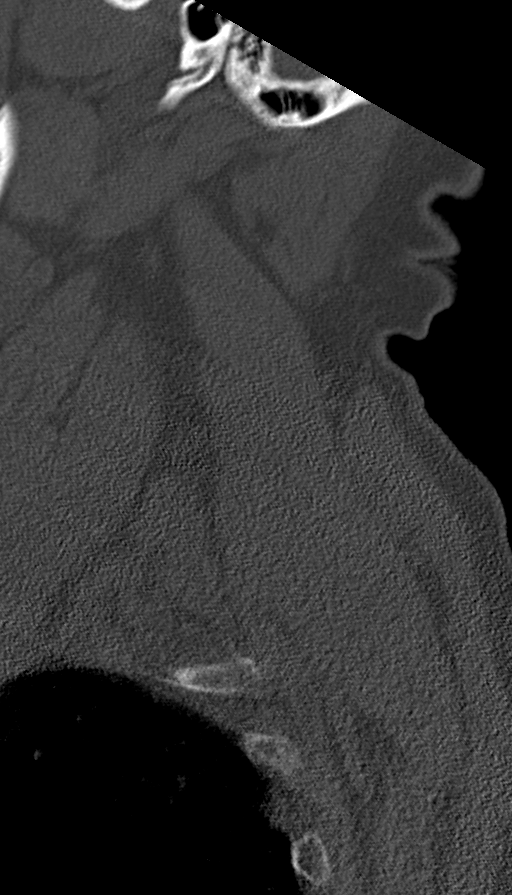
[im 23/68  bone]
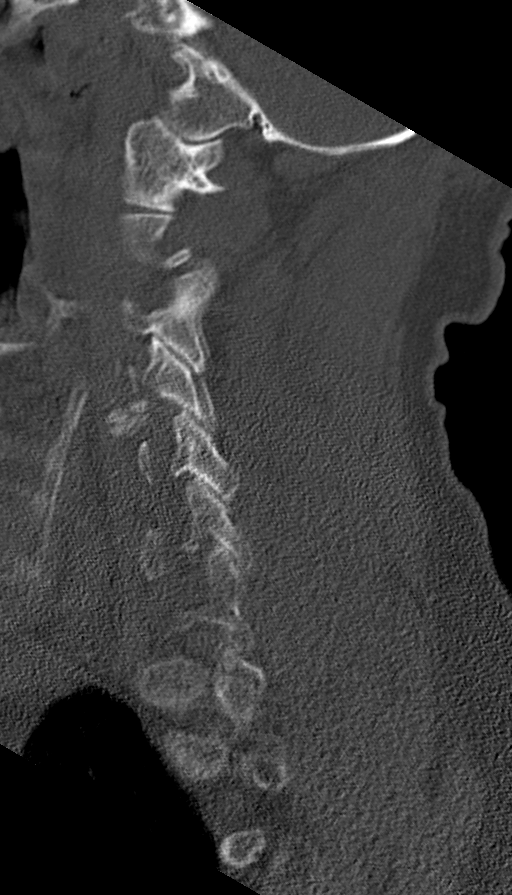
[im 34/68  bone]
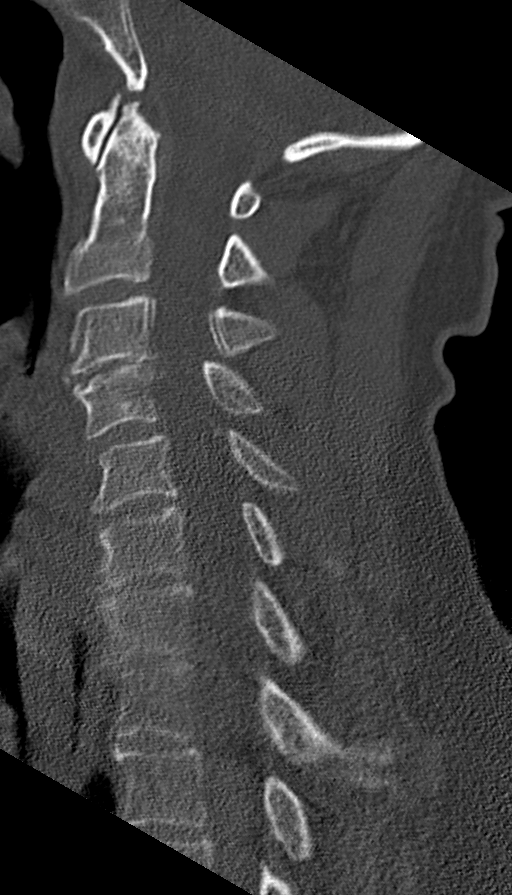
[im 45/68  bone]
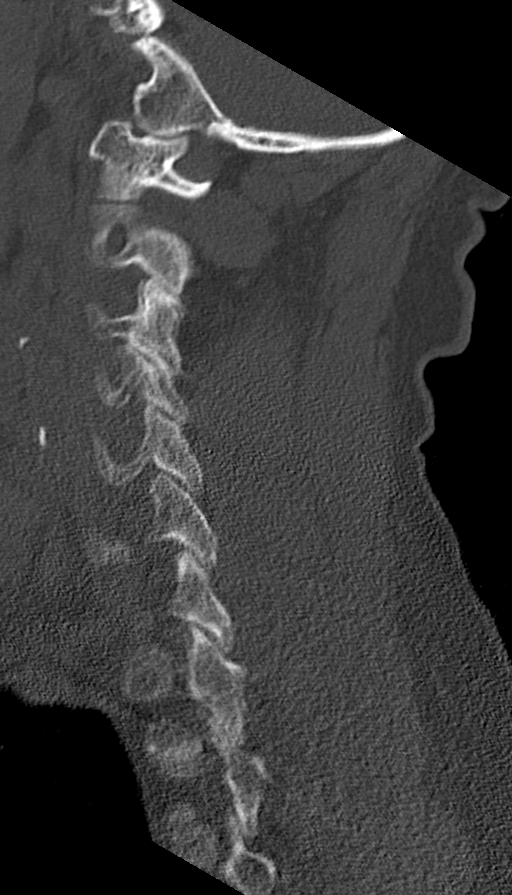
[im 56/68  bone]
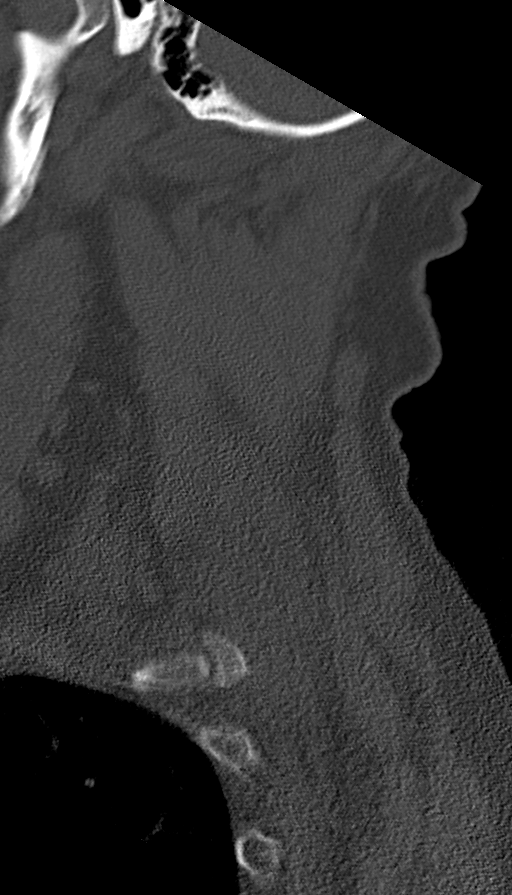

[Series 7: coronal bone · coronal · 0.26mm/px · 3 of 60 slices shown]
[im 7/60  bone]
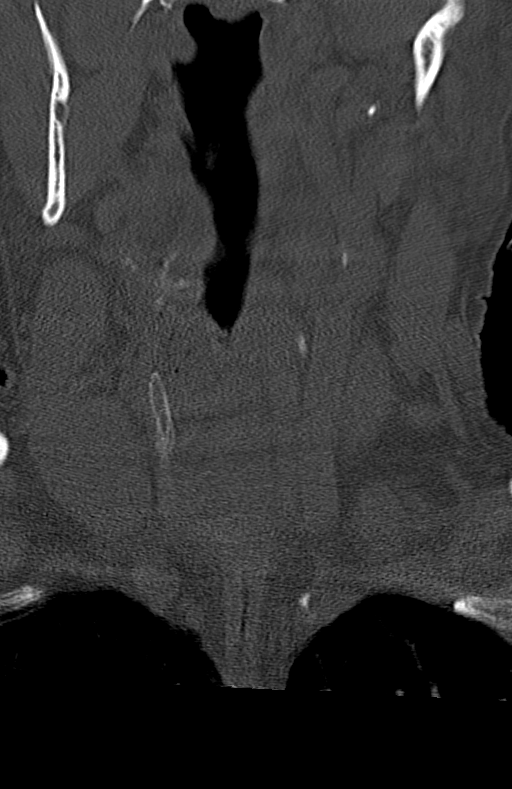
[im 31/60  bone]
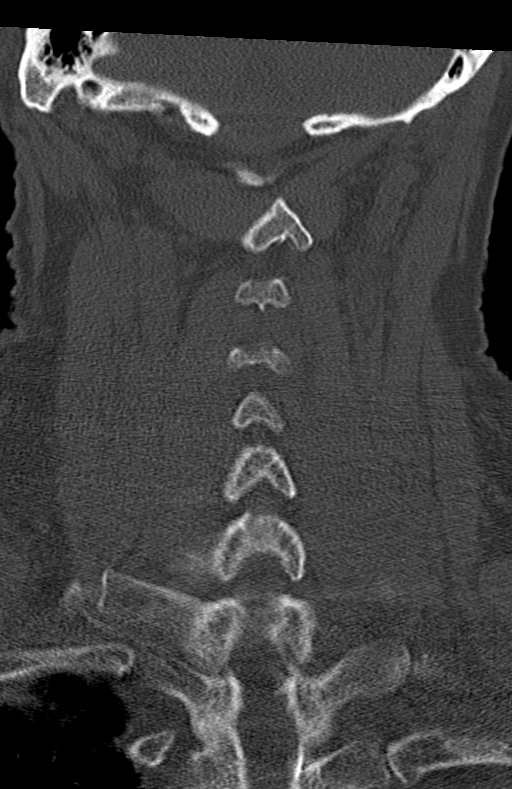
[im 56/60  bone]
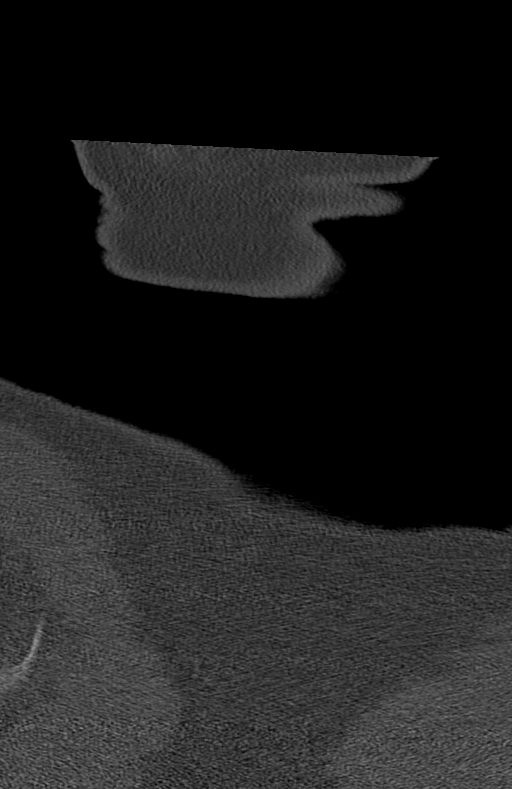

[Series 8: orthogonal bone · axial · 0.23mm/px · z∈[-277,-217]mm · 2 of 105 slices shown, 3 images (1 of 2)]
[im 35/105  soft-tissue]
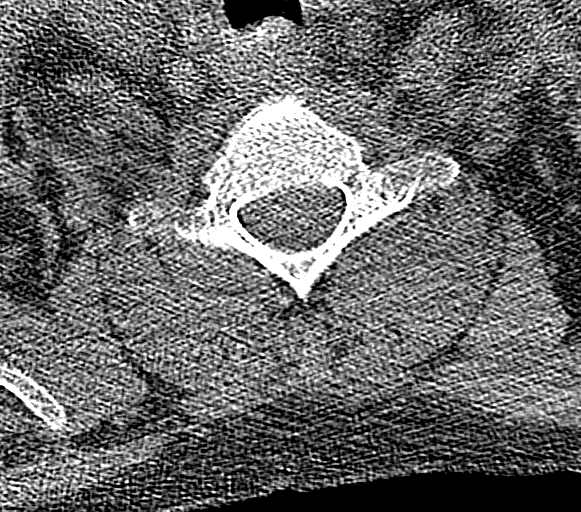
[im 35/105  bone]
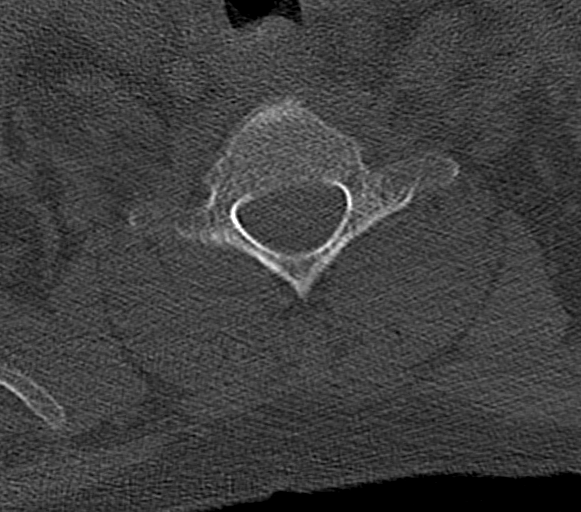
[im 70/105  bone]
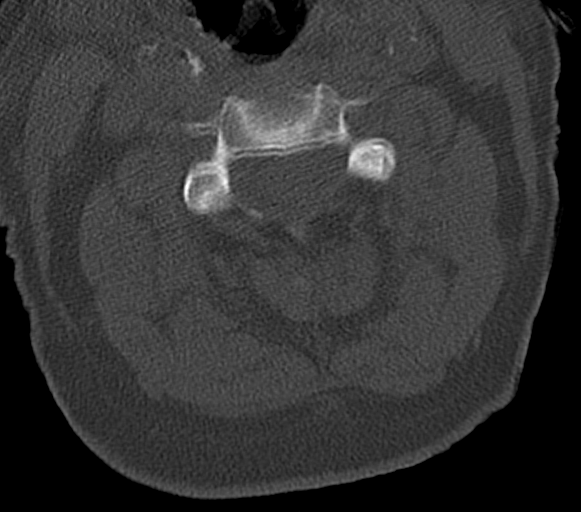

[Series 15: orthogonal bone · axial · 0.23mm/px · 1 of 102 slices shown (2 of 2)]
[im 34/102  bone]
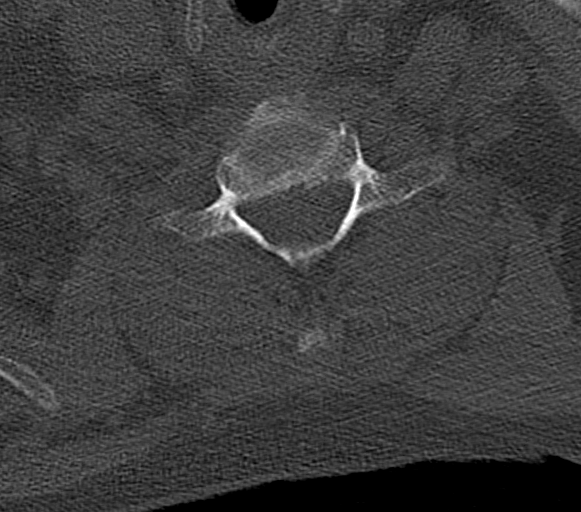

[11 of 33 positions shown; findings below may reference images not displayed]

FINDINGS: CT HEAD FINDINGS

Brain: Moderate atrophy and white matter changes are stable. No
acute infarct, hemorrhage, or mass lesion is present. Basal ganglia
within normal limits. Insular ribbon is normal bilaterally. The
ventricles are proportionate to the degree of atrophy. No
significant extraaxial fluid collection is present. The brainstem
and cerebellum are within normal limits.

Vascular: Atherosclerotic calcifications are present within the
cavernous internal carotid arteries bilaterally. No hyperdense
vessel is present.

Skull: Left frontal scalp soft tissue swelling is present. No
underlying fracture is present.

Sinuses/Orbits: The paranasal sinuses and mastoid air cells are
clear. Chronic wall thickening is present in the maxillary sinuses
bilaterally without active disease. The globes and orbits are within
normal limits.

CT CERVICAL SPINE FINDINGS

Alignment: Slight degenerative retrolisthesis is present C3-4.
Minimal degenerative anterolisthesis is present at C4-5. Study is
degraded by patient motion in the upper cervical spine. Repeat
imaging was done with motion in the same spot.

Skull base and vertebrae: Craniocervical junction is normal.
Degenerative changes are present C1-2. Vertebral body heights are
maintained. No acute or healing fractures are present.

Soft tissues and spinal canal: No prevertebral fluid or swelling. No
visible canal hematoma. Atherosclerotic calcifications are present
of the carotid bifurcations bilaterally.

Disc levels: A broad-based disc osteophyte complex and bilateral
facet hypertrophy is present at C3-4. This results in central canal
narrowing with moderate left and mild right foraminal stenosis.

Uncovertebral spurring is present bilaterally at C4-5 with right
greater than left foraminal narrowing.

Upper chest: Centrilobular emphysematous changes are present. No
focal nodule, mass, or airspace disease is present. Thoracic inlet
is within normal limits.
IMPRESSION: 1. Left frontal scalp soft tissue swelling without underlying
fracture.
2. Stable atrophy and white matter disease. This likely reflects the
sequela of chronic microvascular ischemia.
3. No acute intracranial abnormality or significant interval change.
4. Multilevel degenerative changes of the cervical spine as
described.
5. No acute or healing fractures of the cervical spine.
6. Emphysema ([id number]).

## 2021-08-19 DEATH — deceased
